# Patient Record
Sex: Male | Born: 1935 | Hispanic: No | Marital: Married | Smoking: Former smoker
Health system: Southern US, Community
[De-identification: ages and names within clinical notes are randomized; demographics above are authoritative.]

## PROBLEM LIST (undated history)

## (undated) DIAGNOSIS — R519 Headache, unspecified: Secondary | ICD-10-CM

## (undated) DIAGNOSIS — J189 Pneumonia, unspecified organism: Secondary | ICD-10-CM

## (undated) DIAGNOSIS — D649 Anemia, unspecified: Secondary | ICD-10-CM

## (undated) DIAGNOSIS — I2510 Atherosclerotic heart disease of native coronary artery without angina pectoris: Secondary | ICD-10-CM

## (undated) DIAGNOSIS — N400 Enlarged prostate without lower urinary tract symptoms: Secondary | ICD-10-CM

## (undated) DIAGNOSIS — M1990 Unspecified osteoarthritis, unspecified site: Secondary | ICD-10-CM

## (undated) DIAGNOSIS — N189 Chronic kidney disease, unspecified: Secondary | ICD-10-CM

## (undated) DIAGNOSIS — C801 Malignant (primary) neoplasm, unspecified: Secondary | ICD-10-CM

## (undated) DIAGNOSIS — I10 Essential (primary) hypertension: Secondary | ICD-10-CM

## (undated) DIAGNOSIS — R51 Headache: Secondary | ICD-10-CM

## (undated) HISTORY — DX: Anemia, unspecified: D64.9

## (undated) HISTORY — DX: Chronic kidney disease, unspecified: N18.9

## (undated) HISTORY — PX: SHOULDER SURGERY: SHX246

## (undated) HISTORY — PX: COLONOSCOPY W/ BIOPSIES AND POLYPECTOMY: SHX1376

## (undated) HISTORY — PX: TONSILLECTOMY: SUR1361

---

## 1999-07-10 ENCOUNTER — Ambulatory Visit (HOSPITAL_COMMUNITY): Admission: RE | Admit: 1999-07-10 | Discharge: 1999-07-10 | Payer: Self-pay | Admitting: Gastroenterology

## 2000-03-17 ENCOUNTER — Inpatient Hospital Stay (HOSPITAL_COMMUNITY): Admission: EM | Admit: 2000-03-17 | Discharge: 2000-03-20 | Payer: Self-pay | Admitting: Emergency Medicine

## 2000-03-17 ENCOUNTER — Encounter: Payer: Self-pay | Admitting: Emergency Medicine

## 2000-03-18 ENCOUNTER — Encounter: Payer: Self-pay | Admitting: Internal Medicine

## 2000-03-19 ENCOUNTER — Encounter: Payer: Self-pay | Admitting: Internal Medicine

## 2005-08-21 ENCOUNTER — Emergency Department (HOSPITAL_COMMUNITY): Admission: EM | Admit: 2005-08-21 | Discharge: 2005-08-21 | Payer: Self-pay | Admitting: Emergency Medicine

## 2005-08-21 ENCOUNTER — Encounter (INDEPENDENT_AMBULATORY_CARE_PROVIDER_SITE_OTHER): Payer: Self-pay | Admitting: Specialist

## 2005-08-21 ENCOUNTER — Ambulatory Visit (HOSPITAL_COMMUNITY): Admission: RE | Admit: 2005-08-21 | Discharge: 2005-08-21 | Payer: Self-pay | Admitting: General Surgery

## 2006-01-05 ENCOUNTER — Ambulatory Visit (HOSPITAL_COMMUNITY): Admission: RE | Admit: 2006-01-05 | Discharge: 2006-01-05 | Payer: Self-pay | Admitting: Gastroenterology

## 2006-01-05 ENCOUNTER — Encounter (INDEPENDENT_AMBULATORY_CARE_PROVIDER_SITE_OTHER): Payer: Self-pay | Admitting: *Deleted

## 2009-07-16 ENCOUNTER — Emergency Department (HOSPITAL_COMMUNITY): Admission: EM | Admit: 2009-07-16 | Discharge: 2009-07-16 | Payer: Self-pay | Admitting: Emergency Medicine

## 2010-10-21 LAB — COMPREHENSIVE METABOLIC PANEL
AST: 23 U/L (ref 0–37)
CO2: 25 mEq/L (ref 19–32)
Calcium: 9 mg/dL (ref 8.4–10.5)
Creatinine, Ser: 1.84 mg/dL — ABNORMAL HIGH (ref 0.4–1.5)
GFR calc Af Amer: 44 mL/min — ABNORMAL LOW (ref >60)
GFR calc non Af Amer: 36 mL/min — ABNORMAL LOW (ref >60)

## 2010-10-21 LAB — DIFFERENTIAL
Eosinophils Absolute: 0.5 10*3/uL (ref 0.0–0.7)
Eosinophils Relative: 3 % (ref 0–5)
Monocytes Absolute: 0.5 10*3/uL (ref 0.1–1.0)
Monocytes Relative: 3 % (ref 3–12)
Neutro Abs: 7.5 10*3/uL (ref 1.7–7.7)
Neutrophils Relative %: 45 % (ref 43–77)
nRBC: 0 /100 WBC

## 2010-10-21 LAB — URINALYSIS, ROUTINE W REFLEX MICROSCOPIC
Bilirubin Urine: NEGATIVE
Hgb urine dipstick: NEGATIVE
Ketones, ur: NEGATIVE mg/dL
Protein, ur: NEGATIVE mg/dL
Specific Gravity, Urine: 1.024 (ref 1.005–1.030)
Urobilinogen, UA: 1 mg/dL (ref 0.0–1.0)

## 2010-10-21 LAB — CBC
MCHC: 34 g/dL (ref 30.0–36.0)
MCV: 92.2 fL (ref 78.0–100.0)
RBC: 4.14 MIL/uL — ABNORMAL LOW (ref 4.22–5.81)
RDW: 13.4 % (ref 11.5–15.5)

## 2010-10-21 LAB — GLUCOSE, CAPILLARY: Glucose-Capillary: 176 mg/dL — ABNORMAL HIGH (ref 70–99)

## 2010-12-06 NOTE — Op Note (Signed)
NAME:  Ray Merritt, RACKHAM                ACCOUNT NO.:  [id number]   MEDICAL RECORD NO.:  [id number]          PATIENT TYPE:  AMB   LOCATION:  ENDO                         FACILITY:  [facility]   PHYSICIAN:  Nelwyn Salisbury, M.D.  DATE OF BIRTH:  May 26, 1936   DATE OF PROCEDURE:  01/05/2006  DATE OF DISCHARGE:                                 OPERATIVE REPORT   PROCEDURE PERFORMED:  Flexible sigmoidoscopy with multiple cold biopsies.   ENDOSCOPIST:  Nelwyn Salisbury, M.D.   INSTRUMENT USED:  Olympus video colonoscope.   INDICATIONS FOR PROCEDURE:  The patient is a 75 year old African-American  male with a history of transanal excision of a rectal mass with high grade  dysplasia undergoing surveillance flexible sigmoidoscopy.  The lesion was  removed in February of 2007.  Rule out colonic polyps, masses, etc.   PREPROCEDURE PREPARATION:  Informed consent was procured from the patient.  The patient was fasted for four hours prior to the procedure and prepped  with MiraLax the night prior to the procedure.  The risks and benefits were  discussed with the patient in great detail.   PREPROCEDURE PHYSICAL:  The patient had stable vital signs.  Neck supple.  Chest clear to auscultation. S1 and S2 regular.  Abdomen soft with normal  bowel sounds.   DESCRIPTION OF PROCEDURE:  The patient was placed in left lateral decubitus  position and sedated with 40 mcg of fentanyl and 4 mg of Versed in slow  incremental doses.  Once the patient was adequately sedated and maintained  on low flow oxygen and continuous cardiac monitoring, the Olympus video  colonoscope was advanced from the rectum to 80 cm without difficulty.  There  was some residual stool in the colon and multiple washes were done.  A well-  healed scar was noted in the rectum without any evidence of recurrence of  the lesion removed in the past.  four small sessile polyps were biopsied  from the rectum and four from the rectosigmoid colon by cold  biopsy.  Internal hemorrhoids were seen on retroflexion.  The rest of the exam was  unremarkable.  The patient tolerated the procedure well without  complication.   IMPRESSION:  1.Small internal hemorrhoids.  2.Four small sessile polyps biopsied from the rectum and four from the  rectosigmoid colon.  3.Otherwise normal exam up to 80 cm.   RECOMMENDATIONS:  1.Await pathology results.  2.Avoid all nonsteroidals including aspirin for the next two weeks.  3.Outpatient followup as need arises in the future.  4.Repeat colonoscopy will be planned depending on the biopsy results.      Nelwyn Salisbury, M.D.  Electronically Signed     JNM/MEDQ  D:  01/05/2006  T:  01/06/2006  Job:  [id number]   cc:   Royetta Crochet. Karlton Lemon, M.D.  Fax: [phone]   Odis Hollingshead, M.D.  [address], [place]  [place] [address]

## 2010-12-06 NOTE — H&P (Signed)
[name]  Patient:    Ray Merritt, Ray Merritt                        MRN: [id number] Adm. Date:  03/17/00 Attending:  Royetta Crochet. Karlton Lemon, M.D.                         History and Physical  CHIEF COMPLAINT: Shortness of breath, fever.  HISTORY OF PRESENT ILLNESS: Mr. Ray Merritt is a 75 year old admitted with a one week history of progressively worsening shortness of breath with a productive cough.  He noted a fever today up to 102 degrees and has also been having nausea and vomiting with diarrhea.  When he presented to the emergency department he was noted to be markedly diaphoretic with a temperature of 101 degrees and also dyspneic, with oxygen saturation in the low 90s.  His pO2, however, is 61, revealing hypoxia.  He also complains of mid sternal chest pressure, but denies palpitations.  He has no other acute constitutional or systemic complaints at this time.  ALLERGIES: No known drug allergies.  MEDICATIONS:  1. K-Dur 20 mEq 1 p.o. q.d.  2. Norvasc 5 mg 1 p.o. q.d.  3. Enalapril 10 mg 1 p.o. q.d.  4. Actos 30 mg 1 p.o. q.d.  5. Lipitor 10 mg 1 p.o. q.d.  6. Glucophage 1 g p.o. q.d.  7. Lasix 20 mg 1 p.o. q.d.  PAST MEDICAL HISTORY:  1. Hypertension.  2. Type 2 diabetes mellitus.  3. Hyperlipidemia.  SOCIAL HISTORY: Mr. Ray Merritt is married and lives in [place], [place]. He denies use of tobacco, alcohol, or drug abuse.  FAMILY HISTORY: Significant for hypertension and diabetes.  REVIEW OF SYSTEMS: Significant for progressively worsening left shoulder pain associated with left hand numbness and tingling.  PHYSICAL EXAMINATION:  GENERAL: Diaphoretic black gentleman, exhibiting difficulty breathing.  VITAL SIGNS: Temperature 101.3 degrees, blood pressure 164/76.  Oxygen saturation 98%.  HEENT: TMs within normal limits bilaterally.  Oropharynx with erythema but no lesions or exudate.  NECK: Supple, no masses.  Carotids 2+.  No  bruits.  LUNGS: Good air movement.  Clear to auscultation bilaterally.  HEART: S1 and S2.  Regular rate and rhythm with no murmurs, rubs, or gallops.  ABDOMEN: Soft, nontender, nondistended.  Positive bowel sounds.  EXTREMITIES: No clubbing, cyanosis, or edema.  Pulses 2+ throughout.  NEUROLOGIC: Alert and oriented x 3.  Cranial nerves intact.  LABORATORY DATA: Sodium 128, potassium 4.4, glucose 256.  BUN 32, creatinine 2.0.  Albumin 3.2.  SGOT 40, SGPT 50.  Troponin I 0.06.  CK/CK-MB 235/0.8. WBC 8.2, hemoglobin 12.0.  Chest x-ray unremarkable.  Urinalysis unremarkable.  ASSESSMENT/PLAN:  1. Shortness of breath with chest pain, hypoxia, and fever.  Mr. Rai     clinical presentation is consistent with pneumonia, although chest x-ray     is unremarkable.  Other concerns include pulmonary embolism considering     his pO2 is markedly decreased.  Since he has renal insufficiency a V/Q     scan will be obtained to rule out the possibility of pulmonary embolus.     At this time, however, he will continue nasal cannula oxygen     supplementation, intravenous antibiotics for pneumonia coverage, and     albuterol nebulizers, which hopefully will help improve his symptoms of     shortness of breath.  2. Mediastinal chest pain.  Mr. Sanangelo has significant cardiac risk  factors     including hyperlipidemia, hypertension, type 2 diabetes mellitus.  Initial     cardiac enzymes are unremarkable; however, the possibility of cardiac     etiology to explain his symptoms cannot be ruled out and therefore serial     troponin enzymes will be obtained.  Electrocardiogram today shows no     acute ischemic changes.  He will be closely monitored on telemetry as     well.  3. Anemia.  Mr. Toomes has no acute blood loss and denies any chronic     blood loss as well.  His stool will be guaiac tested during this     admission and if he needs further gastrointestinal consultation that     will be  pursued if needed. DD:  03/17/00 TD:  03/17/00 Job: [id number]

## 2010-12-06 NOTE — Op Note (Signed)
NAME:  Ray Merritt, Ray Merritt                ACCOUNT NO.:  [id number]   MEDICAL RECORD NO.:  [id number]          PATIENT TYPE:  AMB   LOCATION:  DAY                          [facility]ACILITY:  [facility]   PHYSICIAN:  Odis Hollingshead, M.D.DATE OF BIRTH:  1935-10-25   DATE OF PROCEDURE:  08/21/2005  DATE OF DISCHARGE:                                 OPERATIVE REPORT   PREOPERATIVE DIAGNOSIS:  Highly dysplastic rectal polyps.   POSTOPERATIVE DIAGNOSIS:  Highly dysplastic rectal polyps.   PROCEDURE:  Transanal excision of highly dysplastic rectal polyps (anterior  and posterior polyps).   SURGEON:  Dr. Zella Richer   ANESTHESIA:  General plus 0.5% Marcaine with epinephrine for local effect.   INDICATIONS:  This 75 year old male underwent colonoscopy and was noted has  some rectal polyps and irregularities in the rectal region.  The biopsy of  these showed some hyperplastic polyps but also adenomatous-type polyp with  highly dysplastic changes.  He has a palpable posterior polyp on examination  another polypoid area approximately 5 cm from the anal verge.  He now  presents for excision of those areas.   TECHNIQUE:  He is seen in the holding area then brought to the operating  room, placed on the operating table, and a general anesthetic was  administered.  He is then placed in the lithotomy position, and the perianal  area was sterilely prepped and draped.  Digital rectal exam confirmed the  posterior polyp.  An anoscope was inserted, exposing the posterior anal and  rectal mucosa.  The polyp was grasped, and then local anesthetic was  infiltrated in these tissue around the polyp.  Using electrocautery, the  posterior polyp was excised and sent to pathology.   Anoscopy was then performed of all of the areas, and I saw an anterior  irregularity consistent with a polypoid-type lesion.  I used electrocautery  to excise this mucosal lesion.  In examining the rest of the area by way of  anoscopy, I could  not see anything else that was suspicious.  The specimens  were sent separately to pathology.   Following this, hemostasis was noted be adequate.  Some Gelfoam was placed  into the rectal vault.  A sterile dressing was placed over the anal area.  He subsequently was extubated and taken to the recovery room in satisfactory  condition with no apparent complications.  He will be discharged to home  with discharge instructions and follow-up with me in the office in about 1-2  weeks.      Odis Hollingshead, M.D.  Electronically Signed     TJR/MEDQ  D:  08/21/2005  T:  08/21/2005  Job:  [id number]   cc:   Nelwyn Salisbury, M.D.  Fax: [phone]   [place] Karlton Lemon, M.D.  Fax: [phone]

## 2011-08-05 DIAGNOSIS — H4011X0 Primary open-angle glaucoma, stage unspecified: Secondary | ICD-10-CM | POA: Diagnosis not present

## 2011-08-05 DIAGNOSIS — H401290 Low-tension glaucoma, unspecified eye, stage unspecified: Secondary | ICD-10-CM | POA: Diagnosis not present

## 2011-08-21 DIAGNOSIS — N2581 Secondary hyperparathyroidism of renal origin: Secondary | ICD-10-CM | POA: Diagnosis not present

## 2011-09-08 DIAGNOSIS — E1129 Type 2 diabetes mellitus with other diabetic kidney complication: Secondary | ICD-10-CM | POA: Diagnosis not present

## 2011-09-08 DIAGNOSIS — Z79899 Other long term (current) drug therapy: Secondary | ICD-10-CM | POA: Diagnosis not present

## 2011-09-08 DIAGNOSIS — I10 Essential (primary) hypertension: Secondary | ICD-10-CM | POA: Diagnosis not present

## 2011-09-30 DIAGNOSIS — R310 Gross hematuria: Secondary | ICD-10-CM | POA: Diagnosis not present

## 2011-09-30 DIAGNOSIS — K5730 Diverticulosis of large intestine without perforation or abscess without bleeding: Secondary | ICD-10-CM | POA: Diagnosis not present

## 2011-09-30 DIAGNOSIS — N400 Enlarged prostate without lower urinary tract symptoms: Secondary | ICD-10-CM | POA: Diagnosis not present

## 2011-09-30 DIAGNOSIS — R972 Elevated prostate specific antigen [PSA]: Secondary | ICD-10-CM | POA: Diagnosis not present

## 2011-09-30 DIAGNOSIS — N401 Enlarged prostate with lower urinary tract symptoms: Secondary | ICD-10-CM | POA: Diagnosis not present

## 2011-09-30 DIAGNOSIS — I2510 Atherosclerotic heart disease of native coronary artery without angina pectoris: Secondary | ICD-10-CM | POA: Diagnosis not present

## 2011-10-07 ENCOUNTER — Emergency Department (HOSPITAL_COMMUNITY)
Admission: EM | Admit: 2011-10-07 | Discharge: 2011-10-07 | Disposition: A | Discharge status: Home / Self Care | Payer: Medicare Other | Attending: Emergency Medicine | Admitting: Emergency Medicine

## 2011-10-07 ENCOUNTER — Encounter (HOSPITAL_COMMUNITY): Payer: Self-pay | Admitting: *Deleted

## 2011-10-07 DIAGNOSIS — R339 Retention of urine, unspecified: Secondary | ICD-10-CM | POA: Diagnosis not present

## 2011-10-07 DIAGNOSIS — R3915 Urgency of urination: Secondary | ICD-10-CM | POA: Insufficient documentation

## 2011-10-07 DIAGNOSIS — R109 Unspecified abdominal pain: Secondary | ICD-10-CM | POA: Diagnosis not present

## 2011-10-07 HISTORY — DX: Benign prostatic hyperplasia without lower urinary tract symptoms: N40.0

## 2011-10-07 HISTORY — DX: Essential (primary) hypertension: I10

## 2011-10-07 LAB — URINE MICROSCOPIC-ADD ON

## 2011-10-07 LAB — URINALYSIS, ROUTINE W REFLEX MICROSCOPIC
Bilirubin Urine: NEGATIVE
Specific Gravity, Urine: 1.019 (ref 1.005–1.030)
pH: 5 (ref 5.0–8.0)

## 2011-10-07 MED ORDER — CIPROFLOXACIN HCL 250 MG PO TABS: 250.0000 mg | ORAL_TABLET | Freq: Two times a day (BID) | ORAL | Status: AC

## 2011-10-07 NOTE — ED Notes (Signed)
Pt reports urinary retention x 12 hours. Reports pressure in lower abdomen and pain to back. Denies nausea/vomiting.

## 2011-10-07 NOTE — Discharge Instructions (Signed)
Followup with your urologist tomorrow  Acute Urinary Retention, Male You have been seen by a caregiver today because of your inability to urinate (pass your water). This is a common problem in elderly males. As men age their prostates become larger and block the flow of urine from the bladder. This is usually a problem that has come on gradually. It is often first noticed by having to get up at night to urinate. This is because as the prostate enlarges it is more difficult to empty the bladder completely. Treatment may involve a one time catheterization to empty the bladder. This is putting in a tube to drain your urine. Then you and your personal caregiver can decide at your earliest convenience how to handle this problem in the future. It may also be a problem that may not recur for years. Sometimes this problem can be caused by medications. In this case, all that is often necessary is to discontinue the offending agent. If you are to leave the foley catheter (a long, narrow, hollow tube) in and go home with a drainage system, you will need to discuss the best course of action with your caregiver. While the catheter is in, maintain a good intake of fluids. Keep the drainage bag emptied and lower than your catheter. This is so contaminated (infected) urine will not be flowing back into your bladder. This could lead to a urinary tract infection. Only take over-the-counter or prescription medicines for pain, discomfort, or fever as directed by your caregiver.  SEEK IMMEDIATE MEDICAL CARE IF:  You develop chills, fever, or show signs of generalized illness that occurs prior to seeing your caregiver. Document Released: 10/13/2000 Document Revised: 06/26/2011 Document Reviewed: 06/28/2008 [facility] Patient Information 2012 [place], [place].Foley Catheter Care, Adult A soft, flexible tube (Foley catheter) has been placed in your bladder. This may be done to temporarily help with urine drainage after an  operation or to relieve blockage from an enlarged prostate gland. HOME CARE INSTRUCTIONS  If you are going home with a Foley catheter in place, follow these instructions: Taking Care of the Catheter:  Keep the area where the catheter leaves your body clean.   Attach the catheter to the leg so there is no tension on the catheter.   Keep the drainage bag below the level of the bladder, but keep it OFF the floor.   Do not take long soaking baths. Your caregiver will give instructions about showering.   Wash your hands before touching ANYTHING related to the catheter or bag.   Using mild soap and warm water on a washcloth:   Clean the area closest to the catheter insertion site using a circular motion around the catheter.   Clean the catheter itself by wiping AWAY from the insertion site for several inches down the tube.   NEVER wipe upward as this could sweep bacteria up into the urethra (tube in your body that normally drains the bladder) and cause infection.  Taking Care of the Drainage Bags:  Two drainage bags will be taken home: a large overnight drainage bag, and a smaller leg bag which fits underneath clothing.   It is okay to wear the overnight bag at any time, but NEVER wear the smaller leg bag at night.   Keep the drainage bag well below the level of your bladder. This prevents backflow of urine into the bladder and allows the urine to drain freely.   Anchor the tubing to your leg to prevent pulling or tension on  the catheter. Use tape or a leg strap provided by the hospital.   Empty the drainage bag when it is  to  full. Wash your hands before and after touching the bag.   Periodically check the tubing for kinks to make sure there is no pressure on the tubing which could restrict the flow of urine.  Changing the Drainage Bags:  Cleanse both ends of the clean bag with alcohol before changing.   Pinch off the rubber catheter to avoid urine spillage during the  disconnection.   Disconnect the dirty bag and connect the clean one.   Empty the dirty bag carefully to avoid a urine spill.   Attach the new bag to the leg with tape or a leg strap.  Cleaning the Drainage Bags:  Whenever a drainage bag is disconnected, it must be cleaned quickly so it is ready for the next use.   Wash the bag in warm, soapy water.   Rinse the bag thoroughly with warm water.   Soak the bag for 30 minutes in a solution of white vinegar and water (1 cup vinegar to 1 quart warm water).   Rinse with warm water.  SEEK MEDICAL CARE IF:   Some pain develops in the kidney (lower back) area.   The urine is cloudy or smells bad.   There is some blood in the urine.   The catheter becomes clogged and/or there is no urine drainage.  SEEK IMMEDIATE MEDICAL CARE IF:   You have moderate or severe pain in the kidney region.   You start to throw up (vomit).   Blood fills the tube.   Worsening belly (abdominal) pain develops.   You have a fever.  MAKE SURE YOU:   Understand these instructions.   Will watch your condition.   Will get help right away if you are not doing well or get worse.  Document Released: 07/07/2005 Document Revised: 06/26/2011 Document Reviewed: 01/01/2007 [facility] Patient Information 2012 [place], [place].

## 2011-10-07 NOTE — ED Provider Notes (Signed)
History     CSN: [id number]  Arrival date & time 10/07/11  1138   First MD Initiated Contact with Patient 10/07/11 1141      No chief complaint on file.   (Consider location/radiation/quality/duration/timing/severity/associated sxs/prior treatment) The history is provided by the patient and the spouse.   patient here complaining of urinary retention since 11:00 last night. Patient has had some urinary urgency with small amounts of urine noted. Denies any fever or back pain. No penile drainage or discharge. Does have a history of BPH and has had similar symptoms. No therapy done prior to arrival. Location of pain is suprapubic and described as pressure  No past medical history on file.  No past surgical history on file.  No family history on file.  History  Substance Use Topics  . Smoking status: Not on file  . Smokeless tobacco: Not on file  . Alcohol Use: Not on file      Review of Systems  All other systems reviewed and are negative.    Allergies  Review of patient's allergies indicates not on file.  Home Medications  No current outpatient prescriptions on file.  There were no vitals taken for this visit.  Physical Exam  Nursing note and vitals reviewed. Constitutional: He is oriented to person, place, and time. He appears well-developed and well-nourished.  Non-toxic appearance. No distress.  HENT:  Head: Normocephalic and atraumatic.  Eyes: Conjunctivae, EOM and lids are normal. Pupils are equal, round, and reactive to light.  Neck: Normal range of motion. Neck supple. No tracheal deviation present. No mass present.  Cardiovascular: Normal rate, regular rhythm and normal heart sounds.  Exam reveals no gallop.   No murmur heard. Pulmonary/Chest: Effort normal and breath sounds normal. No stridor. No respiratory distress. He has no decreased breath sounds. He has no wheezes. He has no rhonchi. He has no rales.  Abdominal: Soft. Normal appearance and bowel  sounds are normal. He exhibits no distension. There is tenderness in the suprapubic area. There is no rigidity, no rebound, no guarding and no CVA tenderness.  Musculoskeletal: Normal range of motion. He exhibits no edema and no tenderness.  Neurological: He is alert and oriented to person, place, and time. He has normal strength. No cranial nerve deficit or sensory deficit. GCS eye subscore is 4. GCS verbal subscore is 5. GCS motor subscore is 6.  Skin: Skin is warm and dry. No abrasion and no rash noted.  Psychiatric: He has a normal mood and affect. His speech is normal and behavior is normal.    ED Course  Procedures (including critical care time)   Labs Reviewed  URINALYSIS, ROUTINE W REFLEX MICROSCOPIC  URINE CULTURE   No results found.   No diagnosis found.    MDM  Patient had a Foley catheter placed by nursing and patient drained 800 cc of urine. Patient be treated for urinary tract infection and will see his urologist TOMORROW        Leota Jacobsen, MD 10/07/11 1425

## 2011-10-09 LAB — URINE CULTURE: Culture  Setup Time: 201303191404

## 2011-10-10 NOTE — ED Notes (Signed)
+   Urine -3,0000 Col[place] no need for treatment per protocol MD.

## 2011-10-13 DIAGNOSIS — R310 Gross hematuria: Secondary | ICD-10-CM | POA: Diagnosis not present

## 2011-10-13 DIAGNOSIS — N401 Enlarged prostate with lower urinary tract symptoms: Secondary | ICD-10-CM | POA: Diagnosis not present

## 2011-10-15 DIAGNOSIS — E7800 Pure hypercholesterolemia, unspecified: Secondary | ICD-10-CM | POA: Diagnosis not present

## 2011-10-15 DIAGNOSIS — R0989 Other specified symptoms and signs involving the circulatory and respiratory systems: Secondary | ICD-10-CM | POA: Diagnosis not present

## 2011-10-15 DIAGNOSIS — I2510 Atherosclerotic heart disease of native coronary artery without angina pectoris: Secondary | ICD-10-CM | POA: Diagnosis not present

## 2011-10-15 DIAGNOSIS — E1169 Type 2 diabetes mellitus with other specified complication: Secondary | ICD-10-CM | POA: Diagnosis not present

## 2011-10-20 DIAGNOSIS — E1169 Type 2 diabetes mellitus with other specified complication: Secondary | ICD-10-CM | POA: Diagnosis not present

## 2011-10-20 DIAGNOSIS — I2510 Atherosclerotic heart disease of native coronary artery without angina pectoris: Secondary | ICD-10-CM | POA: Diagnosis not present

## 2011-10-31 DIAGNOSIS — I2510 Atherosclerotic heart disease of native coronary artery without angina pectoris: Secondary | ICD-10-CM | POA: Diagnosis not present

## 2011-10-31 DIAGNOSIS — E1169 Type 2 diabetes mellitus with other specified complication: Secondary | ICD-10-CM | POA: Diagnosis not present

## 2011-11-03 DIAGNOSIS — H4011X0 Primary open-angle glaucoma, stage unspecified: Secondary | ICD-10-CM | POA: Diagnosis not present

## 2011-11-07 DIAGNOSIS — I2510 Atherosclerotic heart disease of native coronary artery without angina pectoris: Secondary | ICD-10-CM | POA: Diagnosis not present

## 2011-11-07 DIAGNOSIS — E7800 Pure hypercholesterolemia, unspecified: Secondary | ICD-10-CM | POA: Diagnosis not present

## 2011-11-07 DIAGNOSIS — E1169 Type 2 diabetes mellitus with other specified complication: Secondary | ICD-10-CM | POA: Diagnosis not present

## 2011-11-07 DIAGNOSIS — R0989 Other specified symptoms and signs involving the circulatory and respiratory systems: Secondary | ICD-10-CM | POA: Diagnosis not present

## 2011-12-09 DIAGNOSIS — I10 Essential (primary) hypertension: Secondary | ICD-10-CM | POA: Diagnosis not present

## 2011-12-09 DIAGNOSIS — E1165 Type 2 diabetes mellitus with hyperglycemia: Secondary | ICD-10-CM | POA: Diagnosis not present

## 2011-12-09 DIAGNOSIS — E785 Hyperlipidemia, unspecified: Secondary | ICD-10-CM | POA: Diagnosis not present

## 2011-12-09 DIAGNOSIS — Z79899 Other long term (current) drug therapy: Secondary | ICD-10-CM | POA: Diagnosis not present

## 2011-12-09 DIAGNOSIS — E1129 Type 2 diabetes mellitus with other diabetic kidney complication: Secondary | ICD-10-CM | POA: Diagnosis not present

## 2012-02-02 DIAGNOSIS — H4011X0 Primary open-angle glaucoma, stage unspecified: Secondary | ICD-10-CM | POA: Diagnosis not present

## 2012-04-15 DIAGNOSIS — R972 Elevated prostate specific antigen [PSA]: Secondary | ICD-10-CM | POA: Diagnosis not present

## 2012-04-15 DIAGNOSIS — N401 Enlarged prostate with lower urinary tract symptoms: Secondary | ICD-10-CM | POA: Diagnosis not present

## 2012-05-06 DIAGNOSIS — H4011X0 Primary open-angle glaucoma, stage unspecified: Secondary | ICD-10-CM | POA: Diagnosis not present

## 2012-05-11 DIAGNOSIS — E1129 Type 2 diabetes mellitus with other diabetic kidney complication: Secondary | ICD-10-CM | POA: Diagnosis not present

## 2012-05-11 DIAGNOSIS — E785 Hyperlipidemia, unspecified: Secondary | ICD-10-CM | POA: Diagnosis not present

## 2012-05-11 DIAGNOSIS — E559 Vitamin D deficiency, unspecified: Secondary | ICD-10-CM | POA: Diagnosis not present

## 2012-05-11 DIAGNOSIS — Z23 Encounter for immunization: Secondary | ICD-10-CM | POA: Diagnosis not present

## 2012-05-11 DIAGNOSIS — I10 Essential (primary) hypertension: Secondary | ICD-10-CM | POA: Diagnosis not present

## 2012-05-11 DIAGNOSIS — Z0000 Encounter for general adult medical examination without abnormal findings: Secondary | ICD-10-CM | POA: Diagnosis not present

## 2012-05-11 DIAGNOSIS — E1165 Type 2 diabetes mellitus with hyperglycemia: Secondary | ICD-10-CM | POA: Diagnosis not present

## 2012-06-01 DIAGNOSIS — E119 Type 2 diabetes mellitus without complications: Secondary | ICD-10-CM | POA: Diagnosis not present

## 2012-06-01 DIAGNOSIS — I10 Essential (primary) hypertension: Secondary | ICD-10-CM | POA: Diagnosis not present

## 2012-06-01 DIAGNOSIS — I2510 Atherosclerotic heart disease of native coronary artery without angina pectoris: Secondary | ICD-10-CM | POA: Diagnosis not present

## 2012-06-01 DIAGNOSIS — R0989 Other specified symptoms and signs involving the circulatory and respiratory systems: Secondary | ICD-10-CM | POA: Diagnosis not present

## 2012-11-02 DIAGNOSIS — H4011X0 Primary open-angle glaucoma, stage unspecified: Secondary | ICD-10-CM | POA: Diagnosis not present

## 2012-11-09 DIAGNOSIS — K625 Hemorrhage of anus and rectum: Secondary | ICD-10-CM | POA: Diagnosis not present

## 2012-11-09 DIAGNOSIS — I10 Essential (primary) hypertension: Secondary | ICD-10-CM | POA: Diagnosis not present

## 2012-11-09 DIAGNOSIS — E1165 Type 2 diabetes mellitus with hyperglycemia: Secondary | ICD-10-CM | POA: Diagnosis not present

## 2012-11-09 DIAGNOSIS — E785 Hyperlipidemia, unspecified: Secondary | ICD-10-CM | POA: Diagnosis not present

## 2012-11-09 DIAGNOSIS — Z79899 Other long term (current) drug therapy: Secondary | ICD-10-CM | POA: Diagnosis not present

## 2013-04-19 DIAGNOSIS — R972 Elevated prostate specific antigen [PSA]: Secondary | ICD-10-CM | POA: Diagnosis not present

## 2013-04-26 DIAGNOSIS — H4011X0 Primary open-angle glaucoma, stage unspecified: Secondary | ICD-10-CM | POA: Diagnosis not present

## 2013-06-07 DIAGNOSIS — Z0000 Encounter for general adult medical examination without abnormal findings: Secondary | ICD-10-CM | POA: Diagnosis not present

## 2013-06-07 DIAGNOSIS — I129 Hypertensive chronic kidney disease with stage 1 through stage 4 chronic kidney disease, or unspecified chronic kidney disease: Secondary | ICD-10-CM | POA: Diagnosis not present

## 2013-06-07 DIAGNOSIS — E669 Obesity, unspecified: Secondary | ICD-10-CM | POA: Diagnosis not present

## 2013-06-07 DIAGNOSIS — E785 Hyperlipidemia, unspecified: Secondary | ICD-10-CM | POA: Diagnosis not present

## 2013-06-07 DIAGNOSIS — N058 Unspecified nephritic syndrome with other morphologic changes: Secondary | ICD-10-CM | POA: Diagnosis not present

## 2013-06-07 DIAGNOSIS — E559 Vitamin D deficiency, unspecified: Secondary | ICD-10-CM | POA: Diagnosis not present

## 2013-06-07 DIAGNOSIS — E1129 Type 2 diabetes mellitus with other diabetic kidney complication: Secondary | ICD-10-CM | POA: Diagnosis not present

## 2013-07-26 DIAGNOSIS — E119 Type 2 diabetes mellitus without complications: Secondary | ICD-10-CM | POA: Diagnosis not present

## 2013-07-26 DIAGNOSIS — H2510 Age-related nuclear cataract, unspecified eye: Secondary | ICD-10-CM | POA: Diagnosis not present

## 2013-07-26 DIAGNOSIS — H4011X0 Primary open-angle glaucoma, stage unspecified: Secondary | ICD-10-CM | POA: Diagnosis not present

## 2013-07-26 DIAGNOSIS — H25019 Cortical age-related cataract, unspecified eye: Secondary | ICD-10-CM | POA: Diagnosis not present

## 2013-11-03 DIAGNOSIS — H40019 Open angle with borderline findings, low risk, unspecified eye: Secondary | ICD-10-CM | POA: Diagnosis not present

## 2013-12-05 DIAGNOSIS — N058 Unspecified nephritic syndrome with other morphologic changes: Secondary | ICD-10-CM | POA: Diagnosis not present

## 2013-12-05 DIAGNOSIS — E785 Hyperlipidemia, unspecified: Secondary | ICD-10-CM | POA: Diagnosis not present

## 2013-12-05 DIAGNOSIS — E1129 Type 2 diabetes mellitus with other diabetic kidney complication: Secondary | ICD-10-CM | POA: Diagnosis not present

## 2013-12-05 DIAGNOSIS — E559 Vitamin D deficiency, unspecified: Secondary | ICD-10-CM | POA: Diagnosis not present

## 2013-12-05 DIAGNOSIS — I129 Hypertensive chronic kidney disease with stage 1 through stage 4 chronic kidney disease, or unspecified chronic kidney disease: Secondary | ICD-10-CM | POA: Diagnosis not present

## 2013-12-05 DIAGNOSIS — N183 Chronic kidney disease, stage 3 unspecified: Secondary | ICD-10-CM | POA: Diagnosis not present

## 2013-12-05 DIAGNOSIS — E1165 Type 2 diabetes mellitus with hyperglycemia: Secondary | ICD-10-CM | POA: Diagnosis not present

## 2014-01-10 DIAGNOSIS — N183 Chronic kidney disease, stage 3 unspecified: Secondary | ICD-10-CM | POA: Diagnosis not present

## 2014-01-10 DIAGNOSIS — I10 Essential (primary) hypertension: Secondary | ICD-10-CM | POA: Diagnosis not present

## 2014-01-10 DIAGNOSIS — D72829 Elevated white blood cell count, unspecified: Secondary | ICD-10-CM | POA: Diagnosis not present

## 2014-01-10 DIAGNOSIS — M7989 Other specified soft tissue disorders: Secondary | ICD-10-CM | POA: Diagnosis not present

## 2014-01-11 ENCOUNTER — Telehealth: Payer: Self-pay | Admitting: Internal Medicine

## 2014-01-11 DIAGNOSIS — M7989 Other specified soft tissue disorders: Secondary | ICD-10-CM | POA: Diagnosis not present

## 2014-01-11 NOTE — Telephone Encounter (Signed)
LEFT MESSAGE FOR PATIENT TO RETURN CALL TO SCHEDULE NP APPT.  °

## 2014-01-12 ENCOUNTER — Telehealth: Payer: Self-pay | Admitting: Oncology

## 2014-01-12 NOTE — Telephone Encounter (Signed)
C/D 01/12/14 for appt. 01/27/14

## 2014-01-27 ENCOUNTER — Encounter: Payer: Self-pay | Admitting: Internal Medicine

## 2014-01-27 ENCOUNTER — Telehealth: Payer: Self-pay | Admitting: Internal Medicine

## 2014-01-27 ENCOUNTER — Other Ambulatory Visit: Payer: Self-pay | Admitting: Internal Medicine

## 2014-01-27 ENCOUNTER — Ambulatory Visit (HOSPITAL_BASED_OUTPATIENT_CLINIC_OR_DEPARTMENT_OTHER): Payer: Medicare Other | Admitting: Internal Medicine

## 2014-01-27 ENCOUNTER — Other Ambulatory Visit (HOSPITAL_COMMUNITY)
Admission: RE | Admit: 2014-01-27 | Discharge: 2014-01-27 | Disposition: A | Discharge status: Home / Self Care | Payer: Medicare Other | Source: Ambulatory Visit | Attending: Internal Medicine | Admitting: Internal Medicine

## 2014-01-27 ENCOUNTER — Ambulatory Visit: Payer: Medicare Other

## 2014-01-27 ENCOUNTER — Other Ambulatory Visit (HOSPITAL_BASED_OUTPATIENT_CLINIC_OR_DEPARTMENT_OTHER): Payer: Medicare Other

## 2014-01-27 VITALS — BP 172/73 | HR 64 | Temp 98.0°F | Resp 18 | Ht 75.0 in | Wt 249.6 lb

## 2014-01-27 DIAGNOSIS — D7289 Other specified disorders of white blood cells: Secondary | ICD-10-CM | POA: Diagnosis not present

## 2014-01-27 DIAGNOSIS — D72829 Elevated white blood cell count, unspecified: Secondary | ICD-10-CM

## 2014-01-27 DIAGNOSIS — D649 Anemia, unspecified: Secondary | ICD-10-CM

## 2014-01-27 DIAGNOSIS — N183 Chronic kidney disease, stage 3 unspecified: Secondary | ICD-10-CM

## 2014-01-27 DIAGNOSIS — D72820 Lymphocytosis (symptomatic): Secondary | ICD-10-CM | POA: Diagnosis not present

## 2014-01-27 DIAGNOSIS — E119 Type 2 diabetes mellitus without complications: Secondary | ICD-10-CM

## 2014-01-27 DIAGNOSIS — I129 Hypertensive chronic kidney disease with stage 1 through stage 4 chronic kidney disease, or unspecified chronic kidney disease: Secondary | ICD-10-CM

## 2014-01-27 LAB — CBC WITH DIFFERENTIAL/PLATELET
BASO%: 0.7 % (ref 0.0–2.0)
BASOS ABS: 0.1 10*3/uL (ref 0.0–0.1)
EOS%: 2.1 % (ref 0.0–7.0)
Eosinophils Absolute: 0.3 10*3/uL (ref 0.0–0.5)
HCT: 35.6 % — ABNORMAL LOW (ref 38.4–49.9)
HEMOGLOBIN: 11.6 g/dL — AB (ref 13.0–17.1)
LYMPH%: 61.7 % — ABNORMAL HIGH (ref 14.0–49.0)
MCH: 29.4 pg (ref 27.2–33.4)
MCHC: 32.6 g/dL (ref 32.0–36.0)
MCV: 90.2 fL (ref 79.3–98.0)
MONO#: 0.9 10*3/uL (ref 0.1–0.9)
MONO%: 5.2 % (ref 0.0–14.0)
NEUT#: 5 10*3/uL (ref 1.5–6.5)
NEUT%: 30.3 % — AB (ref 39.0–75.0)
Platelets: 269 10*3/uL (ref 140–400)
RBC: 3.95 10*6/uL — ABNORMAL LOW (ref 4.20–5.82)
RDW: 13.8 % (ref 11.0–14.6)
WBC: 16.4 10*3/uL — ABNORMAL HIGH (ref 4.0–10.3)
lymph#: 10.2 10*3/uL — ABNORMAL HIGH (ref 0.9–3.3)

## 2014-01-27 LAB — LACTATE DEHYDROGENASE (CC13): LDH: 205 U/L (ref 125–245)

## 2014-01-27 LAB — COMPREHENSIVE METABOLIC PANEL (CC13)
ALK PHOS: 85 U/L (ref 40–150)
ALT: 20 U/L (ref 0–55)
AST: 12 U/L (ref 5–34)
Albumin: 3.7 g/dL (ref 3.5–5.0)
Anion Gap: 8 mEq/L (ref 3–11)
BILIRUBIN TOTAL: 0.27 mg/dL (ref 0.20–1.20)
BUN: 35.2 mg/dL — AB (ref 7.0–26.0)
CO2: 21 mEq/L — ABNORMAL LOW (ref 22–29)
Calcium: 9.6 mg/dL (ref 8.4–10.4)
Chloride: 110 mEq/L — ABNORMAL HIGH (ref 98–109)
Creatinine: 2.2 mg/dL — ABNORMAL HIGH (ref 0.7–1.3)
GLUCOSE: 259 mg/dL — AB (ref 70–140)
Potassium: 5 mEq/L (ref 3.5–5.1)
SODIUM: 139 meq/L (ref 136–145)
Total Protein: 6.8 g/dL (ref 6.4–8.3)

## 2014-01-27 LAB — TECHNOLOGIST REVIEW

## 2014-01-27 LAB — CHCC SMEAR

## 2014-01-27 NOTE — Progress Notes (Signed)
Checked in new pt with no financial concerns. °

## 2014-01-27 NOTE — Progress Notes (Signed)
[facility] Telephone:(336) [phone]   [phone]  NEW PATIENT EVALUATION   Name: Ray Merritt Date: 01/27/2014 MRN: [id number] DOB: 1935-12-26  PCP: Salena Saner., MD   REFERRING PHYSICIAN: Salena Saner., MD  REASON FOR REFERRAL: Leukocytosis Nos   HISTORY OF PRESENT ILLNESS:Ray Merritt is a 78 y.o. male who is being referred for further evaluation of his leukocytosis nos from Dr. Erling Cruz of nephrology.  His last visit with Dr. Florene Glen was on the 23 rd of June.  He has classified stage 3 CKDz, etiology likely secondary to hypertensive nephropathy.  WBC was elevated during that visit to 17,100 with 11% lymphocytes.  He reports mildly elevated wbcs for years. He denies current smoking and quit at age 37 years old.  He smoked about 16 years a pack a day.  He denies requiring steroids.  His last visit with Dr. Karlton Lemon this past spring, he was started on antibiotics for a sinus infection with resolution. He also reports a history of BPH, Diabetes, hypertension and dyslipidemia.  He denies any recent hospitalizations.  He has had bilateral shoulder rotary cuff repair (2000, 2001) but no recent surgeries.   He reports that his last colonoscopy was in 2010 with Dr. Collene Mares.  CBC drawn on 01/10/2014 revealed WBC of 15.6, hemoglobin of 11.1, hematocrit of 32.4 and platelets of 225.  BMP revealed a creatinine of 1.84 with eGFR of 40 mL/min/1.73 and normal calcium of 10.1.  He had some swelling in his right calf during the visit with Dr. Florene Glen and duplex of right lower extremity was ordered and was negative for deep venous thrombosis.  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 He lives with his wife locally in [place], [place].  He reports that his sister died of cancer at age 54 (ovarian cancer).  His oldest brother died of throat cancer (he was a smoker).  He has three children who he reports are all healthy.      PAST MEDICAL HISTORY:  has a past  medical history of Enlarged prostate; Diabetes mellitus; and Hypertension.     PAST SURGICAL HISTORY: Past Surgical History  Procedure Laterality Date  . Shoulder surgery       CURRENT MEDICATIONS: has a current medication list which includes the following prescription(s): albiglutide, amlodipine, aspirin, atorvastatin, brimonidine, candesartan-hydrochlorothiazide, carvedilol, cholecalciferol, insulin nph human, multivitamin with minerals, niacin, travoprost (bak free), and vitamin c.   ALLERGIES: Review of patient's allergies indicates no known allergies.   SOCIAL HISTORY:  reports that he has never smoked. He does not have any smokeless tobacco history on file. He reports that he does not drink alcohol or use illicit drugs.   FAMILY HISTORY: He reports that his sister died of cancer at age 70 (ovarian cancer).  His oldest brother died of throat cancer (he was a smoker).    LABORATORY DATA:  Results for orders placed in visit on 01/27/14 (from the past 48 hour(s))  CBC WITH DIFFERENTIAL     Status: Abnormal   Collection Time    01/27/14  1:42 PM      Result Value Ref Range   WBC 16.4 (*) 4.0 - 10.3 10e3/uL   NEUT# 5.0  1.5 - 6.5 10e3/uL   HGB 11.6 (*) 13.0 - 17.1 g/dL   HCT 35.6 (*) 38.4 - 49.9 %   Platelets 269  140 - 400 10e3/uL   MCV 90.2  79.3 - 98.0 fL   MCH 29.4  27.2 -  33.4 pg   MCHC 32.6  32.0 - 36.0 g/dL   RBC 3.95 (*) 4.20 - 5.82 10e6/uL   RDW 13.8  11.0 - 14.6 %   lymph# 10.2 (*) 0.9 - 3.3 10e3/uL   MONO# 0.9  0.1 - 0.9 10e3/uL   Eosinophils Absolute 0.3  0.0 - 0.5 10e3/uL   Basophils Absolute 0.1  0.0 - 0.1 10e3/uL   NEUT% 30.3 (*) 39.0 - 75.0 %   LYMPH% 61.7 (*) 14.0 - 49.0 %   MONO% 5.2  0.0 - 14.0 %   EOS% 2.1  0.0 - 7.0 %   BASO% 0.7  0.0 - 2.0 %  [facility] SMEAR     Status: None   Collection Time    01/27/14  1:42 PM      Result Value Ref Range   Smear Result Smear Available    COMPREHENSIVE METABOLIC PANEL (PI95)     Status: Abnormal   Collection  Time    01/27/14  1:42 PM      Result Value Ref Range   Sodium 139  136 - 145 mEq/L   Potassium 5.0  3.5 - 5.1 mEq/L   Chloride 110 (*) 98 - 109 mEq/L   CO2 21 (*) 22 - 29 mEq/L   Glucose 259 (*) 70 - 140 mg/dl   BUN 35.2 (*) 7.0 - 26.0 mg/dL   Creatinine 2.2 (*) 0.7 - 1.3 mg/dL   Total Bilirubin 0.27  0.20 - 1.20 mg/dL   Alkaline Phosphatase 85  40 - 150 U/L   AST 12  5 - 34 U/L   ALT 20  0 - 55 U/L   Total Protein 6.8  6.4 - 8.3 g/dL   Albumin 3.7  3.5 - 5.0 g/dL   Calcium 9.6  8.4 - 10.4 mg/dL   Anion Gap 8  3 - 11 mEq/L       RADIOGRAPHY: No results found.     REVIEW OF SYSTEMS:  Constitutional: Denies fevers, chills or abnormal weight loss Eyes: Denies blurriness of vision Ears, nose, mouth, throat, and face: Denies mucositis or sore throat Respiratory: Denies cough, dyspnea or wheezes Cardiovascular: Denies palpitation, chest discomfort or lower extremity swelling Gastrointestinal:  Denies nausea, heartburn or change in bowel habits Skin: Denies abnormal skin rashes Lymphatics: Denies new lymphadenopathy or easy bruising Neurological:Denies numbness, tingling or new weaknesses Behavioral/Psych: Mood is stable, no new changes  All other systems were reviewed with the patient and are negative.  PHYSICAL EXAM:  height is '6\' 3"'  (1.905 m) and weight is 249 lb 9.6 oz (113.218 kg). His oral temperature is 98 F (36.7 C). His blood pressure is 172/73 and his pulse is 64. His respiration is 18 and oxygen saturation is 100%.    GENERAL:alert, no distress and comfortable well nourished and well developed.  SKIN: skin color, texture, turgor are normal, no rashes or significant lesions EYES: normal, Conjunctiva are pink and non-injected, sclera clear OROPHARYNX:no exudate, no erythema and lips, buccal mucosa, and tongue normal  NECK: supple, thyroid normal size, non-tender, without nodularity LYMPH:  no palpable lymphadenopathy in the cervical, axillary or inguinal LUNGS:  clear to auscultation and percussion with normal breathing effort HEART: regular rate & rhythm and no murmurs and no lower extremity edema ABDOMEN:abdomen soft, non-tender and normal bowel sounds Musculoskeletal:no cyanosis of digits and no clubbing  NEURO: alert & oriented x 3 with fluent speech, no focal motor/sensory deficits   IMPRESSION: Ray Merritt is a 78 y.o. male with  a history of    PLAN:  1.  Lymphocytosis concerning for CLL.  --We reviewed his labs drawn by his nephrologist and labs above with leukocytosis with a predominant of lympocytes. Smear reviewed with smudge cells as noted above.  We explained that we are concerned for CLL and will send for flow cytometry.  We provided him written information concerning CLL.  He denies early satiety, night sweats or lymphadenopathy.  He does also have a mild anemia.   2. Anemia, normocytic.  --His hemoglobin is 11.6.  He reports being due to repeat colonoscopy this year. His anemia is likely secondary to chronic kidney disease.  3. Chronic Kidney disease. --His creatinine clearance is 38.8 mL/min.  He is being followed by nephrology.  Likely due to #5.   4. DM2. --He is on albiglutide 50 mg weekly and humulin 30 units bid.   5. Hypertension.   --He is on norvasc 10 mg daily; Atacand HCT daily; Coreg 6.25 mg daily.   All questions were answered. The patient knows to call the clinic with any problems, questions or concerns. We can certainly see the patient much sooner if necessary.  I spent 25 minutes counseling the patient face to face. The total time spent in the appointment was 45 minutes.    Cathie Bonnell, MD 01/27/2014 2:38 PM

## 2014-01-27 NOTE — Telephone Encounter (Signed)
gv adn printed appt sched anda vs for pt for Aug °

## 2014-01-27 NOTE — Patient Instructions (Addendum)
Chronic Lymphocytic Leukemia Chronic lymphocytic leukemia (CLL) is a type of cancer of the bone marrow and blood cells. Bone marrow is the soft, spongy tissue inside your bone. In CLL, the bone marrow makes too many white blood cells that usually fight infection in the body (lymphocytes). CLL usually gets worse slowly and is the most common type of adult leukemia.  RISK FACTORS No one knows the exact cause of CLL. There is a higher risk of CLL in people who:   Are older than 50 years.  Are white.  Are male.  Have a family history of CLL or other cancers of the lymph system.  Are of Russian Jewish or Eastern European Jewish descent.  Have been exposed to certain chemicals, such as Agent Orange (used in the Vietnam War) or other herbicides or insecticides. SYMPTOMS  At first, there may be no symptoms of chronic lymphocytic leukemia. After a while, some symptoms may occur, such as:   Feeling more tired than usual, even after rest.  Unplanned weight loss.  Heavy sweating at night.  Fevers.  Shortness of breath.  Decreased energy.  Paleness.  Painless, swollen lymph nodes.  A feeling of fullness in the upper left part of the abdomen.  Easy bruising or bleeding.  More frequent infections. DIAGNOSIS  Your health care provider may perform the following exams and tests to diagnose CLL:  Physical exam to check for an enlarged spleen, liver, or lymph nodes.  Blood and bone marrow tests to identify the presence of cancer cells. These may include tests such as complete blood count, flow cytometry, immunophenotyping, and fluorescence in situ hybridization (FISH).  CT scan to look for swelling or abnormalities in your spleen, liver, and lymph nodes. TREATMENT  Treatment options for CLL depend on the stage and the presence of symptoms. There are a number of types of treatment used for this condition, including:  Observation.  Targeted drugs. These are drugs that interfere with  chemicals that leukemia cells need in order to grow and multiply. They identify and attack specific cancer cells without harming normal cells.  Chemotherapy drugs. These medicines kill cells that are multiplying quickly, such as leukemia cells.  Radiation.  Surgery to remove the spleen.  Biological therapy. This treatment boosts the ability of your own immune system to fight the leukemia cells.  Bone marrow or peripheral blood stem cell transplant. This treatment allows the patient to receive very high doses of chemotherapy and/or radiation. These high doses kill the cancer cells but also destroy the bone marrow. After treatment is complete, you are given donor bone marrow or stem cells, which will replace the bone marrow. HOME CARE INSTRUCTIONS   Because you have an increased risk of infection, practice good hand washing and avoid being around people who are ill or being in crowded places.  Because you have an increased risk of bleeding and bruising, avoid contact sports or other rough activities.  Only take over-the-counter or prescription medicines for pain, discomfort, or fever as directed by your health care provider.  Although some of your treatments might affect your appetite, try to eat regular, healthy meals.  If you develop any side effects, such as nausea, diarrhea, rash, white patches in your mouth, a sore throat, difficulty swallowing, or severe fatigue, tell your health care provider. He or she may have recommendations of things you can do to improve symptoms.  Consider learning some ways to cope with the stress of having a chronic illness, such as yoga, meditation,   or participating in a support group. SEEK MEDICAL CARE IF:  You develop chest pains.  You notice pain, swelling or redness anywhere in your legs.  You have pain in your belly (abdomen).  You develop new bruises that are getting bigger.  You have painful or more swollen lymph nodes.  You develop bleeding  from your gums, nose, or in your urine or stools.  You are unable to stop throwing up (vomiting).  You cannot keep liquids down.  You feel lightheaded.  You have a fever or persistent symptoms for more than 2-3 days.  You develop a severe stiff neck or headache. SEEK IMMEDIATE MEDICAL CARE IF:  You have trouble breathing or feel short of breath.  You faint. Document Released: 11/23/2008 Document Revised: 03/09/2013 Document Reviewed: 12/30/2012 [facility] Patient Information 2015 [place], [place]. This information is not intended to replace advice given to you by your health care provider. Make sure you discuss any questions you have with your health care provider.  Leukocytosis Leukocytosis means you have more white blood cells than normal. White blood cells are made in your bone marrow. The main job of white blood cells is to fight infection. Having too many white blood cells is a common condition. It can develop as a result of many types of medical problems. CAUSES  In some cases, your bone marrow may be normal, but it is still making too many white blood cells. This could be the result of:  Infection.  Injury.  Physical stress.  Emotional stress.  Surgery.  Allergic reactions.  Tumors that do not start in the blood or bone marrow.  An inherited disease.  Certain medicines.  Pregnancy and labor. In other cases, you may have a bone marrow disorder that is causing your body to make too many white blood cells. Bone marrow disorders include:  Leukemia. This is a type of blood cancer.  Myeloproliferative disorders. These disorders cause blood cells to grow abnormally. SYMPTOMS  Some people have no symptoms. Others have symptoms due to the medical problem that is causing their leukocytosis. These symptoms may include:  Bleeding.  Bruising.  Fever.  Night sweats.  Repeated infections.  Weakness.  Weight loss. DIAGNOSIS  Leukocytosis is often found during  blood tests that are done as part of a normal physical exam. Your caregiver will probably order other tests to help determine why you have too many white blood cells. These tests may include:  A complete blood count (CBC). This test measures all the types of blood cells in your body.  Chest X-rays, urine tests (urinalysis), or other tests to look for signs of infection.  Bone marrow aspiration. For this test, a needle is put into your bone. Cells from the bone marrow are removed through the needle. The cells are then examined under a microscope. TREATMENT  Treatment is usually not needed for leukocytosis. However, if a disorder is causing your leukocytosis, it will need to be treated. Treatment may include:  Antibiotic medicines if you have a bacterial infection.  Bone marrow transplant. Your diseased bone marrow is replaced with healthy cells that will grow new bone marrow.  Chemotherapy. This is the use of drugs to kill cancer cells. HOME CARE INSTRUCTIONS  Only take over-the-counter or prescription medicines as directed by your caregiver.  Maintain a healthy weight. Ask your caregiver what weight is best for you.  Eat foods that are low in saturated fats and high in fiber. Eat plenty of fruits and vegetables.  Drink enough  fluids to keep your urine clear or pale yellow.  Get 30 minutes of exercise at least 5 times a week. Check with your caregiver before starting a new exercise routine.  Limit caffeine and alcohol.  Do not smoke.  Keep all follow-up appointments as directed by your caregiver. SEEK MEDICAL CARE IF:  You feel weak or more tired than usual.  You develop chills, a cough, or nasal congestion.  You lose weight without trying.  You have night sweats.  You bruise easily. SEEK IMMEDIATE MEDICAL CARE IF:  You bleed more than normal.  You have chest pain.  You have trouble breathing.  You have a fever.  You have uncontrolled nausea or vomiting.  You  feel dizzy or lightheaded. MAKE SURE YOU:  Understand these instructions.  Will watch your condition.  Will get help right away if you are not doing well or get worse. Document Released: 06/26/2011 Document Revised: 09/29/2011 Document Reviewed: 06/26/2011 [facility] Patient Information 2015 [place], [place]. This information is not intended to replace advice given to you by your health care provider. Make sure you discuss any questions you have with your health care provider.

## 2014-02-01 LAB — FLOW CYTOMETRY

## 2014-02-02 DIAGNOSIS — H4011X0 Primary open-angle glaucoma, stage unspecified: Secondary | ICD-10-CM | POA: Diagnosis not present

## 2014-02-06 ENCOUNTER — Telehealth: Payer: Self-pay | Admitting: Internal Medicine

## 2014-02-06 NOTE — Telephone Encounter (Signed)
Wanted to inform him of CLL results.  He has follow up in August.

## 2014-02-17 ENCOUNTER — Telehealth: Payer: Self-pay | Admitting: Internal Medicine

## 2014-02-17 NOTE — Telephone Encounter (Signed)
, °

## 2014-02-24 ENCOUNTER — Ambulatory Visit: Payer: Medicare Other

## 2014-02-24 ENCOUNTER — Other Ambulatory Visit: Payer: Medicare Other

## 2014-03-02 ENCOUNTER — Other Ambulatory Visit (HOSPITAL_BASED_OUTPATIENT_CLINIC_OR_DEPARTMENT_OTHER): Payer: Medicare Other

## 2014-03-02 ENCOUNTER — Telehealth: Payer: Self-pay | Admitting: Internal Medicine

## 2014-03-02 ENCOUNTER — Ambulatory Visit (HOSPITAL_BASED_OUTPATIENT_CLINIC_OR_DEPARTMENT_OTHER): Payer: Medicare Other | Admitting: Internal Medicine

## 2014-03-02 VITALS — BP 154/59 | HR 62 | Temp 98.3°F | Resp 18 | Ht 75.0 in | Wt 246.1 lb

## 2014-03-02 DIAGNOSIS — I10 Essential (primary) hypertension: Secondary | ICD-10-CM | POA: Diagnosis not present

## 2014-03-02 DIAGNOSIS — N189 Chronic kidney disease, unspecified: Secondary | ICD-10-CM

## 2014-03-02 DIAGNOSIS — D72829 Elevated white blood cell count, unspecified: Secondary | ICD-10-CM

## 2014-03-02 DIAGNOSIS — D649 Anemia, unspecified: Secondary | ICD-10-CM | POA: Diagnosis not present

## 2014-03-02 DIAGNOSIS — E119 Type 2 diabetes mellitus without complications: Secondary | ICD-10-CM

## 2014-03-02 DIAGNOSIS — C9110 Chronic lymphocytic leukemia of B-cell type not having achieved remission: Secondary | ICD-10-CM | POA: Diagnosis not present

## 2014-03-02 LAB — CBC WITH DIFFERENTIAL/PLATELET
BASO%: 0.5 % (ref 0.0–2.0)
Basophils Absolute: 0.1 10*3/uL (ref 0.0–0.1)
EOS ABS: 0.4 10*3/uL (ref 0.0–0.5)
EOS%: 2.6 % (ref 0.0–7.0)
HCT: 34.9 % — ABNORMAL LOW (ref 38.4–49.9)
HGB: 11.2 g/dL — ABNORMAL LOW (ref 13.0–17.1)
LYMPH%: 59.1 % — ABNORMAL HIGH (ref 14.0–49.0)
MCH: 28.9 pg (ref 27.2–33.4)
MCHC: 32.1 g/dL (ref 32.0–36.0)
MCV: 90.1 fL (ref 79.3–98.0)
MONO#: 0.9 10*3/uL (ref 0.1–0.9)
MONO%: 5.9 % (ref 0.0–14.0)
NEUT%: 31.9 % — ABNORMAL LOW (ref 39.0–75.0)
NEUTROS ABS: 4.7 10*3/uL (ref 1.5–6.5)
PLATELETS: 259 10*3/uL (ref 140–400)
RBC: 3.87 10*6/uL — AB (ref 4.20–5.82)
RDW: 13.6 % (ref 11.0–14.6)
WBC: 14.7 10*3/uL — ABNORMAL HIGH (ref 4.0–10.3)
lymph#: 8.7 10*3/uL — ABNORMAL HIGH (ref 0.9–3.3)

## 2014-03-02 LAB — TECHNOLOGIST REVIEW

## 2014-03-02 NOTE — Progress Notes (Signed)
[facility] OFFICE PROGRESS NOTE  Salena Saner., MD [address] [place] [address]  DIAGNOSIS: CLL (chronic lymphocytic leukemia) - Plan: CBC with Differential, Comprehensive metabolic panel (Cmet) - [facility], Lactate dehydrogenase (LDH) - [facility]  Chief Complaint  Patient presents with  . Follow-up    CURRENT TREATMENT: Observation.   INTERVAL HISTORY: Ray Merritt 78 y.o. male with a history of newly diagnosed CLL is here for follow up.  He was initially seen by me on 01/27/2014.  Today, he is accompanied by his wife Ray Merritt.   His last visit with Dr. Florene Glen was on the 23 rd of June. He has classified stage 3 CKDz, etiology likely secondary to hypertensive nephropathy. WBC was elevated during that visit to 17,100 with 11% lymphocytes. He reports mildly elevated wbcs for years. He denies current smoking and quit at age 69 years old. He smoked about 16 years a pack a day. He denies requiring steroids. His last visit with Dr. Karlton Lemon this past spring, he was started on antibiotics for a sinus infection with resolution. He also reports a history of BPH, Diabetes, hypertension and dyslipidemia.  He denies any recent hospitalizations. He has had bilateral shoulder rotary cuff repair (2000, 2001) but no recent surgeries. He reports that his last colonoscopy was in 2010 with Dr. Collene Mares. CBC drawn on 01/10/2014 revealed WBC of 15.6, hemoglobin of 11.1, hematocrit of 32.4 and platelets of 225. BMP revealed a creatinine of 1.84 with eGFR of 40 mL/min/1.73 and normal calcium of 10.1. He had some swelling in his right calf during the visit with Dr. Florene Glen and duplex of right lower extremity was ordered and was negative for deep venous thrombosis. 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 He reports that his sister died of cancer at age 84 (ovarian cancer). His oldest brother died of throat cancer (he was a smoker). He  has three children who he reports are all healthy.   MEDICAL HISTORY: Past Medical History  Diagnosis Date  . Enlarged prostate   . Diabetes mellitus   . Hypertension     INTERIM HISTORY: has Leukocytosis, unspecified and CLL (chronic lymphocytic leukemia) on his problem list.    ALLERGIES:  has No Known Allergies.  MEDICATIONS: has a current medication list which includes the following prescription(s): albiglutide, amlodipine, aspirin, atorvastatin, brimonidine, candesartan-hydrochlorothiazide, carvedilol, cholecalciferol, insulin nph human, multivitamin with minerals, niacin, travoprost (bak free), and vitamin c.  SURGICAL HISTORY:  Past Surgical History  Procedure Laterality Date  . Shoulder surgery      REVIEW OF SYSTEMS:   Constitutional: Denies fevers, chills or abnormal weight loss Eyes: Denies blurriness of vision Ears, nose, mouth, throat, and face: Denies mucositis or sore throat Respiratory: Denies cough, dyspnea or wheezes Cardiovascular: Denies palpitation, chest discomfort or lower extremity swelling Gastrointestinal:  Denies nausea, heartburn or change in bowel habits Skin: Denies abnormal skin rashes Lymphatics: Denies new lymphadenopathy or easy bruising Neurological:Denies numbness, tingling or new weaknesses Behavioral/Psych: Mood is stable, no new changes  All other systems were reviewed with the patient and are negative.  PHYSICAL EXAMINATION: ECOG PERFORMANCE STATUS: 0 - Asymptomatic  Blood pressure 154/59, pulse 62, temperature 98.3 F (36.8 C), temperature source Oral, resp. rate 18, height _0  (1.905 m), weight 246 lb 1.6 oz (111.63 kg).  GENERAL:alert, no distress and comfortable; well developed and well nourished.  SKIN: skin color, texture, turgor are normal, no rashes or significant  lesions EYES: normal, Conjunctiva are pink and non-injected, sclera clear OROPHARYNX:no exudate, no erythema and lips, buccal mucosa, and tongue normal  NECK:  supple, thyroid normal size, non-tender, without nodularity LYMPH:  no palpable lymphadenopathy in the cervical, axillary or supraclavicular LUNGS: clear to auscultation with normal breathing effort, no wheezes or rhonchi HEART: regular rate & rhythm and no murmurs and no lower extremity edema ABDOMEN:abdomen soft, non-tender and normal bowel sounds Musculoskeletal:no cyanosis of digits and no clubbing  NEURO: alert & oriented x 3 with fluent speech, no focal motor/sensory deficits  Labs:  Lab Results  Component Value Date   WBC 14.7* 03/02/2014   HGB 11.2* 03/02/2014   HCT 34.9* 03/02/2014   MCV 90.1 03/02/2014   PLT 259 03/02/2014   NEUTROABS 4.7 03/02/2014      Chemistry      Component Value Date/Time   NA 139 01/27/2014 1342   NA 136 07/16/2009 1108   K 5.0 01/27/2014 1342   K 4.6 07/16/2009 1108   CL 102 07/16/2009 1108   CO2 21* 01/27/2014 1342   CO2 25 07/16/2009 1108   BUN 35.2* 01/27/2014 1342   BUN 34* 07/16/2009 1108   CREATININE 2.2* 01/27/2014 1342   CREATININE 1.84* 07/16/2009 1108      Component Value Date/Time   CALCIUM 9.6 01/27/2014 1342   CALCIUM 9.0 07/16/2009 1108   ALKPHOS 85 01/27/2014 1342   ALKPHOS 64 07/16/2009 1108   AST 12 01/27/2014 1342   AST 23 07/16/2009 1108   ALT 20 01/27/2014 1342   ALT 29 07/16/2009 1108   BILITOT 0.27 01/27/2014 1342   BILITOT 0.6 07/16/2009 1108       Basic Metabolic Panel: No results found for this basename: NA, K, CL, CO2, GLUCOSE, BUN, CREATININE, CALCIUM, MG, PHOS,  in the last 168 hours GFR Estimated Creatinine Clearance: 38.5 ml/min (by C-G formula based on Cr of 2.2). Liver Function Tests: No results found for this basename: AST, ALT, ALKPHOS, BILITOT, PROT, ALBUMIN,  in the last 168 hours No results found for this basename: LIPASE, AMYLASE,  in the last 168 hours No results found for this basename: AMMONIA,  in the last 168 hours Coagulation profile No results found for this basename: INR, PROTIME,  in the last  168 hours  CBC:  Recent Labs Lab 03/02/14 1415  WBC 14.7*  NEUTROABS 4.7  HGB 11.2*  HCT 34.9*  MCV 90.1  PLT 259    Anemia work up No results found for this basename: VITAMINB12, FOLATE, FERRITIN, TIBC, IRON, RETICCTPCT,  in the last 72 hours  Studies:  No results found.  Pathology:  Interpretation Peripheral Blood Flow Cytometry - FINDINGS CONSISTENT WITH CHRONIC LYMPHOCYTIC LEUKEMIA. Susanne Greenhouse MD Pathologist, Electronic Signature (Case signed 01/30/2014)  RADIOGRAPHIC STUDIES: No results found.  ASSESSMENT: Ray Merritt 78 y.o. male with a history of CLL (chronic lymphocytic leukemia) - Plan: CBC with Differential, Comprehensive metabolic panel (Cmet) - [facility], Lactate dehydrogenase (LDH) - [facility]   PLAN:  1. CLL, stage 0.  --We reviewed his labs drawn by his nephrologist and labs above with leukocytosis with a predominant of lympocytes. Smear reviewed with smudge cells. We sent his peripheral blood for flow cytometry consistent with CLL.  We provided a detail handout on CLL staging and diagnosis.   He denies early satiety, night sweats or lymphadenopathy. He does also have a mild anemia. His WBC today is 14.7 down from 16.4 on 01/27/2014.     2. Anemia, normocytic.  --His  hemoglobin is 11.2. He reports being due to repeat colonoscopy this year. His anemia is likely secondary to chronic kidney disease.   3. Chronic Kidney disease.  --His creatinine clearance is 38.8 mL/min. He is being followed by nephrology. Likely due to #5.   4. DM2.  --He is on albiglutide 50 mg weekly and humulin 30 units bid.   5. Hypertension.  --He is on norvasc 10 mg daily; Atacand HCT daily; Coreg 6.25 mg daily.   6. Follow up.  --Patient will follow up in 3 months for repeat CBC, Chemistries and LDH.   All questions were answered. The patient knows to call the clinic with any problems, questions or concerns. We can certainly see the patient much sooner if necessary.  I spent 15  minutes counseling the patient face to face. The total time spent in the appointment was 25 minutes.  Marjan Rosman, MD 03/02/2014 3:13 PM

## 2014-03-02 NOTE — Telephone Encounter (Signed)
gv pt appt schedule for november.

## 2014-03-02 NOTE — Patient Instructions (Signed)
Chronic Lymphocytic Leukemia  Chronic lymphocytic leukemia (CLL) is a type of cancer of the bone marrow and blood cells. Bone marrow is the soft, spongy tissue inside your bone. In CLL, the bone marrow makes too many white blood cells that usually fight infection in the body (lymphocytes). CLL usually gets worse slowly and is the most common type of adult leukemia.   RISK FACTORS  No one knows the exact cause of CLL. There is a higher risk of CLL in people who:   · Are older than 50 years.  · Are white.  · Are male.  · Have a family history of CLL or other cancers of the lymph system.  · Are of Russian Jewish or Eastern European Jewish descent.  · Have been exposed to certain chemicals, such as Agent Orange (used in the Vietnam War) or other herbicides or insecticides.  SYMPTOMS   At first, there may be no symptoms of chronic lymphocytic leukemia. After a while, some symptoms may occur, such as:   · Feeling more tired than usual, even after rest.  · Unplanned weight loss.  · Heavy sweating at night.  · Fevers.  · Shortness of breath.  · Decreased energy.  · Paleness.  · Painless, swollen lymph nodes.  · A feeling of fullness in the upper left part of the abdomen.  · Easy bruising or bleeding.  · More frequent infections.  DIAGNOSIS   Your health care provider may perform the following exams and tests to diagnose CLL:  · Physical exam to check for an enlarged spleen, liver, or lymph nodes.  · Blood and bone marrow tests to identify the presence of cancer cells. These may include tests such as complete blood count, flow cytometry, immunophenotyping, and fluorescence in situ hybridization (FISH).  · CT scan to look for swelling or abnormalities in your spleen, liver, and lymph nodes.  TREATMENT   Treatment options for CLL depend on the stage and the presence of symptoms. There are a number of types of treatment used for this condition, including:  · Observation.  · Targeted drugs. These are drugs that interfere with  chemicals that leukemia cells need in order to grow and multiply. They identify and attack specific cancer cells without harming normal cells.  · Chemotherapy drugs. These medicines kill cells that are multiplying quickly, such as leukemia cells.  · Radiation.  · Surgery to remove the spleen.  · Biological therapy. This treatment boosts the ability of your own immune system to fight the leukemia cells.  · Bone marrow or peripheral blood stem cell transplant. This treatment allows the patient to receive very high doses of chemotherapy or radiation or both. These high doses kill the cancer cells but also destroy the bone marrow. After treatment is complete, you are given donor bone marrow or stem cells, which will replace the bone marrow.  HOME CARE INSTRUCTIONS   · Because you have an increased risk of infection, practice good hand washing and avoid being around people who are ill or being in crowded places.  · Because you have an increased risk of bleeding and bruising, avoid contact sports or other rough activities.  · Take medicines only directed by your health care provider.  · Although some of your treatments might affect your appetite, try to eat regular, healthy meals.  · If you develop any side effects, such as nausea, diarrhea, rash, white patches in your mouth, a sore throat, difficulty swallowing, or severe fatigue, tell your health   care provider. He or she may have recommendations of things you can do to improve symptoms.  · Consider learning some ways to cope with the stress of having a chronic illness, such as yoga, meditation, or participating in a support group.  SEEK MEDICAL CARE IF:  · You develop chest pains.  · You notice pain, swelling or redness anywhere in your legs.  · You have pain in your belly (abdomen).  · You develop new bruises that are getting bigger.  · You have painful or more swollen lymph nodes.  · You develop bleeding from your gums, nose, or in your urine or stools.  · You are  unable to stop throwing up (vomiting).  · You cannot keep liquids down.  · You feel light-headed.  · You have a fever.  · You develop a severe stiff neck or headache.  SEEK IMMEDIATE MEDICAL CARE IF:  · You have trouble breathing or feel short of breath.  · You faint.  Document Released: 11/23/2008 Document Revised: 11/21/2013 Document Reviewed: 12/30/2012  ExitCare® Patient Information ©2015 [employer]. This information is not intended to replace advice given to you by your health care provider. Make sure you discuss any questions you have with your health care provider.

## 2014-03-14 ENCOUNTER — Other Ambulatory Visit (HOSPITAL_COMMUNITY): Payer: Self-pay | Admitting: Sports Medicine

## 2014-03-14 ENCOUNTER — Ambulatory Visit (HOSPITAL_COMMUNITY)
Admission: RE | Admit: 2014-03-14 | Discharge: 2014-03-14 | Disposition: A | Discharge status: Home / Self Care | Payer: Medicare Other | Source: Ambulatory Visit | Attending: Cardiology | Admitting: Cardiology

## 2014-03-14 DIAGNOSIS — M7989 Other specified soft tissue disorders: Secondary | ICD-10-CM

## 2014-03-14 DIAGNOSIS — M79661 Pain in right lower leg: Secondary | ICD-10-CM

## 2014-03-14 DIAGNOSIS — M79662 Pain in left lower leg: Secondary | ICD-10-CM

## 2014-03-14 DIAGNOSIS — R609 Edema, unspecified: Secondary | ICD-10-CM | POA: Diagnosis not present

## 2014-03-14 DIAGNOSIS — M79609 Pain in unspecified limb: Secondary | ICD-10-CM | POA: Insufficient documentation

## 2014-03-14 NOTE — Progress Notes (Signed)
Right Lower Ext. Venous Duplex Completed. Negative for DVT and SVT. Dalayla Aldredge, BS, RDMS, RVT  

## 2014-03-16 ENCOUNTER — Telehealth (HOSPITAL_COMMUNITY): Payer: Self-pay | Admitting: *Deleted

## 2014-03-24 ENCOUNTER — Telehealth (HOSPITAL_COMMUNITY): Payer: Self-pay | Admitting: *Deleted

## 2014-04-06 DIAGNOSIS — M79609 Pain in unspecified limb: Secondary | ICD-10-CM | POA: Diagnosis not present

## 2014-04-06 DIAGNOSIS — R609 Edema, unspecified: Secondary | ICD-10-CM | POA: Diagnosis not present

## 2014-04-11 DIAGNOSIS — N183 Chronic kidney disease, stage 3 unspecified: Secondary | ICD-10-CM | POA: Diagnosis not present

## 2014-04-11 DIAGNOSIS — C9110 Chronic lymphocytic leukemia of B-cell type not having achieved remission: Secondary | ICD-10-CM | POA: Diagnosis not present

## 2014-04-11 DIAGNOSIS — E1142 Type 2 diabetes mellitus with diabetic polyneuropathy: Secondary | ICD-10-CM | POA: Diagnosis not present

## 2014-04-11 DIAGNOSIS — E1149 Type 2 diabetes mellitus with other diabetic neurological complication: Secondary | ICD-10-CM | POA: Diagnosis not present

## 2014-04-11 DIAGNOSIS — I10 Essential (primary) hypertension: Secondary | ICD-10-CM | POA: Diagnosis not present

## 2014-04-11 DIAGNOSIS — Z23 Encounter for immunization: Secondary | ICD-10-CM | POA: Diagnosis not present

## 2014-04-13 DIAGNOSIS — Z1211 Encounter for screening for malignant neoplasm of colon: Secondary | ICD-10-CM | POA: Diagnosis not present

## 2014-04-13 DIAGNOSIS — Z86010 Personal history of colonic polyps: Secondary | ICD-10-CM | POA: Diagnosis not present

## 2014-04-13 DIAGNOSIS — K648 Other hemorrhoids: Secondary | ICD-10-CM | POA: Diagnosis not present

## 2014-04-18 LAB — PSA: PSA: 9.02

## 2014-05-04 DIAGNOSIS — N401 Enlarged prostate with lower urinary tract symptoms: Secondary | ICD-10-CM | POA: Diagnosis not present

## 2014-05-04 DIAGNOSIS — R972 Elevated prostate specific antigen [PSA]: Secondary | ICD-10-CM | POA: Diagnosis not present

## 2014-05-04 DIAGNOSIS — R351 Nocturia: Secondary | ICD-10-CM | POA: Diagnosis not present

## 2014-05-04 DIAGNOSIS — N390 Urinary tract infection, site not specified: Secondary | ICD-10-CM | POA: Diagnosis not present

## 2014-05-18 DIAGNOSIS — R972 Elevated prostate specific antigen [PSA]: Secondary | ICD-10-CM | POA: Diagnosis not present

## 2014-05-22 DIAGNOSIS — K5730 Diverticulosis of large intestine without perforation or abscess without bleeding: Secondary | ICD-10-CM | POA: Diagnosis not present

## 2014-05-22 DIAGNOSIS — D122 Benign neoplasm of ascending colon: Secondary | ICD-10-CM | POA: Diagnosis not present

## 2014-05-22 DIAGNOSIS — Z1211 Encounter for screening for malignant neoplasm of colon: Secondary | ICD-10-CM | POA: Diagnosis not present

## 2014-05-22 DIAGNOSIS — K635 Polyp of colon: Secondary | ICD-10-CM | POA: Diagnosis not present

## 2014-05-22 DIAGNOSIS — K621 Rectal polyp: Secondary | ICD-10-CM | POA: Diagnosis not present

## 2014-05-22 DIAGNOSIS — Z86010 Personal history of colonic polyps: Secondary | ICD-10-CM | POA: Diagnosis not present

## 2014-05-22 DIAGNOSIS — Z1212 Encounter for screening for malignant neoplasm of rectum: Secondary | ICD-10-CM | POA: Diagnosis not present

## 2014-05-22 DIAGNOSIS — K625 Hemorrhage of anus and rectum: Secondary | ICD-10-CM | POA: Diagnosis not present

## 2014-05-25 DIAGNOSIS — R972 Elevated prostate specific antigen [PSA]: Secondary | ICD-10-CM | POA: Diagnosis not present

## 2014-05-25 DIAGNOSIS — N401 Enlarged prostate with lower urinary tract symptoms: Secondary | ICD-10-CM | POA: Diagnosis not present

## 2014-05-25 DIAGNOSIS — R351 Nocturia: Secondary | ICD-10-CM | POA: Diagnosis not present

## 2014-06-01 ENCOUNTER — Other Ambulatory Visit: Payer: Medicare Other

## 2014-06-01 ENCOUNTER — Ambulatory Visit (HOSPITAL_BASED_OUTPATIENT_CLINIC_OR_DEPARTMENT_OTHER): Payer: Medicare Other

## 2014-06-01 ENCOUNTER — Telehealth: Payer: Self-pay | Admitting: Hematology

## 2014-06-01 ENCOUNTER — Ambulatory Visit (HOSPITAL_BASED_OUTPATIENT_CLINIC_OR_DEPARTMENT_OTHER): Payer: Medicare Other | Admitting: Hematology

## 2014-06-01 VITALS — BP 175/64 | HR 66 | Temp 98.5°F | Resp 19 | Ht 75.0 in | Wt 244.4 lb

## 2014-06-01 DIAGNOSIS — C9110 Chronic lymphocytic leukemia of B-cell type not having achieved remission: Secondary | ICD-10-CM

## 2014-06-01 LAB — CBC WITH DIFFERENTIAL/PLATELET
BASO%: 0.5 % (ref 0.0–2.0)
BASOS ABS: 0.1 10*3/uL (ref 0.0–0.1)
EOS%: 1.8 % (ref 0.0–7.0)
Eosinophils Absolute: 0.3 10*3/uL (ref 0.0–0.5)
HEMATOCRIT: 35.4 % — AB (ref 38.4–49.9)
HEMOGLOBIN: 11.4 g/dL — AB (ref 13.0–17.1)
LYMPH%: 61.7 % — ABNORMAL HIGH (ref 14.0–49.0)
MCH: 29.2 pg (ref 27.2–33.4)
MCHC: 32.3 g/dL (ref 32.0–36.0)
MCV: 90.2 fL (ref 79.3–98.0)
MONO#: 0.7 10*3/uL (ref 0.1–0.9)
MONO%: 4.8 % (ref 0.0–14.0)
NEUT%: 31.2 % — AB (ref 39.0–75.0)
NEUTROS ABS: 4.8 10*3/uL (ref 1.5–6.5)
Platelets: 263 10*3/uL (ref 140–400)
RBC: 3.92 10*6/uL — ABNORMAL LOW (ref 4.20–5.82)
RDW: 13.5 % (ref 11.0–14.6)
WBC: 15.3 10*3/uL — ABNORMAL HIGH (ref 4.0–10.3)
lymph#: 9.4 10*3/uL — ABNORMAL HIGH (ref 0.9–3.3)

## 2014-06-01 LAB — LACTATE DEHYDROGENASE (CC13): LDH: 213 U/L (ref 125–245)

## 2014-06-01 NOTE — Progress Notes (Signed)
[facility] OFF[facility] PROGRESS NOTE DATE OF VISIT: 06/01/2014  Antony Blackbird, MD [address] [place] [address]  DIAGNOSIS: CLL (chronic lymphocytic leukemia) - Plan: CBC with Differential, Lactate dehydrogenase (LDH) - [facility], CBC with Differential, Comprehensive metabolic panel (Cmet) - [facility], Lactate dehydrogenase (LDH) - [facility]  Chief Complaint  Patient presents with  . Follow-up    CURRENT TREATMENT: Observation.   INTERVAL HISTORY:  Ray Merritt 78 y.o. male with a history of newly diagnosed CLL is here for follow up.  He was initially seen by Dr Juliann Mule on 01/27/2014 and last visit with him was 03/02/2014.   His last visit with Dr. Florene Glen was on the 23 rd of June. He has classified stage 3 CKDz, etiology likely secondary to hypertensive nephropathy. He reports mildly elevated wbcs for years. He denies current smoking and quit at age 17 years old. He smoked about 16 years a pack a day. He denies requiring steroids. His last visit with Dr. Karlton Lemon this past spring, he was started on antibiotics for a sinus infection with resolution. He also reports a history of BPH, Brittle Diabetes, hypertension and dyslipidemia.  He denies any recent hospitalizations. He has had bilateral shoulder rotary cuff repair (2000, 2001) but no recent surgeries. He reports that his last colonoscopy was in 2010 with Dr. Collene Mares. CBC drawn on 01/10/2014 revealed WBC of 15.6, hemoglobin of 11.1, hematocrit of 32.4 and platelets of 225. BMP revealed a creatinine of 1.84 with eGFR of 40 mL/min/1.73 and normal calcium of 10.1. He had some swelling in his right calf during the visit with Dr. Florene Glen and duplex of right lower extremity was ordered and was negative for deep venous thrombosis. 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 He lives with his wife locally in [place], [place]. He reports that his sister died of cancer at age 64 (ovarian cancer). His oldest brother died of  throat cancer (he was a smoker). He has three children who he reports are all healthy.   Patient's the left some swelling in the leg and the family doctor is going to change some of his medications to address that. He denies any constitutional or B symptoms. He did get a flu shot this year and the pneumonia vaccine was about 3 years ago.  MEDICAL HISTORY: Past Medical History  Diagnosis Date  . Enlarged prostate   . Diabetes mellitus   . Hypertension     INTERIM HISTORY: has Leukocytosis, unspecified and CLL (chronic lymphocytic leukemia) on his problem list.    ALLERGIES:  has No Known Allergies.  MEDICATIONS: has a current medication list which includes the following prescription(s): albiglutide, amlodipine, aspirin, atorvastatin, brimonidine, candesartan-hydrochlorothiazide, carvedilol, cholecalciferol, insulin nph human, multivitamin with minerals, niacin, travoprost (bak free), and vitamin c.  SURGICAL HISTORY:  Past Surgical History  Procedure Laterality Date  . Shoulder surgery      REVIEW OF SYSTEMS:   Constitutional: Denies fevers, chills or abnormal weight loss Eyes: Denies blurriness of vision Ears, nose, mouth, throat, and face: Denies mucositis or sore throat Respiratory: Denies cough, dyspnea or wheezes Cardiovascular: Denies palpitation, chest discomfort or lower extremity swelling Gastrointestinal:  Denies nausea, heartburn or change in bowel habits Skin: Denies abnormal skin rashes Lymphatics: Denies new lymphadenopathy or easy bruising Neurological:Denies numbness, tingling or new weaknesses Behavioral/Psych: Mood is stable, no new changes  All other systems were reviewed with the patient and are negative.  PHYSICAL EXAMINATION: ECOG PERFORMANCE STATUS: 0  Blood  pressure 175/64, pulse 66, temperature 98.5 F (36.9 C), temperature source Oral, resp. rate 19, height '6\' 3"'  (1.905 m), weight 244 lb 6.4 oz (110.859 kg), SpO2 100 %.  GENERAL:alert, no distress  and comfortable; well developed and well nourished.  SKIN: skin color, texture, turgor are normal, no rashes or significant lesions EYES: normal, Conjunctiva are pink and non-injected, sclera clear OROPHARYNX:no exudate, no erythema and lips, buccal mucosa, and tongue normal  NECK: supple, thyroid normal size, non-tender, without nodularity LYMPH:  no palpable lymphadenopathy in the cervical, axillary or supraclavicular LUNGS: clear to auscultation with normal breathing effort, no wheezes or rhonchi HEART: regular rate & rhythm and no murmurs and no lower extremity edema ABDOMEN:abdomen soft, non-tender and normal bowel sounds Musculoskeletal:no cyanosis of digits and no clubbing  NEURO: alert & oriented x 3 with fluent speech, no focal motor/sensory deficits  Labs:   LABS FROM TODAY VISIT ARE PENDING AT THIS TIME AND I WILL NOTIFY PATIENT VIA PHONE CALL.  Lab Results  Component Value Date   WBC 14.7* 03/02/2014   HGB 11.2* 03/02/2014   HCT 34.9* 03/02/2014   MCV 90.1 03/02/2014   PLT 259 03/02/2014   NEUTROABS 4.7 03/02/2014      Chemistry      Component Value Date/Time   NA 139 01/27/2014 1342   NA 136 07/16/2009 1108   K 5.0 01/27/2014 1342   K 4.6 07/16/2009 1108   CL 102 07/16/2009 1108   CO2 21* 01/27/2014 1342   CO2 25 07/16/2009 1108   BUN 35.2* 01/27/2014 1342   BUN 34* 07/16/2009 1108   CREATININE 2.2* 01/27/2014 1342   CREATININE 1.84* 07/16/2009 1108      Component Value Date/Time   CALCIUM 9.6 01/27/2014 1342   CALCIUM 9.0 07/16/2009 1108   ALKPHOS 85 01/27/2014 1342   ALKPHOS 64 07/16/2009 1108   AST 12 01/27/2014 1342   AST 23 07/16/2009 1108   ALT 20 01/27/2014 1342   ALT 29 07/16/2009 1108   BILITOT 0.27 01/27/2014 1342   BILITOT 0.6 07/16/2009 1108       Pathology:  Interpretation Peripheral Blood Flow Cytometry - FINDINGS CONSISTENT WITH CHRONIC LYMPHOCYTIC LEUKEMIA. Susanne Greenhouse MD Pathologist, Electronic Signature (Case signed  01/30/2014)  RADIOGRAPHIC STUDIES: No results found.  ASSESSMENT: Ray Merritt 78 y.o. male with a history of CLL (chronic lymphocytic leukemia) - Plan: CBC with Differential, Lactate dehydrogenase (LDH) - [facility], CBC with Differential, Comprehensive metabolic panel (Cmet) - [facility], Lactate dehydrogenase (LDH) - [facility]   PLAN:   1. CLL, stage 0.  --We reviewed his labs drawn by his nephrologist and labs above with leukocytosis with a predominant of lympocytes. Smear reviewed with smudge cells. We sent his peripheral blood for flow cytometry consistent with CLL.  He denies early satiety, night sweats or lymphadenopathy. He does also have a mild anemia. His blood counts today are pending but he does not appear to have any constitutional symptoms. We will notify him of his labs.     2. Chronic Kidney disease.  --His creatinine clearance was 38.8 mL/min. He is being followed by nephrology. Likely due to #3.   3. DM2.  --He is on albiglutide 50 mg weekly and humulin 30 units bid. He has brittle diabetes.  4. Hypertension.  --He is on norvasc 10 mg daily; Atacand HCT daily; Coreg 6.25 mg daily.   5. Follow up.  --Patient will follow up in 6 months for repeat CBC, Chemistries and LDH.   All questions  were answered. The patient knows to call the clinic with any problems, questions or concerns. We can certainly see the patient much sooner if necessary.  I spent 15 minutes counseling the patient face to face. The total time spent in the appointment was 25 minutes.  Bernadene Bell, MD Medical Hematologist/Oncologist [address] Pager: [phone] Office No: [phone]

## 2014-06-01 NOTE — Telephone Encounter (Signed)
Gave avs & cal for May 2016. °

## 2014-06-05 DIAGNOSIS — H4011X1 Primary open-angle glaucoma, mild stage: Secondary | ICD-10-CM | POA: Diagnosis not present

## 2014-08-16 DIAGNOSIS — N183 Chronic kidney disease, stage 3 (moderate): Secondary | ICD-10-CM | POA: Diagnosis not present

## 2014-08-16 DIAGNOSIS — C9110 Chronic lymphocytic leukemia of B-cell type not having achieved remission: Secondary | ICD-10-CM | POA: Diagnosis not present

## 2014-08-16 DIAGNOSIS — I10 Essential (primary) hypertension: Secondary | ICD-10-CM | POA: Diagnosis not present

## 2014-08-21 DIAGNOSIS — Z794 Long term (current) use of insulin: Secondary | ICD-10-CM | POA: Diagnosis not present

## 2014-08-21 DIAGNOSIS — I10 Essential (primary) hypertension: Secondary | ICD-10-CM | POA: Diagnosis not present

## 2014-08-21 DIAGNOSIS — N183 Chronic kidney disease, stage 3 (moderate): Secondary | ICD-10-CM | POA: Diagnosis not present

## 2014-08-21 DIAGNOSIS — R809 Proteinuria, unspecified: Secondary | ICD-10-CM | POA: Diagnosis not present

## 2014-08-21 DIAGNOSIS — E785 Hyperlipidemia, unspecified: Secondary | ICD-10-CM | POA: Diagnosis not present

## 2014-08-21 DIAGNOSIS — N400 Enlarged prostate without lower urinary tract symptoms: Secondary | ICD-10-CM | POA: Diagnosis not present

## 2014-08-21 DIAGNOSIS — E1122 Type 2 diabetes mellitus with diabetic chronic kidney disease: Secondary | ICD-10-CM | POA: Diagnosis not present

## 2014-08-21 DIAGNOSIS — E1165 Type 2 diabetes mellitus with hyperglycemia: Secondary | ICD-10-CM | POA: Diagnosis not present

## 2014-08-21 DIAGNOSIS — Z79899 Other long term (current) drug therapy: Secondary | ICD-10-CM | POA: Diagnosis not present

## 2014-08-21 LAB — LIPID PANEL
HDL: 37 mg/dL (ref 35–70)
LDL CALC: 87 mg/dL
Triglycerides: 217 mg/dL — AB (ref 40–160)

## 2014-09-18 DIAGNOSIS — E119 Type 2 diabetes mellitus without complications: Secondary | ICD-10-CM | POA: Diagnosis not present

## 2014-09-18 DIAGNOSIS — H2513 Age-related nuclear cataract, bilateral: Secondary | ICD-10-CM | POA: Diagnosis not present

## 2014-09-18 DIAGNOSIS — H4011X1 Primary open-angle glaucoma, mild stage: Secondary | ICD-10-CM | POA: Diagnosis not present

## 2014-11-01 ENCOUNTER — Telehealth: Payer: Self-pay | Admitting: Internal Medicine

## 2014-11-01 NOTE — Telephone Encounter (Signed)
Called and left a message with a new appointment with dr Julien Nordmann

## 2014-11-30 ENCOUNTER — Ambulatory Visit: Payer: BLUE CROSS/BLUE SHIELD

## 2014-11-30 ENCOUNTER — Other Ambulatory Visit: Payer: BLUE CROSS/BLUE SHIELD

## 2014-12-13 ENCOUNTER — Other Ambulatory Visit (HOSPITAL_BASED_OUTPATIENT_CLINIC_OR_DEPARTMENT_OTHER): Payer: Medicare Other

## 2014-12-13 ENCOUNTER — Encounter: Payer: Self-pay | Admitting: Internal Medicine

## 2014-12-13 ENCOUNTER — Telehealth: Payer: Self-pay | Admitting: Internal Medicine

## 2014-12-13 ENCOUNTER — Other Ambulatory Visit: Payer: Self-pay | Admitting: *Deleted

## 2014-12-13 ENCOUNTER — Ambulatory Visit (HOSPITAL_BASED_OUTPATIENT_CLINIC_OR_DEPARTMENT_OTHER): Payer: Medicare Other | Admitting: Internal Medicine

## 2014-12-13 VITALS — BP 174/66 | HR 63 | Temp 97.8°F | Resp 18 | Ht 75.0 in | Wt 245.3 lb

## 2014-12-13 DIAGNOSIS — C9110 Chronic lymphocytic leukemia of B-cell type not having achieved remission: Secondary | ICD-10-CM

## 2014-12-13 LAB — COMPREHENSIVE METABOLIC PANEL (CC13)
ALK PHOS: 87 U/L (ref 40–150)
ALT: 18 U/L (ref 0–55)
ANION GAP: 10 meq/L (ref 3–11)
AST: 16 U/L (ref 5–34)
Albumin: 3.7 g/dL (ref 3.5–5.0)
BUN: 40.4 mg/dL — ABNORMAL HIGH (ref 7.0–26.0)
CO2: 18 mEq/L — ABNORMAL LOW (ref 22–29)
Calcium: 9 mg/dL (ref 8.4–10.4)
Chloride: 109 mEq/L (ref 98–109)
Creatinine: 2.4 mg/dL — ABNORMAL HIGH (ref 0.7–1.3)
EGFR: 25 mL/min/{1.73_m2} — AB (ref >90)
Glucose: 300 mg/dl — ABNORMAL HIGH (ref 70–140)
Potassium: 5.3 mEq/L — ABNORMAL HIGH (ref 3.5–5.1)
Sodium: 138 mEq/L (ref 136–145)
TOTAL PROTEIN: 6.8 g/dL (ref 6.4–8.3)
Total Bilirubin: 0.28 mg/dL (ref 0.20–1.20)

## 2014-12-13 LAB — CBC WITH DIFFERENTIAL/PLATELET
BASO%: 0.8 % (ref 0.0–2.0)
Basophils Absolute: 0.1 10*3/uL (ref 0.0–0.1)
EOS%: 2.5 % (ref 0.0–7.0)
Eosinophils Absolute: 0.4 10*3/uL (ref 0.0–0.5)
HCT: 33 % — ABNORMAL LOW (ref 38.4–49.9)
HEMOGLOBIN: 11.3 g/dL — AB (ref 13.0–17.1)
LYMPH#: 9.2 10*3/uL — AB (ref 0.9–3.3)
LYMPH%: 61.1 % — ABNORMAL HIGH (ref 14.0–49.0)
MCH: 31 pg (ref 27.2–33.4)
MCHC: 34.2 g/dL (ref 32.0–36.0)
MCV: 90.7 fL (ref 79.3–98.0)
MONO#: 0.8 10*3/uL (ref 0.1–0.9)
MONO%: 5.4 % (ref 0.0–14.0)
NEUT#: 4.5 10*3/uL (ref 1.5–6.5)
NEUT%: 30.2 % — AB (ref 39.0–75.0)
PLATELETS: 267 10*3/uL (ref 140–400)
RBC: 3.63 10*6/uL — ABNORMAL LOW (ref 4.20–5.82)
RDW: 13.3 % (ref 11.0–14.6)
WBC: 15.1 10*3/uL — AB (ref 4.0–10.3)

## 2014-12-13 LAB — LACTATE DEHYDROGENASE (CC13): LDH: 199 U/L (ref 125–245)

## 2014-12-13 LAB — TECHNOLOGIST REVIEW

## 2014-12-13 NOTE — Progress Notes (Signed)
[facility] [phone]   [phone]  OFFICE PROGRESS NOTE  FULP, CAMMIE, MD [address] [address]  DIAGNOSIS: Chronic lymphocytic leukemia diagnosed in July 2015  PRIOR THERAPY: None  CURRENT THERAPY: Observation.  INTERVAL HISTORY: Ray Merritt 79 y.o. male returns to the clinic today for follow-up visit and to establish care with me after his former oncologist has left the practice. The patient was diagnosed with chronic lymphocytic leukemia in July 2015. He has been observation for the last 9 months with no significant complaints. He is feeling fine today and denied having any significant fatigue or weakness. He denied having any weight loss or night sweats. The patient denied having any chest pain, shortness of breath, cough or hemoptysis. He has no palpable lymphadenopathy. He has repeat CBC performed earlier today and he is here for evaluation and discussion of his lab results.  MEDICAL HISTORY: Past Medical History  Diagnosis Date  . Enlarged prostate   . Diabetes mellitus   . Hypertension     ALLERGIES:  has No Known Allergies.  MEDICATIONS:  Current Outpatient Prescriptions  Medication Sig Dispense Refill  . Albiglutide (TANZEUM) 50 MG PEN Inject into the skin once a week.    Marland Kitchen amLODipine (NORVASC) 10 MG tablet Take 10 mg by mouth daily.    Marland Kitchen aspirin 81 MG tablet Take 81 mg by mouth daily.    Marland Kitchen atorvastatin (LIPITOR) 10 MG tablet Take 10 mg by mouth daily.    . brimonidine (ALPHAGAN) 0.15 % ophthalmic solution Place 1 drop into both eyes 2 (two) times daily.    . candesartan-hydrochlorothiazide (ATACAND HCT) 32-12.5 MG per tablet Take 1 tablet by mouth daily.    . carvedilol (COREG) 6.25 MG tablet Take 6.25 mg by mouth 2 (two) times daily with a meal.    . cholecalciferol (VITAMIN D) 1000 UNITS tablet Take 2,000 Units by mouth daily.     . insulin NPH (HUMULIN N,NOVOLIN N) 100 UNIT/ML injection Inject 30  Units into the skin 2 (two) times daily.    . Multiple Vitamins-Minerals (MULTIVITAMIN WITH MINERALS) tablet Take 1 tablet by mouth daily.    . niacin 500 MG tablet Take 500 mg by mouth daily with breakfast.    . Travoprost, BAK Free, (TRAVATAN) 0.004 % SOLN ophthalmic solution Place 1 drop into both eyes at bedtime.    . triamcinolone cream (KENALOG) 0.1 %   0  . vitamin C (ASCORBIC ACID) 500 MG tablet Take 1,000 mg by mouth daily.     No current facility-administered medications for this visit.    SURGICAL HISTORY:  Past Surgical History  Procedure Laterality Date  . Shoulder surgery      REVIEW OF SYSTEMS:  A comprehensive review of systems was negative.   PHYSICAL EXAMINATION: General appearance: alert, cooperative and no distress Head: Normocephalic, without obvious abnormality, atraumatic Neck: no adenopathy, no JVD, supple, symmetrical, trachea midline and thyroid not enlarged, symmetric, no tenderness/mass/nodules Lymph nodes: Cervical, supraclavicular, and axillary nodes normal. Resp: clear to auscultation bilaterally Back: symmetric, no curvature. ROM normal. No CVA tenderness. Cardio: regular rate and rhythm, S1, S2 normal, no murmur, click, rub or gallop GI: soft, non-tender; bowel sounds normal; no masses,  no organomegaly Extremities: extremities normal, atraumatic, no cyanosis or edema Neurologic: Alert and oriented X 3, normal strength and tone. Normal symmetric reflexes. Normal coordination and gait  ECOG PERFORMANCE STATUS: 1 - Symptomatic but completely ambulatory  Blood  pressure 174/66, pulse 63, temperature 97.8 F (36.6 C), temperature source Oral, resp. rate 18, height 6\' 3"  (1.905 m), weight 245 lb 4.8 oz (111.267 kg), SpO2 99 %.  LABORATORY DATA: Lab Results  Component Value Date   WBC 15.1* 12/13/2014   HGB 11.3* 12/13/2014   HCT 33.0* 12/13/2014   MCV 90.7 12/13/2014   PLT 267 12/13/2014      Chemistry      Component Value Date/Time   NA 139  01/27/2014 1342   NA 136 07/16/2009 1108   K 5.0 01/27/2014 1342   K 4.6 07/16/2009 1108   CL 102 07/16/2009 1108   CO2 21* 01/27/2014 1342   CO2 25 07/16/2009 1108   BUN 35.2* 01/27/2014 1342   BUN 34* 07/16/2009 1108   CREATININE 2.2* 01/27/2014 1342   CREATININE 1.84* 07/16/2009 1108      Component Value Date/Time   CALCIUM 9.6 01/27/2014 1342   CALCIUM 9.0 07/16/2009 1108   ALKPHOS 85 01/27/2014 1342   ALKPHOS 64 07/16/2009 1108   AST 12 01/27/2014 1342   AST 23 07/16/2009 1108   ALT 20 01/27/2014 1342   ALT 29 07/16/2009 1108   BILITOT 0.27 01/27/2014 1342   BILITOT 0.6 07/16/2009 1108       RADIOGRAPHIC STUDIES: No results found.  ASSESSMENT AND PLAN: This is a very pleasant 79 years old African-American male diagnosed with chronic lymphocytic leukemia and currently on observation. The patient is feeling fine today with no specific complaints. His CBC today showed no significant evidence for disease progression in his total white blood count is almost similar to 6 months ago. I discussed the lab result with the patient today. I recommended for him to continue on observation with repeat CBC, comprehensive metabolic panel and LDH in 6 months. He was advised to call immediately if he has any concerning symptoms in the interval. The patient voices understanding of current disease status and treatment options and is in agreement with the current care plan.  All questions were answered. The patient knows to call the clinic with any problems, questions or concerns. We can certainly see the patient much sooner if necessary.  Disclaimer: This note was dictated with voice recognition software. Similar sounding words can inadvertently be transcribed and may not be corrected upon review.

## 2014-12-13 NOTE — Telephone Encounter (Signed)
per pof to sch pt appt-gave pt copy of sch °

## 2014-12-20 DIAGNOSIS — D638 Anemia in other chronic diseases classified elsewhere: Secondary | ICD-10-CM | POA: Diagnosis not present

## 2014-12-20 DIAGNOSIS — E1122 Type 2 diabetes mellitus with diabetic chronic kidney disease: Secondary | ICD-10-CM | POA: Diagnosis not present

## 2014-12-20 DIAGNOSIS — Z794 Long term (current) use of insulin: Secondary | ICD-10-CM | POA: Diagnosis not present

## 2014-12-20 DIAGNOSIS — N183 Chronic kidney disease, stage 3 (moderate): Secondary | ICD-10-CM | POA: Diagnosis not present

## 2014-12-20 DIAGNOSIS — I10 Essential (primary) hypertension: Secondary | ICD-10-CM | POA: Diagnosis not present

## 2014-12-20 DIAGNOSIS — E785 Hyperlipidemia, unspecified: Secondary | ICD-10-CM | POA: Diagnosis not present

## 2014-12-20 DIAGNOSIS — R809 Proteinuria, unspecified: Secondary | ICD-10-CM | POA: Diagnosis not present

## 2014-12-20 LAB — HEMOGLOBIN A1C: Hgb A1c MFr Bld: 9.4 % — AB (ref 4.0–6.0)

## 2015-01-15 DIAGNOSIS — H4011X1 Primary open-angle glaucoma, mild stage: Secondary | ICD-10-CM | POA: Diagnosis not present

## 2015-01-25 ENCOUNTER — Ambulatory Visit (INDEPENDENT_AMBULATORY_CARE_PROVIDER_SITE_OTHER): Payer: Medicare Other | Admitting: Endocrinology

## 2015-01-25 ENCOUNTER — Encounter: Payer: Self-pay | Admitting: Endocrinology

## 2015-01-25 ENCOUNTER — Other Ambulatory Visit: Payer: Self-pay | Admitting: *Deleted

## 2015-01-25 ENCOUNTER — Telehealth: Payer: Self-pay | Admitting: Internal Medicine

## 2015-01-25 VITALS — BP 160/62 | HR 68 | Temp 98.2°F | Resp 16 | Ht 75.0 in | Wt 244.8 lb

## 2015-01-25 DIAGNOSIS — N184 Chronic kidney disease, stage 4 (severe): Secondary | ICD-10-CM

## 2015-01-25 DIAGNOSIS — IMO0002 Reserved for concepts with insufficient information to code with codable children: Secondary | ICD-10-CM

## 2015-01-25 DIAGNOSIS — E1165 Type 2 diabetes mellitus with hyperglycemia: Secondary | ICD-10-CM

## 2015-01-25 DIAGNOSIS — I10 Essential (primary) hypertension: Secondary | ICD-10-CM | POA: Diagnosis not present

## 2015-01-25 DIAGNOSIS — E785 Hyperlipidemia, unspecified: Secondary | ICD-10-CM

## 2015-01-25 DIAGNOSIS — N189 Chronic kidney disease, unspecified: Secondary | ICD-10-CM | POA: Insufficient documentation

## 2015-01-25 LAB — POCT GLUCOSE (DEVICE FOR HOME USE): GLUCOSE FASTING, POC: 437 mg/dL — AB (ref 70–99)

## 2015-01-25 NOTE — Progress Notes (Signed)
Patient ID: Ray Merritt, male   DOB: 1935/11/02, 79 y.o.   MRN: [id number]           Reason for Appointment: Consultation for Type 2 Diabetes  Referring physician: Fulp  History of Present Illness:          Date of diagnosis of type 2 diabetes mellitus : 1982        Background history:   He has been on insulin since the time of diagnosis, initially his blood sugars were significantly high and he was symptomatic with dry mouth and blurred vision. He thinks he was probably taking NPH and Regular Insulin for quite some time  Subsequently he was taking NPH insulin only from my primary care physician 3 years ago he was put on Bydureon weekly in addition to NPH.  He thinks that it initially curb his appetite but he does not think he lost any significant amount of weight.  Also he does not think his blood sugar control was much better For about a year he has been switched from Bydureon to Tanzeum weekly  Recent history:   INSULIN regimen is described as: Novolin 30 units acb, acs      He thinks his blood sugars have been generally consistently poorly controlled A1c has been as high as 11.8 in 2/16 His insulin dose has not been changed in quite some time  Current blood sugar patterns and problems identified:  He is checking his blood sugars only in the morning usually and these are fairly consistent  He continues to use a generic monitor  His blood sugars in the labs this year have been consistently high and have been mostly over 300  He thinks that if he is very active during the day he may feel his blood sugar drop especially if he is not eating on time     Other hypoglycemic drugs the patient is taking are:   Tanzeum 50 mg weekly     Side effects from medications have been: None  Compliance with the medical regimen: Good Hypoglycemia: with activity    Glucose monitoring:  done 1 times a day         Glucometer:  [employer] brand     Blood Glucose readings by recall   PREMEAL  Breakfast Lunch Dinner Bedtime  Overall   Glucose range: 140-150      Median:        Self-care: The diet that the patient has been following is: tries to limit portions .     Typical meal intake: Breakfast is old-fashioned oatmeal, lunch is usually a sandwich.  He has mostly popcorn for snacks               Dietician visit, most recent: none                Exercise:  some walking, mostly gardening activities  Weight history: 232  Wt Readings from Last 3 Encounters:  01/25/15 244 lb 12.8 oz (111.041 kg)  12/13/14 245 lb 4.8 oz (111.267 kg)  06/01/14 244 lb 6.4 oz (110.859 kg)    Glycemic control:   Lab Results  Component Value Date   HGBA1C 9.4* 12/20/2014   Lab Results  Component Value Date   LDLCALC 87 08/21/2014   CREATININE 2.4* 12/13/2014   Microalbumin/creatinine ratio was 378 done on 12/20/14      Medication List       This list is accurate as of: 01/25/15 12:34 PM.  Always use  your most recent med list.               amLODipine 10 MG tablet  Commonly known as:  NORVASC  Take 10 mg by mouth daily.     Amlodipine-Valsartan-HCTZ 5-160-12.5 MG Tabs  Take by mouth.     aspirin 81 MG tablet  Take 81 mg by mouth daily.     atorvastatin 10 MG tablet  Commonly known as:  LIPITOR  Take 10 mg by mouth daily.     brimonidine 0.15 % ophthalmic solution  Commonly known as:  ALPHAGAN  Place 1 drop into both eyes 2 (two) times daily.     candesartan-hydrochlorothiazide 32-12.5 MG per tablet  Commonly known as:  ATACAND HCT  Take 1 tablet by mouth daily.     carvedilol 6.25 MG tablet  Commonly known as:  COREG  Take 6.25 mg by mouth 2 (two) times daily with a meal.     cholecalciferol 1000 UNITS tablet  Commonly known as:  VITAMIN D  Take 2,000 Units by mouth daily.     insulin NPH Human 100 UNIT/ML injection  Commonly known as:  HUMULIN N,NOVOLIN N  Inject 30 Units into the skin 2 (two) times daily.     multivitamin with minerals tablet  Take 1 tablet  by mouth daily.     niacin 500 MG tablet  Take 500 mg by mouth daily with breakfast.     TANZEUM 50 MG Pen  Generic drug:  Albiglutide  Inject into the skin once a week.     Travoprost (BAK Free) 0.004 % Soln ophthalmic solution  Commonly known as:  TRAVATAN  Place 1 drop into both eyes at bedtime.     triamcinolone cream 0.1 %  Commonly known as:  KENALOG     vitamin C 500 MG tablet  Commonly known as:  ASCORBIC ACID  Take 1,000 mg by mouth daily.        Allergies: No Known Allergies  Past Medical History  Diagnosis Date  . Enlarged prostate   . Diabetes mellitus   . Hypertension   . Chronic kidney disease   . Anemia     Past Surgical History  Procedure Laterality Date  . Shoulder surgery      Family History  Problem Relation Age of Onset  . Hypertension Mother   . Heart disease Sister   . Stroke Sister   . Diabetes Brother     Social History:  reports that he has never smoked. He does not have any smokeless tobacco history on file. He reports that he does not drink alcohol or use illicit drugs.    Review of Systems    Lipid history: Has been on medication for a few years with Lipitor and OTC niacin, using nature made brand    Lab Results  Component Value Date   HDL 37 08/21/2014   LDLCALC 87 08/21/2014   TRIG 217* 08/21/2014           Constitutional: no recent weight gain/loss, no complaints of unusual fatigue   Eyes: no history of blurred vision.  Most recent eye exam was in 08/2014  ENT: no difficulty swallowing, no hoarseness   Cardiovascular: no chest pain or tightness on exertion.  No leg swelling.  Hypertension:  Respiratory: no cough/shortness of breath  Gastrointestinal: no constipation, diarrhea, nausea or abdominal pain  Musculoskeletal: no muscle/joint aches   Urological:   No frequency of urination or  Nocturia Has history of high  PSA  Skin: no rash or infections  Neurological: no headaches.   Has no numbness, burning,  pains or tingling in feet   Diabetic foot exam done in 7/16  Psychiatric: no symptoms of depression  Endocrine: No unusual fatigue, cold intolerance or history of thyroid disease     Physical Examination:  BP 160/62 mmHg  Pulse 68  Temp(Src) 98.2 F (36.8 C)  Resp 16  Ht 6\' 3"  (1.905 m)  Wt 244 lb 12.8 oz (111.041 kg)  BMI 30.60 kg/m2  SpO2 97%  GENERAL:         Patient has mostly abdominal obesity.   HEENT:         Eye exam shows normal external appearance. Fundus exam shows no retinopathy.  Oral exam shows normal mucosa .  NECK:   There is no lymphadenopathy Thyroid is enlarged on the left side, slightly firm and smooth.  No specific nodule felt,  the isthmus and medial lobe just palpable Carotids are normal to palpation and no bruit heard LUNGS:         Chest is symmetrical. Lungs are clear to auscultation.Marland Kitchen   HEART:         Heart sounds:  S1 and S2 are normal. No murmurs or clicks heard., no S3 or S4.   ABDOMEN:   There is no distention present. Liver and spleen are not palpable. No other mass or tenderness present.    NEUROLOGICAL:   Vibration sense is markedly reduced in distal right first toes and absent on the left. Ankle jerks are absent bilaterally.          2+ right ankle and 1+ left ankle edema present Diabetic foot exam shows: Decreased monofilament sensation on the toes and minimal on the right plantar surface No callus formation seen Dorsalis pedis pulse is not palpable  MUSCULOSKELETAL:  There is no swelling or deformity of the peripheral joints. Spine is normal to inspection.   EXTREMITIES:     There is no edema. No skin lesions present.Marland Kitchen SKIN:       No rash or lesions of concern.        ASSESSMENT:  Diabetes type 2, uncontrolled    He has had long-standing diabetes and appears to be insulin deficient Currently on NPH and Tanzeum and appears to have markedly increased postprandial readings including reading over 400 today after breakfast Not clear if he  has an accurate glucose monitor at home, he thinks his blood sugars are below 150 in the morning Discussed with the patient that he needs to have rapid acting mealtime insulin with each meal especially at breakfast and supper Also discussed blood sugar targets and adjustment of both basal and mealtime insulin based on blood sugar patterns He has not had any significant diabetes education Currently eating relatively unbalanced high carbohydrate breakfast  Complications:?  Nephropathy.  Has mild asymptomatic neuropathy also.  Unknown findings from his last retinal exam  Chronic kidney disease with creatinine 2.2 followed by nephrologist  GOITER: He appears to have at least a left lobe enlargement and this should be evaluated further with TSH and thyroid ultrasound  PLAN:   Start mealtime insulin with regular insulin with each meal as instructed in patient instructions section.  He can continue to use syringes for his insulin as he has been used to doing this and may use Novolin R which will be less expensive than Novolog  Reminded him to take this 30 minutes before eating; also may mix this with  NPH insulin in the morning  Discussed when to check his blood sugar about 2 hours after meals at least once a day in addition to periodic fasting readings  Discussed blood sugar targets at various times  He was shown how to use a contour next meter today and he will start using this with the strips from the pharmacy  He will adjust the regular insulin if blood sugars are consistently over 200 postprandially  To schedule him with the nurse educator for follow-up and general education in about a week  Since he is going to take regular insulin at suppertime he can reduce his evening NPH by at least 2 units and move this to bedtime  Consistent balanced meals and have some protein at breakfast especially  Stop Tanzeum as it is unlikely to be helping  Also stop niacin as he is probably getting an  ineffective OTC preparation and it is not FDA indicated for hyperlipidemia in combination with statins  Patient Instructions  Check blood sugars on waking up .Marland Kitchen  3-4 .Marland Kitchen times a week Also check blood sugars about 2 hours after a meal and do this after different meals by rotation  Recommended blood sugar levels on waking up is 90-130 and about 2 hours after meal is 140-180 Please bring blood sugar monitor to each visit.  REGULAR INSULIN: Preferably take this 15-30 minutes before your planned meal. Take 10 units before breakfast and supper and 5 units before lunch May skip the lunch dose if planning to be active in the afternoon  Have protein with breakfast consistently as discussed  NOVOLIN N: Reduce the bedtime dose to 28 units for now  May stop Tanzeum and niacin   Counseling time on subjects discussed above is over 50% of today's 60 minute visit  Donita Newland 01/25/2015, 12:34 PM   Note: This office note was prepared with Estate agent. Any transcriptional errors that result from this process are unintentional.

## 2015-01-25 NOTE — Patient Instructions (Addendum)
Check blood sugars on waking up .Marland Kitchen  3-4 .Marland Kitchen times a week Also check blood sugars about 2 hours after a meal and do this after different meals by rotation  Recommended blood sugar levels on waking up is 90-130 and about 2 hours after meal is 140-180 Please bring blood sugar monitor to each visit.  REGULAR INSULIN: Preferably take this 15-30 minutes before your planned meal. Take 10 units before breakfast and supper and 5 units before lunch May skip the lunch dose if planning to be active in the afternoon  Have protein with breakfast consistently as discussed  NOVOLIN N: Reduce the bedtime dose to 28 units for now  May stop Tanzeum and niacin

## 2015-01-25 NOTE — Telephone Encounter (Signed)
Rhonda with [facility] called and reported pt needed insulin called in.  Was seen today and medications were changed.  No insulin called in.  States needs regular insulin.  Reviewed notes and spoke to Dr Cruzita Lederer.  Called in regular insulin (to [facility]) with instructions to take 10 units before breakfast, 5 units before lunch and 10 units before supper (as instructed).  Called in one vial.

## 2015-01-25 NOTE — Telephone Encounter (Signed)
Thanks

## 2015-01-26 ENCOUNTER — Other Ambulatory Visit: Payer: Self-pay | Admitting: *Deleted

## 2015-01-26 ENCOUNTER — Telehealth: Payer: Self-pay | Admitting: Endocrinology

## 2015-01-26 MED ORDER — GLUCOSE BLOOD VI STRP: ORAL_STRIP | Status: DC

## 2015-01-26 NOTE — Telephone Encounter (Signed)
[facility] note dated 01/25/15 at 5:41 PM: Initial Comment Caller saw his physician this morning and the physician was going to call in some insulin that the caller needs to take before meals. The insulin not filled at [facility] [phone] Additional Documentation ---Reports that MD took him off of his [place]am[place] and was to place on 10U Reg insulin before breakfast, 5U at lunch and 10U at dinner. Was told to begin this insulin this evening. Patient also take NPH insulin and the dosage was lowered on this med today. Document the name of the medication. ---10U Reg insulin before breakfast, 5U at lunch and 10U at [employer] MD to call back with further instructions.

## 2015-01-26 NOTE — Telephone Encounter (Signed)
rx sent

## 2015-01-26 NOTE — Telephone Encounter (Signed)
Pt needs rx for test strips for monitor contour next ez called into [facility] on [address]

## 2015-02-12 ENCOUNTER — Encounter: Payer: Medicare Other | Attending: Endocrinology | Admitting: Nutrition

## 2015-02-12 DIAGNOSIS — E1165 Type 2 diabetes mellitus with hyperglycemia: Secondary | ICD-10-CM

## 2015-02-12 DIAGNOSIS — IMO0002 Reserved for concepts with insufficient information to code with codable children: Secondary | ICD-10-CM

## 2015-02-12 NOTE — Progress Notes (Signed)
Ray Merritt is here to review blood sugar readings.  He reports that his wife went into the hospital last week with 2 bleeding ulcers, and that the stress of staying with her and eating at the cafeteria have caused his blood sugars to go into the 200s. She came home yesteday after having 2 units of blood.     He says that the week before his wife went to the hospital all of his readings were below 140.  Meter download shows this to be the case.On the 19th, FBS was 140 and acS was 128,  The 18th, FBS was 152, and acS: 75.     No low blood sugars  Insulin dose:  R: 10acB, 5acL, 10acS  He is waiting 30 min. After injecting before eating each meal.                          NPH: 30 AM, and 26 evening.    Meals are balanced and consistent in the amount of carbs at each meal respectively.    Plan:  Wait 3-4 more days. If Fasting blood sugars are still  Over 140, increase PM dose of NPH by 2 units from 26  To 28.  Call in 2 weeks with blood sugar readings.

## 2015-02-13 NOTE — Patient Instructions (Signed)
Wait 3-4 days.  If Fasting blood sugars are still over 140, increase the PM dose by 2 units.   Test blood sugars occasionally at bedtime--3X/wk. Call readings in 2 weeks:  [phone] [id number]

## 2015-02-22 ENCOUNTER — Ambulatory Visit: Payer: Medicare Other | Admitting: Endocrinology

## 2015-02-27 ENCOUNTER — Encounter: Payer: Self-pay | Admitting: Endocrinology

## 2015-02-27 ENCOUNTER — Ambulatory Visit (INDEPENDENT_AMBULATORY_CARE_PROVIDER_SITE_OTHER): Payer: Medicare Other | Admitting: Endocrinology

## 2015-02-27 VITALS — BP 170/62 | HR 63 | Temp 97.9°F | Resp 16 | Ht 75.0 in | Wt 255.2 lb

## 2015-02-27 DIAGNOSIS — E1165 Type 2 diabetes mellitus with hyperglycemia: Secondary | ICD-10-CM

## 2015-02-27 DIAGNOSIS — N184 Chronic kidney disease, stage 4 (severe): Secondary | ICD-10-CM

## 2015-02-27 DIAGNOSIS — E041 Nontoxic single thyroid nodule: Secondary | ICD-10-CM

## 2015-02-27 DIAGNOSIS — IMO0002 Reserved for concepts with insufficient information to code with codable children: Secondary | ICD-10-CM

## 2015-02-27 LAB — T4, FREE: Free T4: 0.9 ng/dL (ref 0.60–1.60)

## 2015-02-27 LAB — TSH: TSH: 2.06 u[IU]/mL (ref 0.35–4.50)

## 2015-02-27 NOTE — Patient Instructions (Signed)
Stop Regular insulin at lunch  N insulin 30 at bedtime and 28 in am  If active in am reduce R insulin to 6-7 units  If sugar at 8pm is mostly > 180 then use 7-8 units of R insulin at supper  Check blood sugars on waking up .Marland Kitchen4-5  .Marland Kitchen times a week Also check blood sugars about 2 hours after a meal and do this after different meals by rotation  Recommended blood sugar levels on waking up is 90-130 and about 2 hours after meal is 140-180 Please bring blood sugar monitor to each visit.

## 2015-02-27 NOTE — Progress Notes (Signed)
Quick Note:  Please let patient know that the thyroid blood test is normal, will need ultrasound to look at his thyroid and this has been ordered  ______

## 2015-02-27 NOTE — Progress Notes (Signed)
Patient ID: Ray Merritt, male   DOB: [birth year], 79 y.o.   MRN: [id number]           Reason for Appointment: Consultation for Type 2 Diabetes  Referring physician: Fulp  History of Present Illness:          Date of diagnosis of type 2 diabetes mellitus : 1982        Background history:   He has been on insulin since the time of diagnosis, initially his blood sugars were significantly high ; he was symptomatic with dry mouth and blurred vision. He thinks he was probably taking NPH and Regular Insulin for quite some time and before his consultation was only on NPH 3 years ago he was put on Bydureon weekly in addition to NPH.  He thinks that it initially curb his appetite but he does not think he lost any significant amount of weight.  Also he does not think his blood sugar control was much better For about a year he has been switched from Bydureon to Tanzeum weekly A1c has been as high as 11.8 in 2/16  Recent history:    INSULIN regimen is described as: Novolin 30 units acb and 28 units bedtime , Regular Insulin 10 units before breakfast and supper and 5 units before lunc       On his initial consultation because of high postprandial readings he was started on Regular Insulin before each meal  His baseline A1c in 6/16 was 9.4.   He was also seen by nurse educator for basic diabetes education   He has now started using the Contour meter which was downloaded for review  Current blood sugar patterns and problems identified:  He is checking his blood sugars as directed around lunch and suppertime but not after supper as instructed.   His highest readings appear to be fasting although they are gradually improving.  Although he has had readings well over  200 initially they are all improving   blood sugars are relatively better around lunchtime add more recently having lower including one episode of hypoglycemia.   blood sugars at suppertime  Has improved significantly and have been  in the hypoglycemic range almost daily in the last 10 days    His  Tanzeum was stopped because of lack of benefit   Although he has gained weight since his last visit his does not think his appetite is increased with stopping Tanzeum  He thinks that if he is very active during the day he may feel his blood sugar drop but has been trying to reduce his insulin or not take it before going out for activity; usually more active after breakfast    Other hypoglycemic drugs the patient is taking are:    none Side effects from medications have been: None  Compliance with the medical regimen: Good Hypoglycemia: with activity symptoms are weakness and feeling sweaty, treated with juice or other sweet drinks   Glucose monitoring:  done 1 times a day         Glucometer:  Contour      Blood Glucose readings by recall   PREMEAL Breakfast Lunch Dinner Bedtime  Overall   Glucose range:   101-295  56-301  59-229    Median:  188  186  102   155   Self-care: The diet that the patient has been following is: tries to limit portions .     Typical meal intake: Breakfast is old-fashioned oatmeal, lunch is usually  a sandwich.  He has mostly popcorn for snacks               Dietician visit, most recent: none  CDE: 7/16               Exercise:  some walking, mostly gardening activities  Weight history: 232  Wt Readings from Last 3 Encounters:  02/27/15 255 lb 3.2 oz (115.758 kg)  01/25/15 244 lb 12.8 oz (111.041 kg)  12/13/14 245 lb 4.8 oz (111.267 kg)    Glycemic control:   Lab Results  Component Value Date   HGBA1C 9.4* 12/20/2014   Lab Results  Component Value Date   LDLCALC 87 08/21/2014   CREATININE 2.4* 12/13/2014   Microalbumin/creatinine ratio was 378 done on 12/20/14      Medication List       This list is accurate as of: 02/27/15  9:22 PM.  Always use your most recent med list.               amLODipine 10 MG tablet  Commonly known as:  NORVASC  Take 10 mg by mouth daily.       Amlodipine-Valsartan-HCTZ 5-160-12.5 MG Tabs  Take by mouth.     aspirin 81 MG tablet  Take 81 mg by mouth daily.     atorvastatin 10 MG tablet  Commonly known as:  LIPITOR  Take 10 mg by mouth daily.     brimonidine 0.15 % ophthalmic solution  Commonly known as:  ALPHAGAN  Place 1 drop into both eyes 2 (two) times daily.     candesartan-hydrochlorothiazide 32-12.5 MG per tablet  Commonly known as:  ATACAND HCT  Take 1 tablet by mouth daily.     carvedilol 6.25 MG tablet  Commonly known as:  COREG  Take 6.25 mg by mouth 2 (two) times daily with a meal.     cholecalciferol 1000 UNITS tablet  Commonly known as:  VITAMIN D  Take 2,000 Units by mouth daily.     glucose blood test strip  Commonly known as:  BAYER CONTOUR NEXT TEST  Use as instructed to check blood sugar 2 times per day dx code E11.65     insulin NPH Human 100 UNIT/ML injection  Commonly known as:  HUMULIN N,NOVOLIN N  Inject into the skin 2 (two) times daily. 30 units in am and 26 units in pm     multivitamin with minerals tablet  Take 1 tablet by mouth daily.     NOVOLIN R 100 units/mL injection  Generic drug:  insulin regular  INJECT 10 UNITS EVERY MORNING BEFORE BREAKFAST, 5 UNITS BEFORE LUNCH, AND 10 UNITS BEFORE SUPPER     Travoprost (BAK Free) 0.004 % Soln ophthalmic solution  Commonly known as:  TRAVATAN  Place 1 drop into both eyes at bedtime.     triamcinolone cream 0.1 %  Commonly known as:  KENALOG     valsartan-hydrochlorothiazide 160-12.5 MG per tablet  Commonly known as:  DIOVAN-HCT     vitamin C 500 MG tablet  Commonly known as:  ASCORBIC ACID  Take 1,000 mg by mouth daily.        Allergies: No Known Allergies  Past Medical History  Diagnosis Date  . Enlarged prostate   . Diabetes mellitus   . Hypertension   . Chronic kidney disease   . Anemia     Past Surgical History  Procedure Laterality Date  . Shoulder surgery      Family History  Problem Relation Age of  Onset  . Hypertension Mother   . Heart disease Sister   . Stroke Sister   . Diabetes Brother     Social History:  reports that he has never smoked. He does not have any smokeless tobacco history on file. He reports that he does not drink alcohol or use illicit drugs.    Review of Systems    Lipid history: Has been on medication for a few years with Lipitor and was told to stop his OTC niacin   Lab Results  Component Value Date   HDL 37 08/21/2014   LDLCALC 87 08/21/2014   TRIG 217* 08/21/2014          Eyes: no history of blurred vision.  Most recent eye exam was in 08/2014   Has no numbness, burning, pains or tingling in feet   Diabetic foot exam done in 7/16      Physical Examination:  BP 170/62 mmHg  Pulse 63  Temp(Src) 97.9 F (36.6 C)  Resp 16  Ht 6\' 3"  (1.905 m)  Wt 255 lb 3.2 oz (115.758 kg)  BMI 31.90 kg/m2  SpO2 96%   Thyroid is enlarged about 2 times normal on the left side, slightly firm and smooth.   right medial lobe just palpable      ASSESSMENT:  Diabetes type 2, uncontrolled    He has had long-standing diabetes and is insulin deficient See history of present illness for detailed discussion of his current management, blood sugar patterns and problems identified  Currently on NPH and with adding  Regular Insulin small doses his blood sugars have been progressively improving especially at lunch and suppertime He is now starting get hypoglycemic before supper time recently Most likely is responding better with treating his glucose toxicity. He is generally active also especially in the mornings which may be helping He has made some changes in his diet with discussion with nurse educator but surprisingly has gained a significant amount of weight  GOITER: He needs to have a TSH done to evaluate his goiter which is mostly left-sided Discussed that he may need ultrasound also  PLAN:   Stop lunchtime insulin  Increase bedtime dose by 2 units to  improve fasting readings further  Reduce morning NPH by 2 units because of his tendency to lower readings in the daytime  Discussed reducing regular insulin in the morning when leading to be active  He does need to start checking blood sugars after supper because he has not done any and not clear if he is getting adequate insulin to cover his suppertime dose.  Discussed principles of adjusting suppertime regular based on meal size and postprandial target of at least under 180  May reduce frequency of monitoring during the day  Patient Instructions  Stop Regular insulin at lunch  N insulin 30 at bedtime and 28 in am  If active in am reduce R insulin to 6-7 units  If sugar at 8pm is mostly > 180 then use 7-8 units of R insulin at supper  Check blood sugars on waking up .Marland Kitchen4-5  .Marland Kitchen times a week Also check blood sugars about 2 hours after a meal and do this after different meals by rotation  Recommended blood sugar levels on waking up is 90-130 and about 2 hours after meal is 140-180 Please bring blood sugar monitor to each visit.    Counseling time on subjects discussed above is over 50% of today's 25 minute visit  Meghana Tullo  02/27/2015, 9:22 PM   Note: This office note was prepared with Dragon voice recognition system technology. Any transcriptional errors that result from this process are unintentional.

## 2015-03-02 ENCOUNTER — Encounter: Payer: Self-pay | Admitting: *Deleted

## 2015-03-05 ENCOUNTER — Telehealth: Payer: Self-pay | Admitting: Endocrinology

## 2015-03-05 ENCOUNTER — Other Ambulatory Visit: Payer: Self-pay | Admitting: *Deleted

## 2015-03-05 DIAGNOSIS — I10 Essential (primary) hypertension: Secondary | ICD-10-CM | POA: Diagnosis not present

## 2015-03-05 DIAGNOSIS — R600 Localized edema: Secondary | ICD-10-CM | POA: Diagnosis not present

## 2015-03-05 DIAGNOSIS — I739 Peripheral vascular disease, unspecified: Secondary | ICD-10-CM | POA: Diagnosis not present

## 2015-03-05 DIAGNOSIS — I2510 Atherosclerotic heart disease of native coronary artery without angina pectoris: Secondary | ICD-10-CM | POA: Diagnosis not present

## 2015-03-05 MED ORDER — NOVOLIN R 100 UNIT/ML IJ SOLN: INTRAMUSCULAR | Status: DC

## 2015-03-05 NOTE — Telephone Encounter (Signed)
rx sent

## 2015-03-05 NOTE — Telephone Encounter (Signed)
Patient called and would like a refill on his Rx  Rx: Novolin Insulin   Pharmacy: [facility]   Thank you

## 2015-03-20 DIAGNOSIS — R600 Localized edema: Secondary | ICD-10-CM | POA: Diagnosis not present

## 2015-03-20 DIAGNOSIS — I10 Essential (primary) hypertension: Secondary | ICD-10-CM | POA: Diagnosis not present

## 2015-03-22 DIAGNOSIS — R600 Localized edema: Secondary | ICD-10-CM | POA: Diagnosis not present

## 2015-03-22 DIAGNOSIS — I739 Peripheral vascular disease, unspecified: Secondary | ICD-10-CM | POA: Diagnosis not present

## 2015-03-23 DIAGNOSIS — E042 Nontoxic multinodular goiter: Secondary | ICD-10-CM | POA: Diagnosis not present

## 2015-03-30 DIAGNOSIS — I10 Essential (primary) hypertension: Secondary | ICD-10-CM | POA: Diagnosis not present

## 2015-03-30 DIAGNOSIS — I5032 Chronic diastolic (congestive) heart failure: Secondary | ICD-10-CM | POA: Diagnosis not present

## 2015-03-30 DIAGNOSIS — R079 Chest pain, unspecified: Secondary | ICD-10-CM | POA: Diagnosis not present

## 2015-03-30 DIAGNOSIS — R600 Localized edema: Secondary | ICD-10-CM | POA: Diagnosis not present

## 2015-04-02 ENCOUNTER — Telehealth: Payer: Self-pay | Admitting: Endocrinology

## 2015-04-02 NOTE — Telephone Encounter (Signed)
Patient is agreeable, he does have an appt with you on the 15th.

## 2015-04-02 NOTE — Telephone Encounter (Signed)
Thyroid ultrasound shows a relatively small nodule on the right but nodule in the left is over 1 inch in size , recommend needle aspiration under local anesthesia to rule out  The small chance of malignancy. If he agrees will need to do it  at [place].

## 2015-04-03 ENCOUNTER — Other Ambulatory Visit: Payer: Self-pay | Admitting: Endocrinology

## 2015-04-03 DIAGNOSIS — E041 Nontoxic single thyroid nodule: Secondary | ICD-10-CM

## 2015-04-03 NOTE — Telephone Encounter (Signed)
Ultrasound biopsy ordered

## 2015-04-05 ENCOUNTER — Ambulatory Visit
Admission: RE | Admit: 2015-04-05 | Discharge: 2015-04-05 | Disposition: A | Discharge status: Home / Self Care | Payer: Self-pay | Source: Ambulatory Visit | Attending: Endocrinology | Admitting: Endocrinology

## 2015-04-05 ENCOUNTER — Other Ambulatory Visit (INDEPENDENT_AMBULATORY_CARE_PROVIDER_SITE_OTHER): Payer: Medicare Other

## 2015-04-05 ENCOUNTER — Other Ambulatory Visit: Payer: Self-pay | Admitting: Endocrinology

## 2015-04-05 DIAGNOSIS — E041 Nontoxic single thyroid nodule: Secondary | ICD-10-CM

## 2015-04-05 DIAGNOSIS — IMO0002 Reserved for concepts with insufficient information to code with codable children: Secondary | ICD-10-CM

## 2015-04-05 DIAGNOSIS — E1165 Type 2 diabetes mellitus with hyperglycemia: Secondary | ICD-10-CM | POA: Diagnosis not present

## 2015-04-05 LAB — BASIC METABOLIC PANEL
BUN: 61 mg/dL — ABNORMAL HIGH (ref 6–23)
CALCIUM: 9 mg/dL (ref 8.4–10.5)
CO2: 19 mEq/L (ref 19–32)
Chloride: 112 mEq/L (ref 96–112)
Creatinine, Ser: 2.72 mg/dL — ABNORMAL HIGH (ref 0.40–1.50)
GFR: 24.25 mL/min — AB (ref >60.00)
GLUCOSE: 104 mg/dL — AB (ref 70–99)
Potassium: 5.1 mEq/L (ref 3.5–5.1)
SODIUM: 139 meq/L (ref 135–145)

## 2015-04-05 LAB — HEMOGLOBIN A1C: Hgb A1c MFr Bld: 8 % — ABNORMAL HIGH (ref 4.6–6.5)

## 2015-04-06 ENCOUNTER — Other Ambulatory Visit: Payer: Medicare Other

## 2015-04-06 DIAGNOSIS — I2510 Atherosclerotic heart disease of native coronary artery without angina pectoris: Secondary | ICD-10-CM | POA: Diagnosis not present

## 2015-04-06 DIAGNOSIS — R0789 Other chest pain: Secondary | ICD-10-CM | POA: Diagnosis not present

## 2015-04-06 DIAGNOSIS — R0602 Shortness of breath: Secondary | ICD-10-CM | POA: Diagnosis not present

## 2015-04-11 ENCOUNTER — Encounter: Payer: Self-pay | Admitting: Endocrinology

## 2015-04-11 ENCOUNTER — Ambulatory Visit (INDEPENDENT_AMBULATORY_CARE_PROVIDER_SITE_OTHER): Payer: Medicare Other | Admitting: Endocrinology

## 2015-04-11 VITALS — BP 134/60 | HR 61 | Temp 98.3°F | Resp 16 | Ht 75.0 in | Wt 251.6 lb

## 2015-04-11 DIAGNOSIS — Z23 Encounter for immunization: Secondary | ICD-10-CM | POA: Diagnosis not present

## 2015-04-11 DIAGNOSIS — N184 Chronic kidney disease, stage 4 (severe): Secondary | ICD-10-CM

## 2015-04-11 DIAGNOSIS — E1165 Type 2 diabetes mellitus with hyperglycemia: Secondary | ICD-10-CM | POA: Diagnosis not present

## 2015-04-11 DIAGNOSIS — IMO0002 Reserved for concepts with insufficient information to code with codable children: Secondary | ICD-10-CM

## 2015-04-11 DIAGNOSIS — E041 Nontoxic single thyroid nodule: Secondary | ICD-10-CM | POA: Diagnosis not present

## 2015-04-11 NOTE — Progress Notes (Signed)
Patient ID: Ray Merritt, male   DOB: 04/01/1936, 79 y.o.   MRN: [id number]           Reason for Appointment: Follow-up for Type 2 Diabetes  Referring physician: Fulp  History of Present Illness:          Date of diagnosis of type 2 diabetes mellitus : 1982        Background history:   He has been on insulin since the time of diagnosis, initially his blood sugars were significantly high ; he was symptomatic with dry mouth and blurred vision. He thinks he was probably taking NPH and Regular Insulin for quite some time and before his consultation was only on NPH 3 years ago he was put on Bydureon weekly in addition to NPH.  He thinks that it initially curb his appetite but he does not think he lost any significant amount of weight.  Also he does not think his blood sugar control was much better For about a year he has been switched from Bydureon to Tanzeum weekly A1c has been as high as 11.8 in 2/16  Recent history:   INSULIN regimen is described as: Novolin 27 units acb and 30 units bedtime , Regular Insulin 10 units before breakfast and 7 supper     On his initial consultation because of high postprandial readings he was started on Regular Insulin before each meal  His baseline A1c in 6/16 was 9.4.    His  Tanzeum was stopped because of lack of benefit He was also seen by nurse educator for basic diabetes education His lunchtime insulin was stopped on the last visit and morning NPH reduced slightly also because of low sugars  Current blood sugar patterns and problems identified:  He is checking his blood sugars as directed up to 4 times a day  His blood sugars have fluctuated significantly over the last month but have been somewhat better overall in the last 2 weeks  He is again starting to get low blood sugars in the afternoons this week and not clear why  He is also having low normal readings at all different times and lowest glucose was 67 this morning at breakfast  He  does not think he has changed his diet significantly although his weight is slightly better  Does not have any formal exercise but is relatively active in the afternoons  Other hypoglycemic drugs the patient is taking are:    none Side effects from medications have been: None  Compliance with the medical regimen: Good Hypoglycemia: with activity symptoms are weakness and feeling sweaty, treated with juice or other sweet drinks   Glucose monitoring:  done 3 times a day         Glucometer:  Contour      Blood Glucose readings by   Mean values apply above for all meters except median for One Touch  PRE-MEAL Fasting Lunch Dinner Bedtime Overall  Glucose range:  67-309   56-317   49-206   60-241    Mean/median:  151   174   86   119   131+/-67    POST-MEAL PC Breakfast PC Lunch PC Dinner  Glucose range:   69-155  Mean/median:       Self-care: The diet that the patient has been following is: tries to limit portions .     Typical meal intake: Breakfast is old-fashioned oatmeal, lunch is usually a sandwich.  He has mostly popcorn for snacks   6-7  Dietician visit, most recent: none  CDE: 7/16               Exercise:  some walking, mostly gardening activities  Weight history: 232  Wt Readings from Last 3 Encounters:  04/11/15 251 lb 9.6 oz (114.125 kg)  02/27/15 255 lb 3.2 oz (115.758 kg)  01/25/15 244 lb 12.8 oz (111.041 kg)    Glycemic control:   Lab Results  Component Value Date   HGBA1C 8.0* 04/05/2015   HGBA1C 9.4* 12/20/2014   Lab Results  Component Value Date   LDLCALC 87 08/21/2014   CREATININE 2.72* 04/05/2015   Microalbumin/creatinine ratio was 378 done on 12/20/14      Medication List       This list is accurate as of: 04/11/15 12:47 PM.  Always use your most recent med list.               aspirin 81 MG tablet  Take 81 mg by mouth daily.     atorvastatin 10 MG tablet  Commonly known as:  LIPITOR  Take 10 mg by mouth daily.      brimonidine 0.15 % ophthalmic solution  Commonly known as:  ALPHAGAN  Place 1 drop into both eyes 2 (two) times daily.     carvedilol 6.25 MG tablet  Commonly known as:  COREG  Take 6.25 mg by mouth 2 (two) times daily with a meal.     cholecalciferol 1000 UNITS tablet  Commonly known as:  VITAMIN D  Take 2,000 Units by mouth daily.     glucose blood test strip  Commonly known as:  BAYER CONTOUR NEXT TEST  Use as instructed to check blood sugar 2 times per day dx code E11.65     insulin NPH Human 100 UNIT/ML injection  Commonly known as:  HUMULIN N,NOVOLIN N  Inject into the skin 2 (two) times daily. 30 units in am and 26 units in pm     multivitamin with minerals tablet  Take 1 tablet by mouth daily.     multivitamin with minerals Tabs tablet  Take 1 tablet by mouth daily.     NOVOLIN R 100 units/mL injection  Generic drug:  insulin regular  INJECT 10 UNITS EVERY MORNING BEFORE BREAKFAST, 5 UNITS BEFORE LUNCH, AND 10 UNITS BEFORE SUPPER     Travoprost (BAK Free) 0.004 % Soln ophthalmic solution  Commonly known as:  TRAVATAN  Place 1 drop into both eyes at bedtime.     triamcinolone cream 0.1 %  Commonly known as:  KENALOG     valsartan-hydrochlorothiazide 160-12.5 MG per tablet  Commonly known as:  DIOVAN-HCT     vitamin C 500 MG tablet  Commonly known as:  ASCORBIC ACID  Take 1,000 mg by mouth daily.        Allergies: No Known Allergies  Past Medical History  Diagnosis Date  . Enlarged prostate   . Diabetes mellitus   . Hypertension   . Chronic kidney disease   . Anemia     Past Surgical History  Procedure Laterality Date  . Shoulder surgery      Family History  Problem Relation Age of Onset  . Hypertension Mother   . Heart disease Sister   . Stroke Sister   . Diabetes Brother     Social History:  reports that he has never smoked. He does not have any smokeless tobacco history on file. He reports that he does not drink alcohol or use illicit  drugs.    Review of Systems   THYROID nodule: He has a dominant left-sided thyroid nodule and is being referred for biopsy.   Lipid history: Has been on medication for a few years with Lipitor   Lab Results  Component Value Date   HDL 37 08/21/2014   LDLCALC 87 08/21/2014   TRIG 217* 08/21/2014          Eyes: no history of blurred vision.  Most recent eye exam was in 08/2014   Has no numbness, burning, pains or tingling in feet   Diabetic foot exam done in 7/16  RENAL dysfunction: He is followed by nephrologist but appears to have relatively lower creatinine clearance now.  He thinks his blood pressure medications have been adjusted by cardiologist  Lab Results  Component Value Date   CREATININE 2.72* 04/05/2015   CREATININE 2.4* 12/13/2014   CREATININE 2.2* 01/27/2014     Physical Examination:  BP 134/60 mmHg  Pulse 61  Temp(Src) 98.3 F (36.8 C)  Resp 16  Ht 6\' 3"  (1.905 m)  Wt 251 lb 9.6 oz (114.125 kg)  BMI 31.45 kg/m2  SpO2 98%  Standing blood pressure 140/64 No edema present    ASSESSMENT:  Diabetes type 2, uncontrolled    He has had long-standing diabetes and is insulin deficient See history of present illness for detailed discussion of his current management, blood sugar patterns and problems identified  Currently on NPH and Regular Insulin at breakfast and supper His blood sugars as discussed above have been fluctuating significantly and not clear why His weight is slightly better and he may be overall doing better on his diet also He is not exercising enough to cause low blood sugars but his blood sugars are recently dropping again in the afternoon without taking any lunchtime coverage when he is eating a sandwich His blood sugar fluctuation may be partly related to taking NPH and Generic regular insulin  THYROID nodule: Discussed indications of solitary nodules and need for evaluation with needle aspiration.  Discussed the procedure and expected  outcome.  Order placed for thyroid needle aspiration to be done  PLAN:   Reduce insulin doses as below  To try and have an afternoon snack if very active  Balanced meals with protein  Call if blood sugars are consistently high or low  Follow-up with Dr. Florene Glen regarding worsening renal function  Patient Instructions  Novolin 20 units acb and 26 units bedtime , Regular Insulin 6 units before breakfast and 5 at supper    Influenza vaccine given  Counseling time on subjects discussed above is over 50% of today's 25 minute visi  Hend Mccarrell 04/11/2015, 12:47 PM   Note: This office note was prepared with Dragon voice recognition system technology. Any transcriptional errors that result from this process are unintentional.

## 2015-04-11 NOTE — Patient Instructions (Signed)
Novolin 20 units acb and 26 units bedtime , Regular Insulin 6 units before breakfast and 5 at supper

## 2015-04-16 ENCOUNTER — Telehealth: Payer: Self-pay

## 2015-04-16 ENCOUNTER — Telehealth: Payer: Self-pay | Admitting: Endocrinology

## 2015-04-16 NOTE — Telephone Encounter (Signed)
Pt has been contacted and asked to call insurance company to find out who is in network to do his biopsy

## 2015-04-16 NOTE — Telephone Encounter (Signed)
Please see below.

## 2015-04-16 NOTE — Telephone Encounter (Signed)
April from [facility] calling to let Elnoria Howard know that thyroid biopsies are done at [facility]

## 2015-04-17 NOTE — Telephone Encounter (Signed)
Please forward this to the referral department at [facility] to sort out

## 2015-04-17 NOTE — Telephone Encounter (Signed)
This pt insurance company stated that Ray Merritt could do his thyroid biopsy but April at [facility] stated that [facility] could do it and they can't due to that not being covered by the [employer]

## 2015-04-19 ENCOUNTER — Telehealth: Payer: Self-pay | Admitting: Endocrinology

## 2015-04-19 NOTE — Telephone Encounter (Signed)
Waiting on Dr.Kumar recommendation due to radiology department not being able to do  the biopsy

## 2015-04-19 NOTE — Telephone Encounter (Signed)
Pt calling to schedule the biopsy-let him know the referral has been sent to radiology dept but would inquire with Annasha to see if we can get this scheduled

## 2015-04-19 NOTE — Telephone Encounter (Signed)
It is up to the patient and his insurance company to figure out where he can get it done.  You can call the [facility] radiology office to see where they get their biopsies

## 2015-04-20 ENCOUNTER — Inpatient Hospital Stay: Admit: 2015-04-20 | Payer: Self-pay | Admitting: Cardiology

## 2015-04-20 DIAGNOSIS — R079 Chest pain, unspecified: Secondary | ICD-10-CM | POA: Diagnosis not present

## 2015-04-20 DIAGNOSIS — I5032 Chronic diastolic (congestive) heart failure: Secondary | ICD-10-CM | POA: Diagnosis not present

## 2015-04-20 DIAGNOSIS — I2510 Atherosclerotic heart disease of native coronary artery without angina pectoris: Secondary | ICD-10-CM | POA: Diagnosis not present

## 2015-04-20 DIAGNOSIS — R600 Localized edema: Secondary | ICD-10-CM | POA: Diagnosis not present

## 2015-04-23 DIAGNOSIS — N183 Chronic kidney disease, stage 3 (moderate): Secondary | ICD-10-CM | POA: Diagnosis not present

## 2015-04-23 DIAGNOSIS — E119 Type 2 diabetes mellitus without complications: Secondary | ICD-10-CM | POA: Diagnosis not present

## 2015-04-23 DIAGNOSIS — Z794 Long term (current) use of insulin: Secondary | ICD-10-CM | POA: Diagnosis not present

## 2015-04-23 DIAGNOSIS — D638 Anemia in other chronic diseases classified elsewhere: Secondary | ICD-10-CM | POA: Diagnosis not present

## 2015-04-23 DIAGNOSIS — J069 Acute upper respiratory infection, unspecified: Secondary | ICD-10-CM | POA: Diagnosis not present

## 2015-04-23 DIAGNOSIS — E785 Hyperlipidemia, unspecified: Secondary | ICD-10-CM | POA: Diagnosis not present

## 2015-04-23 DIAGNOSIS — I10 Essential (primary) hypertension: Secondary | ICD-10-CM | POA: Diagnosis not present

## 2015-04-24 DIAGNOSIS — N183 Chronic kidney disease, stage 3 (moderate): Secondary | ICD-10-CM | POA: Diagnosis not present

## 2015-04-24 DIAGNOSIS — R079 Chest pain, unspecified: Secondary | ICD-10-CM | POA: Diagnosis not present

## 2015-04-25 DIAGNOSIS — E875 Hyperkalemia: Secondary | ICD-10-CM | POA: Diagnosis not present

## 2015-04-29 DIAGNOSIS — E1159 Type 2 diabetes mellitus with other circulatory complications: Secondary | ICD-10-CM

## 2015-04-29 DIAGNOSIS — I209 Angina pectoris, unspecified: Secondary | ICD-10-CM

## 2015-04-29 NOTE — H&P (Signed)
OFFICE VISIT NOTES COPIED TO EPIC FOR DOCUMENTATION   Ray Merritt. [facility] 04/20/2015 8:12 AM Location: [facility] Patient #: [id number] DOB: [birth year] Married / Language: English / Race: Black or African American Male   History of Present Illness Ray Page MD; 04/21/2015 8:51 AM) Patient words: f/u for test results; Pt denies any new symptoms or concerns since his last o/v.  The patient is a 79 year old male who presents for a follow-up for CAD. He has h/o coronary artery disease, CT scan of the abdomen detected incidental severe left main and LAD and circumflex coronary calcification in March 2013. He has history of hypertension, hyperlipidemia, uncontrolled diabetes mellitus and peripheral arterial disease in the form of small vessel disease below the knee. He also has chronic renal insufficiency stage 3 to 4. He had undergone stress testing in April 2013 which had revealed normal LVEF, no evidence of ischemia. Echocardiogram revealed LVEF 50%, cannot exclude mid inferior and inferoseptal hypokinesis.  He is now here in the office for follow-up of chest pain suggestive of angina pectoris, hypertension control.  Recently over the past one to 2 months he had noticed exertional chest tightness associated with worsening dyspnea. In spite of can medical therapy, his symptoms have not resolved.  He also symptoms of claudication involving bilateral calves right leg worse than the left. No change since last office visit a month ago.  Since discontinuation of amlodipine, I had treated him with furosemide for one week and his leg edema has completely resolved. This is remained stable over the past 6 weeks. He is also tolerating Coreg and also Imdur without any side effects.   Problem List/Past Medical Ray Merritt; 04/20/2015 9:13 AM) Coronary artery calcification seen on CAT scan (I25.10) H/O CT angio abdomen, incidentally revealing severe LM, LAD and Circumflex coronary  calcificaiton: Doing well and on appropriate medical therapy. No angina, dyspnea or CHF on exam. Continue present medical therpy. Lexiscan stress 10/31/11: Diaphragmatic and gut uptake artifact. Apical thinning. Normal LVEF. Low risk. Benign essential HTN (I10) Uncontrolled type 2 diabetes mellitus with chronic kidney disease, unspecified CKD stage, unspecified long term insulin use status (E11.22, E11.65) Labs 12/20/2014: HbA1c 9.4%, serum glucose 317, BUN 41, creatinine 2.20, eGFR 35, potassium 4.7, positive microalbuminuria Peripheral artery disease (I73.9) Lower extremity arterial duplex 03/22/2015: No hemodynamically significant stenoses are identified in the right lower extremity arterial system. Bilateral deep formal arteries show biphasic waveforms with < 50% stenosis. Moderate velocity increase at the left posterior tibial artery suggestive of >50% stenosis with diffuse disease in the AT. This exam reveals normal perfusion of both the lower extremities with ABI of 1.01. No significant change from 04/22/07. Carotid duplex (different dates): 02/17/06 and 02/17/05: Tortuous Right ICA. No stenosis. Does not appear to have right subclavian stenosis. Normal vertebral flow pattern. Bilateral edema of lower extremity (R60.0) Echo- 03/20/2015 1. Left ventricle cavity is normal in size. Mild concentric hypertrophy of the left ventricle. Low normal decrease in global wall motion. Doppler evidence of grade I (impaired) diastolic dysfunction. Left ventricle regional wall motion findings: No wall motion abnormalities. Calculated EF 51%. 2. Left atrial cavity is mildly dilated. 3. Mild aortic valve leaflet calcification. 4. Mild to moderate mitral regurgitation. 5. Mild tricuspid regurgitation. No evidence of pulmonary hypertension. Diabetes mellitus with stage 4 chronic kidney disease (E11.22) CMP 12/13/2014: BUN 40, serum creatinine 2.4, eGFR 25 mL. CBC revealed a hemoglobin of 11.3/hematocrit 33.0, normal platelet  count. Chronic diastolic heart failure (P91.50) Chest pain, exertional (  R07.9) Hypertension Type II Diabetes Mellitus (controlled) Enlarged prostate Hyperlipidemia Glaucoma Pure hypercholesterolemia (272.0)  Allergies Ray Merritt; May 08, 2015 9:13 AM) No Known Drug Allergies10/17/2013  Family History Ray Merritt; May 08, 2015 9:13 AM) Mother Deceased. at age 29 from natural causes; no heart attacks or strokes, no cardiovascular conditions Father Deceased. at age 61 from "black lung"; no heart attacks or strokes or any other cardiovascular conditions Siblings In stable health. 14 siblings total- one sister with CABG at age 40.  Social History Ray Merritt; 05-08-2015 9:13 AM) Current tobacco use Former smoker. quit in 1973 Non Drinker/No Alcohol Use Marital status Married. Living Situation Lives with spouse. Number of Children 3.  Past Surgical History Ray Merritt; 05-08-2015 9:13 AM) Rotator Cuff Surgery 2002 Bilateral. previous surgery in 2001  Medication History Ray Merritt; 05-08-15 9:21 AM) HydrALAZINE HCl (50MG Tablet, 1 (one) Tablet Oral three times daily, Taken starting 03/30/2015) Active. Isosorbide Mononitrate ER (60MG Tablet ER 24HR, 1 (one) Tablet ER 24HR Oral daily, Taken starting 03/30/2015) Active. Carvedilol (12.5MG Tablet, 1 (one) Tablet Tablet Oral two times daily, Taken starting 03/05/2015) Active. Furosemide (40MG Tablet, 1 (one) Tablet Tablet Oral every morning for 1 week then as needed only, Taken starting 03/05/2015) Discontinued: Completed Course. (Discontinue Corge 6.80m, increase to 12.5 mg BID, D/C amlodipine-edema) Lipitor (10MG Tablet, 1 Oral daily) Active. Vitamin C (1000MG Tablet, 1 Oral daily) Active. Acetaminophen PM (500-25MG Tablet, 1 Oral every night) Active. Centrum Silver (1 Oral daily) Active. Fish Oil (1000MG Capsule, 1 Oral daily) Active. Vitamin D3 (2000UNIT Tablet, 1 Oral daily) Active. Aspirin  (81MG Tablet, 1 Oral daily) Active. Travatan Z (0.004% Solution, 1drop in each eye Ophthalmic daily) Active. Brimonidine Tartrate (0.15% Solution, 1 drop in each eye Ophthalmic two times daily) Active. Valsartan-Hydrochlorothiazide (160-12.5MG Tablet, 1 Oral daily) Active. NovoLIN R (100UNIT/ML Solution, 6 units before breakfast Injection 5 units before supper) Active. NovoLIN N (100UNIT/ML Suspension, 20 units in the am Subcutaneous 26 units in the pm) Active. Triamcinolone Acetonide (0.1% Cream, apply External daily) Active. Medications Reconciled  Diagnostic Studies History ([place]; 918-Oct-20169:21 AM) Nuclear stress test09/16/2016 1. The resting electrocardiogram demonstrated normal sinus rhythm, normal resting conduction, no resting arrhythmias and normal rest repolarization. Stress EKG is non-diagnostic for ischemia as it a pharmacologic stress using Lexiscan. Stress symptoms included dyspnea and chest heaviness. 2. The LV is dilated in stress images with LV end diastolic volume of 2562mL. SPECT images demonstrate Medium perfusion abnormality of mild intensity in the basal inferior, mid inferior and apical inferior myocardial wall(s) on the stress images. The defect is present on the resting images consistent with prior myocardial infarction. The left ventricular ejection fraction was calculated or visually estimated to be 47%. This is high risk study, clinical correlation recommended. Balanced ischemia and multivessel CAD cannot be excluded. Lexiscan stress 10/31/11: Diaphragmatic and gut uptake artifact. Apical thinning. Normal LVEF. Low risk. ECG 10/15/11: NSR @ 71/min. Normal intervals. No ischemia. normal EKG. Colonoscopy 2009 (normal) Echo 10/20/11: Low normal LVEF 50%. CRO basal to mid inferior, inferoseptal hypokinesis. Trace MR. Mild left atrial enlargement. Nuclear stress test 8/07: No ischemia. EF 48%. Low risk study LE Arterial Dopplers (different dates): Bilateral  normal ABI. Occluded anterior tibials bilat. Carotid duplex (different dates): 02/17/06 and 02/17/05: Tortuous Right ICA. No stenosis. Does not appear to have right subclavian stenosis. Normal vertebral flow pattern. Stress cardiolite 2006. Echo 2006. Carotid duplex 2006. Colonoscopy2015 Echocardiogram08/30/2016 1. Left ventricle cavity is normal in size. Mild concentric hypertrophy of the left ventricle. Low normal  decrease in global wall motion. Doppler evidence of grade I (impaired) diastolic dysfunction. Left ventricle regional wall motion findings: No wall motion abnormalities. Calculated EF 51%. 2. Left atrial cavity is mildly dilated. 3. Mild aortic valve leaflet calcification. 4. Mild to moderate mitral regurgitation. 5. Mild tricuspid regurgitation. No evidence of pulmonary hypertension. Lower Extremity Dopplers09/07/2014 No hemodynamically significant stenoses are identified in the right lower extremity arterial system. Bilateral deep formal arteries show biphasic waveforms with < 50% stenosis. Moderate velocity increase at the left posterior tibial artery suggestive of >50% stenosis with diffuse disease in the AT. This exam reveals normal perfusion of both the lower extremities with ABI of 1.01.  Other Problems Ray Merritt; 04/20/2015 9:13 AM) CT Abdomen/pelvis 09/30/11    Review of Systems Ray Page MD; 04/20/2015 10:17 AM) General Present- Weight Loss > 10lbs.. Not Present- Anorexia, Fatigue and Fever. Respiratory Present- Decreased Exercise Tolerance and Difficulty Breathing on Exertion. Not Present- Cough, Dyspnea and Wheezing. Cardiovascular Present- Chest Pain, Claudications, Difficulty Breathing On Exertion and Edema (Improved since last OV.). Not Present- Orthopnea, Paroxysmal Nocturnal Dyspnea and Shortness of Breath. Gastrointestinal Not Present- Change in Bowel Habits, Constipation and Nausea. Neurological Not Present- Focal Neurological Symptoms. Endocrine  Not Present- Appetite Changes, Cold Intolerance and Heat Intolerance. Hematology Not Present- Anemia, Petechiae and Prolonged Bleeding.  Vitals Ray Merritt; 04/20/2015 9:22 AM) 04/20/2015 9:15 AM Weight: 247.38 lb Height: 75in Body Surface Area: 2.4 m Body Mass Index: 30.92 kg/m  Pulse: 66 (Regular)  P.OX: 94% (Room air) BP: 148/58 (Sitting, Left Arm, Standard)       Physical Exam Ray Page, MD; 04/21/2015 8:53 AM) General Mental Status-Alert. General Appearance-Cooperative, Appears stated age, Not in acute distress. Orientation-Oriented X3. Build & Nutrition-Well built and Mildly obese.  Head and Neck Thyroid Gland Characteristics - no palpable nodules, no palpable enlargement.  Chest and Lung Exam Palpation Tender - No chest wall tenderness. Auscultation Breath sounds - Clear.  Cardiovascular Cardiovascular examination reveals -normal heart sounds, regular rate and rhythm with no murmurs and carotid auscultation reveals no bruits. Inspection Jugular vein - Right - No Distention.  Abdomen Inspection Contour - Obese and Pannus present. Palpation/Percussion Normal exam - Non Tender and No hepatosplenomegaly. Auscultation Normal exam - Bowel sounds normal.  Peripheral Vascular Lower Extremity Inspection - Left - No Pigmentation, No Varicose veins. Right - No Pigmentation, No Varicose veins. Palpation - Edema - Bilateral - 1+ Pitting edema(Improved since last OV). Femoral pulse - Bilateral - Normal(soft right bruit). Popliteal pulse - Left - 2+. Right - Feeble. Dorsalis pedis pulse - Bilateral - Absent. Posterior tibial pulse - Bilateral - Normal. Abdomen-No prominent abdominal aortic pulsation, No epigastric bruit.  Neurologic Motor-Grossly intact without any focal deficits.  Musculoskeletal - Did not examine.    Assessment & Plan Ray Page MD; 04/21/2015 8:53 AM) Chest pain, exertional (R07.9) Story: Lexiscan  myoview stress test 04/06/2015: 1. The resting electrocardiogram demonstrated normal sinus rhythm, normal resting conduction, no resting arrhythmias and normal rest repolarization. Stress EKG is non-diagnostic for ischemia as it a pharmacologic stress using Lexiscan. Stress symptoms included dyspnea and chest heaviness. 2. The LV is dilated in stress images with LV end diastolic volume of 237 mL. SPECT images demonstrate Medium perfusion abnormality of mild intensity in the basal inferior, mid inferior and apical inferior myocardial wall(s) on the stress images. The defect is present on the resting images consistent with prior myocardial infarction. The left ventricular ejection fraction was calculated or visually estimated to be  47%. This is high risk study, clinical correlation recommended. Balanced ischemia and multivessel CAD cannot be excluded.  Lexiscan stress 10/31/11: Diaphragmatic and gut uptake artifact. Apical thinning. Normal LVEF. Low risk. Current Plans METABOLIC PANEL, BASIC (83382) CBC & PLATELETS (AUTO) (85027) PT (PROTHROMBIN TIME) (50539) Chronic diastolic heart failure (J67.34) Story: Echo- 03/20/2015 1. Left ventricle cavity is normal in size. Mild concentric hypertrophy of the left ventricle. Low normal decrease in global wall motion. Doppler evidence of grade I (impaired) diastolic dysfunction. No wall motion abnormalities. Calculated EF 51%. 2. Left atrial cavity is mildly dilated. 3. Mild aortic valve leaflet calcification. 4. Mild to moderate mitral regurgitation. 5. Mild tricuspid regurgitation. No evidence of pulmonary hypertension. Bilateral edema of lower extremity - Status is Resolved (R60.0) Coronary artery calcification seen on CAT scan (I25.10) Story: H/O CT angio abdomen, incidentally revealing severe LM, LAD and Circumflex coronary calcificaiton: Doing well and on appropriate medical therapy. No angina, dyspnea or CHF on exam. Continue present medical  therpy. Impression: EKG 03/05/2015: Normal sinus rhythm at the rate of 60 bpm, normal axis, nonspecific T-wave flattening, PVC. No evidence of ischemia. No significant change from EKG 06/01/2012, non specific T flattening new. Uncontrolled type 2 diabetes mellitus with chronic kidney disease, unspecified CKD stage, unspecified long term insulin use status (E11.22) Story: Labs 12/20/2014: HbA1c 9.4%, serum glucose 317, BUN 41, creatinine 2.20, eGFR 35, potassium 4.7, positive microalbuminuria Hypertension Current Plans Discontinued Valsartan-Hydrochlorothiazide 160-12.5MG (change to Val HCT 160/25). Started Valsartan-Hydrochlorothiazide 160-25MG, 1 (one) Tablet every morning, #1, 90 days starting 04/20/2015, No Refill. Local Order: Discontinue Valsartan HCT 160/12.75m Started HydroCHLOROthiazide 12.5MG, 1 (one) Tablet every morning, #30, 04/20/2015, No Refill. Local Order: No ferill. Will use with Valsartan HCT 160/12.539mto makde Valsartan HCT 160/25 Diabetes mellitus with stage 4 chronic kidney disease (E11.22) Story: CMP 12/13/2014: BUN 40, serum creatinine 2.4, eGFR 25 mL. CBC revealed a hemoglobin of 11.3/hematocrit 33.0, normal platelet count. Current Plans Mechanism of underlying disease process and action of medications discussed with the patient. I discussed primary/secondary prevention and also dietary counceling was done. Patient here for follow-up and office visit for after recent stress testing 3 weeks ago I'd seen in the office, symptoms were atypical for angina pectoris and worsening dyspnea on exertion. Patient has high risk stress test, cannot exclude balanced ischemia, in spite of aggressive medical therapy continues to have exertional chest pain and worsening dyspnea.  We will set him up for coronary angiography. I'll admit the patient one day prior to the hospital for hydration and he is also advised to hold off on valsartan HCT.  Due to stage 3-4 chronic kidney disease, uncontrolled  hypertension, I increased HCT component of the valsartan HCT from 160/12.5 mg to 160/25 mg by mouth every morning. He will hold this one day prior to admission and 2 days prior to coronary angiogram. He sees Dr. AlErling CruzI'll keep him in the loop. Patient is aware of the risk of 3-5% acute on chronic kidney failure and probable need for dialysis.  Schedule for cardiac catheterization, and possible angioplasty. We discussed regarding risks, benefits, alternatives to this including stress testing, CTA and continued medical therapy. Patient wants to proceed. Understands <1-2% risk of death, stroke, MI, urgent CABG, bleeding, infection, renal failure but not limited to these. Video recording of the procedure shown to the patient.  CC: Dr. AlErling CruzCC: Dr. CaAntony Blackbird   Signed by JaLaverda PageMD (04/21/2015 8:54 AM)

## 2015-04-30 ENCOUNTER — Observation Stay (HOSPITAL_COMMUNITY)
Admission: RE | Admit: 2015-04-30 | Discharge: 2015-05-02 | Disposition: A | Discharge status: Home / Self Care | Payer: Medicare Other | Source: Ambulatory Visit | Attending: Cardiology | Admitting: Cardiology

## 2015-04-30 ENCOUNTER — Encounter (HOSPITAL_COMMUNITY): Payer: Self-pay | Admitting: *Deleted

## 2015-04-30 DIAGNOSIS — I25118 Atherosclerotic heart disease of native coronary artery with other forms of angina pectoris: Secondary | ICD-10-CM | POA: Diagnosis not present

## 2015-04-30 DIAGNOSIS — E1122 Type 2 diabetes mellitus with diabetic chronic kidney disease: Secondary | ICD-10-CM | POA: Diagnosis not present

## 2015-04-30 DIAGNOSIS — E1165 Type 2 diabetes mellitus with hyperglycemia: Secondary | ICD-10-CM | POA: Diagnosis not present

## 2015-04-30 DIAGNOSIS — E1151 Type 2 diabetes mellitus with diabetic peripheral angiopathy without gangrene: Secondary | ICD-10-CM | POA: Insufficient documentation

## 2015-04-30 DIAGNOSIS — Z7982 Long term (current) use of aspirin: Secondary | ICD-10-CM | POA: Insufficient documentation

## 2015-04-30 DIAGNOSIS — I5032 Chronic diastolic (congestive) heart failure: Secondary | ICD-10-CM | POA: Diagnosis not present

## 2015-04-30 DIAGNOSIS — Z87891 Personal history of nicotine dependence: Secondary | ICD-10-CM | POA: Diagnosis not present

## 2015-04-30 DIAGNOSIS — I25119 Atherosclerotic heart disease of native coronary artery with unspecified angina pectoris: Secondary | ICD-10-CM | POA: Diagnosis not present

## 2015-04-30 DIAGNOSIS — N184 Chronic kidney disease, stage 4 (severe): Secondary | ICD-10-CM | POA: Insufficient documentation

## 2015-04-30 DIAGNOSIS — H409 Unspecified glaucoma: Secondary | ICD-10-CM | POA: Insufficient documentation

## 2015-04-30 DIAGNOSIS — I10 Essential (primary) hypertension: Secondary | ICD-10-CM | POA: Diagnosis not present

## 2015-04-30 DIAGNOSIS — Z6830 Body mass index (BMI) 30.0-30.9, adult: Secondary | ICD-10-CM | POA: Diagnosis not present

## 2015-04-30 DIAGNOSIS — E7800 Pure hypercholesterolemia, unspecified: Secondary | ICD-10-CM | POA: Diagnosis not present

## 2015-04-30 DIAGNOSIS — I209 Angina pectoris, unspecified: Secondary | ICD-10-CM

## 2015-04-30 DIAGNOSIS — I2584 Coronary atherosclerosis due to calcified coronary lesion: Secondary | ICD-10-CM | POA: Insufficient documentation

## 2015-04-30 DIAGNOSIS — I130 Hypertensive heart and chronic kidney disease with heart failure and stage 1 through stage 4 chronic kidney disease, or unspecified chronic kidney disease: Secondary | ICD-10-CM | POA: Diagnosis not present

## 2015-04-30 DIAGNOSIS — E1159 Type 2 diabetes mellitus with other circulatory complications: Secondary | ICD-10-CM

## 2015-04-30 DIAGNOSIS — Z794 Long term (current) use of insulin: Secondary | ICD-10-CM | POA: Diagnosis not present

## 2015-04-30 DIAGNOSIS — I25810 Atherosclerosis of coronary artery bypass graft(s) without angina pectoris: Secondary | ICD-10-CM | POA: Diagnosis present

## 2015-04-30 DIAGNOSIS — Z79899 Other long term (current) drug therapy: Secondary | ICD-10-CM | POA: Diagnosis not present

## 2015-04-30 DIAGNOSIS — E669 Obesity, unspecified: Secondary | ICD-10-CM | POA: Insufficient documentation

## 2015-04-30 DIAGNOSIS — R0789 Other chest pain: Secondary | ICD-10-CM | POA: Diagnosis present

## 2015-04-30 LAB — GLUCOSE, CAPILLARY
Glucose-Capillary: 172 mg/dL — ABNORMAL HIGH (ref 65–99)
Glucose-Capillary: 166 mg/dL — ABNORMAL HIGH (ref 65–99)
Glucose-Capillary: 138 mg/dL — ABNORMAL HIGH (ref 65–99)

## 2015-04-30 LAB — BASIC METABOLIC PANEL
ANION GAP: 7 (ref 5–15)
BUN: 41 mg/dL — ABNORMAL HIGH (ref 6–20)
CALCIUM: 9.1 mg/dL (ref 8.9–10.3)
CHLORIDE: 105 mmol/L (ref 101–111)
CO2: 23 mmol/L (ref 22–32)
Creatinine, Ser: 2.6 mg/dL — ABNORMAL HIGH (ref 0.61–1.24)
GFR calc non Af Amer: 22 mL/min — ABNORMAL LOW (ref >60)
GFR, EST AFRICAN AMERICAN: 26 mL/min — AB (ref >60)
Glucose, Bld: 186 mg/dL — ABNORMAL HIGH (ref 65–99)
Potassium: 5.1 mmol/L (ref 3.5–5.1)
SODIUM: 135 mmol/L (ref 135–145)

## 2015-04-30 MED ORDER — SODIUM BICARBONATE 8.4 % IV SOLN
INTRAVENOUS | Status: DC
  Administered 2015-05-01: 11:00:00 via INTRAVENOUS
  Filled 2015-04-30: qty 1000

## 2015-04-30 MED ORDER — SODIUM CHLORIDE 0.9 % IV SOLN: 250.0000 mL | INTRAVENOUS | Status: DC | PRN

## 2015-04-30 MED ORDER — SODIUM BICARBONATE BOLUS VIA INFUSION: INTRAVENOUS | Status: DC

## 2015-04-30 MED ORDER — LATANOPROST 0.005 % OP SOLN
1.0000 [drp] | Freq: Every day | OPHTHALMIC | Status: DC
  Administered 2015-04-30 – 2015-05-01 (×2): 1 [drp] via OPHTHALMIC
  Filled 2015-04-30: qty 2.5

## 2015-04-30 MED ORDER — ALPRAZOLAM 0.25 MG PO TABS: 0.2500 mg | ORAL_TABLET | Freq: Two times a day (BID) | ORAL | Status: DC | PRN

## 2015-04-30 MED ORDER — ATORVASTATIN CALCIUM 10 MG PO TABS
10.0000 mg | ORAL_TABLET | Freq: Every day | ORAL | Status: DC
Start: 2015-04-30 — End: 2015-05-02
  Administered 2015-05-01: 10 mg via ORAL
  Filled 2015-04-30: qty 1

## 2015-04-30 MED ORDER — ASPIRIN 81 MG PO CHEW
81.0000 mg | CHEWABLE_TABLET | ORAL | Status: AC
  Administered 2015-05-01: 81 mg via ORAL
  Filled 2015-04-30: qty 1

## 2015-04-30 MED ORDER — SODIUM CHLORIDE 0.9 % IV SOLN
INTRAVENOUS | Status: DC
  Administered 2015-04-30: 18:00:00 via INTRAVENOUS
  Administered 2015-05-01: 1000 mL via INTRAVENOUS

## 2015-04-30 MED ORDER — ASPIRIN EC 81 MG PO TBEC
81.0000 mg | DELAYED_RELEASE_TABLET | Freq: Every day | ORAL | Status: DC
  Administered 2015-05-01 – 2015-05-02 (×2): 81 mg via ORAL
  Filled 2015-04-30 (×3): qty 1

## 2015-04-30 MED ORDER — INSULIN ASPART 100 UNIT/ML ~~LOC~~ SOLN
5.0000 [IU] | Freq: Two times a day (BID) | SUBCUTANEOUS | Status: DC
  Administered 2015-05-02: 5 [IU] via SUBCUTANEOUS

## 2015-04-30 MED ORDER — SODIUM BICARBONATE 8.4 % IV SOLN: INTRAVENOUS | Status: DC

## 2015-04-30 MED ORDER — SODIUM BICARBONATE BOLUS VIA INFUSION
INTRAVENOUS | Status: AC
  Administered 2015-05-01: 150 meq via INTRAVENOUS
  Filled 2015-04-30: qty 1

## 2015-04-30 MED ORDER — CARVEDILOL 6.25 MG PO TABS
6.2500 mg | ORAL_TABLET | Freq: Two times a day (BID) | ORAL | Status: DC
  Administered 2015-05-01 – 2015-05-02 (×3): 6.25 mg via ORAL
  Filled 2015-04-30 (×3): qty 1

## 2015-04-30 MED ORDER — SODIUM CHLORIDE 0.9 % WEIGHT BASED INFUSION: 3.0000 mL/kg/h | INTRAVENOUS | Status: DC

## 2015-04-30 MED ORDER — ADULT MULTIVITAMIN W/MINERALS CH
1.0000 | ORAL_TABLET | Freq: Every day | ORAL | Status: DC
  Administered 2015-05-01 – 2015-05-02 (×2): 1 via ORAL
  Filled 2015-04-30 (×2): qty 1

## 2015-04-30 MED ORDER — INSULIN NPH (HUMAN) (ISOPHANE) 100 UNIT/ML ~~LOC~~ SUSP
26.0000 [IU] | Freq: Every day | SUBCUTANEOUS | Status: DC
  Filled 2015-04-30: qty 10

## 2015-04-30 MED ORDER — DEXTROSE 5 % IV SOLN: INTRAVENOUS | Status: DC

## 2015-04-30 MED ORDER — SODIUM BICARBONATE 8.4 % IV SOLN
INTRAVENOUS | Status: DC
  Administered 2015-05-01: 12:00:00 via INTRAVENOUS
  Filled 2015-04-30 (×2): qty 500

## 2015-04-30 MED ORDER — IRBESARTAN 150 MG PO TABS: 300.0000 mg | ORAL_TABLET | Freq: Once | ORAL | Status: DC

## 2015-04-30 MED ORDER — VITAMIN D 1000 UNITS PO TABS
2000.0000 [IU] | ORAL_TABLET | Freq: Every day | ORAL | Status: DC
  Administered 2015-05-01 – 2015-05-02 (×2): 2000 [IU] via ORAL
  Filled 2015-04-30 (×2): qty 2

## 2015-04-30 MED ORDER — BRIMONIDINE TARTRATE 0.2 % OP SOLN
1.0000 [drp] | Freq: Two times a day (BID) | OPHTHALMIC | Status: DC
  Administered 2015-04-30 – 2015-05-02 (×4): 1 [drp] via OPHTHALMIC
  Filled 2015-04-30: qty 5

## 2015-04-30 MED ORDER — ONDANSETRON HCL 4 MG/2ML IJ SOLN: 4.0000 mg | Freq: Four times a day (QID) | INTRAMUSCULAR | Status: DC | PRN

## 2015-04-30 MED ORDER — SODIUM CHLORIDE 0.9 % IJ SOLN: 3.0000 mL | Freq: Two times a day (BID) | INTRAMUSCULAR | Status: DC

## 2015-04-30 MED ORDER — INSULIN NPH (HUMAN) (ISOPHANE) 100 UNIT/ML ~~LOC~~ SUSP
20.0000 [IU] | Freq: Every day | SUBCUTANEOUS | Status: DC
  Administered 2015-05-02: 20 [IU] via SUBCUTANEOUS

## 2015-04-30 MED ORDER — HYDROCHLOROTHIAZIDE 12.5 MG PO CAPS: 12.5000 mg | ORAL_CAPSULE | Freq: Once | ORAL | Status: DC

## 2015-04-30 MED ORDER — SODIUM CHLORIDE 0.9 % WEIGHT BASED INFUSION
1.0000 mL/kg/h | INTRAVENOUS | Status: DC
Start: 2015-05-01 — End: 2015-04-30

## 2015-04-30 MED ORDER — DIPHENHYDRAMINE-APAP (SLEEP) 25-500 MG PO TABS: 2.0000 | ORAL_TABLET | Freq: Every day | ORAL | Status: DC

## 2015-04-30 MED ORDER — SODIUM CHLORIDE 0.9 % IJ SOLN: 3.0000 mL | INTRAMUSCULAR | Status: DC | PRN

## 2015-04-30 NOTE — Progress Notes (Signed)
Pharmacy note: Sodium bicarb for CIN prevention  79 yo male with hx CAD noted with exertional chest tightness associated with worsening dyspnea and plans for cath on 06/01/15 at about 11:30am. Pharmacy has been consulted to dose sodium bicarb for prevention of contrast induced nephropathy  -Wt=114kg -SCr= 2.72 on 04/05/15  Plans -Begin sodium bicarb 1 hr prior to cath at 341ml./hr for 1 hrs decrease to 188ml/hr until 6 hours post procedure -BMET today and daily x2 days   Hildred Laser, Pharm D 04/30/2015 12:56 PM

## 2015-05-01 ENCOUNTER — Encounter (HOSPITAL_COMMUNITY)
Admission: RE | Disposition: A | Discharge status: Home / Self Care | Payer: Self-pay | Source: Ambulatory Visit | Attending: Cardiology

## 2015-05-01 ENCOUNTER — Other Ambulatory Visit: Payer: Self-pay

## 2015-05-01 DIAGNOSIS — E1159 Type 2 diabetes mellitus with other circulatory complications: Secondary | ICD-10-CM | POA: Diagnosis not present

## 2015-05-01 DIAGNOSIS — I5032 Chronic diastolic (congestive) heart failure: Secondary | ICD-10-CM | POA: Diagnosis not present

## 2015-05-01 DIAGNOSIS — E1151 Type 2 diabetes mellitus with diabetic peripheral angiopathy without gangrene: Secondary | ICD-10-CM | POA: Diagnosis not present

## 2015-05-01 DIAGNOSIS — I10 Essential (primary) hypertension: Secondary | ICD-10-CM | POA: Diagnosis not present

## 2015-05-01 DIAGNOSIS — E1122 Type 2 diabetes mellitus with diabetic chronic kidney disease: Secondary | ICD-10-CM | POA: Diagnosis not present

## 2015-05-01 DIAGNOSIS — I25119 Atherosclerotic heart disease of native coronary artery with unspecified angina pectoris: Secondary | ICD-10-CM | POA: Diagnosis not present

## 2015-05-01 DIAGNOSIS — I25118 Atherosclerotic heart disease of native coronary artery with other forms of angina pectoris: Secondary | ICD-10-CM | POA: Diagnosis not present

## 2015-05-01 DIAGNOSIS — I130 Hypertensive heart and chronic kidney disease with heart failure and stage 1 through stage 4 chronic kidney disease, or unspecified chronic kidney disease: Secondary | ICD-10-CM | POA: Diagnosis not present

## 2015-05-01 HISTORY — PX: CARDIAC CATHETERIZATION: SHX172

## 2015-05-01 LAB — BASIC METABOLIC PANEL
ANION GAP: 5 (ref 5–15)
BUN: 40 mg/dL — AB (ref 6–20)
CALCIUM: 8.7 mg/dL — AB (ref 8.9–10.3)
CO2: 22 mmol/L (ref 22–32)
Chloride: 111 mmol/L (ref 101–111)
Creatinine, Ser: 2.2 mg/dL — ABNORMAL HIGH (ref 0.61–1.24)
GFR calc Af Amer: 31 mL/min — ABNORMAL LOW (ref >60)
GFR, EST NON AFRICAN AMERICAN: 27 mL/min — AB (ref >60)
Glucose, Bld: 108 mg/dL — ABNORMAL HIGH (ref 65–99)
Potassium: 4.8 mmol/L (ref 3.5–5.1)
Sodium: 138 mmol/L (ref 135–145)

## 2015-05-01 LAB — CBC
HEMATOCRIT: 28.3 % — AB (ref 39.0–52.0)
Hemoglobin: 9.1 g/dL — ABNORMAL LOW (ref 13.0–17.0)
MCH: 28.9 pg (ref 26.0–34.0)
MCHC: 32.2 g/dL (ref 30.0–36.0)
MCV: 89.8 fL (ref 78.0–100.0)
Platelets: 204 10*3/uL (ref 150–400)
RBC: 3.15 MIL/uL — AB (ref 4.22–5.81)
RDW: 13.7 % (ref 11.5–15.5)
WBC: 12.3 10*3/uL — AB (ref 4.0–10.5)

## 2015-05-01 LAB — GLUCOSE, CAPILLARY
Glucose-Capillary: 119 mg/dL — ABNORMAL HIGH (ref 65–99)
Glucose-Capillary: 168 mg/dL — ABNORMAL HIGH (ref 65–99)
Glucose-Capillary: 215 mg/dL — ABNORMAL HIGH (ref 65–99)
Glucose-Capillary: 189 mg/dL — ABNORMAL HIGH (ref 65–99)

## 2015-05-01 LAB — POCT ACTIVATED CLOTTING TIME
ACTIVATED CLOTTING TIME: 221 s
Activated Clotting Time: 196 s
Activated Clotting Time: 202 s

## 2015-05-01 LAB — PROTIME-INR
INR: 1.05 (ref 0.00–1.49)
Prothrombin Time: 13.9 seconds (ref 11.6–15.2)

## 2015-05-01 SURGERY — LEFT HEART CATH AND CORONARY ANGIOGRAPHY
Anesthesia: LOCAL

## 2015-05-01 MED ORDER — LABETALOL HCL 5 MG/ML IV SOLN
15.0000 mg | INTRAVENOUS | Status: DC | PRN
Start: 2015-05-01 — End: 2015-05-02

## 2015-05-01 MED ORDER — HEPARIN SODIUM (PORCINE) 1000 UNIT/ML IJ SOLN
INTRAMUSCULAR | Status: AC
  Filled 2015-05-01: qty 1

## 2015-05-01 MED ORDER — SODIUM CHLORIDE 0.9 % IV SOLN: 250.0000 mL | INTRAVENOUS | Status: DC | PRN

## 2015-05-01 MED ORDER — HYDRALAZINE HCL 20 MG/ML IJ SOLN
10.0000 mg | INTRAMUSCULAR | Status: DC | PRN
  Administered 2015-05-01 (×2): 10 mg via INTRAVENOUS
  Filled 2015-05-01 (×2): qty 1

## 2015-05-01 MED ORDER — ACETAMINOPHEN 325 MG PO TABS: 650.0000 mg | ORAL_TABLET | ORAL | Status: DC | PRN

## 2015-05-01 MED ORDER — SODIUM CHLORIDE 0.9 % IV SOLN
INTRAVENOUS | Status: AC
  Administered 2015-05-01: 1000 mL via INTRAVENOUS

## 2015-05-01 MED ORDER — VERAPAMIL HCL 2.5 MG/ML IV SOLN
INTRAVENOUS | Status: AC
  Filled 2015-05-01: qty 2

## 2015-05-01 MED ORDER — IOHEXOL 350 MG/ML SOLN
INTRAVENOUS | Status: DC | PRN
  Administered 2015-05-01: 70 mL via INTRAVENOUS

## 2015-05-01 MED ORDER — VERAPAMIL HCL 2.5 MG/ML IV SOLN
INTRA_ARTERIAL | Status: DC | PRN
  Administered 2015-05-01: 15 mL via INTRA_ARTERIAL

## 2015-05-01 MED ORDER — FUROSEMIDE 10 MG/ML IJ SOLN
INTRAMUSCULAR | Status: AC
  Administered 2015-05-01: 21:00:00
  Filled 2015-05-01: qty 4

## 2015-05-01 MED ORDER — HEPARIN SODIUM (PORCINE) 1000 UNIT/ML IJ SOLN
INTRAMUSCULAR | Status: DC | PRN
  Administered 2015-05-01: 6000 [IU] via INTRAVENOUS
  Administered 2015-05-01: 3000 [IU] via INTRAVENOUS
  Administered 2015-05-01: 2000 [IU] via INTRAVENOUS

## 2015-05-01 MED ORDER — MIDAZOLAM HCL 2 MG/2ML IJ SOLN
INTRAMUSCULAR | Status: AC
  Filled 2015-05-01: qty 4

## 2015-05-01 MED ORDER — SODIUM CHLORIDE 0.9 % IJ SOLN: 3.0000 mL | INTRAMUSCULAR | Status: DC | PRN

## 2015-05-01 MED ORDER — HEPARIN (PORCINE) IN NACL 2-0.9 UNIT/ML-% IJ SOLN
INTRAMUSCULAR | Status: AC
  Filled 2015-05-01: qty 1500

## 2015-05-01 MED ORDER — NITROGLYCERIN 1 MG/10 ML FOR IR/CATH LAB
INTRA_ARTERIAL | Status: AC
Start: 2015-05-01 — End: 2015-05-01
  Filled 2015-05-01: qty 10

## 2015-05-01 MED ORDER — SODIUM CHLORIDE 0.9 % IJ SOLN: 3.0000 mL | Freq: Two times a day (BID) | INTRAMUSCULAR | Status: DC

## 2015-05-01 MED ORDER — MIDAZOLAM HCL 2 MG/2ML IJ SOLN
INTRAMUSCULAR | Status: DC | PRN
  Administered 2015-05-01: 2 mg via INTRAVENOUS

## 2015-05-01 MED ORDER — FENTANYL CITRATE (PF) 100 MCG/2ML IJ SOLN
INTRAMUSCULAR | Status: DC | PRN
  Administered 2015-05-01: 25 ug via INTRAVENOUS

## 2015-05-01 MED ORDER — FUROSEMIDE 10 MG/ML IJ SOLN: 40.0000 mg | Freq: Once | INTRAMUSCULAR | Status: AC

## 2015-05-01 MED ORDER — LIDOCAINE HCL (PF) 1 % IJ SOLN
INTRAMUSCULAR | Status: DC | PRN
  Administered 2015-05-01: 15:00:00

## 2015-05-01 MED ORDER — FENTANYL CITRATE (PF) 100 MCG/2ML IJ SOLN
INTRAMUSCULAR | Status: AC
  Filled 2015-05-01: qty 4

## 2015-05-01 MED ORDER — LIDOCAINE HCL (PF) 1 % IJ SOLN
INTRAMUSCULAR | Status: AC
  Filled 2015-05-01: qty 30

## 2015-05-01 MED ORDER — ADENOSINE 12 MG/4ML IV SOLN
16.0000 mL | Freq: Once | INTRAVENOUS | Status: DC
  Filled 2015-05-01: qty 16

## 2015-05-01 MED ORDER — CLOPIDOGREL BISULFATE 75 MG PO TABS
75.0000 mg | ORAL_TABLET | Freq: Every day | ORAL | Status: DC
  Administered 2015-05-01 – 2015-05-02 (×2): 75 mg via ORAL
  Filled 2015-05-01 (×2): qty 1

## 2015-05-01 MED ORDER — SODIUM CHLORIDE 0.9 % IV SOLN
INTRAVENOUS | Status: DC | PRN
  Administered 2015-05-01: 10 mL/h via INTRAVENOUS

## 2015-05-01 SURGICAL SUPPLY — 13 items
CATH MICROCATH NAVVUS (MICROCATHETER) IMPLANT
CATH OPTITORQUE TIG 4.0 5F (CATHETERS) ×3 IMPLANT
CATH VISTA GUIDE 6FR XB3.5 (CATHETERS) ×1 IMPLANT
DEVICE RAD COMP TR BAND LRG (VASCULAR PRODUCTS) ×3 IMPLANT
GLIDESHEATH SLEND A-KIT 6F 20G (SHEATH) ×3 IMPLANT
KIT ESSENTIALS PG (KITS) ×1 IMPLANT
KIT HEART LEFT (KITS) ×3 IMPLANT
MICROCATHETER NAVVUS (MICROCATHETER) ×3
PACK CARDIAC CATHETERIZATION (CUSTOM PROCEDURE TRAY) ×3 IMPLANT
TRANSDUCER W/STOPCOCK (MISCELLANEOUS) ×3 IMPLANT
TUBING CIL FLEX 10 FLL-RA (TUBING) ×3 IMPLANT
WIRE COUGAR XT STRL 190CM (WIRE) ×1 IMPLANT
WIRE SAFE-T 1.5MM-J .035X260CM (WIRE) ×3 IMPLANT

## 2015-05-01 NOTE — Progress Notes (Signed)
Pt called nurse and stated he was bleeding from TR band site.  Upon assessment, pt was bleeding.  3 cc air injected back into TR band and bleeding stopped.  Will continue to closely monitor.

## 2015-05-01 NOTE — Progress Notes (Signed)
Per report pt has been SOB since returning from Cath Lab.  Pt has become progressively SOB/unable to tolerate lying in bed/some relief sitting on side of bed, however his work of breathing is still increased and labored respirations.  Lungs are decreased throughout but more so in the bases, mild distention of neck veins as well. Pt states he feels full in his abdomen.  BP also elevated.  O2 sats 97 on RA but previously applied 02 at 2L/[place] for comfort.  Dr Einar Gip paged and informed and orders received for IV Lasix 40 mg X1 dose.  Which has been given by the time of this note. Will continue to monitor. Jessie Foot, RN

## 2015-05-01 NOTE — Interval H&P Note (Signed)
History and Physical Interval Note:  05/01/2015 1:12 PM  Ray Merritt  has presented today for surgery, with the diagnosis of cp, cad, heart failure  The various methods of treatment have been discussed with the patient and family. After consideration of risks, benefits and other options for treatment, the patient has consented to  Procedure(s): Left Heart Cath and Coronary Angiography (N/A) and possible PCI  as a surgical intervention .  The patient's history has been reviewed, patient examined, no change in status, stable for surgery.  I have reviewed the patient's chart and labs.  Questions were answered to the patient's satisfaction.   Ischemic Symptoms? CCS III (Marked limitation of ordinary activity) Anti-ischemic Medical Therapy? Minimal Therapy (1 class of medications) Non-invasive Test Results? High-risk stress test findings: cardiac mortality >3%/yr Prior CABG? No Previous CABG   Patient Information:   1-2V CAD, no prox LAD  A (8)  Indication: 18; Score: 8   Patient Information:   CTO of 1 vessel, no other CAD  A (7)  Indication: 28; Score: 7   Patient Information:   1V CAD with prox LAD  A (9)  Indication: 34; Score: 9   Patient Information:   2V-CAD with prox LAD  A (9)  Indication: 40; Score: 9   Patient Information:   3V-CAD without LMCA  A (9)  Indication: 46; Score: 9   Patient Information:   3V-CAD without LMCA With Abnormal LV systolic function  A (9)  Indication: 48; Score: 9   Patient Information:   LMCA-CAD  A (9)  Indication: 49; Score: 9   Patient Information:   2V-CAD with prox LAD PCI  A (7)  Indication: 62; Score: 7   Patient Information:   2V-CAD with prox LAD CABG  A (8)  Indication: 62; Score: 8   Patient Information:   3V-CAD without LMCA With Low CAD burden(i.e., 3 focal stenoses, low SYNTAX score) PCI  A (7)  Indication: 63; Score: 7   Patient Information:   3V-CAD without LMCA With Low CAD  burden(i.e., 3 focal stenoses, low SYNTAX score) CABG  A (9)  Indication: 63; Score: 9   Patient Information:   3V-CAD without LMCA E06c - Intermediate-high CAD burden (i.e., multiple diffuse lesions, presence of CTO, or high SYNTAX score) PCI  U (4)  Indication: 64; Score: 4   Patient Information:   3V-CAD without LMCA E06c - Intermediate-high CAD burden (i.e., multiple diffuse lesions, presence of CTO, or high SYNTAX score) CABG  A (9)  Indication: 64; Score: 9   Patient Information:   LMCA-CAD With Isolated LMCA stenosis  PCI  U (6)  Indication: 65; Score: 6   Patient Information:   LMCA-CAD With Isolated LMCA stenosis  CABG  A (9)  Indication: 65; Score: 9   Patient Information:   LMCA-CAD Additional CAD, low CAD burden (i.e., 1- to 2-vessel additional involvement, low SYNTAX score) PCI  U (5)  Indication: 66; Score: 5   Patient Information:   LMCA-CAD Additional CAD, low CAD burden (i.e., 1- to 2-vessel additional involvement, low SYNTAX score) CABG  A (9)  Indication: 66; Score: 9   Patient Information:   LMCA-CAD Additional CAD, intermediate-high CAD burden (i.e., 3-vessel involvement, presence of CTO, or high SYNTAX score) PCI  I (3)  Indication: 67; Score: 3   Patient Information:   LMCA-CAD Additional CAD, intermediate-high CAD burden (i.e., 3-vessel involvement, presence of CTO, or high SYNTAX score) CABG  A (9)  Indication: 67; Score: 9  Adrian Prows

## 2015-05-01 NOTE — Progress Notes (Signed)
Pt returned to floor from cath lab.  Upon assessment pt BP 196/72 manual, HR 85, and pt having expiratory wheezing.  Dr. Einar Gip notified and orders received to discontinue sodium bicarb infusion and to start post cath NS infusion.  Also, orders received to order PRN hydralazine and labetolol.  IV hydralazine given, will continue to closely monitor.

## 2015-05-02 ENCOUNTER — Encounter (HOSPITAL_COMMUNITY): Payer: Self-pay | Admitting: Cardiology

## 2015-05-02 DIAGNOSIS — I5032 Chronic diastolic (congestive) heart failure: Secondary | ICD-10-CM | POA: Diagnosis not present

## 2015-05-02 DIAGNOSIS — I25118 Atherosclerotic heart disease of native coronary artery with other forms of angina pectoris: Secondary | ICD-10-CM | POA: Diagnosis not present

## 2015-05-02 DIAGNOSIS — E1151 Type 2 diabetes mellitus with diabetic peripheral angiopathy without gangrene: Secondary | ICD-10-CM | POA: Diagnosis not present

## 2015-05-02 DIAGNOSIS — E1159 Type 2 diabetes mellitus with other circulatory complications: Secondary | ICD-10-CM | POA: Diagnosis not present

## 2015-05-02 DIAGNOSIS — I130 Hypertensive heart and chronic kidney disease with heart failure and stage 1 through stage 4 chronic kidney disease, or unspecified chronic kidney disease: Secondary | ICD-10-CM | POA: Diagnosis not present

## 2015-05-02 DIAGNOSIS — I25119 Atherosclerotic heart disease of native coronary artery with unspecified angina pectoris: Secondary | ICD-10-CM | POA: Diagnosis not present

## 2015-05-02 DIAGNOSIS — I10 Essential (primary) hypertension: Secondary | ICD-10-CM | POA: Diagnosis not present

## 2015-05-02 DIAGNOSIS — E1122 Type 2 diabetes mellitus with diabetic chronic kidney disease: Secondary | ICD-10-CM | POA: Diagnosis not present

## 2015-05-02 LAB — GLUCOSE, CAPILLARY: Glucose-Capillary: 186 mg/dL — ABNORMAL HIGH (ref 65–99)

## 2015-05-02 LAB — BASIC METABOLIC PANEL
Anion gap: 10 (ref 5–15)
BUN: 39 mg/dL — AB (ref 6–20)
CALCIUM: 8.6 mg/dL — AB (ref 8.9–10.3)
CHLORIDE: 104 mmol/L (ref 101–111)
CO2: 23 mmol/L (ref 22–32)
CREATININE: 2.16 mg/dL — AB (ref 0.61–1.24)
GFR calc non Af Amer: 28 mL/min — ABNORMAL LOW (ref >60)
GFR, EST AFRICAN AMERICAN: 32 mL/min — AB (ref >60)
GLUCOSE: 211 mg/dL — AB (ref 65–99)
Potassium: 4.5 mmol/L (ref 3.5–5.1)
Sodium: 137 mmol/L (ref 135–145)

## 2015-05-02 MED ORDER — CLOPIDOGREL BISULFATE 75 MG PO TABS: 75.0000 mg | ORAL_TABLET | Freq: Every day | ORAL | Status: DC

## 2015-05-02 NOTE — Discharge Summary (Signed)
Physician Discharge Summary  Patient ID: Ray Merritt MRN: [id number] DOB/AGE: 03/04/1936 79 y.o.  Admit date: 04/30/2015 Discharge date: 05/02/2015  Primary Discharge Diagnosis: CAD of native artery with angina  Secondary Discharge Diagnosis: 1. Benign essential hypertension 2. Hyperlipidemia 3. Uncontrolled Type II Diabetes Mellitus 4. PAD 5. CKD Stage 3-4  Significant Diagnostic Studies: Coronary angiogram 05/01/2015: Left main shows distal 20% stenosis, mild calcification.  Circumflex coronary artery large dominant, ostial calcified 30-40% stenosis, some views appear to be much more significant. Mild diffuse disease distally. No high-grade focal lesions evident.  FFR to the circumflex coronary artery: Insignificant stenosis. FFR remained steady at 0.90 at 90 seconds of adenosine infusion.  LAD: Mildly calcified. Midsegment shows a 60-70% stenosis, some views appear to be 80%. Large diagonal 1.  FFR to the LAD: Patient has hemodynamically significant stenosis in the mid LAD. FFR immediately dropped to 0.72 after initiation of adenosine.  Ramus intermediate: Small to moderate vessel, mild calcification evident, mild diffuse disease.  Left ventricle hemodynamics: 166/12 and ostial pressure 33 mmHg, aortic pressure 159/66, mean of 99 mmHg. No pressure gradient across the aortic valve.  Hospital Course: Patient presented for outpatient follow-up after recent stress testing for symptoms atypical for angina pectoris and worsening dyspnea on exertion. Patient had high risk stress test, cannot exclude balanced ischemia, and, in spite of aggressive medical therapy continued to have exertional chest pain and worsening dyspnea.  He was scheduled for coronary angiography and was admitted one day prior for hydration valsartan/HCT was held. He underwent coronary angiography which revealed mid-LAD 60-70% stenosis, some views appear to be 80%. Due to renal function, PCI will be scheduled for a  later date.    Post-cath, he developed SOB with JVD, likely due to pre-hydration.  One dose of Lasix was given with resolution of symptoms. He denies any SOB this morning, good UOP, lung sounds improved, no JVD or H-J reflux.  Recommendations on discharge: Continue to hold Valsartan at this time, PCI will be scheduled on an elective basis.  eGFR and creatinine stable, follow up next week for reevaluation of renal function.  Discharge Exam: Blood pressure 162/42, pulse 57, temperature 97.8 F (36.6 C), temperature source Oral, resp. rate 19, weight 111.494 kg (245 lb 12.8 oz), SpO2 99 %.    General appearance: alert, cooperative, appears stated age and no distress Neck: no adenopathy, no carotid bruit, no JVD, supple, symmetrical, trachea midline and thyroid not enlarged, symmetric, no tenderness/mass/nodules Resp: wheezes bilaterally Cardio: regular rate and rhythm, S1, S2 normal, no murmur, click, rub or gallop GI: soft, non-tender; bowel sounds normal; no masses,  no organomegaly Extremities: edema trace-1+ pitting edema Pulses: 2+ and symmetric right radial access site asymptomatic Skin: Skin color, texture, turgor normal. No rashes or lesions  Labs:   Lab Results  Component Value Date   WBC 12.3* 05/01/2015   HGB 9.1* 05/01/2015   HCT 28.3* 05/01/2015   MCV 89.8 05/01/2015   PLT 204 05/01/2015    Recent Labs Lab 05/02/15 0506  NA 137  K 4.5  CL 104  CO2 23  BUN 39*  CREATININE 2.16*  CALCIUM 8.6*  GLUCOSE 211*    Lipid Panel     Component Value Date/Time   TRIG 217* 08/21/2014   HDL 37 08/21/2014   LDLCALC 87 08/21/2014    BNP (last 3 results) No results for input(s): BNP in the last 8760 hours.  ProBNP (last 3 results) No results for input(s): PROBNP in the last 8760  hours.  HEMOGLOBIN A1C Lab Results  Component Value Date   HGBA1C 8.0* 04/05/2015    Cardiac Panel (last 3 results) No results for input(s): CKTOTAL, CKMB, TROPONINI, RELINDX in the  last 8760 hours.  No results found for: CKTOTAL, CKMB, CKMBINDEX, TROPONINI   TSH  Recent Labs  02/27/15 1004  TSH 2.06   Radiology: No results found.   FOLLOW UP PLANS AND APPOINTMENTS    Medication List    TAKE these medications        aspirin 81 MG tablet  Take 81 mg by mouth daily.     atorvastatin 10 MG tablet  Commonly known as:  LIPITOR  Take 10 mg by mouth daily.     brimonidine 0.15 % ophthalmic solution  Commonly known as:  ALPHAGAN  Place 1 drop into both eyes 2 (two) times daily.     carvedilol 6.25 MG tablet  Commonly known as:  COREG  Take 6.25 mg by mouth 2 (two) times daily with a meal.     cholecalciferol 1000 UNITS tablet  Commonly known as:  VITAMIN D  Take 2,000 Units by mouth daily.     clopidogrel 75 MG tablet  Commonly known as:  PLAVIX  Take 1 tablet (75 mg total) by mouth daily with breakfast.     diphenhydramine-acetaminophen 25-500 MG Tabs tablet  Commonly known as:  TYLENOL PM  Take 2 tablets by mouth at bedtime.     glucose blood test strip  Commonly known as:  BAYER CONTOUR NEXT TEST  Use as instructed to check blood sugar 2 times per day dx code E11.65     insulin NPH Human 100 UNIT/ML injection  Commonly known as:  HUMULIN N,NOVOLIN N  Inject 20-26 Units into the skin 2 (two) times daily. Inject 20 units subque in the morning and 26 units subque in the evening before bed     multivitamin with minerals Tabs tablet  Take 1 tablet by mouth daily.     NOVOLIN R 100 units/mL injection  Generic drug:  insulin regular  INJECT 10 UNITS EVERY MORNING BEFORE BREAKFAST, 5 UNITS BEFORE LUNCH, AND 10 UNITS BEFORE SUPPER     Travoprost (BAK Free) 0.004 % Soln ophthalmic solution  Commonly known as:  TRAVATAN  Place 1 drop into both eyes at bedtime.     triamcinolone cream 0.1 %  Commonly known as:  KENALOG  Apply 1 application topically daily.     vitamin C 500 MG tablet  Commonly known as:  ASCORBIC ACID  Take 1,000 mg by  mouth daily.           Follow-up Information    Follow up with Adrian Prows, MD On 05/10/2015.   Specialty:  Cardiology   Why:  at 9:00am   Contact information:   [address] [place] [address] [phone]        Rachel Bo, NP-C 05/02/2015, 8:29 AM [facility] Pager: [phone] Office: [phone]

## 2015-05-10 ENCOUNTER — Telehealth: Payer: Self-pay | Admitting: Endocrinology

## 2015-05-10 NOTE — Telephone Encounter (Signed)
Ray Merritt from [place]  Scheduling Dept need a order for a ultra sound guided biopsy, need Name, DOB, Doctor signature, phone # [phone] option 1

## 2015-05-11 DIAGNOSIS — E041 Nontoxic single thyroid nodule: Secondary | ICD-10-CM | POA: Diagnosis not present

## 2015-05-11 NOTE — Telephone Encounter (Signed)
Order was sent

## 2015-05-16 DIAGNOSIS — R079 Chest pain, unspecified: Secondary | ICD-10-CM | POA: Diagnosis not present

## 2015-05-18 ENCOUNTER — Other Ambulatory Visit: Payer: Medicare Other

## 2015-05-21 ENCOUNTER — Observation Stay (HOSPITAL_COMMUNITY)
Admission: RE | Admit: 2015-05-21 | Discharge: 2015-05-24 | Disposition: A | Discharge status: Home / Self Care | Payer: Medicare Other | Source: Ambulatory Visit | Attending: Cardiology | Admitting: Cardiology

## 2015-05-21 ENCOUNTER — Encounter (HOSPITAL_COMMUNITY): Payer: Self-pay | Admitting: General Practice

## 2015-05-21 DIAGNOSIS — Z794 Long term (current) use of insulin: Secondary | ICD-10-CM | POA: Insufficient documentation

## 2015-05-21 DIAGNOSIS — D631 Anemia in chronic kidney disease: Secondary | ICD-10-CM | POA: Insufficient documentation

## 2015-05-21 DIAGNOSIS — I70219 Atherosclerosis of native arteries of extremities with intermittent claudication, unspecified extremity: Secondary | ICD-10-CM | POA: Diagnosis not present

## 2015-05-21 DIAGNOSIS — R079 Chest pain, unspecified: Secondary | ICD-10-CM | POA: Diagnosis not present

## 2015-05-21 DIAGNOSIS — Z87891 Personal history of nicotine dependence: Secondary | ICD-10-CM | POA: Diagnosis not present

## 2015-05-21 DIAGNOSIS — I25118 Atherosclerotic heart disease of native coronary artery with other forms of angina pectoris: Secondary | ICD-10-CM | POA: Diagnosis not present

## 2015-05-21 DIAGNOSIS — I2584 Coronary atherosclerosis due to calcified coronary lesion: Secondary | ICD-10-CM | POA: Diagnosis not present

## 2015-05-21 DIAGNOSIS — E1165 Type 2 diabetes mellitus with hyperglycemia: Secondary | ICD-10-CM | POA: Insufficient documentation

## 2015-05-21 DIAGNOSIS — I97190 Other postprocedural cardiac functional disturbances following cardiac surgery: Secondary | ICD-10-CM | POA: Insufficient documentation

## 2015-05-21 DIAGNOSIS — E1159 Type 2 diabetes mellitus with other circulatory complications: Secondary | ICD-10-CM | POA: Diagnosis present

## 2015-05-21 DIAGNOSIS — E785 Hyperlipidemia, unspecified: Secondary | ICD-10-CM | POA: Diagnosis not present

## 2015-05-21 DIAGNOSIS — Z955 Presence of coronary angioplasty implant and graft: Secondary | ICD-10-CM

## 2015-05-21 DIAGNOSIS — N184 Chronic kidney disease, stage 4 (severe): Secondary | ICD-10-CM | POA: Diagnosis not present

## 2015-05-21 DIAGNOSIS — I214 Non-ST elevation (NSTEMI) myocardial infarction: Secondary | ICD-10-CM | POA: Diagnosis not present

## 2015-05-21 DIAGNOSIS — I129 Hypertensive chronic kidney disease with stage 1 through stage 4 chronic kidney disease, or unspecified chronic kidney disease: Secondary | ICD-10-CM | POA: Insufficient documentation

## 2015-05-21 DIAGNOSIS — I25119 Atherosclerotic heart disease of native coronary artery with unspecified angina pectoris: Secondary | ICD-10-CM | POA: Diagnosis not present

## 2015-05-21 DIAGNOSIS — I2510 Atherosclerotic heart disease of native coronary artery without angina pectoris: Secondary | ICD-10-CM | POA: Diagnosis present

## 2015-05-21 DIAGNOSIS — I209 Angina pectoris, unspecified: Secondary | ICD-10-CM

## 2015-05-21 HISTORY — DX: Unspecified osteoarthritis, unspecified site: M19.90

## 2015-05-21 HISTORY — DX: Atherosclerotic heart disease of native coronary artery without angina pectoris: I25.10

## 2015-05-21 LAB — CBC WITH DIFFERENTIAL/PLATELET
BASOS ABS: 0 10*3/uL (ref 0.0–0.1)
BASOS PCT: 0 %
EOS PCT: 3 %
Eosinophils Absolute: 0.4 10*3/uL (ref 0.0–0.7)
HCT: 29.5 % — ABNORMAL LOW (ref 39.0–52.0)
Hemoglobin: 9.7 g/dL — ABNORMAL LOW (ref 13.0–17.0)
Lymphocytes Relative: 51 %
Lymphs Abs: 5.7 10*3/uL — ABNORMAL HIGH (ref 0.7–4.0)
MCH: 29.6 pg (ref 26.0–34.0)
MCHC: 32.9 g/dL (ref 30.0–36.0)
MCV: 89.9 fL (ref 78.0–100.0)
MONO ABS: 0.6 10*3/uL (ref 0.1–1.0)
Monocytes Relative: 5 %
Neutro Abs: 4.6 10*3/uL (ref 1.7–7.7)
Neutrophils Relative %: 41 %
PLATELETS: 228 10*3/uL (ref 150–400)
RBC: 3.28 MIL/uL — AB (ref 4.22–5.81)
RDW: 13.7 % (ref 11.5–15.5)
WBC: 11.3 10*3/uL — AB (ref 4.0–10.5)

## 2015-05-21 LAB — GLUCOSE, CAPILLARY
GLUCOSE-CAPILLARY: 87 mg/dL (ref 65–99)
GLUCOSE-CAPILLARY: 125 mg/dL — AB (ref 65–99)

## 2015-05-21 LAB — PROTIME-INR
INR: 1.07 (ref 0.00–1.49)
PROTHROMBIN TIME: 14.1 s (ref 11.6–15.2)

## 2015-05-21 MED ORDER — CLOPIDOGREL BISULFATE 75 MG PO TABS
600.0000 mg | ORAL_TABLET | Freq: Once | ORAL | Status: AC
  Administered 2015-05-22: 600 mg via ORAL
  Filled 2015-05-21: qty 8

## 2015-05-21 MED ORDER — CLOPIDOGREL BISULFATE 75 MG PO TABS: 600.0000 mg | ORAL_TABLET | Freq: Once | ORAL | Status: DC

## 2015-05-21 MED ORDER — SODIUM CHLORIDE 0.9 % IJ SOLN: 3.0000 mL | Freq: Two times a day (BID) | INTRAMUSCULAR | Status: DC

## 2015-05-21 MED ORDER — SODIUM CHLORIDE 0.9 % IJ SOLN: 3.0000 mL | INTRAMUSCULAR | Status: DC | PRN

## 2015-05-21 MED ORDER — ASPIRIN 81 MG PO CHEW
81.0000 mg | CHEWABLE_TABLET | ORAL | Status: AC
  Administered 2015-05-22: 81 mg via ORAL
  Filled 2015-05-21: qty 1

## 2015-05-21 MED ORDER — ONDANSETRON HCL 4 MG/2ML IJ SOLN
4.0000 mg | Freq: Four times a day (QID) | INTRAMUSCULAR | Status: DC | PRN
  Filled 2015-05-21: qty 2

## 2015-05-21 MED ORDER — SODIUM BICARBONATE BOLUS VIA INFUSION
INTRAVENOUS | Status: AC
  Administered 2015-05-22: 06:00:00 via INTRAVENOUS
  Filled 2015-05-21: qty 1

## 2015-05-21 MED ORDER — INSULIN ASPART 100 UNIT/ML ~~LOC~~ SOLN
0.0000 [IU] | Freq: Three times a day (TID) | SUBCUTANEOUS | Status: DC
  Administered 2015-05-22: 3 [IU] via SUBCUTANEOUS
  Administered 2015-05-22: 2 [IU] via SUBCUTANEOUS
  Administered 2015-05-23: 3 [IU] via SUBCUTANEOUS
  Administered 2015-05-23: 5 [IU] via SUBCUTANEOUS
  Administered 2015-05-23: 3 [IU] via SUBCUTANEOUS
  Administered 2015-05-24: 2 [IU] via SUBCUTANEOUS

## 2015-05-21 MED ORDER — SODIUM CHLORIDE 0.9 % IJ SOLN
3.0000 mL | Freq: Two times a day (BID) | INTRAMUSCULAR | Status: DC
  Administered 2015-05-21: 3 mL via INTRAVENOUS

## 2015-05-21 MED ORDER — SODIUM BICARBONATE 8.4 % IV SOLN
INTRAVENOUS | Status: AC
  Administered 2015-05-22: 08:00:00 via INTRAVENOUS
  Filled 2015-05-21 (×2): qty 1000

## 2015-05-21 MED ORDER — SODIUM BICARBONATE 8.4 % IV SOLN
INTRAVENOUS | Status: AC
  Administered 2015-05-22 (×2): via INTRAVENOUS
  Filled 2015-05-21 (×2): qty 500

## 2015-05-21 MED ORDER — ACETAMINOPHEN 325 MG PO TABS: 650.0000 mg | ORAL_TABLET | ORAL | Status: DC | PRN

## 2015-05-21 MED ORDER — SODIUM CHLORIDE 0.9 % WEIGHT BASED INFUSION
1.0000 mL/kg/h | INTRAVENOUS | Status: DC
  Administered 2015-05-21 – 2015-05-22 (×2): 1 mL/kg/h via INTRAVENOUS

## 2015-05-21 MED ORDER — INSULIN ASPART 100 UNIT/ML ~~LOC~~ SOLN: 0.0000 [IU] | Freq: Every day | SUBCUTANEOUS | Status: DC

## 2015-05-21 MED ORDER — SODIUM CHLORIDE 0.9 % IV SOLN: 250.0000 mL | INTRAVENOUS | Status: DC | PRN

## 2015-05-22 ENCOUNTER — Encounter (HOSPITAL_COMMUNITY)
Admission: RE | Disposition: A | Discharge status: Home / Self Care | Payer: Self-pay | Source: Ambulatory Visit | Attending: Cardiology

## 2015-05-22 ENCOUNTER — Encounter (HOSPITAL_COMMUNITY)
Admission: RE | Disposition: A | Discharge status: Home / Self Care | Payer: Medicare Other | Source: Ambulatory Visit | Attending: Cardiology

## 2015-05-22 DIAGNOSIS — Z87891 Personal history of nicotine dependence: Secondary | ICD-10-CM | POA: Diagnosis not present

## 2015-05-22 DIAGNOSIS — Z794 Long term (current) use of insulin: Secondary | ICD-10-CM | POA: Diagnosis not present

## 2015-05-22 DIAGNOSIS — I25118 Atherosclerotic heart disease of native coronary artery with other forms of angina pectoris: Secondary | ICD-10-CM | POA: Diagnosis not present

## 2015-05-22 DIAGNOSIS — I2584 Coronary atherosclerosis due to calcified coronary lesion: Secondary | ICD-10-CM | POA: Diagnosis not present

## 2015-05-22 DIAGNOSIS — I25119 Atherosclerotic heart disease of native coronary artery with unspecified angina pectoris: Secondary | ICD-10-CM | POA: Diagnosis not present

## 2015-05-22 DIAGNOSIS — E1159 Type 2 diabetes mellitus with other circulatory complications: Secondary | ICD-10-CM | POA: Diagnosis not present

## 2015-05-22 DIAGNOSIS — R079 Chest pain, unspecified: Secondary | ICD-10-CM | POA: Diagnosis not present

## 2015-05-22 DIAGNOSIS — I129 Hypertensive chronic kidney disease with stage 1 through stage 4 chronic kidney disease, or unspecified chronic kidney disease: Secondary | ICD-10-CM | POA: Diagnosis not present

## 2015-05-22 DIAGNOSIS — E785 Hyperlipidemia, unspecified: Secondary | ICD-10-CM | POA: Diagnosis not present

## 2015-05-22 HISTORY — PX: CARDIAC CATHETERIZATION: SHX172

## 2015-05-22 LAB — TROPONIN I: TROPONIN I: 0.43 ng/mL — AB (ref <0.031)

## 2015-05-22 LAB — BASIC METABOLIC PANEL
Anion gap: 7 (ref 5–15)
BUN: 44 mg/dL — AB (ref 6–20)
CHLORIDE: 109 mmol/L (ref 101–111)
CO2: 21 mmol/L — AB (ref 22–32)
Calcium: 8.8 mg/dL — ABNORMAL LOW (ref 8.9–10.3)
Creatinine, Ser: 2.33 mg/dL — ABNORMAL HIGH (ref 0.61–1.24)
GFR calc Af Amer: 29 mL/min — ABNORMAL LOW (ref >60)
GFR calc non Af Amer: 25 mL/min — ABNORMAL LOW (ref >60)
Glucose, Bld: 113 mg/dL — ABNORMAL HIGH (ref 65–99)
POTASSIUM: 4.7 mmol/L (ref 3.5–5.1)
Sodium: 137 mmol/L (ref 135–145)

## 2015-05-22 LAB — GLUCOSE, CAPILLARY
GLUCOSE-CAPILLARY: 123 mg/dL — AB (ref 65–99)
GLUCOSE-CAPILLARY: 197 mg/dL — AB (ref 65–99)
GLUCOSE-CAPILLARY: 153 mg/dL — AB (ref 65–99)
GLUCOSE-CAPILLARY: 166 mg/dL — AB (ref 65–99)
Glucose-Capillary: 133 mg/dL — ABNORMAL HIGH (ref 65–99)

## 2015-05-22 LAB — CK TOTAL AND CKMB (NOT AT ARMC)
CK TOTAL: 417 U/L — AB (ref 49–397)
CK, MB: 14.3 ng/mL — AB (ref 0.5–5.0)
Relative Index: 3.4 — ABNORMAL HIGH (ref 0.0–2.5)

## 2015-05-22 LAB — MRSA PCR SCREENING: MRSA BY PCR: NEGATIVE

## 2015-05-22 LAB — POCT ACTIVATED CLOTTING TIME: ACTIVATED CLOTTING TIME: 417 s

## 2015-05-22 SURGERY — LEFT HEART CATH AND CORONARY ANGIOGRAPHY
Anesthesia: LOCAL

## 2015-05-22 SURGERY — CORONARY STENT INTERVENTION

## 2015-05-22 MED ORDER — BIVALIRUDIN BOLUS VIA INFUSION - CUPID
INTRAVENOUS | Status: DC | PRN
  Administered 2015-05-22: 84.15 mg via INTRAVENOUS

## 2015-05-22 MED ORDER — BRIMONIDINE TARTRATE 0.2 % OP SOLN
1.0000 [drp] | Freq: Two times a day (BID) | OPHTHALMIC | Status: DC
  Administered 2015-05-23 – 2015-05-24 (×2): 1 [drp] via OPHTHALMIC
  Filled 2015-05-22 (×2): qty 5

## 2015-05-22 MED ORDER — SODIUM CHLORIDE 0.9 % IV SOLN: INTRAVENOUS | Status: AC

## 2015-05-22 MED ORDER — ZOLPIDEM TARTRATE 5 MG PO TABS
5.0000 mg | ORAL_TABLET | Freq: Every evening | ORAL | Status: DC | PRN
  Administered 2015-05-23: 5 mg via ORAL
  Filled 2015-05-22: qty 1

## 2015-05-22 MED ORDER — PANTOPRAZOLE SODIUM 40 MG PO TBEC
40.0000 mg | DELAYED_RELEASE_TABLET | Freq: Two times a day (BID) | ORAL | Status: DC
  Administered 2015-05-22 – 2015-05-24 (×4): 40 mg via ORAL
  Filled 2015-05-22 (×4): qty 1

## 2015-05-22 MED ORDER — IOHEXOL 350 MG/ML SOLN
INTRAVENOUS | Status: DC | PRN
  Administered 2015-05-22: 100 mL via INTRA_ARTERIAL

## 2015-05-22 MED ORDER — CARVEDILOL 12.5 MG PO TABS
12.5000 mg | ORAL_TABLET | Freq: Two times a day (BID) | ORAL | Status: DC
  Administered 2015-05-22 – 2015-05-24 (×4): 12.5 mg via ORAL
  Filled 2015-05-22 (×4): qty 1

## 2015-05-22 MED ORDER — NITROGLYCERIN IN D5W 200-5 MCG/ML-% IV SOLN
INTRAVENOUS | Status: AC
  Administered 2015-05-22: 50000 ug
  Filled 2015-05-22: qty 250

## 2015-05-22 MED ORDER — VITAMIN C 500 MG PO TABS
1000.0000 mg | ORAL_TABLET | Freq: Every day | ORAL | Status: DC
  Administered 2015-05-23 – 2015-05-24 (×2): 1000 mg via ORAL
  Filled 2015-05-22 (×2): qty 2

## 2015-05-22 MED ORDER — SODIUM CHLORIDE 0.9 % IJ SOLN
3.0000 mL | Freq: Two times a day (BID) | INTRAMUSCULAR | Status: DC
  Administered 2015-05-23: 3 mL via INTRAVENOUS
  Administered 2015-05-23: 10 mL via INTRAVENOUS
  Administered 2015-05-24: 3 mL via INTRAVENOUS

## 2015-05-22 MED ORDER — VERAPAMIL HCL 2.5 MG/ML IV SOLN
INTRA_ARTERIAL | Status: DC | PRN
  Administered 2015-05-22: 13:00:00 via INTRA_ARTERIAL

## 2015-05-22 MED ORDER — SODIUM CHLORIDE 0.9 % IV SOLN: 250.0000 mL | INTRAVENOUS | Status: DC | PRN

## 2015-05-22 MED ORDER — FENTANYL CITRATE (PF) 100 MCG/2ML IJ SOLN
50.0000 ug | INTRAMUSCULAR | Status: DC | PRN
  Administered 2015-05-22: 50 ug via INTRAVENOUS
  Filled 2015-05-22: qty 2

## 2015-05-22 MED ORDER — HEPARIN SODIUM (PORCINE) 1000 UNIT/ML IJ SOLN
INTRAMUSCULAR | Status: AC
  Filled 2015-05-22: qty 1

## 2015-05-22 MED ORDER — HYDRALAZINE HCL 20 MG/ML IJ SOLN
INTRAMUSCULAR | Status: AC
  Filled 2015-05-22: qty 1

## 2015-05-22 MED ORDER — FENTANYL CITRATE (PF) 100 MCG/2ML IJ SOLN
INTRAMUSCULAR | Status: AC
  Filled 2015-05-22: qty 4

## 2015-05-22 MED ORDER — BIVALIRUDIN 250 MG IV SOLR
INTRAVENOUS | Status: AC
  Filled 2015-05-22: qty 250

## 2015-05-22 MED ORDER — SODIUM CHLORIDE 0.9 % IJ SOLN
3.0000 mL | INTRAMUSCULAR | Status: DC | PRN
Start: 2015-05-22 — End: 2015-05-24

## 2015-05-22 MED ORDER — LIDOCAINE HCL (PF) 1 % IJ SOLN
INTRAMUSCULAR | Status: DC | PRN
Start: 2015-05-22 — End: 2015-05-22
  Administered 2015-05-22: 15 mL

## 2015-05-22 MED ORDER — ISOSORBIDE MONONITRATE ER 60 MG PO TB24
60.0000 mg | ORAL_TABLET | Freq: Every day | ORAL | Status: DC
  Administered 2015-05-22 – 2015-05-24 (×3): 60 mg via ORAL
  Filled 2015-05-22 (×3): qty 1

## 2015-05-22 MED ORDER — INSULIN NPH (HUMAN) (ISOPHANE) 100 UNIT/ML ~~LOC~~ SUSP
20.0000 [IU] | Freq: Every day | SUBCUTANEOUS | Status: DC
  Administered 2015-05-23 – 2015-05-24 (×2): 20 [IU] via SUBCUTANEOUS
  Filled 2015-05-22 (×3): qty 10

## 2015-05-22 MED ORDER — AMLODIPINE BESYLATE 10 MG PO TABS
10.0000 mg | ORAL_TABLET | Freq: Every day | ORAL | Status: DC
  Administered 2015-05-22 – 2015-05-24 (×3): 10 mg via ORAL
  Filled 2015-05-22 (×3): qty 1

## 2015-05-22 MED ORDER — CLOPIDOGREL BISULFATE 75 MG PO TABS
75.0000 mg | ORAL_TABLET | Freq: Every day | ORAL | Status: DC
  Administered 2015-05-23 – 2015-05-24 (×2): 75 mg via ORAL
  Filled 2015-05-22 (×2): qty 1

## 2015-05-22 MED ORDER — ASPIRIN EC 81 MG PO TBEC
81.0000 mg | DELAYED_RELEASE_TABLET | Freq: Every day | ORAL | Status: DC
  Administered 2015-05-23 – 2015-05-24 (×2): 81 mg via ORAL
  Filled 2015-05-22 (×2): qty 1

## 2015-05-22 MED ORDER — SODIUM CHLORIDE 0.9 % IJ SOLN: 3.0000 mL | INTRAMUSCULAR | Status: DC | PRN

## 2015-05-22 MED ORDER — SODIUM CHLORIDE 0.9 % IV SOLN
INTRAVENOUS | Status: DC
  Administered 2015-05-22: 11:00:00 via INTRAVENOUS

## 2015-05-22 MED ORDER — NITROGLYCERIN 1 MG/10 ML FOR IR/CATH LAB
INTRA_ARTERIAL | Status: AC
  Filled 2015-05-22: qty 10

## 2015-05-22 MED ORDER — VERAPAMIL HCL 2.5 MG/ML IV SOLN
INTRAVENOUS | Status: DC | PRN
  Administered 2015-05-22: 300 ug via INTRACORONARY
  Administered 2015-05-22: 200 ug via INTRACORONARY

## 2015-05-22 MED ORDER — LABETALOL HCL 5 MG/ML IV SOLN
15.0000 mg | INTRAVENOUS | Status: DC | PRN
  Administered 2015-05-22: 15 mg via INTRAVENOUS
  Filled 2015-05-22 (×2): qty 4

## 2015-05-22 MED ORDER — LIDOCAINE HCL (PF) 1 % IJ SOLN
INTRAMUSCULAR | Status: AC
  Filled 2015-05-22: qty 30

## 2015-05-22 MED ORDER — INSULIN NPH (HUMAN) (ISOPHANE) 100 UNIT/ML ~~LOC~~ SUSP
26.0000 [IU] | Freq: Every day | SUBCUTANEOUS | Status: DC
  Administered 2015-05-22: 26 [IU] via SUBCUTANEOUS
  Filled 2015-05-22: qty 10

## 2015-05-22 MED ORDER — FENTANYL CITRATE (PF) 100 MCG/2ML IJ SOLN
INTRAMUSCULAR | Status: AC
  Administered 2015-05-22: 16:00:00 50 ug
  Filled 2015-05-22: qty 2

## 2015-05-22 MED ORDER — VITAMIN D 1000 UNITS PO TABS
2000.0000 [IU] | ORAL_TABLET | Freq: Every day | ORAL | Status: DC
  Administered 2015-05-23 – 2015-05-24 (×2): 2000 [IU] via ORAL
  Filled 2015-05-22 (×2): qty 2

## 2015-05-22 MED ORDER — HYDRALAZINE HCL 20 MG/ML IJ SOLN: 10.0000 mg | INTRAMUSCULAR | Status: DC | PRN

## 2015-05-22 MED ORDER — ADULT MULTIVITAMIN W/MINERALS CH
1.0000 | ORAL_TABLET | Freq: Every day | ORAL | Status: DC
  Administered 2015-05-23 – 2015-05-24 (×2): 1 via ORAL
  Filled 2015-05-22 (×2): qty 1

## 2015-05-22 MED ORDER — HEPARIN (PORCINE) IN NACL 2-0.9 UNIT/ML-% IJ SOLN
INTRAMUSCULAR | Status: DC | PRN
  Administered 2015-05-22: 19:00:00

## 2015-05-22 MED ORDER — ATORVASTATIN CALCIUM 10 MG PO TABS
10.0000 mg | ORAL_TABLET | Freq: Every day | ORAL | Status: DC
  Administered 2015-05-23 – 2015-05-24 (×2): 10 mg via ORAL
  Filled 2015-05-22 (×2): qty 1

## 2015-05-22 MED ORDER — FENTANYL CITRATE (PF) 100 MCG/2ML IJ SOLN
INTRAMUSCULAR | Status: DC | PRN
  Administered 2015-05-22 (×3): 50 ug via INTRAVENOUS

## 2015-05-22 MED ORDER — LATANOPROST 0.005 % OP SOLN
1.0000 [drp] | Freq: Every day | OPHTHALMIC | Status: DC
  Administered 2015-05-23: 1 [drp] via OPHTHALMIC
  Filled 2015-05-22: qty 2.5

## 2015-05-22 MED ORDER — HEPARIN (PORCINE) IN NACL 2-0.9 UNIT/ML-% IJ SOLN
INTRAMUSCULAR | Status: AC
  Filled 2015-05-22: qty 1000

## 2015-05-22 MED ORDER — SODIUM CHLORIDE 0.9 % IV SOLN
INTRAVENOUS | Status: AC
  Administered 2015-05-22 (×2): via INTRAVENOUS

## 2015-05-22 MED ORDER — MIDAZOLAM HCL 2 MG/2ML IJ SOLN
INTRAMUSCULAR | Status: DC | PRN
  Administered 2015-05-22: 2 mg via INTRAVENOUS

## 2015-05-22 MED ORDER — HEPARIN (PORCINE) IN NACL 2-0.9 UNIT/ML-% IJ SOLN
INTRAMUSCULAR | Status: AC
  Filled 2015-05-22: qty 1500

## 2015-05-22 MED ORDER — HYDROCHLOROTHIAZIDE 25 MG PO TABS
12.5000 mg | ORAL_TABLET | Freq: Every morning | ORAL | Status: DC
  Administered 2015-05-23 – 2015-05-24 (×2): 12.5 mg via ORAL
  Filled 2015-05-22: qty 0.5
  Filled 2015-05-22: qty 1

## 2015-05-22 MED ORDER — ALUM & MAG HYDROXIDE-SIMETH 200-200-20 MG/5ML PO SUSP: 30.0000 mL | Freq: Four times a day (QID) | ORAL | Status: DC | PRN

## 2015-05-22 MED ORDER — HYDRALAZINE HCL 20 MG/ML IJ SOLN: 20.0000 mg | INTRAMUSCULAR | Status: DC | PRN

## 2015-05-22 MED ORDER — ONDANSETRON HCL 4 MG/2ML IJ SOLN
INTRAMUSCULAR | Status: AC
  Filled 2015-05-22: qty 2

## 2015-05-22 MED ORDER — HYDRALAZINE HCL 20 MG/ML IJ SOLN
INTRAMUSCULAR | Status: DC | PRN
Start: 2015-05-22 — End: 2015-05-22
  Administered 2015-05-22: 20 mg via INTRAVENOUS

## 2015-05-22 MED ORDER — IOHEXOL 350 MG/ML SOLN
INTRAVENOUS | Status: DC | PRN
  Administered 2015-05-22: 40 mL via INTRA_ARTERIAL

## 2015-05-22 MED ORDER — GI COCKTAIL ~~LOC~~
30.0000 mL | Freq: Once | ORAL | Status: DC
  Filled 2015-05-22 (×2): qty 30

## 2015-05-22 MED ORDER — SODIUM CHLORIDE 0.9 % IV SOLN
250.0000 mg | INTRAVENOUS | Status: DC | PRN
  Administered 2015-05-22 (×2): 1.75 mg/kg/h via INTRAVENOUS

## 2015-05-22 MED ORDER — ONDANSETRON HCL 4 MG/2ML IJ SOLN
INTRAMUSCULAR | Status: DC | PRN
  Administered 2015-05-22: 4 mg via INTRAVENOUS

## 2015-05-22 MED ORDER — HYDRALAZINE HCL 50 MG PO TABS
50.0000 mg | ORAL_TABLET | Freq: Three times a day (TID) | ORAL | Status: DC
  Administered 2015-05-22 – 2015-05-24 (×6): 50 mg via ORAL
  Filled 2015-05-22 (×6): qty 1

## 2015-05-22 MED ORDER — VERAPAMIL HCL 2.5 MG/ML IV SOLN
INTRAVENOUS | Status: AC
  Filled 2015-05-22: qty 2

## 2015-05-22 MED ORDER — SODIUM CHLORIDE 0.9 % IJ SOLN
3.0000 mL | Freq: Two times a day (BID) | INTRAMUSCULAR | Status: DC
  Administered 2015-05-22: 3 mL via INTRAVENOUS
  Administered 2015-05-23: 10 mL via INTRAVENOUS
  Administered 2015-05-23 – 2015-05-24 (×2): 3 mL via INTRAVENOUS

## 2015-05-22 MED ORDER — DIPHENHYDRAMINE-APAP (SLEEP) 25-500 MG PO TABS: 2.0000 | ORAL_TABLET | Freq: Every day | ORAL | Status: DC

## 2015-05-22 MED ORDER — MIDAZOLAM HCL 2 MG/2ML IJ SOLN
INTRAMUSCULAR | Status: AC
  Filled 2015-05-22: qty 4

## 2015-05-22 SURGICAL SUPPLY — 13 items
CATH VISTA GUIDE 6FR XB3.5 (CATHETERS) ×2 IMPLANT
DEVICE RAD COMP TR BAND LRG (VASCULAR PRODUCTS) ×2 IMPLANT
GLIDESHEATH SLEND A-KIT 6F 22G (SHEATH) ×2 IMPLANT
KIT ENCORE 26 ADVANTAGE (KITS) ×3 IMPLANT
KIT HEART LEFT (KITS) ×3 IMPLANT
PACK CARDIAC CATHETERIZATION (CUSTOM PROCEDURE TRAY) ×3 IMPLANT
STENT RESOLUTE INTEG 3.5X26 (Permanent Stent) ×2 IMPLANT
TRANSDUCER W/STOPCOCK (MISCELLANEOUS) ×3 IMPLANT
TUBING CIL FLEX 10 FLL-RA (TUBING) ×3 IMPLANT
VALVE GUARDIAN II ~~LOC~~ HEMO (MISCELLANEOUS) ×2 IMPLANT
VALVE MANIFOLD 3 PORT W/RA/ON (MISCELLANEOUS) ×2 IMPLANT
WIRE COUGAR XT STRL 190CM (WIRE) ×2 IMPLANT
WIRE INTUITION PROPEL ST 180CM (WIRE) ×2 IMPLANT

## 2015-05-22 SURGICAL SUPPLY — 12 items
CATH INFINITI 5FR JL4 (CATHETERS) ×1 IMPLANT
DEVICE CLOSURE PERCLS PRGLD 6F (VASCULAR PRODUCTS) IMPLANT
ELECT DEFIB PAD ADLT CADENCE (PAD) ×1 IMPLANT
KIT HEART LEFT (KITS) ×2 IMPLANT
PACK CARDIAC CATHETERIZATION (CUSTOM PROCEDURE TRAY) ×2 IMPLANT
PERCLOSE PROGLIDE 6F (VASCULAR PRODUCTS) ×2
SET INTRODUCER MICROPUNCT 5F (INTRODUCER) ×1 IMPLANT
SHEATH PINNACLE 5F 10CM (SHEATH) ×1 IMPLANT
SHEATH PINNACLE 6F 10CM (SHEATH) IMPLANT
TRANSDUCER W/STOPCOCK (MISCELLANEOUS) ×2 IMPLANT
TUBING CIL FLEX 10 FLL-RA (TUBING) ×2 IMPLANT
WIRE EMERALD 3MM-J .035X150CM (WIRE) ×1 IMPLANT

## 2015-05-22 NOTE — Progress Notes (Signed)
Patient arrived on 6 central at 42 with complaints of chest pain 2/10. Blood pressure at 1450 171/58 when arrived with SB in the 50's. Patient's chest pain increased and patient given po medications, hydralazine 50 mg and imdur 60 mg. Blood pressure remains elevated with sbp 180's tp 190's. Prn labetalol not given because of SB rhythm in the 50's. Dr. Nadyne Coombes notified of patients increasing pain and consistent high blood pressures and patient given fentanyl 50 mcg IV for pain at 1600 and started on nitroglycerin infusion at 1600 at 20 mcg and titrated.Nitroglycerin infusion increased q 5 minutes by 5 mcg until 50 mcg with no change in blood pressure. Pain better after medicated with fentanyl, rated 2/10 and patient rested quietly for a brief time. Within 45 minutes patient complained of increasing pain in right side of chest and intermittent sharp pain. Blood pressure remains elevated. Dr. Nadyne Coombes notified of continued pain right side chest with sharp pain and elevated blood pressures with no change after medications given, 1716 given hydralazine 20 mg IV, amlodipine 10 mg po and coreg 12.5 mg for increased blood pressure and chest pain as ordered. Patient continues to have chest pain with elevated blood pressure and no change after medications given, increased nitroglycerin infusion to 60 mcg. Dr. Nadyne Coombes called and reported patient is again having increased pain, sharp pains in right chest. He will be into see patient. 1835 called Dr. Nadyne Coombes and requested pain medications, given fentanyl 50 mcg IV as ordered at 1837. Dr. Nadyne Coombes into see patient at 66, patient complaining of mid burning chest pain at this time. Orders received to transfer patient to ICU. Patient and patients family, remain at bedside and given emotional support and educated during this time. Questions asked and answered and reviewed all care and medications when given. Remained at bedside with patient and family and assisted by staff to allow  continuous monitoring. Dr. Nadyne Coombes discussed plan of care with patient and family prior to transfer to ICU. Patient transferred to ICU in bed with rapid response nurse and staff nurse monitoring. Family with patient during transfer.

## 2015-05-22 NOTE — Progress Notes (Signed)
Patient c/o severe chest pain. Both CP suggestive of angina that occurred in the cath lab after septal perforator occlusion and also like spasm in the right upper chest and around the back. No dyspnea. Exam no clinical  CHF. No gallop or rub.  Suspect angina/NSTEMI but also probably GI issues due to medications. Will transfer to CCU for observation. Met family members and updated to them regarding sidebranch occlusion and need for close observation as a precaution. No arrhythmias. New Lat ST depression similar to one post PCI in the lab.

## 2015-05-22 NOTE — H&P (Signed)
Ray Merritt is an 79 y.o. male.   Chief Complaint: Chest pain HPI: Ray Merritt  is a 79 y.o. male  With known CAD. He underwent coronary angiogram on 05/01/2015 and found to have hemodynamically significant mid LAD stenosis by FFR at 0.72. He was admitted last night for hydration and for elective PCI to LAD.  This was being done due to severe lifestyle limiting angina pectoris in spite of aggressive medical therapy.   Past Medical History  Diagnosis Date  . Enlarged prostate   . Hypertension   . Anemia   . Coronary artery disease   . Diabetes mellitus     insulin dependent   . Chronic kidney disease   . Arthritis     Past Surgical History  Procedure Laterality Date  . Shoulder surgery    . Cardiac catheterization N/A 05/01/2015    Procedure: Left Heart Cath and Coronary Angiography;  Surgeon: Adrian Prows, MD;  Location: [place] [facility];  Service: Cardiovascular;  Laterality: N/A;  . Cardiac catheterization  05/01/2015    Procedure: Intravascular Pressure Wire/FFR Study;  Surgeon: Adrian Prows, MD;  Location: [place] [facility];  Service: Cardiovascular;;    Family History  Problem Relation Age of Onset  . Hypertension Mother   . Heart disease Sister   . Stroke Sister   . Diabetes Brother    Social History:  reports that he has quit smoking. He has never used smokeless tobacco. He reports that he does not drink alcohol or use illicit drugs.  Allergies: No Known Allergies  Review of Systems - Negative except Bilateral calf claudicataion, uncontrolled DM, occasional leg edema  Blood pressure 170/72, pulse 73, temperature 98.1 F (36.7 C), temperature source Oral, resp. rate 20, height '6\' 3"'  (1.905 m), weight 112.175 kg (247 lb 4.8 oz), SpO2 98 %. General appearance: alert, cooperative and moderately obese Eyes: negative findings: lids and lashes normal Neck: no adenopathy, no carotid bruit, no JVD, supple, symmetrical, trachea midline and thyroid not enlarged, symmetric,  no tenderness/mass/nodules Neck: JVP - normal, carotids 2+= without bruits Resp: clear to auscultation bilaterally Chest wall: no tenderness Cardio: regular rate and rhythm, S1, S2 normal, no murmur, click, rub or gallop GI: soft, non-tender; bowel sounds normal; no masses,  no organomegaly Extremities: extremities normal, atraumatic, no cyanosis or edema Pulses: Fem normal bilateral. Left popliteal feeble and right normal. PT normal bilateral and absent DP bilateral Skin: Skin color, texture, turgor normal. No rashes or lesions Neurologic: Grossly normal  Results for orders placed or performed during the hospital encounter of 05/21/15 (from the past 48 hour(s))  Glucose, capillary     Status: None   Collection Time: 05/21/15  4:28 PM  Result Value Ref Range   Glucose-Capillary 87 65 - 99 mg/dL  CBC with Differential/Platelet     Status: Abnormal   Collection Time: 05/21/15  6:55 PM  Result Value Ref Range   WBC 11.3 (H) 4.0 - 10.5 K/uL   RBC 3.28 (L) 4.22 - 5.81 MIL/uL   Hemoglobin 9.7 (L) 13.0 - 17.0 g/dL   HCT 29.5 (L) 39.0 - 52.0 %   MCV 89.9 78.0 - 100.0 fL   MCH 29.6 26.0 - 34.0 pg   MCHC 32.9 30.0 - 36.0 g/dL   RDW 13.7 11.5 - 15.5 %   Platelets 228 150 - 400 K/uL   Neutrophils Relative % 41 %   Neutro Abs 4.6 1.7 - 7.7 K/uL   Lymphocytes Relative 51 %  Lymphs Abs 5.7 (H) 0.7 - 4.0 K/uL   Monocytes Relative 5 %   Monocytes Absolute 0.6 0.1 - 1.0 K/uL   Eosinophils Relative 3 %   Eosinophils Absolute 0.4 0.0 - 0.7 K/uL   Basophils Relative 0 %   Basophils Absolute 0.0 0.0 - 0.1 K/uL  Protime-INR     Status: None   Collection Time: 05/21/15  6:55 PM  Result Value Ref Range   Prothrombin Time 14.1 11.6 - 15.2 seconds   INR 1.07 0.00 - 1.49  Glucose, capillary     Status: Abnormal   Collection Time: 05/21/15  8:48 PM  Result Value Ref Range   Glucose-Capillary 125 (H) 65 - 99 mg/dL  Basic metabolic panel     Status: Abnormal   Collection Time: 05/22/15  4:55 AM   Result Value Ref Range   Sodium 137 135 - 145 mmol/L   Potassium 4.7 3.5 - 5.1 mmol/L   Chloride 109 101 - 111 mmol/L   CO2 21 (L) 22 - 32 mmol/L   Glucose, Bld 113 (H) 65 - 99 mg/dL   BUN 44 (H) 6 - 20 mg/dL   Creatinine, Ser 2.33 (H) 0.61 - 1.24 mg/dL   Calcium 8.8 (L) 8.9 - 10.3 mg/dL   GFR calc non Af Amer 25 (L) >60 mL/min   GFR calc Af Amer 29 (L) >60 mL/min    Comment: (NOTE) The eGFR has been calculated using the CKD EPI equation. This calculation has not been validated in all clinical situations. eGFR's persistently <60 mL/min signify possible Chronic Kidney Disease.    Anion gap 7 5 - 15  Glucose, capillary     Status: Abnormal   Collection Time: 05/22/15  6:08 AM  Result Value Ref Range   Glucose-Capillary 123 (H) 65 - 99 mg/dL  Glucose, capillary     Status: Abnormal   Collection Time: 05/22/15 11:17 AM  Result Value Ref Range   Glucose-Capillary 197 (H) 65 - 99 mg/dL   Comment 1 Notify RN   POCT Activated clotting time     Status: None   Collection Time: 05/22/15  1:37 PM  Result Value Ref Range   Activated Clotting Time 417 seconds  Glucose, capillary     Status: Abnormal   Collection Time: 05/22/15  2:55 PM  Result Value Ref Range   Glucose-Capillary 133 (H) 65 - 99 mg/dL   Comment 1 Notify RN   Glucose, capillary     Status: Abnormal   Collection Time: 05/22/15  5:15 PM  Result Value Ref Range   Glucose-Capillary 153 (H) 65 - 99 mg/dL   Comment 1 Notify RN    No results found.  Labs:   Lab Results  Component Value Date   WBC 11.3* 05/21/2015   HGB 9.7* 05/21/2015   HCT 29.5* 05/21/2015   MCV 89.9 05/21/2015   PLT 228 05/21/2015    Recent Labs Lab 05/22/15 0455  NA 137  K 4.7  CL 109  CO2 21*  BUN 44*  CREATININE 2.33*  CALCIUM 8.8*  GLUCOSE 113*    Lipid Panel     Component Value Date/Time   TRIG 217* 08/21/2014   HDL 37 08/21/2014   LDLCALC 87 08/21/2014    BNP (last 3 results) No results for input(s): BNP in the last 8760  hours.  ProBNP (last 3 results) No results for input(s): PROBNP in the last 8760 hours.  HEMOGLOBIN A1C Lab Results  Component Value Date   HGBA1C 8.0*  04/05/2015    Cardiac Panel (last 3 results) No results for input(s): CKTOTAL, CKMB, TROPONINI, RELINDX in the last 8760 hours.  No results found for: CKTOTAL, CKMB, CKMBINDEX, TROPONINI   TSH  Recent Labs  02/27/15 1004  TSH 2.06    EKG: normal EKG, normal sinus rhythm, unchanged from previous tracings.  Medications Prior to Admission  Medication Sig Dispense Refill  . aspirin 81 MG tablet Take 81 mg by mouth daily.    Marland Kitchen atorvastatin (LIPITOR) 10 MG tablet Take 10 mg by mouth daily.    . brimonidine (ALPHAGAN) 0.15 % ophthalmic solution Place 1 drop into both eyes 2 (two) times daily.    . carvedilol (COREG) 12.5 MG tablet Take 12.5 mg by mouth 2 (two) times daily with a meal.    . cholecalciferol (VITAMIN D) 1000 UNITS tablet Take 2,000 Units by mouth daily.     . clopidogrel (PLAVIX) 75 MG tablet Take 1 tablet (75 mg total) by mouth daily with breakfast. 30 tablet 1  . diphenhydramine-acetaminophen (TYLENOL PM) 25-500 MG TABS tablet Take 2 tablets by mouth at bedtime.    Marland Kitchen glucose blood (BAYER CONTOUR NEXT TEST) test strip Use as instructed to check blood sugar 2 times per day dx code E11.65 (Patient taking differently: 1 each by Other route See admin instructions. Check blood sugar 3 times daily) 100 each 12  . hydrALAZINE (APRESOLINE) 50 MG tablet Take 50 mg by mouth 3 (three) times daily.  0  . hydrochlorothiazide (HYDRODIURIL) 12.5 MG tablet Take 12.5 mg by mouth every morning.  0  . insulin NPH (HUMULIN N,NOVOLIN N) 100 UNIT/ML injection Inject 20-26 Units into the skin 2 (two) times daily. Inject 20 units subque in the morning and 26 units subque in the evening before bed    . isosorbide mononitrate (IMDUR) 60 MG 24 hr tablet Take 60 mg by mouth daily.  0  . Multiple Vitamin (MULTIVITAMIN WITH MINERALS) TABS tablet  Take 1 tablet by mouth daily.    Marland Kitchen NOVOLIN R 100 UNIT/ML injection INJECT 10 UNITS EVERY MORNING BEFORE BREAKFAST, 5 UNITS BEFORE LUNCH, AND 10 UNITS BEFORE SUPPER (Patient taking differently: Inject 5-6 Units into the skin 2 (two) times daily. Inject 6 units subque before breakfast and inject 5 units subque before dinner) 10 mL 3  . Travoprost, BAK Free, (TRAVATAN) 0.004 % SOLN ophthalmic solution Place 1 drop into both eyes at bedtime.    . triamcinolone cream (KENALOG) 0.1 % Apply 1 application topically daily.   0  . valsartan-hydrochlorothiazide (DIOVAN-HCT) 160-12.5 MG tablet Take 1 tablet by mouth daily. To start after surgery  0  . vitamin C (ASCORBIC ACID) 500 MG tablet Take 1,000 mg by mouth daily.        Current facility-administered medications:  .  0.9 %  sodium chloride infusion, , Intravenous, Continuous, Adrian Prows, MD, Last Rate: 50 mL/hr at 05/22/15 1129 .  0.9 %  sodium chloride infusion, 250 mL, Intravenous, PRN, Adrian Prows, MD .  0.9 %  sodium chloride infusion, , Intravenous, Continuous, Adrian Prows, MD .  acetaminophen (TYLENOL) tablet 650 mg, 650 mg, Oral, Q4H PRN, Adrian Prows, MD .  alum & mag hydroxide-simeth (MAALOX/MYLANTA) 200-200-20 MG/5ML suspension 30 mL, 30 mL, Oral, Q6H PRN, Adrian Prows, MD .  amLODipine (NORVASC) tablet 10 mg, 10 mg, Oral, Daily, Adrian Prows, MD, 10 mg at 05/22/15 1716 .  aspirin EC tablet 81 mg, 81 mg, Oral, Daily, Adrian Prows, MD .  atorvastatin (LIPITOR) tablet  10 mg, 10 mg, Oral, Daily, Adrian Prows, MD .  brimonidine (ALPHAGAN) 0.2 % ophthalmic solution 1 drop, 1 drop, Both Eyes, BID, Adrian Prows, MD .  carvedilol (COREG) tablet 12.5 mg, 12.5 mg, Oral, BID WC, Adrian Prows, MD, 12.5 mg at 05/22/15 1716 .  cholecalciferol (VITAMIN D) tablet 2,000 Units, 2,000 Units, Oral, Daily, Adrian Prows, MD .  Derrill Memo ON 05/23/2015] clopidogrel (PLAVIX) tablet 75 mg, 75 mg, Oral, Q breakfast, Adrian Prows, MD .  gi cocktail (Maalox,Lidocaine,Donnatal), 30 mL, Oral, Once, Adrian Prows, MD .  hydrALAZINE (APRESOLINE) 20 MG/ML injection, , , ,  .  hydrALAZINE (APRESOLINE) injection 20 mg, 20 mg, Intravenous, Q3H PRN, Adrian Prows, MD .  hydrALAZINE (APRESOLINE) tablet 50 mg, 50 mg, Oral, TID, Adrian Prows, MD, 50 mg at 05/22/15 1545 .  [START ON 05/23/2015] hydrochlorothiazide (HYDRODIURIL) tablet 12.5 mg, 12.5 mg, Oral, q morning - 10a, Adrian Prows, MD .  insulin aspart (novoLOG) injection 0-15 Units, 0-15 Units, Subcutaneous, TID WC, Adrian Prows, MD, 3 Units at 05/22/15 1208 .  insulin aspart (novoLOG) injection 0-5 Units, 0-5 Units, Subcutaneous, QHS, Adrian Prows, MD, 0 Units at 05/21/15 2115 .  [START ON 05/23/2015] insulin NPH Human (HUMULIN N,NOVOLIN N) injection 20 Units, 20 Units, Subcutaneous, QAC breakfast, Adrian Prows, MD .  insulin NPH Human (HUMULIN N,NOVOLIN N) injection 26 Units, 26 Units, Subcutaneous, QHS, Adrian Prows, MD .  isosorbide mononitrate (IMDUR) 24 hr tablet 60 mg, 60 mg, Oral, Daily, Adrian Prows, MD, 60 mg at 05/22/15 1545 .  labetalol (NORMODYNE,TRANDATE) injection 15 mg, 15 mg, Intravenous, Q4H PRN, Adrian Prows, MD, 15 mg at 05/22/15 0549 .  latanoprost (XALATAN) 0.005 % ophthalmic solution 1 drop, 1 drop, Both Eyes, QHS, Adrian Prows, MD .  Derrill Memo ON 05/23/2015] multivitamin with minerals tablet 1 tablet, 1 tablet, Oral, Daily, Adrian Prows, MD .  ondansetron [facility]) injection 4 mg, 4 mg, Intravenous, Q6H PRN, Adrian Prows, MD .  sodium chloride 0.9 % injection 3 mL, 3 mL, Intravenous, Q12H, Adrian Prows, MD .  sodium chloride 0.9 % injection 3 mL, 3 mL, Intravenous, PRN, Adrian Prows, MD .  vitamin C (ASCORBIC ACID) tablet 1,000 mg, 1,000 mg, Oral, Daily, Adrian Prows, MD    Assessment/Plan 1. 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 2. Benign essential hypertension 3. Hyperlipidemia 4. Uncontrolled Type II Diabetes Mellitus 5. PAD with mild symptoms of claudication. 5. CKD Stage 3-4  Rec:  Admitted for hydration and angiogram 05/22/2015. All questions answered.   Adrian Prows, MD 05/22/2015, 6:01 PM [facility] [place] Pager: [phone] Office: [phone] If no answer: Cell:  [phone]

## 2015-05-22 NOTE — Interval H&P Note (Signed)
History and Physical Interval Note:  05/22/2015 6:56 PM  Ray Merritt  has presented today for surgery, with the diagnosis of cad  The various methods of treatment have been discussed with the patient and family. After consideration of risks, benefits and other options for treatment, the patient has consented to  Procedure(s): Coronary Stent Intervention (N/A) as a surgical intervention .  The patient's history has been reviewed, patient examined, no change in status, stable for surgery.  I have reviewed the patient's chart and labs.  Questions were answered to the patient's satisfaction.   [facility] documented on 10/11/2016Cath Lab Visit (complete for each Cath Lab visit)  Clinical Evaluation Leading to the Procedure:   ACS: No.  Non-ACS:    Anginal Classification: CCS III  Anti-ischemic medical therapy: Maximal Therapy (2 or more classes of medications)  Non-Invasive Test Results: Intermediate-risk stress test findings: cardiac mortality 1-3%/year  Prior CABG: No previous CABG         Adrian Prows

## 2015-05-22 NOTE — Progress Notes (Signed)
Pt arrived to [facility] from [facility] on 11 Mcg of Nitroglycerin. Pt currently complaining of 6/10 burning chest pain. Dr. Nadyne Coombes at bedside will plan to take back to cath lab. Family updated and at bedside

## 2015-05-23 ENCOUNTER — Ambulatory Visit: Payer: Medicare Other | Admitting: Endocrinology

## 2015-05-23 ENCOUNTER — Encounter (HOSPITAL_COMMUNITY): Payer: Self-pay | Admitting: Cardiology

## 2015-05-23 DIAGNOSIS — I25119 Atherosclerotic heart disease of native coronary artery with unspecified angina pectoris: Secondary | ICD-10-CM | POA: Diagnosis not present

## 2015-05-23 DIAGNOSIS — Z794 Long term (current) use of insulin: Secondary | ICD-10-CM | POA: Diagnosis not present

## 2015-05-23 DIAGNOSIS — Z87891 Personal history of nicotine dependence: Secondary | ICD-10-CM | POA: Diagnosis not present

## 2015-05-23 DIAGNOSIS — I129 Hypertensive chronic kidney disease with stage 1 through stage 4 chronic kidney disease, or unspecified chronic kidney disease: Secondary | ICD-10-CM | POA: Diagnosis not present

## 2015-05-23 DIAGNOSIS — R079 Chest pain, unspecified: Secondary | ICD-10-CM | POA: Diagnosis not present

## 2015-05-23 DIAGNOSIS — E1159 Type 2 diabetes mellitus with other circulatory complications: Secondary | ICD-10-CM | POA: Diagnosis not present

## 2015-05-23 DIAGNOSIS — I25118 Atherosclerotic heart disease of native coronary artery with other forms of angina pectoris: Secondary | ICD-10-CM | POA: Diagnosis not present

## 2015-05-23 DIAGNOSIS — E785 Hyperlipidemia, unspecified: Secondary | ICD-10-CM | POA: Diagnosis not present

## 2015-05-23 DIAGNOSIS — I2584 Coronary atherosclerosis due to calcified coronary lesion: Secondary | ICD-10-CM | POA: Diagnosis not present

## 2015-05-23 LAB — CBC
HEMATOCRIT: 29.1 % — AB (ref 39.0–52.0)
HEMOGLOBIN: 9.7 g/dL — AB (ref 13.0–17.0)
MCH: 29.9 pg (ref 26.0–34.0)
MCHC: 33.3 g/dL (ref 30.0–36.0)
MCV: 89.8 fL (ref 78.0–100.0)
Platelets: 242 10*3/uL (ref 150–400)
RBC: 3.24 MIL/uL — ABNORMAL LOW (ref 4.22–5.81)
RDW: 13.7 % (ref 11.5–15.5)
WBC: 16.5 10*3/uL — ABNORMAL HIGH (ref 4.0–10.5)

## 2015-05-23 LAB — BASIC METABOLIC PANEL
ANION GAP: 9 (ref 5–15)
BUN: 37 mg/dL — ABNORMAL HIGH (ref 6–20)
CHLORIDE: 106 mmol/L (ref 101–111)
CO2: 24 mmol/L (ref 22–32)
Calcium: 8.8 mg/dL — ABNORMAL LOW (ref 8.9–10.3)
Creatinine, Ser: 2.12 mg/dL — ABNORMAL HIGH (ref 0.61–1.24)
GFR calc Af Amer: 33 mL/min — ABNORMAL LOW (ref >60)
GFR, EST NON AFRICAN AMERICAN: 28 mL/min — AB (ref >60)
GLUCOSE: 142 mg/dL — AB (ref 65–99)
POTASSIUM: 4.6 mmol/L (ref 3.5–5.1)
Sodium: 139 mmol/L (ref 135–145)

## 2015-05-23 LAB — GLUCOSE, CAPILLARY
GLUCOSE-CAPILLARY: 194 mg/dL — AB (ref 65–99)
GLUCOSE-CAPILLARY: 93 mg/dL (ref 65–99)
Glucose-Capillary: 232 mg/dL — ABNORMAL HIGH (ref 65–99)
Glucose-Capillary: 161 mg/dL — ABNORMAL HIGH (ref 65–99)

## 2015-05-23 LAB — TROPONIN I
TROPONIN I: 19.07 ng/mL — AB (ref <0.031)
Troponin I: 12.85 ng/mL (ref <0.031)

## 2015-05-23 MED ORDER — POTASSIUM CHLORIDE CRYS ER 20 MEQ PO TBCR: 40.0000 meq | EXTENDED_RELEASE_TABLET | Freq: Once | ORAL | Status: DC

## 2015-05-23 MED FILL — Heparin Sodium (Porcine) 2 Unit/ML in Sodium Chloride 0.9%: INTRAMUSCULAR | Qty: 1000 | Status: AC

## 2015-05-23 NOTE — Discharge Summary (Signed)
Physician Discharge Summary  Patient ID: Ray Merritt MRN: [id number] DOB/AGE: 1936/06/29 79 y.o.  Admit date: 05/21/2015 Discharge date: 05/23/2015  Primary Discharge Diagnosis 1. 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 Successful PCI to Mid LAD with 3.5 x 26 mm resolute integrity DES 2. Benign essential hypertension 3. Hyperlipidemia 4. Uncontrolled Type II Diabetes Mellitus 5. PAD with mild symptoms of claudication. 5. CKD Stage 3-4 6. Anemia of chronic disease and stable. 7. Periprocedural NSTEMI from occluded septal perforator, repeat angiogram same day due to chest pain and abnormal EKG revealing patent septal perforator and normal EKG the following morning.  Significant Diagnostic Studies:  05/01/15 Diagnostic angio: Right coronary artery: Small, nondominant and mildly diseased. Left main shows distal 20% stenosis, mild calcification. Circumflex coronary artery large dominant, ostial calcified 30-40% stenosis, some views appear to be much more significant. Appearance of ulceration which is probably due to calcification. Mild diffuse disease distally. No high-grade focal lesions evident. FFR to the circumflex coronary artery: Insignificant stenosis. FFR remained steady at 0.90 at 90 seconds of adenosine infusion. LAD: Mildly calcified. Midsegment shows a 60-70% stenosis, some views appear to be 80%. Large diagonal 1. FFR to the LAD: Patient has hemodynamically significant stenosis in the mid LAD. FFR immediately dropped to 0.72 after initiation of adenosine. Ramus intermediate: Small to moderate vessel, mild calcification evident, mild diffuse disease.  05/22/2015 Coronary angiogram:  S/P PTCA and stenting of the mid LAD with implantation of a 3.5 x 26 mm resolute integrity DES. 70% to 0% (FFR 0.72) with side-branch (septal perforator occlusion). No change in anatomy in the circumflex coronary artery  grossly.  Hospital Course:  Pt underwent coronary angiogram on 05/01/2015 due to severe lifestyle limiting angina pectoris in spite of aggressive medical therapy, which revealed mid-LAD 60-70% stenosis, some views appeared to be 80%. Due to renal function, PCI was scheduled for a later date.  He was admitted on 05/21/2015 for pre-hydration and underwent repeat catheterization on 05/22/2015 with successful PTCA and stenting of the mid LAD with implantation of a 3.5 x 26 mm resolute integrity DES via right radial access. Patient had occluded septal perforator with unsuccessful attempts to open. He developed chest pain with EKG changes in the inferior and lateral lead with ST depression post-PCI therefore he was brought back to the cath lab to reevaluate coronary anatomy to look for any edge dissection, left main dissection and also to reevaluate circumflex. The angiogram revealed widely patent stent, the septal perforator that was occluded was now patent but filled slowly, circumflex proximal and ostial lesion appeared stable and unchanged from prior angiograms, no obvious reasons for ongoing severe chest pain. Pt was monitored in cardiac ICU during which time delta troponins revealed markedly elevated and rising troponin level. Pt was ambulatory at this point with no recurrence of chest pain. He was transferred to telemetry and monitored overnight with repeat troponin this morning. EKG reveals normalization of inferior and lateral ST and T wave abnormalities with no evidence of ischemia. Troponin still increased slightly this morning, however, no recurrence of symptoms and EKG remains normal.    Recommendations on discharge: Continue to hold Valsartan for 1 week. Follow up next week as scheduled at which time we will resume if renal function remains stable.  Change plavix to Effient due to NSTEMI post-PCI.     Discharge Exam: Blood pressure 157/57, pulse 70, temperature 98.4 F (36.9 C), temperature source  Oral,  resp. rate 20, height 6\' 3"  (1.905 m), weight 113.354 kg (249 lb 14.4 oz), SpO2 98 %.   General appearance: alert, cooperative and moderately obese Eyes: negative findings: lids and lashes normal Neck: no adenopathy, no carotid bruit, no JVD, supple, symmetrical, trachea midline and thyroid not enlarged, symmetric, no tenderness/mass/nodules Resp: clear to auscultation bilaterally Chest wall: no tenderness Cardio: regular rate and rhythm, S1, S2 normal, no murmur, click, rub or gallop GI: soft, non-tender; bowel sounds normal; no masses, no organomegaly Extremities: extremities normal, atraumatic, no cyanosis or edema Pulses: Fem normal bilateral. Left popliteal feeble and right normal. PT normal bilateral and absent DP bilateral  Right groin access site and right radial artery access site normal.  Labs:   Lab Results  Component Value Date   WBC 16.5* 05/23/2015   HGB 9.7* 05/23/2015   HCT 29.1* 05/23/2015   MCV 89.8 05/23/2015   PLT 242 05/23/2015     Recent Labs Lab 05/24/15 0650  NA 136  K 4.3  CL 104  CO2 24  BUN 37*  CREATININE 2.27*  CALCIUM 9.0  GLUCOSE 117*    Lipid Panel     Component Value Date/Time   TRIG 217* 08/21/2014   HDL 37 08/21/2014   LDLCALC 87 08/21/2014    BNP (last 3 results) No results for input(s): BNP in the last 8760 hours.  ProBNP (last 3 results) No results for input(s): PROBNP in the last 8760 hours.  HEMOGLOBIN A1C Lab Results  Component Value Date   HGBA1C 8.0* 04/05/2015    Cardiac Panel (last 3 results)  Recent Labs  05/22/15 1834 05/23/15 0929 05/23/15 1510 05/24/15 0650  CKTOTAL 417*  --   --   --   CKMB 14.3*  --   --   --   TROPONINI 0.43* 12.85* 19.07* 25.48*  RELINDX 3.4*  --   --   --     Lab Results  Component Value Date   CKTOTAL 417* 05/22/2015   CKMB 14.3* 05/22/2015   TROPONINI 25.48* 05/24/2015     TSH  Recent Labs  02/27/15 1004  TSH 2.06    EKG 05/24/2015: SR at a rate of  71bpm, PAC, no evidence of ischemia. No change from EKG on 05/23/2015.   Radiology: No results found.  FOLLOW UP PLANS AND APPOINTMENTS   Follow-up Information    Follow up with Adrian Prows, MD On 06/01/2015.   Specialty:  Cardiology   Why:  at 9:45am   Contact information:   [place] [address] [phone]        Medication List    STOP taking these medications        clopidogrel 75 MG tablet  Commonly known as:  PLAVIX     hydrochlorothiazide 12.5 MG tablet  Commonly known as:  HYDRODIURIL     valsartan-hydrochlorothiazide 160-12.5 MG tablet  Commonly known as:  DIOVAN-HCT      TAKE these medications        aspirin 81 MG tablet  Take 81 mg by mouth daily.     atorvastatin 10 MG tablet  Commonly known as:  LIPITOR  Take 10 mg by mouth daily.     brimonidine 0.15 % ophthalmic solution  Commonly known as:  ALPHAGAN  Place 1 drop into both eyes 2 (two) times daily.     carvedilol 12.5 MG tablet  Commonly known as:  COREG  Take 12.5 mg by mouth 2 (two) times daily with  a meal.     cholecalciferol 1000 UNITS tablet  Commonly known as:  VITAMIN D  Take 2,000 Units by mouth daily.     diphenhydramine-acetaminophen 25-500 MG Tabs tablet  Commonly known as:  TYLENOL PM  Take 2 tablets by mouth at bedtime.     glucose blood test strip  Commonly known as:  BAYER CONTOUR NEXT TEST  Use as instructed to check blood sugar 2 times per day dx code E11.65     hydrALAZINE 50 MG tablet  Commonly known as:  APRESOLINE  Take 50 mg by mouth 3 (three) times daily.     insulin NPH Human 100 UNIT/ML injection  Commonly known as:  HUMULIN N,NOVOLIN N  Inject 20-26 Units into the skin 2 (two) times daily. Inject 20 units subque in the morning and 26 units subque in the evening before bed     isosorbide mononitrate 60 MG 24 hr tablet  Commonly known as:  IMDUR  Take 60 mg by mouth daily.     multivitamin with minerals Tabs tablet  Take 1  tablet by mouth daily.     NOVOLIN R 100 units/mL injection  Generic drug:  insulin regular  INJECT 10 UNITS EVERY MORNING BEFORE BREAKFAST, 5 UNITS BEFORE LUNCH, AND 10 UNITS BEFORE SUPPER     prasugrel 10 MG Tabs tablet  Commonly known as:  EFFIENT  Take 1 tablet (10 mg total) by mouth daily.     Travoprost (BAK Free) 0.004 % Soln ophthalmic solution  Commonly known as:  TRAVATAN  Place 1 drop into both eyes at bedtime.     triamcinolone cream 0.1 %  Commonly known as:  KENALOG  Apply 1 application topically daily.     vitamin C 500 MG tablet  Commonly known as:  ASCORBIC ACID  Take 1,000 mg by mouth daily.        Rachel Bo, NP-C 05/23/2015, 8:30 AM [facility] Pager: [phone] Office: [phone]

## 2015-05-23 NOTE — Care Management Obs Status (Signed)
MEDICARE OBSERVATION STATUS NOTIFICATION   Patient Details  Name: Ray Merritt MRN: [id number] Date of Birth: [birth year]   Medicare Observation Status Notification Given:  Yes    Rolm Baptise, RN 05/23/2015, 4:43 PM

## 2015-05-23 NOTE — Progress Notes (Signed)
CARDIAC REHAB PHASE I   PRE:  Rate/Rhythm: 65 SR    BP: sitting 181/67    SaO2: 99 RA  MODE:  Ambulation: 350 ft   POST:  Rate/Rhythm: 84 SR    BP: sitting 187/73     SaO2:   Pt sts he has a residual "soreness" in his chest. Did not increase with walking the unit. Tired after walk but no other c/o. Ed completed with good reception. Requests referral be sent to [facility]. He has been working on his diet/DM and is eager to start exercising again. Understands importance of Plavix/ASA. [id number]   [name] [place] CES, ACSM 05/23/2015 9:25 AM

## 2015-05-23 NOTE — Progress Notes (Signed)
Subjective:  No further chest pain. Feels well. Having his breakfast.  Objective:  Vital Signs in the last 24 hours: Temp:  [97.7 F (36.5 C)-98.4 F (36.9 C)] 98 F (36.7 C) (11/02 1300) Pulse Rate:  [56-83] 66 (11/02 0800) Resp:  [6-33] 19 (11/02 1200) BP: (152-197)/(51-109) 166/64 mmHg (11/02 1200) SpO2:  [96 %-100 %] 97 % (11/02 1300)  Intake/Output from previous day: 11/01 0701 - 11/02 0700 In: 817.2 [I.V.:817.2] Out: 1200 [Urine:900; Emesis/NG output:300]  Physical Exam: General appearance: alert, cooperative and moderately obese Eyes: negative findings: lids and lashes normal Neck: no adenopathy, no carotid bruit, no JVD, supple, symmetrical, trachea midline and thyroid not enlarged, symmetric, no tenderness/mass/nodules Neck: JVP - normal, carotids 2+= without bruits Resp: clear to auscultation bilaterally Chest wall: no tenderness Cardio: regular rate and rhythm, S1, S2 normal, no murmur, click, rub or gallop GI: soft, non-tender; bowel sounds normal; no masses, no organomegaly Extremities: extremities normal, atraumatic, no cyanosis or edema Pulses: Fem normal bilateral. Left popliteal feeble and right normal. PT normal bilateral and absent DP bilateral. Right femoral artery access site has healed well and right radial access without complications. Lab Results: BMP  Recent Labs  05/02/15 0506 05/22/15 0455 05/23/15 0231  NA 137 137 139  K 4.5 4.7 4.6  CL 104 109 106  CO2 23 21* 24  GLUCOSE 211* 113* 142*  BUN 39* 44* 37*  CREATININE 2.16* 2.33* 2.12*  CALCIUM 8.6* 8.8* 8.8*  GFRNONAA 28* 25* 28*  GFRAA 32* 29* 33*    CBC  Recent Labs Lab 05/21/15 1855 05/23/15 0231  WBC 11.3* 16.5*  RBC 3.28* 3.24*  HGB 9.7* 9.7*  HCT 29.5* 29.1*  PLT 228 242  MCV 89.9 89.8  MCH 29.6 29.9  MCHC 32.9 33.3  RDW 13.7 13.7  LYMPHSABS 5.7*  --   MONOABS 0.6  --   EOSABS 0.4  --   BASOSABS 0.0  --     HEMOGLOBIN A1C Lab Results  Component Value Date   HGBA1C 8.0* 04/05/2015    Cardiac Panel (last 3 results)  Recent Labs  05/22/15 1834 05/23/15 0929 05/23/15 1510  CKTOTAL 417*  --   --   CKMB 14.3*  --   --   TROPONINI 0.43* 12.85* 19.07*  RELINDX 3.4*  --   --     Recent Labs  02/27/15 1004  TSH 2.06    Recent Labs  12/13/14 1003  PROT 6.8  ALBUMIN 3.7  AST 16  ALT 18  ALKPHOS 87  BILITOT 0.28   Cardiac Studies:  EKG: 05/23/2015: Normal sinus rhythm without evidence of ischemia or infarct.  Compared to 05/22/2015 inferior and lateral ST segment depression with T-wave inversion no longer present. Assessment/Plan:  1. 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 Successful PCI to Mid LAD with 3.5 x 26 mm resolute integrity DES 2. Benign essential hypertension 3. Hyperlipidemia 4. Uncontrolled Type II Diabetes Mellitus 5. PAD with mild symptoms of claudication. 5. CKD Stage 3-4 6. Anemia of chronic disease and stable. 7. Periprocedural NSTEMI from occluded septal perforator, repeat angiogram same day due to chest pain and abnormal EKG revealing patent septal perforator and normal EKG this morning.  Recommendation: I will keep the patient in-house and get cardiac rehabilitation to see the patient, we Will discharged the patient in the morning if he remains stable.  Follow-up in serum creatinine.  Adrian Prows, M.D. 05/23/2015, 4:52 PM  [facility] [place] Pager: [phone] Office: [phone] If no answer: [phone]

## 2015-05-24 DIAGNOSIS — Z794 Long term (current) use of insulin: Secondary | ICD-10-CM | POA: Diagnosis not present

## 2015-05-24 DIAGNOSIS — I2584 Coronary atherosclerosis due to calcified coronary lesion: Secondary | ICD-10-CM | POA: Diagnosis not present

## 2015-05-24 DIAGNOSIS — Z87891 Personal history of nicotine dependence: Secondary | ICD-10-CM | POA: Diagnosis not present

## 2015-05-24 DIAGNOSIS — I129 Hypertensive chronic kidney disease with stage 1 through stage 4 chronic kidney disease, or unspecified chronic kidney disease: Secondary | ICD-10-CM | POA: Diagnosis not present

## 2015-05-24 DIAGNOSIS — E1159 Type 2 diabetes mellitus with other circulatory complications: Secondary | ICD-10-CM | POA: Diagnosis not present

## 2015-05-24 DIAGNOSIS — R079 Chest pain, unspecified: Secondary | ICD-10-CM | POA: Diagnosis not present

## 2015-05-24 DIAGNOSIS — E785 Hyperlipidemia, unspecified: Secondary | ICD-10-CM | POA: Diagnosis not present

## 2015-05-24 DIAGNOSIS — I25118 Atherosclerotic heart disease of native coronary artery with other forms of angina pectoris: Secondary | ICD-10-CM | POA: Diagnosis not present

## 2015-05-24 DIAGNOSIS — I25119 Atherosclerotic heart disease of native coronary artery with unspecified angina pectoris: Secondary | ICD-10-CM | POA: Diagnosis not present

## 2015-05-24 LAB — TROPONIN I: TROPONIN I: 25.48 ng/mL — AB (ref <0.031)

## 2015-05-24 LAB — BASIC METABOLIC PANEL
ANION GAP: 8 (ref 5–15)
BUN: 37 mg/dL — ABNORMAL HIGH (ref 6–20)
CALCIUM: 9 mg/dL (ref 8.9–10.3)
CO2: 24 mmol/L (ref 22–32)
Chloride: 104 mmol/L (ref 101–111)
Creatinine, Ser: 2.27 mg/dL — ABNORMAL HIGH (ref 0.61–1.24)
GFR calc non Af Amer: 26 mL/min — ABNORMAL LOW (ref >60)
GFR, EST AFRICAN AMERICAN: 30 mL/min — AB (ref >60)
GLUCOSE: 117 mg/dL — AB (ref 65–99)
Potassium: 4.3 mmol/L (ref 3.5–5.1)
SODIUM: 136 mmol/L (ref 135–145)

## 2015-05-24 LAB — GLUCOSE, CAPILLARY
GLUCOSE-CAPILLARY: 111 mg/dL — AB (ref 65–99)
Glucose-Capillary: 133 mg/dL — ABNORMAL HIGH (ref 65–99)

## 2015-05-24 MED ORDER — MAGNESIUM HYDROXIDE 400 MG/5ML PO SUSP: 30.0000 mL | Freq: Every day | ORAL | Status: DC | PRN

## 2015-05-24 MED ORDER — PRASUGREL HCL 10 MG PO TABS: 10.0000 mg | ORAL_TABLET | Freq: Every day | ORAL | Status: DC

## 2015-05-24 NOTE — Discharge Instructions (Signed)
Coronary Angiogram With Stent, Care After Refer to this sheet in the next few weeks. These instructions provide you with information about caring for yourself after your procedure. Your health care provider may also give you more specific instructions. Your treatment has been planned according to current medical practices, but problems sometimes occur. Call your health care provider if you have any problems or questions after your procedure. WHAT TO EXPECT AFTER THE PROCEDURE  After your procedure, it is typical to have the following:  Bruising at the catheter insertion site that usually fades within 1-2 weeks.  Blood collecting in the tissue (hematoma) that may be painful to the touch. It should usually decrease in size and tenderness within 1-2 weeks. HOME CARE INSTRUCTIONS  Take medicines only as directed by your health care provider. Blood thinners may be prescribed after your procedure to improve blood flow through the stent.  You may shower 24-48 hours after the procedure or as directed by your health care provider. Remove the bandage (dressing) and gently wash the catheter insertion site with plain soap and water. Pat the area dry with a clean towel. Do not rub the site, because this may cause bleeding.  Do not take baths, swim, or use a hot tub until your health care provider approves.  Check your catheter insertion site every day for redness, swelling, or drainage.  Do not apply powder or lotion to the site.  Do not lift over 10 lb (4.5 kg) for 5 days after your procedure or as directed by your health care provider.  Ask your health care provider when it is okay to:  Return to work or school.  Resume usual physical activities or sports.  Resume sexual activity.  Eat a heart-healthy diet. This should include plenty of fresh fruits and vegetables. Meat should be lean cuts. Avoid the following types of food:  Food that is high in salt.  Canned or highly processed food.  Food  that is high in saturated fat or sugar.  Fried food.  Make any other lifestyle changes as recommended by your health care provider. These may include:  Not using any tobacco products, including cigarettes, chewing tobacco, or electronic cigarettes.If you need help quitting, ask your health care provider.  Managing your weight.  Getting regular exercise.  Managing your blood pressure.  Limiting your alcohol intake.  Managing other health problems, such as diabetes.  If you need an MRI after your heart stent has been placed, be sure to tell the health care provider who orders the MRI that you have a heart stent.  Keep all follow-up visits as directed by your health care provider. This is important. SEEK MEDICAL CARE IF:  You have a fever.  You have chills.  You have increased bleeding from the catheter insertion site. Hold pressure on the site. SEEK IMMEDIATE MEDICAL CARE IF:  You develop chest pain or shortness of breath, feel faint, or pass out.  You have unusual pain at the catheter insertion site.  You have redness, warmth, or swelling at the catheter insertion site.  You have drainage (other than a small amount of blood on the dressing) from the catheter insertion site.  The catheter insertion site is bleeding, and the bleeding does not stop after 30 minutes of holding steady pressure on the site.  You develop bleeding from any other place, such as from your rectum. There may be bright red blood in your urine or stool, or it may appear as black, tarry stool.  This information is not intended to replace advice given to you by your health care provider. Make sure you discuss any questions you have with your health care provider.   Document Released: 01/24/2005 Document Revised: 07/28/2014 Document Reviewed: 11/29/2012 Elsevier Interactive Patient Education [employer].

## 2015-05-24 NOTE — Discharge Summary (Signed)
Pt got discharged, discharge instructions provided and patient showed understanding to it, IV taken out,Telemonitor DC,pt left unit in wheelchair with all of the belongings. 

## 2015-06-01 DIAGNOSIS — I2510 Atherosclerotic heart disease of native coronary artery without angina pectoris: Secondary | ICD-10-CM | POA: Diagnosis not present

## 2015-06-01 DIAGNOSIS — E1165 Type 2 diabetes mellitus with hyperglycemia: Secondary | ICD-10-CM | POA: Diagnosis not present

## 2015-06-01 DIAGNOSIS — I5032 Chronic diastolic (congestive) heart failure: Secondary | ICD-10-CM | POA: Diagnosis not present

## 2015-06-01 DIAGNOSIS — E1122 Type 2 diabetes mellitus with diabetic chronic kidney disease: Secondary | ICD-10-CM | POA: Diagnosis not present

## 2015-06-01 DIAGNOSIS — I10 Essential (primary) hypertension: Secondary | ICD-10-CM | POA: Diagnosis not present

## 2015-06-04 DIAGNOSIS — R972 Elevated prostate specific antigen [PSA]: Secondary | ICD-10-CM | POA: Diagnosis not present

## 2015-06-05 ENCOUNTER — Other Ambulatory Visit (INDEPENDENT_AMBULATORY_CARE_PROVIDER_SITE_OTHER): Payer: Medicare Other

## 2015-06-05 DIAGNOSIS — IMO0002 Reserved for concepts with insufficient information to code with codable children: Secondary | ICD-10-CM

## 2015-06-05 DIAGNOSIS — E1165 Type 2 diabetes mellitus with hyperglycemia: Secondary | ICD-10-CM | POA: Diagnosis not present

## 2015-06-05 LAB — LIPID PANEL
Cholesterol: 137 mg/dL (ref 0–200)
HDL: 39.6 mg/dL (ref >39.00)
LDL Cholesterol: 79 mg/dL (ref 0–99)
NONHDL: 97.06
TRIGLYCERIDES: 91 mg/dL (ref 0.0–149.0)
Total CHOL/HDL Ratio: 3
VLDL: 18.2 mg/dL (ref 0.0–40.0)

## 2015-06-05 LAB — GLUCOSE, RANDOM: Glucose, Bld: 122 mg/dL — ABNORMAL HIGH (ref 70–99)

## 2015-06-05 LAB — MICROALBUMIN / CREATININE URINE RATIO
CREATININE, U: 92.8 mg/dL
MICROALB UR: 65.6 mg/dL — AB (ref 0.0–1.9)
MICROALB/CREAT RATIO: 70.7 mg/g — AB (ref 0.0–30.0)

## 2015-06-06 LAB — FRUCTOSAMINE: FRUCTOSAMINE: 249 umol/L (ref 0–285)

## 2015-06-08 DIAGNOSIS — N401 Enlarged prostate with lower urinary tract symptoms: Secondary | ICD-10-CM | POA: Diagnosis not present

## 2015-06-08 DIAGNOSIS — R351 Nocturia: Secondary | ICD-10-CM | POA: Diagnosis not present

## 2015-06-11 ENCOUNTER — Encounter: Payer: Self-pay | Admitting: Endocrinology

## 2015-06-11 ENCOUNTER — Ambulatory Visit (INDEPENDENT_AMBULATORY_CARE_PROVIDER_SITE_OTHER): Payer: Medicare Other | Admitting: Endocrinology

## 2015-06-11 VITALS — BP 144/84 | HR 72 | Temp 98.3°F | Resp 16 | Ht 75.0 in | Wt 244.4 lb

## 2015-06-11 DIAGNOSIS — E041 Nontoxic single thyroid nodule: Secondary | ICD-10-CM | POA: Diagnosis not present

## 2015-06-11 DIAGNOSIS — E1165 Type 2 diabetes mellitus with hyperglycemia: Secondary | ICD-10-CM

## 2015-06-11 DIAGNOSIS — Z794 Long term (current) use of insulin: Secondary | ICD-10-CM | POA: Diagnosis not present

## 2015-06-11 NOTE — Patient Instructions (Signed)
N insulin 14 units in am and 5 Regular in am  R to be taken 1/2 before meals  Add a protein in am to Bfst  Check blood sugars on waking up   times a week Also check blood sugars about 2 hours after a meal and do this after different meals by rotation  Recommended blood sugar levels on waking up is 90-130 and about 2 hours after meal is 130-160  Please bring your blood sugar monitor to each visit, thank you

## 2015-06-11 NOTE — Progress Notes (Signed)
Patient ID: Ray Merritt, male   DOB: [birth year], 79 y.o.   MRN: [id number]           Reason for Appointment: Follow-up for Type 2 Diabetes  Referring physician: Fulp  History of Present Illness:          Date of diagnosis of type 2 diabetes mellitus : 1982        Background history:   He has been on insulin since the time of diagnosis, initially his blood sugars were significantly high ; he was symptomatic with dry mouth and blurred vision. He thinks he was probably taking NPH and Regular Insulin for quite some time and before his consultation was only on NPH 3 years ago he was put on Bydureon weekly in addition to NPH.  He thinks that it initially curb his appetite but he does not think he lost any significant amount of weight.  Also he does not think his blood sugar control was much better For about a year he has been switched from Bydureon to Tanzeum weekly A1c has been as high as 11.8 in 2/16  Recent history:   INSULIN regimen is described as: Novolin NPH 20 units acb and 26 units bedtime , Regular Insulin 6 units at breakfast and 5 supper   On his initial consultation because of high postprandial readings he was started on Regular Insulin before each meal   His baseline A1c in 6/16 was 9.4.    His  Tanzeum was stopped because of lack of benefit He was also seen by nurse educator for basic diabetes education  Because of tendency to low blood sugars in the afternoon his morning NPH was reduced by 7 units and mealtime doses were reduced also. His fructosamine up 290 indicates relatively better control  Current blood sugar patterns and problems identified:  He is still having fairly significant amount of hypoglycemia in the afternoons with blood sugars as low as 49; recently with having a midafternoon snack he has less frequent hypoglycemia  HYPOGLYCEMIA still occurring periodically at midday although his blood sugars at that time are quite variable  He is not getting any  protein at breakfast with eating oatmeal  Also his morning REGULAR insulin is being taken while he is eating instead of before eating  Blood sugars are not checked off and after supper but they are usually not high; he does take this insulin 30 minutes before eating  No recent A1c available but fructosamine is improved  checking his blood sugars as directed up to 4 times a day  Does not have any formal exercise but is relatively active in the afternoons  Other hypoglycemic drugs the patient is taking are:   none Side effects from medications have been: None  Compliance with the medical regimen: Good Hypoglycemia: with activity symptoms are weakness and feeling sweaty, treated with juice or other sweet drinks   Glucose monitoring:  done 3 times a day         Glucometer:  Contour      Blood Glucose readings by monitor download:  Mean values apply above for all meters except median for One Touch  PRE-MEAL Fasting Lunch Dinner  6PM-11PM  Overall  Glucose range:  79-173   48-215   49-160   63-274    Mean/median: 122  122   88   116   112     Self-care: The diet that the patient has been following is: tries to limit portions .  Typical meal intake: Breakfast is old-fashioned oatmeal, lunch is usually a sandwich.  He has mostly popcorn for snacks   Dinnertime is usually 6-7             Dietician visit, most recent: none  CDE consultation: 7/16               Exercise: restarting walking, mostly gardening activities  Weight history: 232  Wt Readings from Last 3 Encounters:  06/11/15 244 lb 6.4 oz (110.859 kg)  05/24/15 249 lb 14.4 oz (113.354 kg)  05/02/15 245 lb 12.8 oz (111.494 kg)    Glycemic control:   Lab Results  Component Value Date   HGBA1C 8.0* 04/05/2015   HGBA1C 9.4* 12/20/2014   Lab Results  Component Value Date   MICROALBUR 65.6* 06/05/2015   LDLCALC 79 06/05/2015   CREATININE 2.27* 05/24/2015   Microalbumin/creatinine ratio was 378 done on  12/20/14      Medication List       This list is accurate as of: 06/11/15 11:57 AM.  Always use your most recent med list.               aspirin 81 MG tablet  Take 81 mg by mouth daily.     atorvastatin 10 MG tablet  Commonly known as:  LIPITOR  Take 10 mg by mouth daily.     brimonidine 0.15 % ophthalmic solution  Commonly known as:  ALPHAGAN  Place 1 drop into both eyes 2 (two) times daily.     carvedilol 12.5 MG tablet  Commonly known as:  COREG  Take 12.5 mg by mouth 2 (two) times daily with a meal.     cholecalciferol 1000 UNITS tablet  Commonly known as:  VITAMIN D  Take 2,000 Units by mouth daily.     diphenhydramine-acetaminophen 25-500 MG Tabs tablet  Commonly known as:  TYLENOL PM  Take 2 tablets by mouth at bedtime.     glucose blood test strip  Commonly known as:  BAYER CONTOUR NEXT TEST  Use as instructed to check blood sugar 2 times per day dx code E11.65     hydrALAZINE 50 MG tablet  Commonly known as:  APRESOLINE  Take 50 mg by mouth 3 (three) times daily.     insulin NPH Human 100 UNIT/ML injection  Commonly known as:  HUMULIN N,NOVOLIN N  Inject 20-26 Units into the skin 2 (two) times daily. Inject 20 units subque in the morning and 26 units subque in the evening before bed     isosorbide mononitrate 60 MG 24 hr tablet  Commonly known as:  IMDUR  Take 60 mg by mouth daily.     multivitamin with minerals Tabs tablet  Take 1 tablet by mouth daily.     NOVOLIN R 100 units/mL injection  Generic drug:  insulin regular  INJECT 10 UNITS EVERY MORNING BEFORE BREAKFAST, 5 UNITS BEFORE LUNCH, AND 10 UNITS BEFORE SUPPER     prasugrel 10 MG Tabs tablet  Commonly known as:  EFFIENT  Take 1 tablet (10 mg total) by mouth daily.     Travoprost (BAK Free) 0.004 % Soln ophthalmic solution  Commonly known as:  TRAVATAN  Place 1 drop into both eyes at bedtime.     triamcinolone cream 0.1 %  Commonly known as:  KENALOG  Apply 1 application topically  daily.     vitamin C 500 MG tablet  Commonly known as:  ASCORBIC ACID  Take 1,000 mg by mouth  daily.        Allergies: No Known Allergies  Past Medical History  Diagnosis Date  . Enlarged prostate   . Hypertension   . Anemia   . Coronary artery disease   . Diabetes mellitus     insulin dependent   . Chronic kidney disease   . Arthritis     Past Surgical History  Procedure Laterality Date  . Shoulder surgery    . Cardiac catheterization N/A 05/01/2015    Procedure: Left Heart Cath and Coronary Angiography;  Surgeon: Adrian Prows, MD;  Location: [facility];  Service: Cardiovascular;  Laterality: N/A;  . Cardiac catheterization  05/01/2015    Procedure: Intravascular Pressure Wire/FFR Study;  Surgeon: Adrian Prows, MD;  Location: [facility];  Service: Cardiovascular;;  . Cardiac catheterization N/A 05/22/2015    Procedure: Left Heart Cath and Coronary Angiography;  Surgeon: Adrian Prows, MD;  Location: [facility];  Service: Cardiovascular;  Laterality: N/A;  . Cardiac catheterization N/A 05/22/2015    Procedure: Coronary Stent Intervention;  Surgeon: Adrian Prows, MD;  Location: [facility];  Service: Cardiovascular;  Laterality: N/A;    Family History  Problem Relation Age of Onset  . Hypertension Mother   . Heart disease Sister   . Stroke Sister   . Diabetes Brother     Social History:  reports that he has quit smoking. He has never used smokeless tobacco. He reports that he does not drink alcohol or use illicit drugs.    Review of Systems   THYROID nodule: He has a dominant left-sided thyroid nodule and did have a biopsy done Results as follows: BENIGN FOLLICULAR CELLS, COLLOID AND MACROPHAGES, CONSISTENT WITH  BENIGN GOITROUS NODULE   Lipid history: Has been on medication for a few years with Lipitor   Lab Results  Component Value Date   CHOL 137 06/05/2015   HDL 39.60 06/05/2015   LDLCALC 79 06/05/2015   TRIG 91.0 06/05/2015   CHOLHDL 3  06/05/2015          Eyes: Marland Kitchen  Most recent eye exam was in 08/2014   Has no numbness, burning, pains or tingling in feet   Diabetic foot exam done in 7/16  RENAL dysfunction: He is followed by nephrologist but appears to have somewhat improved creatinine level since his last visit in September   Lab Results  Component Value Date   CREATININE 2.27* 05/24/2015   CREATININE 2.12* 05/23/2015   CREATININE 2.33* 05/22/2015     Physical Examination:  BP 144/84 mmHg  Pulse 72  Temp(Src) 98.3 F (36.8 C)  Resp 16  Ht 6\' 3"  (1.905 m)  Wt 244 lb 6.4 oz (110.859 kg)  BMI 30.55 kg/m2  SpO2 98%    No edema present    ASSESSMENT:  Diabetes type 2, uncontrolled    He has had long-standing diabetes and is insulin deficient See history of present illness for detailed discussion of his current management, blood sugar patterns and problems identified  Currently on NPH twice a day and Regular Insulin at breakfast and supper  His blood sugars continue to be relatively lower during the daytime with hypoglycemia at lunch and in the afternoons periodically This is despite progressively reducing his insulin doses His renal function is better and he still appears to be needing less insulin  Some of his hypoglycemia is now  around 12 noon and may be partly related to his not taking any protein at breakfast as  discussed Occasionally may be more active in the afternoon and causing low sugars.  His blood sugar fluctuation may be partly related to taking NPH and Generic regular insulin  THYROID nodule: Discussed biopsy results today and indicated that he has a benign nodule and will continue following clinically  PLAN:   Reduce insulin doses as below, he will take 14 NPH in the morning but the same dose at bedtime  To try and have a regular snack late morning and also midafternoon especially with activity  Balanced meals with protein.  He wants to added yogurt to his half cup of oatmeal in  the morning  Call if blood sugars are consistently high or low  Consider consultation with dietitian  Patient Instructions  N insulin 14 units in am and 5 Regular in am  R to be taken 1/2 before meals  Add a protein in am to Bfst  Check blood sugars on waking up   times a week Also check blood sugars about 2 hours after a meal and do this after different meals by rotation  Recommended blood sugar levels on waking up is 90-130 and about 2 hours after meal is 130-160  Please bring your blood sugar monitor to each visit, thank you       Counseling time on subjects discussed above is over 50% of today's 25 minute visi  Gladis Soley 06/11/2015, 11:57 AM   Note: This office note was prepared with Estate agent. Any transcriptional errors that result from this process are unintentional.

## 2015-06-19 ENCOUNTER — Ambulatory Visit (HOSPITAL_BASED_OUTPATIENT_CLINIC_OR_DEPARTMENT_OTHER): Payer: Medicare Other | Admitting: Internal Medicine

## 2015-06-19 ENCOUNTER — Other Ambulatory Visit (HOSPITAL_BASED_OUTPATIENT_CLINIC_OR_DEPARTMENT_OTHER): Payer: Medicare Other

## 2015-06-19 ENCOUNTER — Encounter: Payer: Self-pay | Admitting: Internal Medicine

## 2015-06-19 ENCOUNTER — Telehealth: Payer: Self-pay | Admitting: Internal Medicine

## 2015-06-19 VITALS — BP 162/68 | HR 69 | Temp 98.1°F | Resp 19 | Ht 75.0 in | Wt 245.9 lb

## 2015-06-19 DIAGNOSIS — N189 Chronic kidney disease, unspecified: Secondary | ICD-10-CM

## 2015-06-19 DIAGNOSIS — Z8579 Personal history of other malignant neoplasms of lymphoid, hematopoietic and related tissues: Secondary | ICD-10-CM | POA: Diagnosis not present

## 2015-06-19 DIAGNOSIS — C9110 Chronic lymphocytic leukemia of B-cell type not having achieved remission: Secondary | ICD-10-CM

## 2015-06-19 LAB — CBC WITH DIFFERENTIAL/PLATELET
BASO%: 0.4 % (ref 0.0–2.0)
BASOS ABS: 0 10*3/uL (ref 0.0–0.1)
EOS%: 3.9 % (ref 0.0–7.0)
Eosinophils Absolute: 0.4 10*3/uL (ref 0.0–0.5)
HEMATOCRIT: 29.8 % — AB (ref 38.4–49.9)
HEMOGLOBIN: 9.5 g/dL — AB (ref 13.0–17.1)
LYMPH#: 4.9 10*3/uL — AB (ref 0.9–3.3)
LYMPH%: 51.8 % — ABNORMAL HIGH (ref 14.0–49.0)
MCH: 28.3 pg (ref 27.2–33.4)
MCHC: 31.9 g/dL — ABNORMAL LOW (ref 32.0–36.0)
MCV: 88.7 fL (ref 79.3–98.0)
MONO#: 0.4 10*3/uL (ref 0.1–0.9)
MONO%: 4.1 % (ref 0.0–14.0)
NEUT#: 3.8 10*3/uL (ref 1.5–6.5)
NEUT%: 39.8 % (ref 39.0–75.0)
PLATELETS: 222 10*3/uL (ref 140–400)
RBC: 3.36 10*6/uL — ABNORMAL LOW (ref 4.20–5.82)
RDW: 14.2 % (ref 11.0–14.6)
WBC: 9.5 10*3/uL (ref 4.0–10.3)

## 2015-06-19 LAB — COMPREHENSIVE METABOLIC PANEL (CC13)
ALBUMIN: 3.4 g/dL — AB (ref 3.5–5.0)
ALK PHOS: 61 U/L (ref 40–150)
ALT: 15 U/L (ref 0–55)
ANION GAP: 7 meq/L (ref 3–11)
AST: 13 U/L (ref 5–34)
BUN: 40.7 mg/dL — AB (ref 7.0–26.0)
CALCIUM: 9.1 mg/dL (ref 8.4–10.4)
CO2: 19 mEq/L — ABNORMAL LOW (ref 22–29)
CREATININE: 2.7 mg/dL — AB (ref 0.7–1.3)
Chloride: 113 mEq/L — ABNORMAL HIGH (ref 98–109)
EGFR: 21 mL/min/{1.73_m2} — ABNORMAL LOW (ref >90)
Glucose: 143 mg/dl — ABNORMAL HIGH (ref 70–140)
POTASSIUM: 4.9 meq/L (ref 3.5–5.1)
Sodium: 139 mEq/L (ref 136–145)
Total Bilirubin: <0.3 mg/dL (ref 0.20–1.20)
Total Protein: 6.2 g/dL — ABNORMAL LOW (ref 6.4–8.3)

## 2015-06-19 LAB — LACTATE DEHYDROGENASE (CC13): LDH: 199 U/L (ref 125–245)

## 2015-06-19 NOTE — Telephone Encounter (Signed)
per pof to sch pt appt-gave pt copy of avs °

## 2015-06-19 NOTE — Progress Notes (Signed)
[facility] [phone]   [phone]  OFFICE PROGRESS NOTE  FULP, CAMMIE, MD [address] [id number]  DIAGNOSIS: Chronic lymphocytic leukemia diagnosed in July 2015  PRIOR THERAPY: None  CURRENT THERAPY: Observation.  INTERVAL HISTORY: Ray Merritt 79 y.o. male returns to the clinic today for six-month follow-up visit. The patient was diagnosed with chronic lymphocytic leukemia in July 2015. He has been observation since that time with no significant complaints. He is feeling fine today and denied having any significant fatigue or weakness. He denied having any weight loss or night sweats. The patient denied having any chest pain, shortness of breath, cough or hemoptysis. He has no palpable lymphadenopathy. He has a history of chronic heart disease and had stent placed a few months ago. He has repeat CBC performed earlier today and he is here for evaluation and discussion of his lab results.  MEDICAL HISTORY: Past Medical History  Diagnosis Date  . Enlarged prostate   . Hypertension   . Anemia   . Coronary artery disease   . Diabetes mellitus     insulin dependent   . Chronic kidney disease   . Arthritis     ALLERGIES:  has No Known Allergies.  MEDICATIONS:  Current Outpatient Prescriptions  Medication Sig Dispense Refill  . Ascorbic Acid (VITAMIN C) 1000 MG tablet Take 1,000 mg by mouth daily.    Marland Kitchen aspirin 81 MG tablet Take 81 mg by mouth daily.    Marland Kitchen atorvastatin (LIPITOR) 10 MG tablet Take 10 mg by mouth daily.    . brimonidine (ALPHAGAN) 0.15 % ophthalmic solution Place 1 drop into both eyes 2 (two) times daily.    . carvedilol (COREG) 12.5 MG tablet Take 12.5 mg by mouth 2 (two) times daily with a meal.    . cholecalciferol (VITAMIN D) 1000 UNITS tablet Take 2,000 Units by mouth daily.     . diphenhydramine-acetaminophen (TYLENOL PM) 25-500 MG TABS tablet Take 2 tablets by mouth at bedtime.    Marland Kitchen glucose blood  (BAYER CONTOUR NEXT TEST) test strip Use as instructed to check blood sugar 2 times per day dx code E11.65 (Patient taking differently: 1 each by Other route See admin instructions. Check blood sugar 3 times daily) 100 each 12  . hydrALAZINE (APRESOLINE) 50 MG tablet Take 50 mg by mouth 3 (three) times daily.  0  . insulin NPH (HUMULIN N,NOVOLIN N) 100 UNIT/ML injection Inject 20-26 Units into the skin 2 (two) times daily. Inject 20 units subque in the morning and 26 units subque in the evening before bed    . isosorbide mononitrate (IMDUR) 60 MG 24 hr tablet Take 60 mg by mouth daily.  0  . Multiple Vitamin (MULTIVITAMIN WITH MINERALS) TABS tablet Take 1 tablet by mouth daily.    Marland Kitchen NOVOLIN R 100 UNIT/ML injection INJECT 10 UNITS EVERY MORNING BEFORE BREAKFAST, 5 UNITS BEFORE LUNCH, AND 10 UNITS BEFORE SUPPER (Patient taking differently: Inject 5-6 Units into the skin 2 (two) times daily. Inject 6 units subque before breakfast and inject 5 units subque before dinner) 10 mL 3  . prasugrel (EFFIENT) 10 MG TABS tablet Take 1 tablet (10 mg total) by mouth daily. 30 tablet 0  . Travoprost, BAK Free, (TRAVATAN) 0.004 % SOLN ophthalmic solution Place 1 drop into both eyes at bedtime.    . triamcinolone cream (KENALOG) 0.1 % Apply 1 application topically daily.   0   No current  facility-administered medications for this visit.    SURGICAL HISTORY:  Past Surgical History  Procedure Laterality Date  . Shoulder surgery    . Cardiac catheterization N/A 05/01/2015    Procedure: Left Heart Cath and Coronary Angiography;  Surgeon: Adrian Prows, MD;  Location: [facility];  Service: Cardiovascular;  Laterality: N/A;  . Cardiac catheterization  05/01/2015    Procedure: Intravascular Pressure Wire/FFR Study;  Surgeon: Adrian Prows, MD;  Location: [facility];  Service: Cardiovascular;;  . Cardiac catheterization N/A 05/22/2015    Procedure: Left Heart Cath and Coronary Angiography;  Surgeon: Adrian Prows,  MD;  Location: [facility];  Service: Cardiovascular;  Laterality: N/A;  . Cardiac catheterization N/A 05/22/2015    Procedure: Coronary Stent Intervention;  Surgeon: Adrian Prows, MD;  Location: [facility];  Service: Cardiovascular;  Laterality: N/A;    REVIEW OF SYSTEMS:  A comprehensive review of systems was negative except for: Constitutional: positive for fatigue   PHYSICAL EXAMINATION: General appearance: alert, cooperative and no distress Head: Normocephalic, without obvious abnormality, atraumatic Neck: no adenopathy, no JVD, supple, symmetrical, trachea midline and thyroid not enlarged, symmetric, no tenderness/mass/nodules Lymph nodes: Cervical, supraclavicular, and axillary nodes normal. Resp: clear to auscultation bilaterally Back: symmetric, no curvature. ROM normal. No CVA tenderness. Cardio: regular rate and rhythm, S1, S2 normal, no murmur, click, rub or gallop GI: soft, non-tender; bowel sounds normal; no masses,  no organomegaly Extremities: extremities normal, atraumatic, no cyanosis or edema Neurologic: Alert and oriented X 3, normal strength and tone. Normal symmetric reflexes. Normal coordination and gait  ECOG PERFORMANCE STATUS: 1 - Symptomatic but completely ambulatory  Blood pressure 162/68, pulse 69, temperature 98.1 F (36.7 C), temperature source Oral, resp. rate 19, height 6\' 3"  (1.905 m), weight 245 lb 14.4 oz (111.54 kg), SpO2 99 %.  LABORATORY DATA: Lab Results  Component Value Date   WBC 9.5 06/19/2015   HGB 9.5* 06/19/2015   HCT 29.8* 06/19/2015   MCV 88.7 06/19/2015   PLT 222 06/19/2015      Chemistry      Component Value Date/Time   NA 139 06/19/2015 0954   NA 136 05/24/2015 0650   K 4.9 06/19/2015 0954   K 4.3 05/24/2015 0650   CL 104 05/24/2015 0650   CO2 19* 06/19/2015 0954   CO2 24 05/24/2015 0650   BUN 40.7* 06/19/2015 0954   BUN 37* 05/24/2015 0650   CREATININE 2.7* 06/19/2015 0954   CREATININE 2.27* 05/24/2015 0650        Component Value Date/Time   CALCIUM 9.1 06/19/2015 0954   CALCIUM 9.0 05/24/2015 0650   ALKPHOS 61 06/19/2015 0954   ALKPHOS 64 07/16/2009 1108   AST 13 06/19/2015 0954   AST 23 07/16/2009 1108   ALT 15 06/19/2015 0954   ALT 29 07/16/2009 1108   BILITOT <0.30 06/19/2015 0954   BILITOT 0.6 07/16/2009 1108       RADIOGRAPHIC STUDIES: No results found.  ASSESSMENT AND PLAN: This is a very pleasant 79 years old African-American male diagnosed with chronic lymphocytic leukemia and currently on observation. The patient is feeling fine today with no specific complaints.  His CBC today showed decrease in the total white blood count to the normal range. I discussed the lab result with the patient. I recommended for him to continue on observation with repeat CBC, comprehensive metabolic panel and LDH in 6 months. He was advised to call immediately if he has any concerning symptoms in  the interval. The patient voices understanding of current disease status and treatment options and is in agreement with the current care plan.  All questions were answered. The patient knows to call the clinic with any problems, questions or concerns. We can certainly see the patient much sooner if necessary.  Disclaimer: This note was dictated with voice recognition software. Similar sounding words can inadvertently be transcribed and may not be corrected upon review.

## 2015-06-21 ENCOUNTER — Encounter (HOSPITAL_COMMUNITY)
Admission: RE | Admit: 2015-06-21 | Discharge: 2015-06-21 | Disposition: A | Discharge status: Home / Self Care | Payer: Medicare Other | Source: Ambulatory Visit | Attending: Cardiology | Admitting: Cardiology

## 2015-06-21 DIAGNOSIS — Z955 Presence of coronary angioplasty implant and graft: Secondary | ICD-10-CM | POA: Insufficient documentation

## 2015-06-21 NOTE — Progress Notes (Signed)
Cardiac Rehab Medication Review by a Pharmacist  Does the patient  feel that his/her medications are working for him/her?  yes  Has the patient been experiencing any side effects to the medications prescribed?  no  Does the patient measure his/her own blood pressure or blood glucose at home?  yes - when he feels light headed or if it feels high he will.  Does the patient have any problems obtaining medications due to transportation or finances?   no  Understanding of regimen: excellent Understanding of indications: excellent Potential of compliance: excellent    Pharmacist comments: Ray Merritt is a very pleasant man. He is very comfortable with his medications and knows why he is taking his medications. We talked the most about his blood pressure medications. He monitors this at home anytime he doesn't feel right.     [name] 06/21/2015 8:42 AM

## 2015-06-25 ENCOUNTER — Encounter (HOSPITAL_COMMUNITY)
Admission: RE | Admit: 2015-06-25 | Discharge: 2015-06-25 | Disposition: A | Discharge status: Home / Self Care | Payer: Medicare Other | Source: Ambulatory Visit | Attending: Cardiology | Admitting: Cardiology

## 2015-06-25 DIAGNOSIS — Z955 Presence of coronary angioplasty implant and graft: Secondary | ICD-10-CM | POA: Diagnosis not present

## 2015-06-25 LAB — GLUCOSE, CAPILLARY
GLUCOSE-CAPILLARY: 119 mg/dL — AB (ref 65–99)
GLUCOSE-CAPILLARY: 112 mg/dL — AB (ref 65–99)

## 2015-06-25 NOTE — Progress Notes (Signed)
Pt started exercise today in the 6:45 Phase II cardiac rehab program.  Pt tolerated light exercise without difficulty. VSS, Pt pre exercise blood glucose was 119. Pt stated that his home fasting cbg was 79.  Pt took his usual dose of insulin which consist of NPH and Regular insulin. Pt ate breakfast.  Pt has a snack with him.  Advised him to eat the yogurt. Pt breakfast this morning lacked protein for glucose stability.  Pt advised that on on exercise days if his blood glucose is less than 100 to hold his insulin until after exercise to prevent a hypoglycemic event. Pt verbalized understanding. Telemetry-SR, asymptomatic.  Medication list reconciled.  Pt verbalized compliance with medications and denies barriers to compliance. PSYCHOSOCIAL ASSESSMENT:  PHQ-0. Pt exhibits positive coping skills, hopeful outlook with supportive family. No psychosocial needs identified at this time, no psychosocial interventions necessary.    Pt enjoys working in his yard and his great grandkids. Pt picks the school age great grand as well as the 77 year old up while the mother works.   Pt cardiac rehab  goal is  to better breathing.  Pt encouraged to participate in home exercise to increase ability to achieve these goals.   Pt long term cardiac rehab goal is lose 30 pounds. Pt has been successful in the past to lose weight with increase activity and portion control. Pt and his wife are encouraged to participate in the nutrition classes on Tuesdays. Pt oriented to exercise equipment and routine.  Understanding verbalized.Cherre Huger, BSN

## 2015-06-27 ENCOUNTER — Encounter (HOSPITAL_COMMUNITY)
Admission: RE | Admit: 2015-06-27 | Discharge: 2015-06-27 | Disposition: A | Discharge status: Home / Self Care | Payer: Medicare Other | Source: Ambulatory Visit | Attending: Cardiology | Admitting: Cardiology

## 2015-06-27 DIAGNOSIS — I10 Essential (primary) hypertension: Secondary | ICD-10-CM | POA: Diagnosis not present

## 2015-06-27 DIAGNOSIS — E8770 Fluid overload, unspecified: Secondary | ICD-10-CM | POA: Diagnosis not present

## 2015-06-27 DIAGNOSIS — Z955 Presence of coronary angioplasty implant and graft: Secondary | ICD-10-CM | POA: Diagnosis not present

## 2015-06-27 DIAGNOSIS — N183 Chronic kidney disease, stage 3 (moderate): Secondary | ICD-10-CM | POA: Diagnosis not present

## 2015-06-27 DIAGNOSIS — C9110 Chronic lymphocytic leukemia of B-cell type not having achieved remission: Secondary | ICD-10-CM | POA: Diagnosis not present

## 2015-06-27 LAB — GLUCOSE, CAPILLARY
GLUCOSE-CAPILLARY: 96 mg/dL (ref 65–99)
GLUCOSE-CAPILLARY: 126 mg/dL — AB (ref 65–99)

## 2015-06-28 DIAGNOSIS — E875 Hyperkalemia: Secondary | ICD-10-CM | POA: Diagnosis not present

## 2015-06-29 ENCOUNTER — Encounter (HOSPITAL_COMMUNITY)
Admission: RE | Admit: 2015-06-29 | Discharge: 2015-06-29 | Disposition: A | Discharge status: Home / Self Care | Payer: Medicare Other | Source: Ambulatory Visit | Attending: Cardiology | Admitting: Cardiology

## 2015-06-29 DIAGNOSIS — Z955 Presence of coronary angioplasty implant and graft: Secondary | ICD-10-CM | POA: Diagnosis not present

## 2015-06-29 LAB — GLUCOSE, CAPILLARY
Glucose-Capillary: 124 mg/dL — ABNORMAL HIGH (ref 65–99)
Glucose-Capillary: 146 mg/dL — ABNORMAL HIGH (ref 65–99)

## 2015-06-29 NOTE — Progress Notes (Signed)
Pt hypertensive at cardiac rehab.  BP:  164/70 at rest pre exercise.  Pt asymptomatic.  Pt reports he took morning medications at 6AM.  Pt ate breakfast.  Pt tolerated light exercise without difficulty with little moderation in BP.  Rehab report will be faxed to Dr. Einar Gip for review.

## 2015-07-02 ENCOUNTER — Encounter (HOSPITAL_COMMUNITY)
Admission: RE | Admit: 2015-07-02 | Discharge: 2015-07-02 | Disposition: A | Discharge status: Home / Self Care | Payer: Medicare Other | Source: Ambulatory Visit | Attending: Cardiology | Admitting: Cardiology

## 2015-07-02 DIAGNOSIS — Z955 Presence of coronary angioplasty implant and graft: Secondary | ICD-10-CM | POA: Diagnosis not present

## 2015-07-04 ENCOUNTER — Encounter (HOSPITAL_COMMUNITY)
Admission: RE | Admit: 2015-07-04 | Discharge: 2015-07-04 | Disposition: A | Discharge status: Home / Self Care | Payer: Medicare Other | Source: Ambulatory Visit | Attending: Cardiology | Admitting: Cardiology

## 2015-07-04 DIAGNOSIS — Z955 Presence of coronary angioplasty implant and graft: Secondary | ICD-10-CM | POA: Diagnosis not present

## 2015-07-04 NOTE — Progress Notes (Signed)
.  Reviewed home exercise with pt today.  Pt plans to walk for exercise.  Reviewed THR, pulse, RPE, sign and symptoms, NTG use, and when to call 911 or MD.  Pt voiced understanding.   Dorna Bloom, [place], ACSM [employer]

## 2015-07-04 NOTE — Progress Notes (Signed)
QUALITY OF LIFE SCORE REVIEW Pt completed Quality of Life survey as a participant in Cardiac Rehab. Scores 21.0 or below are considered low. Overall 28.41, Health and Function 29.90, socioeconomic 26.43, physiological and spiritual 30.0, family 27.50. Pt scores are well above the threshold of 21.0 No needs identified, no further intervention is needed. Will continue to monitor and intervene as necessary. Cherre Huger, BSN

## 2015-07-05 ENCOUNTER — Other Ambulatory Visit: Payer: Medicare Other

## 2015-07-06 ENCOUNTER — Encounter (HOSPITAL_COMMUNITY)
Admission: RE | Admit: 2015-07-06 | Discharge: 2015-07-06 | Disposition: A | Discharge status: Home / Self Care | Payer: Medicare Other | Source: Ambulatory Visit | Attending: Cardiology | Admitting: Cardiology

## 2015-07-06 DIAGNOSIS — Z955 Presence of coronary angioplasty implant and graft: Secondary | ICD-10-CM | POA: Diagnosis not present

## 2015-07-09 ENCOUNTER — Encounter (HOSPITAL_COMMUNITY)
Admission: RE | Admit: 2015-07-09 | Discharge: 2015-07-09 | Disposition: A | Discharge status: Home / Self Care | Payer: Medicare Other | Source: Ambulatory Visit | Attending: Cardiology | Admitting: Cardiology

## 2015-07-09 DIAGNOSIS — Z955 Presence of coronary angioplasty implant and graft: Secondary | ICD-10-CM | POA: Diagnosis not present

## 2015-07-11 ENCOUNTER — Encounter (HOSPITAL_COMMUNITY)
Admission: RE | Admit: 2015-07-11 | Discharge: 2015-07-11 | Disposition: A | Discharge status: Home / Self Care | Payer: Medicare Other | Source: Ambulatory Visit | Attending: Cardiology | Admitting: Cardiology

## 2015-07-11 DIAGNOSIS — Z955 Presence of coronary angioplasty implant and graft: Secondary | ICD-10-CM | POA: Diagnosis not present

## 2015-07-13 ENCOUNTER — Encounter (HOSPITAL_COMMUNITY): Payer: Medicare Other

## 2015-07-18 ENCOUNTER — Encounter (HOSPITAL_COMMUNITY)
Admission: RE | Admit: 2015-07-18 | Discharge: 2015-07-18 | Disposition: A | Discharge status: Home / Self Care | Payer: Medicare Other | Source: Ambulatory Visit | Attending: Cardiology | Admitting: Cardiology

## 2015-07-18 DIAGNOSIS — Z955 Presence of coronary angioplasty implant and graft: Secondary | ICD-10-CM | POA: Diagnosis not present

## 2015-07-20 ENCOUNTER — Other Ambulatory Visit (INDEPENDENT_AMBULATORY_CARE_PROVIDER_SITE_OTHER): Payer: Medicare Other

## 2015-07-20 ENCOUNTER — Encounter (HOSPITAL_COMMUNITY)
Admission: RE | Admit: 2015-07-20 | Discharge: 2015-07-20 | Disposition: A | Discharge status: Home / Self Care | Payer: Medicare Other | Source: Ambulatory Visit | Attending: Cardiology | Admitting: Cardiology

## 2015-07-20 DIAGNOSIS — Z794 Long term (current) use of insulin: Secondary | ICD-10-CM | POA: Diagnosis not present

## 2015-07-20 DIAGNOSIS — E1165 Type 2 diabetes mellitus with hyperglycemia: Secondary | ICD-10-CM

## 2015-07-20 DIAGNOSIS — Z955 Presence of coronary angioplasty implant and graft: Secondary | ICD-10-CM | POA: Diagnosis not present

## 2015-07-20 LAB — BASIC METABOLIC PANEL
BUN: 42 mg/dL — ABNORMAL HIGH (ref 6–23)
CO2: 21 meq/L (ref 19–32)
Calcium: 9.2 mg/dL (ref 8.4–10.5)
Chloride: 107 mEq/L (ref 96–112)
Creatinine, Ser: 2.51 mg/dL — ABNORMAL HIGH (ref 0.40–1.50)
GFR: 26.59 mL/min — AB (ref >60.00)
GLUCOSE: 155 mg/dL — AB (ref 70–99)
POTASSIUM: 4.5 meq/L (ref 3.5–5.1)
SODIUM: 137 meq/L (ref 135–145)

## 2015-07-20 LAB — HEMOGLOBIN A1C: HEMOGLOBIN A1C: 6.8 % — AB (ref 4.6–6.5)

## 2015-07-20 NOTE — Progress Notes (Signed)
Ray Merritt 79 y.o. male Nutrition Note Spoke with pt. Nutrition Plan and Nutrition Survey goals reviewed with pt. Pt is following Step 2 of the Therapeutic Lifestyle Changes diet. Pt wants to lose wt. Pt has been trying to lose wt by changing his diet. Wt loss tips briefly reviewed. Pt is diabetic. Last A1c indicates blood glucose not optimally controlled. Pt reports he is going for an MD appointment next week and his A1c should be re-drawn and discussed at that time. This Probation officer went over Diabetes Education test results. Pt checks CBG's 3 times a day. Fasting CBG's reportedly 140-160 mg/dL. Pt with dx of CHF. Per discussion, pt does not use canned/convenience foods often. Pt rarely adds salt to food. Pt expressed understanding of the information reviewed. Pt aware of nutrition education classes offered and plans on attending nutrition classes. Lab Results  Component Value Date   HGBA1C 8.0* 04/05/2015   Nutrition Diagnosis ? Food-and nutrition-related knowledge deficit related to lack of exposure to information as related to diagnosis of: ? CVD ? DM ? Obesity related to excessive energy intake as evidenced by a BMI of 32.2  Nutrition RX/ Estimated Daily Nutrition Needs for: wt loss 1700-2200 Kcal, 45-60 gm fat, 11-14 gm sat fat, 1.6-2.2 gm trans-fat, <1500 mg sodium, 175-250 gm CHO   Nutrition Intervention ? Pt's individual nutrition plan reviewed with pt. ? Benefits of adopting Therapeutic Lifestyle Changes discussed when Medficts reviewed. ? Pt to attend the Portion Distortion class ? Pt to attend the Diabetes Q & A class ? Pt to attend the   ? Nutrition I class                  ? Nutrition II class     ? Diabetes Blitz class  ? Continue client-centered nutrition education by RD, as part of interdisciplinary care. Goal(s) ? Pt to identify food quantities necessary to achieve: ? wt loss to a goal wt of 221-239 lb (100.6-108.8 kg) at graduation from cardiac rehab.  ? CBG concentrations  in the normal range or as close to normal as is safely possible. Monitor and Evaluate progress toward nutrition goal with team. Nutrition Risk: Change to Moderate Ray Merritt, M.Ed, RD, LDN, CDE 07/20/2015 8:18 AM

## 2015-07-24 ENCOUNTER — Ambulatory Visit (INDEPENDENT_AMBULATORY_CARE_PROVIDER_SITE_OTHER): Payer: Medicare Other | Admitting: Endocrinology

## 2015-07-24 ENCOUNTER — Encounter: Payer: Self-pay | Admitting: Endocrinology

## 2015-07-24 VITALS — BP 150/86 | HR 68 | Temp 97.8°F | Resp 14 | Ht 75.0 in | Wt 247.6 lb

## 2015-07-24 DIAGNOSIS — Z794 Long term (current) use of insulin: Secondary | ICD-10-CM

## 2015-07-24 DIAGNOSIS — E1165 Type 2 diabetes mellitus with hyperglycemia: Secondary | ICD-10-CM | POA: Diagnosis not present

## 2015-07-24 NOTE — Patient Instructions (Signed)
May mix insulin in am using 6 regular and 14 N insulin total 20  6-7 Regular before supper  If oatmeal then may need a snack mid am

## 2015-07-24 NOTE — Progress Notes (Signed)
Patient ID: Ray Merritt, male   DOB: 1935-09-06, 80 y.o.   MRN: [id number]           Reason for Appointment: Follow-up for Type 2 Diabetes  Referring physician: Fulp  History of Present Illness:          Date of diagnosis of type 2 diabetes mellitus : 1982        Background history:   He has been on insulin since the time of diagnosis, initially his blood sugars were significantly high ; he was symptomatic with dry mouth and blurred vision. He thinks he was probably taking NPH and Regular Insulin for quite some time and before his consultation was only on NPH 3 years ago he was put on Bydureon weekly in addition to NPH.  He thinks that it initially curb his appetite but he does not think he lost any significant amount of weight.  Also he does not think his blood sugar control was much better For about a year he had been switched from Bydureon to Tanzeum weekly  His  Tanzeum was stopped because of lack of benefit A1c has been as high as 11.8 in 2/16  Recent history:   INSULIN regimen is described as: Novolin NPH 15 units acb and 26 units bedtime, Regular Insulin 6 units at breakfast and 5 supper   On his initial consultation because of high postprandial readings he was started on Regular Insulin before each meal He was also seen by nurse educator for basic diabetes education  Because of tendency to low blood sugars in midday his NPH was reduced by 6 units on his last visit A1c is excellent at 6.8 now  Current blood sugar patterns and problems identified:  He is not having as much hypoglycemia now with only a couple of low readings about 10-12 days ago, more when he was walking around shopping  FASTING blood sugars are quite variable but not consistently high, may be higher if he checks his blood sugar earlier in the morning  LOWEST blood sugars are still midday and early afternoon He now says that he is eating oatmeal only in the morning some days without protein  Blood  sugars at suppertime are fairly good  He has only a few readings after supper and these are relatively higher  Has difficulty losing weight and this is despite doing  cardiac rehabilitation recently  Other hypoglycemic drugs the patient is taking are:   none Side effects from medications have been: None  Compliance with the medical regimen: Good Hypoglycemia: with activity symptoms are weakness and feeling sweaty, treated with juice or other sweet drinks, usually can recognize blood sugars in the low 60s   Glucose monitoring:  done 3 times a day         Glucometer:  Contour      Blood Glucose readings by monitor download:  Mean values apply above for all meters except median for One Touch  PRE-MEAL Fasting Lunch Dinner Bedtime Overall  Glucose range:  98-273   58-126   83-247   170-266    Mean/median:  170      143+/-56      Self-care: The diet that the patient has been following is: tries to limit portions .     Typical meal intake: Breakfast is old-fashioned oatmeal, lunch is usually a sandwich.  He has mostly popcorn for snacks   Dinnertime is usually 6-7  Dietician visit, most recent: none  CDE consultation: 7/16               Exercise: walking at Rehab   Weight history: 232  Wt Readings from Last 3 Encounters:  07/24/15 247 lb 9.6 oz (112.311 kg)  06/21/15 245 lb 13 oz (111.5 kg)  06/19/15 245 lb 14.4 oz (111.54 kg)    Glycemic control:   Lab Results  Component Value Date   HGBA1C 6.8* 07/20/2015   HGBA1C 8.0* 04/05/2015   HGBA1C 9.4* 12/20/2014   Lab Results  Component Value Date   MICROALBUR 65.6* 06/05/2015   LDLCALC 79 06/05/2015   CREATININE 2.51* 07/20/2015   Microalbumin/creatinine ratio was 378 done on 12/20/14      Medication List       This list is accurate as of: 07/24/15 11:10 AM.  Always use your most recent med list.               aspirin 81 MG tablet  Take 81 mg by mouth daily.     atorvastatin 10 MG tablet  Commonly  known as:  LIPITOR  Take 10 mg by mouth daily.     brimonidine 0.15 % ophthalmic solution  Commonly known as:  ALPHAGAN  Place 1 drop into both eyes 2 (two) times daily.     carvedilol 12.5 MG tablet  Commonly known as:  COREG  Take 12.5 mg by mouth 2 (two) times daily with a meal.     cholecalciferol 1000 units tablet  Commonly known as:  VITAMIN D  Take 2,000 Units by mouth daily.     diphenhydramine-acetaminophen 25-500 MG Tabs tablet  Commonly known as:  TYLENOL PM  Take 2 tablets by mouth at bedtime.     glucose blood test strip  Commonly known as:  BAYER CONTOUR NEXT TEST  Use as instructed to check blood sugar 2 times per day dx code E11.65     hydrALAZINE 50 MG tablet  Commonly known as:  APRESOLINE  Take 50 mg by mouth 3 (three) times daily.     insulin NPH Human 100 UNIT/ML injection  Commonly known as:  HUMULIN N,NOVOLIN N  Inject 20-26 Units into the skin 2 (two) times daily. Inject 15 units subque in the morning and 26 units subque in the evening before bed     isosorbide mononitrate 120 MG 24 hr tablet  Commonly known as:  IMDUR  Take 120 mg by mouth daily.     multivitamin with minerals Tabs tablet  Take 1 tablet by mouth daily.     NOVOLIN R 100 units/mL injection  Generic drug:  insulin regular  INJECT 10 UNITS EVERY MORNING BEFORE BREAKFAST, 5 UNITS BEFORE LUNCH, AND 10 UNITS BEFORE SUPPER     prasugrel 10 MG Tabs tablet  Commonly known as:  EFFIENT  Take 1 tablet (10 mg total) by mouth daily.     tamsulosin 0.4 MG Caps capsule  Commonly known as:  FLOMAX  Take 0.4 mg by mouth daily after supper.     Travoprost (BAK Free) 0.004 % Soln ophthalmic solution  Commonly known as:  TRAVATAN  Place 1 drop into both eyes at bedtime.     triamcinolone cream 0.1 %  Commonly known as:  KENALOG  Apply 1 application topically daily.     vitamin A 7500 UNIT capsule  Take 7,500 Units by mouth daily.     vitamin C 1000 MG tablet  Take 1,000 mg by mouth  daily.        Allergies: No Known Allergies  Past Medical History  Diagnosis Date  . Enlarged prostate   . Hypertension   . Anemia   . Coronary artery disease   . Diabetes mellitus     insulin dependent   . Chronic kidney disease   . Arthritis     Past Surgical History  Procedure Laterality Date  . Shoulder surgery    . Cardiac catheterization N/A 05/01/2015    Procedure: Left Heart Cath and Coronary Angiography;  Surgeon: Adrian Prows, MD;  Location: [place] [facility];  Service: Cardiovascular;  Laterality: N/A;  . Cardiac catheterization  05/01/2015    Procedure: Intravascular Pressure Wire/FFR Study;  Surgeon: Adrian Prows, MD;  Location: [place] [facility];  Service: Cardiovascular;;  . Cardiac catheterization N/A 05/22/2015    Procedure: Left Heart Cath and Coronary Angiography;  Surgeon: Adrian Prows, MD;  Location: [place] [facility];  Service: Cardiovascular;  Laterality: N/A;  . Cardiac catheterization N/A 05/22/2015    Procedure: Coronary Stent Intervention;  Surgeon: Adrian Prows, MD;  Location: [place] [facility];  Service: Cardiovascular;  Laterality: N/A;    Family History  Problem Relation Age of Onset  . Hypertension Mother   . Heart disease Sister   . Stroke Sister   . Diabetes Brother     Social History:  reports that he has quit smoking. He has never used smokeless tobacco. He reports that he does not drink alcohol or use illicit drugs.    Review of Systems   THYROID nodule: He has a dominant left-sided thyroid nodule and did have a biopsy done Results as follows: BENIGN FOLLICULAR CELLS, COLLOID AND MACROPHAGES, CONSISTENT WITH  BENIGN GOITROUS NODULE   Lipid history: Has been on medication for a few years with Lipitor   Lab Results  Component Value Date   CHOL 137 06/05/2015   HDL 39.60 06/05/2015   LDLCALC 79 06/05/2015   TRIG 91.0 06/05/2015   CHOLHDL 3 06/05/2015          Eyes: Marland Kitchen   Most recent eye exam was in 08/2014   Has no numbness,  burning, pains or tingling in feet   Diabetic foot exam done in 7/16  RENAL dysfunction: He is followed by nephrologist    Lab Results  Component Value Date   CREATININE 2.51* 07/20/2015   CREATININE 2.7* 06/19/2015   CREATININE 2.27* 05/24/2015   HYPERTENSION: This is followed by cardiologist and nephrologist as well as PCP, blood pressure higher today  Physical Examination:  BP 150/86 mmHg  Pulse 68  Temp(Src) 97.8 F (36.6 C)  Resp 14  Ht 6\' 3"  (1.905 m)  Wt 247 lb 9.6 oz (112.311 kg)  BMI 30.95 kg/m2  SpO2 98%    No edema present    ASSESSMENT:  Diabetes type 2, uncontrolled    See history of present illness for detailed discussion of his current management, blood sugar patterns and problems identified  Currently on NPH twice a day and Regular Insulin at breakfast and supper Appears to be needing most of his insulin at bedtime with the dose of 26 units and about half the amount in the morning. His blood sugars are overall fairly well controlled but do fluctuate especially fasting readings Has not had any significant hypoglycemia in the last week or so but blood sugars are still the lowest midday Discussed need to add protein to breakfast or have a midmorning snack to help even out  blood sugars during the day  His blood sugar fluctuation may be partly related to taking NPH and Generic regular insulin  HYPERTENSION: Needs follow-up with cardiologist  PLAN:   He may mix the insulin in the morning and reduce NPH by 1 unit at least  Midmorning snack  Increase suppertime coverage by at least 1 unit and check more readings after supper  Patient Instructions  May mix insulin in am using 6 regular and 14 N insulin total 20  6-7 Regular before supper  If oatmeal then may need a snack mid am        [facility] 07/24/2015, 11:10 AM   Note: This office note was prepared with Estate agent. Any transcriptional errors that result from  this process are unintentional.

## 2015-07-25 ENCOUNTER — Encounter (HOSPITAL_COMMUNITY)
Admission: RE | Admit: 2015-07-25 | Discharge: 2015-07-25 | Disposition: A | Discharge status: Home / Self Care | Payer: Medicare Other | Source: Ambulatory Visit | Attending: Cardiology | Admitting: Cardiology

## 2015-07-25 DIAGNOSIS — Z955 Presence of coronary angioplasty implant and graft: Secondary | ICD-10-CM | POA: Diagnosis not present

## 2015-07-27 ENCOUNTER — Encounter (HOSPITAL_COMMUNITY)
Admission: RE | Admit: 2015-07-27 | Discharge: 2015-07-27 | Disposition: A | Discharge status: Home / Self Care | Payer: Medicare Other | Source: Ambulatory Visit | Attending: Cardiology | Admitting: Cardiology

## 2015-07-27 DIAGNOSIS — Z955 Presence of coronary angioplasty implant and graft: Secondary | ICD-10-CM | POA: Diagnosis not present

## 2015-07-30 ENCOUNTER — Encounter (HOSPITAL_COMMUNITY): Admission: RE | Admit: 2015-07-30 | Payer: Medicare Other | Source: Ambulatory Visit

## 2015-07-31 DIAGNOSIS — I10 Essential (primary) hypertension: Secondary | ICD-10-CM | POA: Diagnosis not present

## 2015-07-31 DIAGNOSIS — C9110 Chronic lymphocytic leukemia of B-cell type not having achieved remission: Secondary | ICD-10-CM | POA: Diagnosis not present

## 2015-07-31 DIAGNOSIS — N183 Chronic kidney disease, stage 3 (moderate): Secondary | ICD-10-CM | POA: Diagnosis not present

## 2015-07-31 DIAGNOSIS — E8770 Fluid overload, unspecified: Secondary | ICD-10-CM | POA: Diagnosis not present

## 2015-08-01 ENCOUNTER — Encounter (HOSPITAL_COMMUNITY)
Admission: RE | Admit: 2015-08-01 | Discharge: 2015-08-01 | Disposition: A | Discharge status: Home / Self Care | Payer: Medicare Other | Source: Ambulatory Visit | Attending: Cardiology | Admitting: Cardiology

## 2015-08-01 DIAGNOSIS — Z955 Presence of coronary angioplasty implant and graft: Secondary | ICD-10-CM | POA: Diagnosis not present

## 2015-08-03 ENCOUNTER — Encounter (HOSPITAL_COMMUNITY)
Admission: RE | Admit: 2015-08-03 | Discharge: 2015-08-03 | Disposition: A | Discharge status: Home / Self Care | Payer: Medicare Other | Source: Ambulatory Visit | Attending: Cardiology | Admitting: Cardiology

## 2015-08-03 DIAGNOSIS — Z955 Presence of coronary angioplasty implant and graft: Secondary | ICD-10-CM | POA: Diagnosis not present

## 2015-08-06 ENCOUNTER — Encounter (HOSPITAL_COMMUNITY): Admission: RE | Admit: 2015-08-06 | Payer: Medicare Other | Source: Ambulatory Visit

## 2015-08-08 ENCOUNTER — Encounter (HOSPITAL_COMMUNITY)
Admission: RE | Admit: 2015-08-08 | Discharge: 2015-08-08 | Disposition: A | Discharge status: Home / Self Care | Payer: Medicare Other | Source: Ambulatory Visit | Attending: Cardiology | Admitting: Cardiology

## 2015-08-08 DIAGNOSIS — Z955 Presence of coronary angioplasty implant and graft: Secondary | ICD-10-CM | POA: Diagnosis not present

## 2015-08-10 ENCOUNTER — Encounter (HOSPITAL_COMMUNITY)
Admission: RE | Admit: 2015-08-10 | Discharge: 2015-08-10 | Disposition: A | Discharge status: Home / Self Care | Payer: Medicare Other | Source: Ambulatory Visit | Attending: Cardiology | Admitting: Cardiology

## 2015-08-10 DIAGNOSIS — Z955 Presence of coronary angioplasty implant and graft: Secondary | ICD-10-CM | POA: Diagnosis not present

## 2015-08-13 ENCOUNTER — Encounter (HOSPITAL_COMMUNITY)
Admission: RE | Admit: 2015-08-13 | Discharge: 2015-08-13 | Disposition: A | Discharge status: Home / Self Care | Payer: Medicare Other | Source: Ambulatory Visit | Attending: Cardiology | Admitting: Cardiology

## 2015-08-13 DIAGNOSIS — Z955 Presence of coronary angioplasty implant and graft: Secondary | ICD-10-CM | POA: Diagnosis not present

## 2015-08-15 ENCOUNTER — Encounter (HOSPITAL_COMMUNITY)
Admission: RE | Admit: 2015-08-15 | Discharge: 2015-08-15 | Disposition: A | Discharge status: Home / Self Care | Payer: Medicare Other | Source: Ambulatory Visit | Attending: Cardiology | Admitting: Cardiology

## 2015-08-15 DIAGNOSIS — Z955 Presence of coronary angioplasty implant and graft: Secondary | ICD-10-CM | POA: Diagnosis not present

## 2015-08-17 ENCOUNTER — Encounter (HOSPITAL_COMMUNITY)
Admission: RE | Admit: 2015-08-17 | Discharge: 2015-08-17 | Disposition: A | Discharge status: Home / Self Care | Payer: Medicare Other | Source: Ambulatory Visit | Attending: Cardiology | Admitting: Cardiology

## 2015-08-17 DIAGNOSIS — Z955 Presence of coronary angioplasty implant and graft: Secondary | ICD-10-CM | POA: Diagnosis not present

## 2015-08-20 ENCOUNTER — Encounter (HOSPITAL_COMMUNITY)
Admission: RE | Admit: 2015-08-20 | Discharge: 2015-08-20 | Disposition: A | Discharge status: Home / Self Care | Payer: Medicare Other | Source: Ambulatory Visit | Attending: Cardiology | Admitting: Cardiology

## 2015-08-20 DIAGNOSIS — Z955 Presence of coronary angioplasty implant and graft: Secondary | ICD-10-CM | POA: Diagnosis not present

## 2015-08-22 ENCOUNTER — Encounter (HOSPITAL_COMMUNITY): Payer: Medicare Other

## 2015-08-24 ENCOUNTER — Encounter (HOSPITAL_COMMUNITY)
Admission: RE | Admit: 2015-08-24 | Discharge: 2015-08-24 | Disposition: A | Discharge status: Home / Self Care | Payer: Medicare Other | Source: Ambulatory Visit | Attending: Cardiology | Admitting: Cardiology

## 2015-08-24 DIAGNOSIS — Z955 Presence of coronary angioplasty implant and graft: Secondary | ICD-10-CM | POA: Diagnosis not present

## 2015-08-27 ENCOUNTER — Encounter (HOSPITAL_COMMUNITY)
Admission: RE | Admit: 2015-08-27 | Discharge: 2015-08-27 | Disposition: A | Discharge status: Home / Self Care | Payer: Medicare Other | Source: Ambulatory Visit | Attending: Cardiology | Admitting: Cardiology

## 2015-08-27 DIAGNOSIS — Z955 Presence of coronary angioplasty implant and graft: Secondary | ICD-10-CM | POA: Diagnosis not present

## 2015-08-29 ENCOUNTER — Encounter (HOSPITAL_COMMUNITY)
Admission: RE | Admit: 2015-08-29 | Discharge: 2015-08-29 | Disposition: A | Discharge status: Home / Self Care | Payer: Medicare Other | Source: Ambulatory Visit | Attending: Cardiology | Admitting: Cardiology

## 2015-08-29 DIAGNOSIS — Z955 Presence of coronary angioplasty implant and graft: Secondary | ICD-10-CM | POA: Diagnosis not present

## 2015-08-31 ENCOUNTER — Encounter (HOSPITAL_COMMUNITY)
Admission: RE | Admit: 2015-08-31 | Discharge: 2015-08-31 | Disposition: A | Discharge status: Home / Self Care | Payer: Medicare Other | Source: Ambulatory Visit | Attending: Cardiology | Admitting: Cardiology

## 2015-08-31 DIAGNOSIS — Z955 Presence of coronary angioplasty implant and graft: Secondary | ICD-10-CM | POA: Diagnosis not present

## 2015-09-03 ENCOUNTER — Encounter (HOSPITAL_COMMUNITY)
Admission: RE | Admit: 2015-09-03 | Discharge: 2015-09-03 | Disposition: A | Discharge status: Home / Self Care | Payer: Medicare Other | Source: Ambulatory Visit | Attending: Cardiology | Admitting: Cardiology

## 2015-09-03 DIAGNOSIS — Z955 Presence of coronary angioplasty implant and graft: Secondary | ICD-10-CM | POA: Diagnosis not present

## 2015-09-05 ENCOUNTER — Encounter (HOSPITAL_COMMUNITY)
Admission: RE | Admit: 2015-09-05 | Discharge: 2015-09-05 | Disposition: A | Discharge status: Home / Self Care | Payer: Medicare Other | Source: Ambulatory Visit | Attending: Cardiology | Admitting: Cardiology

## 2015-09-05 DIAGNOSIS — Z955 Presence of coronary angioplasty implant and graft: Secondary | ICD-10-CM | POA: Diagnosis not present

## 2015-09-07 ENCOUNTER — Encounter (HOSPITAL_COMMUNITY)
Admission: RE | Admit: 2015-09-07 | Discharge: 2015-09-07 | Disposition: A | Discharge status: Home / Self Care | Payer: Medicare Other | Source: Ambulatory Visit | Attending: Cardiology | Admitting: Cardiology

## 2015-09-07 DIAGNOSIS — Z955 Presence of coronary angioplasty implant and graft: Secondary | ICD-10-CM | POA: Diagnosis not present

## 2015-09-10 ENCOUNTER — Encounter (HOSPITAL_COMMUNITY)
Admission: RE | Admit: 2015-09-10 | Discharge: 2015-09-10 | Disposition: A | Discharge status: Home / Self Care | Payer: Medicare Other | Source: Ambulatory Visit | Attending: Cardiology | Admitting: Cardiology

## 2015-09-10 DIAGNOSIS — R972 Elevated prostate specific antigen [PSA]: Secondary | ICD-10-CM | POA: Diagnosis not present

## 2015-09-10 DIAGNOSIS — N401 Enlarged prostate with lower urinary tract symptoms: Secondary | ICD-10-CM | POA: Diagnosis not present

## 2015-09-10 DIAGNOSIS — Z0000 Encounter for general adult medical examination without abnormal findings: Secondary | ICD-10-CM | POA: Diagnosis not present

## 2015-09-10 DIAGNOSIS — R351 Nocturia: Secondary | ICD-10-CM | POA: Diagnosis not present

## 2015-09-10 DIAGNOSIS — Z955 Presence of coronary angioplasty implant and graft: Secondary | ICD-10-CM | POA: Diagnosis not present

## 2015-09-12 ENCOUNTER — Encounter (HOSPITAL_COMMUNITY)
Admission: RE | Admit: 2015-09-12 | Discharge: 2015-09-12 | Disposition: A | Discharge status: Home / Self Care | Payer: Medicare Other | Source: Ambulatory Visit | Attending: Cardiology | Admitting: Cardiology

## 2015-09-12 DIAGNOSIS — Z955 Presence of coronary angioplasty implant and graft: Secondary | ICD-10-CM | POA: Diagnosis not present

## 2015-09-14 ENCOUNTER — Encounter (HOSPITAL_COMMUNITY)
Admission: RE | Admit: 2015-09-14 | Discharge: 2015-09-14 | Disposition: A | Discharge status: Home / Self Care | Payer: Medicare Other | Source: Ambulatory Visit | Attending: Cardiology | Admitting: Cardiology

## 2015-09-14 DIAGNOSIS — Z955 Presence of coronary angioplasty implant and graft: Secondary | ICD-10-CM | POA: Diagnosis not present

## 2015-09-17 ENCOUNTER — Encounter (HOSPITAL_COMMUNITY)
Admission: RE | Admit: 2015-09-17 | Discharge: 2015-09-17 | Disposition: A | Discharge status: Home / Self Care | Payer: Medicare Other | Source: Ambulatory Visit | Attending: Cardiology | Admitting: Cardiology

## 2015-09-17 DIAGNOSIS — Z955 Presence of coronary angioplasty implant and graft: Secondary | ICD-10-CM | POA: Diagnosis not present

## 2015-09-19 ENCOUNTER — Encounter (HOSPITAL_COMMUNITY)
Admission: RE | Admit: 2015-09-19 | Discharge: 2015-09-19 | Disposition: A | Discharge status: Home / Self Care | Payer: Medicare Other | Source: Ambulatory Visit | Attending: Cardiology | Admitting: Cardiology

## 2015-09-19 DIAGNOSIS — Z955 Presence of coronary angioplasty implant and graft: Secondary | ICD-10-CM | POA: Insufficient documentation

## 2015-09-21 ENCOUNTER — Encounter (HOSPITAL_COMMUNITY)
Admission: RE | Admit: 2015-09-21 | Discharge: 2015-09-21 | Disposition: A | Discharge status: Home / Self Care | Payer: Medicare Other | Source: Ambulatory Visit | Attending: Cardiology | Admitting: Cardiology

## 2015-09-21 DIAGNOSIS — Z955 Presence of coronary angioplasty implant and graft: Secondary | ICD-10-CM | POA: Diagnosis not present

## 2015-09-24 ENCOUNTER — Encounter (HOSPITAL_COMMUNITY)
Admission: RE | Admit: 2015-09-24 | Discharge: 2015-09-24 | Disposition: A | Discharge status: Home / Self Care | Payer: Medicare Other | Source: Ambulatory Visit | Attending: Cardiology | Admitting: Cardiology

## 2015-09-24 DIAGNOSIS — Z955 Presence of coronary angioplasty implant and graft: Secondary | ICD-10-CM | POA: Diagnosis not present

## 2015-09-26 ENCOUNTER — Encounter (HOSPITAL_COMMUNITY)
Admission: RE | Admit: 2015-09-26 | Discharge: 2015-09-26 | Disposition: A | Discharge status: Home / Self Care | Payer: Medicare Other | Source: Ambulatory Visit | Attending: Cardiology | Admitting: Cardiology

## 2015-09-26 DIAGNOSIS — Z955 Presence of coronary angioplasty implant and graft: Secondary | ICD-10-CM | POA: Diagnosis not present

## 2015-09-28 ENCOUNTER — Encounter (HOSPITAL_COMMUNITY)
Admission: RE | Admit: 2015-09-28 | Discharge: 2015-09-28 | Disposition: A | Discharge status: Home / Self Care | Payer: Medicare Other | Source: Ambulatory Visit | Attending: Cardiology | Admitting: Cardiology

## 2015-09-28 DIAGNOSIS — Z955 Presence of coronary angioplasty implant and graft: Secondary | ICD-10-CM | POA: Diagnosis not present

## 2015-09-28 NOTE — Progress Notes (Signed)
Pt graduated from cardiac rehab program today with completion of 36 exercise sessions in Phase II. Pt maintained good attendance to exercise.  Pt progressed nicely during his participation in rehab as evidenced by increased MET level. Pt MET level increased from 3.1 to 4.1.  Pt advised to advocate to his medical doctors the need for improvement of his blood pressure.  Explained to pt the importance of optimal blood pressure management due to pt risk factors.  Pt receptive of this and has a follow up appt next week.   Medication list reconciled. Repeat  PHQ score- 0 .  Pt had slightly less on his post quality of life survey.      Quality of Life - 09/28/15 0724    Quality of Life Scores   Health/Function Pre 29 %   Health/Function Post 24.63 %   Socioeconomic Pre 26.43 %   Socioeconomic Post 30 %   Psych/Spiritual Pre 30 %   Psych/Spiritual Post 28.8 %   Family Pre 27.5 %   Family Post 28.8 %   GLOBAL Pre 28.41 %   GLOBAL Post 27.06 %      Pt attributes this to the aging process and many of the things he was not satisfied with are not a real bother to him. Pt does worry about being around for his grandchildren that he cares for.  Pt realizes that he is getting older and will not have as many years late. Pt has made significant lifestyle changes most notable with food choices and portion sizes. Although pt did not achieve the desired weight loss he attributes this to his overall fluid status. Pt has avoided sodium since he was diagnosis with diabetes.  and should be commended for his success. Pt feels he has achieved his short term goal during cardiac rehab to breath better.  Pt feels his breathing has improved. Pt able to do activities with ease.  Pt observed interacting with other participants. Pt plans to continue exercise with walking 6 days a week.  It was a delight to have this patient in the rehab program. Maurice Small RN, BSN

## 2015-10-03 DIAGNOSIS — E669 Obesity, unspecified: Secondary | ICD-10-CM | POA: Diagnosis not present

## 2015-10-03 DIAGNOSIS — C9000 Multiple myeloma not having achieved remission: Secondary | ICD-10-CM | POA: Diagnosis not present

## 2015-10-03 DIAGNOSIS — I2510 Atherosclerotic heart disease of native coronary artery without angina pectoris: Secondary | ICD-10-CM | POA: Diagnosis not present

## 2015-10-03 DIAGNOSIS — Z794 Long term (current) use of insulin: Secondary | ICD-10-CM | POA: Diagnosis not present

## 2015-10-03 DIAGNOSIS — E785 Hyperlipidemia, unspecified: Secondary | ICD-10-CM | POA: Diagnosis not present

## 2015-10-03 DIAGNOSIS — I10 Essential (primary) hypertension: Secondary | ICD-10-CM | POA: Diagnosis not present

## 2015-10-03 DIAGNOSIS — D638 Anemia in other chronic diseases classified elsewhere: Secondary | ICD-10-CM | POA: Diagnosis not present

## 2015-10-03 DIAGNOSIS — N183 Chronic kidney disease, stage 3 (moderate): Secondary | ICD-10-CM | POA: Diagnosis not present

## 2015-10-03 DIAGNOSIS — E1122 Type 2 diabetes mellitus with diabetic chronic kidney disease: Secondary | ICD-10-CM | POA: Diagnosis not present

## 2015-10-05 ENCOUNTER — Other Ambulatory Visit: Payer: Self-pay | Admitting: *Deleted

## 2015-10-05 DIAGNOSIS — N183 Chronic kidney disease, stage 3 (moderate): Secondary | ICD-10-CM

## 2015-10-05 DIAGNOSIS — D638 Anemia in other chronic diseases classified elsewhere: Secondary | ICD-10-CM

## 2015-10-17 ENCOUNTER — Other Ambulatory Visit (INDEPENDENT_AMBULATORY_CARE_PROVIDER_SITE_OTHER): Payer: Medicare Other

## 2015-10-17 DIAGNOSIS — Z794 Long term (current) use of insulin: Secondary | ICD-10-CM

## 2015-10-17 DIAGNOSIS — H25013 Cortical age-related cataract, bilateral: Secondary | ICD-10-CM | POA: Diagnosis not present

## 2015-10-17 DIAGNOSIS — E1165 Type 2 diabetes mellitus with hyperglycemia: Secondary | ICD-10-CM | POA: Diagnosis not present

## 2015-10-17 DIAGNOSIS — H11153 Pinguecula, bilateral: Secondary | ICD-10-CM | POA: Diagnosis not present

## 2015-10-17 DIAGNOSIS — N183 Chronic kidney disease, stage 3 (moderate): Secondary | ICD-10-CM

## 2015-10-17 DIAGNOSIS — D638 Anemia in other chronic diseases classified elsewhere: Secondary | ICD-10-CM | POA: Diagnosis not present

## 2015-10-17 DIAGNOSIS — H18413 Arcus senilis, bilateral: Secondary | ICD-10-CM | POA: Diagnosis not present

## 2015-10-17 DIAGNOSIS — H35361 Drusen (degenerative) of macula, right eye: Secondary | ICD-10-CM | POA: Diagnosis not present

## 2015-10-17 DIAGNOSIS — H2513 Age-related nuclear cataract, bilateral: Secondary | ICD-10-CM | POA: Diagnosis not present

## 2015-10-17 DIAGNOSIS — H401131 Primary open-angle glaucoma, bilateral, mild stage: Secondary | ICD-10-CM | POA: Diagnosis not present

## 2015-10-17 DIAGNOSIS — E119 Type 2 diabetes mellitus without complications: Secondary | ICD-10-CM | POA: Diagnosis not present

## 2015-10-17 DIAGNOSIS — Z7984 Long term (current) use of oral hypoglycemic drugs: Secondary | ICD-10-CM | POA: Diagnosis not present

## 2015-10-17 LAB — COMPREHENSIVE METABOLIC PANEL
ALK PHOS: 59 U/L (ref 39–117)
ALT: 24 U/L (ref 0–53)
AST: 20 U/L (ref 0–37)
Albumin: 4.1 g/dL (ref 3.5–5.2)
BILIRUBIN TOTAL: 0.3 mg/dL (ref 0.2–1.2)
BUN: 60 mg/dL — AB (ref 6–23)
CO2: 23 mEq/L (ref 19–32)
Calcium: 9.7 mg/dL (ref 8.4–10.5)
Chloride: 103 mEq/L (ref 96–112)
Creatinine, Ser: 2.91 mg/dL — ABNORMAL HIGH (ref 0.40–1.50)
GFR: 22.4 mL/min — ABNORMAL LOW (ref >60.00)
GLUCOSE: 185 mg/dL — AB (ref 70–99)
Potassium: 4.5 mEq/L (ref 3.5–5.1)
SODIUM: 134 meq/L — AB (ref 135–145)
TOTAL PROTEIN: 6.9 g/dL (ref 6.0–8.3)

## 2015-10-17 LAB — HEMOGLOBIN A1C: HEMOGLOBIN A1C: 7.6 % — AB (ref 4.6–6.5)

## 2015-10-17 LAB — CBC
HCT: 33.1 % — ABNORMAL LOW (ref 39.0–52.0)
HEMOGLOBIN: 10.9 g/dL — AB (ref 13.0–17.0)
MCHC: 33.1 g/dL (ref 30.0–36.0)
MCV: 87.1 fl (ref 78.0–100.0)
Platelets: 280 10*3/uL (ref 150.0–400.0)
RBC: 3.8 Mil/uL — ABNORMAL LOW (ref 4.22–5.81)
RDW: 14.8 % (ref 11.5–15.5)
WBC: 12.1 10*3/uL — ABNORMAL HIGH (ref 4.0–10.5)

## 2015-10-22 ENCOUNTER — Ambulatory Visit (INDEPENDENT_AMBULATORY_CARE_PROVIDER_SITE_OTHER): Payer: Medicare Other | Admitting: Endocrinology

## 2015-10-22 ENCOUNTER — Encounter: Payer: Self-pay | Admitting: Endocrinology

## 2015-10-22 VITALS — BP 148/74 | HR 62 | Temp 97.6°F | Resp 16 | Ht 75.0 in | Wt 243.2 lb

## 2015-10-22 DIAGNOSIS — Z794 Long term (current) use of insulin: Secondary | ICD-10-CM | POA: Diagnosis not present

## 2015-10-22 DIAGNOSIS — E1165 Type 2 diabetes mellitus with hyperglycemia: Secondary | ICD-10-CM | POA: Diagnosis not present

## 2015-10-22 NOTE — Patient Instructions (Signed)
Check blood sugars on waking up 3  times a week  Also check blood sugars about 2 hours after a meal and do this after different meals by rotation  Recommended blood sugar levels on waking up is 90-130 and about 2 hours after meal is 130-160  Please bring your blood sugar monitor to each visit, thank you  

## 2015-10-22 NOTE — Progress Notes (Signed)
Patient ID: Ray Merritt, male   DOB: [birth year], 80 y.o.   MRN: [id number]           Reason for Appointment: Follow-up for Type 2 Diabetes  Referring physician: Fulp  History of Present Illness:          Date of diagnosis of type 2 diabetes mellitus : 1982        Background history:   He has been on insulin since the time of diagnosis, initially his blood sugars were significantly high ; he was symptomatic with dry mouth and blurred vision. He thinks he was probably taking NPH and Regular Insulin for quite some time and before his consultation was only on NPH 3 years ago he was put on Bydureon weekly in addition to NPH.  He thinks that it initially curb his appetite but he does not think he lost any significant amount of weight.  Also he does not think his blood sugar control was much better For about a year he had been switched from Bydureon to Tanzeum weekly  His  Tanzeum was stopped because of lack of benefit A1c has been as high as 11.8 in 2/16  Recent history:   INSULIN regimen is : Novolin NPH 15 units acb and 26 units bedtime, Regular Insulin 6 units at breakfast and 7 supper   On his initial consultation because of high postprandial readings he was started on Regular Insulin before each meal He was also seen by nurse educator for basic diabetes education   A1c is relatively higher at 7.6, previously 6.8  Current blood sugar patterns and problems identified:  Not clear why his A1c is higher now  He did not bring his monitor for download today  He thinks his blood sugars are all fairly good and rarely higher at times  He did have a high reading of 225 this morning at home because of eating ice cream last night Lab glucose was 185 PC breakfast but probably an hour later  He is not having any hypoglycemia now and has had only 2 episodes when he was busy outside working and forgot to get in for lunch. Otherwise he thinks his blood sugars are fairly good at lunch similar  to what they are in the morning  He rarely has high readings in the mornings  He thinks blood sugars are better after supper with increasing regular insulin by 2 units on the last visit  Has difficulty losing weight and this is despite trying to be active  Other hypoglycemic drugs the patient is taking are:   none Side effects from medications have been: None  Compliance with the medical regimen: Good Hypoglycemia: with activity symptoms are weakness and feeling sweaty, treated with juice or other sweet drinks, usually can recognize blood sugars in the low 60s   Glucose monitoring:  done 3 times a day         Glucometer:  Contour      Blood Glucose readings by recall No download today:  Mean values apply above for all meters except median for One Touch  PRE-MEAL Fasting Lunch Dinner Bedtime Overall  Glucose range: 100-160 100-140 90 140   Mean/median:        Self-care: The diet that the patient has been following is: tries to limit portions .     Typical meal intake: Breakfast is old-fashioned oatmeal, lunch is usually a sandwich.  He has mostly popcorn for snacks   Dinner time is usually 6-7  Dietician visit, most recent: none  CDE consultation: 7/16               Exercise: walking or gardening     Weight history:  Wt Readings from Last 3 Encounters:  10/22/15 243 lb 3.2 oz (110.315 kg)  07/24/15 247 lb 9.6 oz (112.311 kg)  06/21/15 245 lb 13 oz (111.5 kg)    Glycemic control:   Lab Results  Component Value Date   HGBA1C 7.6* 10/17/2015   HGBA1C 6.8* 07/20/2015   HGBA1C 8.0* 04/05/2015   Lab Results  Component Value Date   MICROALBUR 65.6* 06/05/2015   LDLCALC 79 06/05/2015   CREATININE 2.91* 10/17/2015   Microalbumin/creatinine ratio was 378 done on 12/20/14      Medication List       This list is accurate as of: 10/22/15 10:55 AM.  Always use your most recent med list.               aspirin 81 MG tablet  Take 81 mg by mouth daily.      atorvastatin 10 MG tablet  Commonly known as:  LIPITOR  Take 10 mg by mouth daily.     brimonidine 0.15 % ophthalmic solution  Commonly known as:  ALPHAGAN  Place 1 drop into both eyes 2 (two) times daily.     carvedilol 12.5 MG tablet  Commonly known as:  COREG  Take 12.5 mg by mouth 2 (two) times daily with a meal.     cholecalciferol 1000 units tablet  Commonly known as:  VITAMIN D  Take 2,000 Units by mouth daily.     diphenhydramine-acetaminophen 25-500 MG Tabs tablet  Commonly known as:  TYLENOL PM  Take 2 tablets by mouth at bedtime.     glucose blood test strip  Commonly known as:  BAYER CONTOUR NEXT TEST  Use as instructed to check blood sugar 2 times per day dx code E11.65     hydrALAZINE 50 MG tablet  Commonly known as:  APRESOLINE  Take 50 mg by mouth 3 (three) times daily.     insulin NPH Human 100 UNIT/ML injection  Commonly known as:  HUMULIN N,NOVOLIN N  Inject into the skin 2 (two) times daily. Inject 15 units subque in the morning and 26 units subque in the evening before bed     isosorbide mononitrate 120 MG 24 hr tablet  Commonly known as:  IMDUR  Take 120 mg by mouth daily.     multivitamin with minerals Tabs tablet  Take 1 tablet by mouth daily.     NOVOLIN R 100 units/mL injection  Generic drug:  insulin regular  INJECT 10 UNITS EVERY MORNING BEFORE BREAKFAST, 5 UNITS BEFORE LUNCH, AND 10 UNITS BEFORE SUPPER     prasugrel 10 MG Tabs tablet  Commonly known as:  EFFIENT  Take 1 tablet (10 mg total) by mouth daily.     tamsulosin 0.4 MG Caps capsule  Commonly known as:  FLOMAX  Take 0.4 mg by mouth daily after supper.     Travoprost (BAK Free) 0.004 % Soln ophthalmic solution  Commonly known as:  TRAVATAN  Place 1 drop into both eyes at bedtime.     triamcinolone cream 0.1 %  Commonly known as:  KENALOG  Apply 1 application topically daily.     vitamin A 7500 UNIT capsule  Take 7,500 Units by mouth daily.     vitamin C 1000 MG tablet    Take 1,000 mg by mouth  daily.        Allergies: No Known Allergies  Past Medical History  Diagnosis Date  . Enlarged prostate   . Hypertension   . Anemia   . Coronary artery disease   . Diabetes mellitus     insulin dependent   . Chronic kidney disease   . Arthritis     Past Surgical History  Procedure Laterality Date  . Shoulder surgery    . Cardiac catheterization N/A 05/01/2015    Procedure: Left Heart Cath and Coronary Angiography;  Surgeon: Adrian Prows, MD;  Location: [place] [facility];  Service: Cardiovascular;  Laterality: N/A;  . Cardiac catheterization  05/01/2015    Procedure: Intravascular Pressure Wire/FFR Study;  Surgeon: Adrian Prows, MD;  Location: [place] [facility];  Service: Cardiovascular;;  . Cardiac catheterization N/A 05/22/2015    Procedure: Left Heart Cath and Coronary Angiography;  Surgeon: Adrian Prows, MD;  Location: [place] [facility];  Service: Cardiovascular;  Laterality: N/A;  . Cardiac catheterization N/A 05/22/2015    Procedure: Coronary Stent Intervention;  Surgeon: Adrian Prows, MD;  Location: [place] [facility];  Service: Cardiovascular;  Laterality: N/A;    Family History  Problem Relation Age of Onset  . Hypertension Mother   . Heart disease Sister   . Stroke Sister   . Diabetes Brother     Social History:  reports that he has quit smoking. He has never used smokeless tobacco. He reports that he does not drink alcohol or use illicit drugs.    Review of Systems   THYROID nodule: He has a dominant left-sided thyroid nodule and did have a biopsy done Results as follows: BENIGN FOLLICULAR CELLS, COLLOID AND MACROPHAGES, CONSISTENT WITH  BENIGN GOITROUS NODULE   Lipid history: Has been on medication for a few years with Lipitor   Lab Results  Component Value Date   CHOL 137 06/05/2015   HDL 39.60 06/05/2015   LDLCALC 79 06/05/2015   TRIG 91.0 06/05/2015   CHOLHDL 3 06/05/2015          Eyes: Marland Kitchen  Most recent eye exam was in  3/17  Diabetic foot exam done in 7/16  RENAL dysfunction: He is followed by nephrologist And the level is variable   Lab Results  Component Value Date   CREATININE 2.91* 10/17/2015   CREATININE 2.51* 07/20/2015   CREATININE 2.7* 06/19/2015   HYPERTENSION: This is followed by cardiologist and nephrologist as well as PCP, blood pressure higher today  Physical Examination:  BP 148/74 mmHg  Pulse 62  Temp(Src) 97.6 F (36.4 C)  Resp 16  Ht 6\' 3"  (1.905 m)  Wt 243 lb 3.2 oz (110.315 kg)  BMI 30.40 kg/m2  SpO2 96%    No edema present    ASSESSMENT:  Diabetes type 2, uncontrolled    See history of present illness for detailed discussion of his current management, blood sugar patterns and problems identified  Overall has good control on NPH twice a day and Regular Insulin at breakfast and supper Appears to be needing most of his insulin at bedtime with the dose of 26 units and less basal insulin in the morning Difficult to assess his blood sugar patterns because of lack of monitor download However A1c is higher than before even though there is no change in hypoglycemia level He does not report any consistently high readings although some inability is related to diet including high reading this morning of over 200 at home  His blood sugar  fluctuation may be partly related to taking NPH and Generic regular insulin  HYPERTENSION:  follow-up with cardiologist  PLAN:   He may need to reduce his morning regular if blood sugars are getting low midmorning or if his sugars are low at lunchtime he will reduce morning NPH  Otherwise he needs to do checking blood sugars at various times including after meals and call if not well controlled    Patient Instructions  Check blood sugars on waking up 3  times a week Also check blood sugars about 2 hours after a meal and do this after different meals by rotation  Recommended blood sugar levels on waking up is 90-130 and about 2 hours  after meal is 130-160  Please bring your blood sugar monitor to each visit, thank you       [facility] 10/22/2015, 10:55 AM   Note: This office note was prepared with Dragon voice recognition system technology. Any transcriptional errors that result from this process are unintentional.

## 2015-11-11 ENCOUNTER — Other Ambulatory Visit: Payer: Self-pay | Admitting: Endocrinology

## 2015-11-26 DIAGNOSIS — E8770 Fluid overload, unspecified: Secondary | ICD-10-CM | POA: Diagnosis not present

## 2015-11-26 DIAGNOSIS — I10 Essential (primary) hypertension: Secondary | ICD-10-CM | POA: Diagnosis not present

## 2015-11-26 DIAGNOSIS — N183 Chronic kidney disease, stage 3 (moderate): Secondary | ICD-10-CM | POA: Diagnosis not present

## 2015-11-26 DIAGNOSIS — C9110 Chronic lymphocytic leukemia of B-cell type not having achieved remission: Secondary | ICD-10-CM | POA: Diagnosis not present

## 2015-11-29 DIAGNOSIS — R972 Elevated prostate specific antigen [PSA]: Secondary | ICD-10-CM | POA: Diagnosis not present

## 2015-12-06 DIAGNOSIS — Z0000 Encounter for general adult medical examination without abnormal findings: Secondary | ICD-10-CM | POA: Diagnosis not present

## 2015-12-06 DIAGNOSIS — R338 Other retention of urine: Secondary | ICD-10-CM | POA: Diagnosis not present

## 2015-12-06 DIAGNOSIS — R972 Elevated prostate specific antigen [PSA]: Secondary | ICD-10-CM | POA: Diagnosis not present

## 2015-12-06 DIAGNOSIS — R351 Nocturia: Secondary | ICD-10-CM | POA: Diagnosis not present

## 2015-12-06 DIAGNOSIS — N401 Enlarged prostate with lower urinary tract symptoms: Secondary | ICD-10-CM | POA: Diagnosis not present

## 2015-12-10 ENCOUNTER — Inpatient Hospital Stay (HOSPITAL_COMMUNITY)
Admission: EM | Admit: 2015-12-10 | Discharge: 2015-12-13 | DRG: 872 | Disposition: A | Discharge status: Home / Self Care | Payer: Medicare Other | Attending: Internal Medicine | Admitting: Internal Medicine

## 2015-12-10 ENCOUNTER — Encounter (HOSPITAL_COMMUNITY): Payer: Self-pay

## 2015-12-10 ENCOUNTER — Emergency Department (HOSPITAL_COMMUNITY): Payer: Medicare Other

## 2015-12-10 DIAGNOSIS — A419 Sepsis, unspecified organism: Secondary | ICD-10-CM | POA: Diagnosis not present

## 2015-12-10 DIAGNOSIS — IMO0002 Reserved for concepts with insufficient information to code with codable children: Secondary | ICD-10-CM | POA: Diagnosis present

## 2015-12-10 DIAGNOSIS — N183 Chronic kidney disease, stage 3 (moderate): Secondary | ICD-10-CM

## 2015-12-10 DIAGNOSIS — R251 Tremor, unspecified: Secondary | ICD-10-CM | POA: Diagnosis not present

## 2015-12-10 DIAGNOSIS — N390 Urinary tract infection, site not specified: Secondary | ICD-10-CM | POA: Diagnosis not present

## 2015-12-10 DIAGNOSIS — R531 Weakness: Secondary | ICD-10-CM | POA: Diagnosis not present

## 2015-12-10 DIAGNOSIS — Z7982 Long term (current) use of aspirin: Secondary | ICD-10-CM

## 2015-12-10 DIAGNOSIS — Z8249 Family history of ischemic heart disease and other diseases of the circulatory system: Secondary | ICD-10-CM

## 2015-12-10 DIAGNOSIS — E1122 Type 2 diabetes mellitus with diabetic chronic kidney disease: Secondary | ICD-10-CM | POA: Diagnosis not present

## 2015-12-10 DIAGNOSIS — I1310 Hypertensive heart and chronic kidney disease without heart failure, with stage 1 through stage 4 chronic kidney disease, or unspecified chronic kidney disease: Secondary | ICD-10-CM | POA: Diagnosis present

## 2015-12-10 DIAGNOSIS — N400 Enlarged prostate without lower urinary tract symptoms: Secondary | ICD-10-CM | POA: Diagnosis present

## 2015-12-10 DIAGNOSIS — C9111 Chronic lymphocytic leukemia of B-cell type in remission: Secondary | ICD-10-CM | POA: Diagnosis present

## 2015-12-10 DIAGNOSIS — I10 Essential (primary) hypertension: Secondary | ICD-10-CM | POA: Diagnosis not present

## 2015-12-10 DIAGNOSIS — Z87891 Personal history of nicotine dependence: Secondary | ICD-10-CM

## 2015-12-10 DIAGNOSIS — E785 Hyperlipidemia, unspecified: Secondary | ICD-10-CM | POA: Diagnosis present

## 2015-12-10 DIAGNOSIS — E119 Type 2 diabetes mellitus without complications: Secondary | ICD-10-CM | POA: Insufficient documentation

## 2015-12-10 DIAGNOSIS — C9110 Chronic lymphocytic leukemia of B-cell type not having achieved remission: Secondary | ICD-10-CM | POA: Diagnosis present

## 2015-12-10 DIAGNOSIS — Z794 Long term (current) use of insulin: Secondary | ICD-10-CM

## 2015-12-10 DIAGNOSIS — D638 Anemia in other chronic diseases classified elsewhere: Secondary | ICD-10-CM | POA: Diagnosis present

## 2015-12-10 DIAGNOSIS — E1165 Type 2 diabetes mellitus with hyperglycemia: Secondary | ICD-10-CM | POA: Diagnosis not present

## 2015-12-10 DIAGNOSIS — I129 Hypertensive chronic kidney disease with stage 1 through stage 4 chronic kidney disease, or unspecified chronic kidney disease: Secondary | ICD-10-CM | POA: Diagnosis present

## 2015-12-10 DIAGNOSIS — R509 Fever, unspecified: Secondary | ICD-10-CM | POA: Diagnosis not present

## 2015-12-10 DIAGNOSIS — Z79899 Other long term (current) drug therapy: Secondary | ICD-10-CM

## 2015-12-10 DIAGNOSIS — E860 Dehydration: Secondary | ICD-10-CM | POA: Diagnosis not present

## 2015-12-10 DIAGNOSIS — I2510 Atherosclerotic heart disease of native coronary artery without angina pectoris: Secondary | ICD-10-CM | POA: Diagnosis present

## 2015-12-10 DIAGNOSIS — N189 Chronic kidney disease, unspecified: Secondary | ICD-10-CM | POA: Diagnosis present

## 2015-12-10 DIAGNOSIS — D72829 Elevated white blood cell count, unspecified: Secondary | ICD-10-CM | POA: Diagnosis present

## 2015-12-10 LAB — COMPREHENSIVE METABOLIC PANEL
ALBUMIN: 4.1 g/dL (ref 3.5–5.0)
ALT: 26 U/L (ref 17–63)
ANION GAP: 8 (ref 5–15)
AST: 20 U/L (ref 15–41)
Alkaline Phosphatase: 56 U/L (ref 38–126)
BILIRUBIN TOTAL: 0.7 mg/dL (ref 0.3–1.2)
BUN: 56 mg/dL — ABNORMAL HIGH (ref 6–20)
CO2: 20 mmol/L — AB (ref 22–32)
Calcium: 9 mg/dL (ref 8.9–10.3)
Chloride: 108 mmol/L (ref 101–111)
Creatinine, Ser: 2.72 mg/dL — ABNORMAL HIGH (ref 0.61–1.24)
GFR calc Af Amer: 24 mL/min — ABNORMAL LOW (ref >60)
GFR calc non Af Amer: 21 mL/min — ABNORMAL LOW (ref >60)
GLUCOSE: 206 mg/dL — AB (ref 65–99)
POTASSIUM: 4.5 mmol/L (ref 3.5–5.1)
SODIUM: 136 mmol/L (ref 135–145)
TOTAL PROTEIN: 6.6 g/dL (ref 6.5–8.1)

## 2015-12-10 LAB — CBC WITH DIFFERENTIAL/PLATELET
BASOS ABS: 0 10*3/uL (ref 0.0–0.1)
Basophils Relative: 0 %
Eosinophils Absolute: 0.2 10*3/uL (ref 0.0–0.7)
Eosinophils Relative: 1 %
HCT: 31.4 % — ABNORMAL LOW (ref 39.0–52.0)
Hemoglobin: 10.7 g/dL — ABNORMAL LOW (ref 13.0–17.0)
LYMPHS PCT: 38 %
Lymphs Abs: 6.6 10*3/uL — ABNORMAL HIGH (ref 0.7–4.0)
MCH: 29.5 pg (ref 26.0–34.0)
MCHC: 34.1 g/dL (ref 30.0–36.0)
MCV: 86.5 fL (ref 78.0–100.0)
MONOS PCT: 5 %
Monocytes Absolute: 0.9 10*3/uL (ref 0.1–1.0)
NEUTROS ABS: 9.7 10*3/uL — AB (ref 1.7–7.7)
NEUTROS PCT: 56 %
PLATELETS: 224 10*3/uL (ref 150–400)
RBC: 3.63 MIL/uL — ABNORMAL LOW (ref 4.22–5.81)
RDW: 13.4 % (ref 11.5–15.5)
WBC: 17.4 10*3/uL — ABNORMAL HIGH (ref 4.0–10.5)

## 2015-12-10 LAB — URINE MICROSCOPIC-ADD ON: RBC / HPF: NONE SEEN RBC/hpf (ref 0–5)

## 2015-12-10 LAB — URINALYSIS, ROUTINE W REFLEX MICROSCOPIC
BILIRUBIN URINE: NEGATIVE
Glucose, UA: NEGATIVE mg/dL
Hgb urine dipstick: NEGATIVE
KETONES UR: NEGATIVE mg/dL
LEUKOCYTES UA: NEGATIVE
NITRITE: NEGATIVE
PROTEIN: 100 mg/dL — AB
Specific Gravity, Urine: 1.015 (ref 1.005–1.030)
pH: 5 (ref 5.0–8.0)

## 2015-12-10 LAB — LACTATE DEHYDROGENASE: LDH: 176 U/L (ref 98–192)

## 2015-12-10 LAB — GLUCOSE, CAPILLARY
GLUCOSE-CAPILLARY: 233 mg/dL — AB (ref 65–99)
Glucose-Capillary: 111 mg/dL — ABNORMAL HIGH (ref 65–99)

## 2015-12-10 LAB — I-STAT CG4 LACTIC ACID, ED
LACTIC ACID, VENOUS: 2.02 mmol/L — AB (ref 0.5–2.0)
LACTIC ACID, VENOUS: 0.96 mmol/L (ref 0.5–2.0)

## 2015-12-10 LAB — MAGNESIUM: Magnesium: 1.5 mg/dL — ABNORMAL LOW (ref 1.7–2.4)

## 2015-12-10 LAB — PROCALCITONIN: Procalcitonin: 0.46 ng/mL

## 2015-12-10 LAB — TSH: TSH: 0.441 u[IU]/mL (ref 0.350–4.500)

## 2015-12-10 LAB — PHOSPHORUS: Phosphorus: 2.1 mg/dL — ABNORMAL LOW (ref 2.5–4.6)

## 2015-12-10 MED ORDER — INSULIN ASPART 100 UNIT/ML ~~LOC~~ SOLN
0.0000 [IU] | Freq: Every day | SUBCUTANEOUS | Status: DC
  Administered 2015-12-10: 3 [IU] via SUBCUTANEOUS
  Administered 2015-12-11 – 2015-12-12 (×2): 2 [IU] via SUBCUTANEOUS

## 2015-12-10 MED ORDER — ASPIRIN 81 MG PO CHEW
81.0000 mg | CHEWABLE_TABLET | Freq: Every day | ORAL | Status: DC
  Administered 2015-12-10 – 2015-12-13 (×4): 81 mg via ORAL
  Filled 2015-12-10 (×4): qty 1

## 2015-12-10 MED ORDER — ACETAMINOPHEN 325 MG PO TABS
650.0000 mg | ORAL_TABLET | Freq: Four times a day (QID) | ORAL | Status: DC | PRN
  Administered 2015-12-10: 650 mg via ORAL

## 2015-12-10 MED ORDER — ADULT MULTIVITAMIN W/MINERALS CH
1.0000 | ORAL_TABLET | Freq: Every day | ORAL | Status: DC
  Administered 2015-12-11 – 2015-12-13 (×3): 1 via ORAL
  Filled 2015-12-10 (×3): qty 1

## 2015-12-10 MED ORDER — SODIUM CHLORIDE 0.9 % IV BOLUS (SEPSIS): 1000.0000 mL | Freq: Once | INTRAVENOUS | Status: DC

## 2015-12-10 MED ORDER — SODIUM CHLORIDE 0.9 % IV BOLUS (SEPSIS)
1000.0000 mL | Freq: Once | INTRAVENOUS | Status: AC
  Administered 2015-12-10: 1000 mL via INTRAVENOUS

## 2015-12-10 MED ORDER — PIPERACILLIN-TAZOBACTAM IN DEX 2-0.25 GM/50ML IV SOLN
2.2500 g | Freq: Four times a day (QID) | INTRAVENOUS | Status: DC
  Administered 2015-12-10 – 2015-12-11 (×3): 2.25 g via INTRAVENOUS
  Filled 2015-12-10 (×4): qty 50

## 2015-12-10 MED ORDER — VANCOMYCIN HCL IN DEXTROSE 1-5 GM/200ML-% IV SOLN
1000.0000 mg | Freq: Once | INTRAVENOUS | Status: AC
  Administered 2015-12-10: 1000 mg via INTRAVENOUS
  Filled 2015-12-10: qty 200

## 2015-12-10 MED ORDER — INSULIN DETEMIR 100 UNIT/ML ~~LOC~~ SOLN
30.0000 [IU] | Freq: Every day | SUBCUTANEOUS | Status: DC
  Administered 2015-12-10 – 2015-12-12 (×3): 30 [IU] via SUBCUTANEOUS
  Filled 2015-12-10 (×3): qty 0.3

## 2015-12-10 MED ORDER — SODIUM CHLORIDE 0.9 % IV BOLUS (SEPSIS): 500.0000 mL | Freq: Once | INTRAVENOUS | Status: DC

## 2015-12-10 MED ORDER — ISOSORBIDE MONONITRATE ER 30 MG PO TB24
120.0000 mg | ORAL_TABLET | Freq: Every day | ORAL | Status: DC
Start: 2015-12-11 — End: 2015-12-13
  Administered 2015-12-11 – 2015-12-13 (×3): 120 mg via ORAL
  Filled 2015-12-10 (×3): qty 4

## 2015-12-10 MED ORDER — DEXTROSE-NACL 5-0.45 % IV SOLN: INTRAVENOUS | Status: DC

## 2015-12-10 MED ORDER — BRIMONIDINE TARTRATE 0.15 % OP SOLN
1.0000 [drp] | Freq: Two times a day (BID) | OPHTHALMIC | Status: DC
  Administered 2015-12-10 – 2015-12-13 (×6): 1 [drp] via OPHTHALMIC
  Filled 2015-12-10: qty 5

## 2015-12-10 MED ORDER — ATORVASTATIN CALCIUM 10 MG PO TABS
10.0000 mg | ORAL_TABLET | Freq: Every day | ORAL | Status: DC
  Administered 2015-12-10 – 2015-12-12 (×3): 10 mg via ORAL
  Filled 2015-12-10 (×3): qty 1

## 2015-12-10 MED ORDER — PIPERACILLIN-TAZOBACTAM 3.375 G IVPB 30 MIN
3.3750 g | Freq: Once | INTRAVENOUS | Status: AC
  Administered 2015-12-10: 3.375 g via INTRAVENOUS
  Filled 2015-12-10: qty 50

## 2015-12-10 MED ORDER — LATANOPROST 0.005 % OP SOLN
1.0000 [drp] | Freq: Every day | OPHTHALMIC | Status: DC
  Administered 2015-12-10 – 2015-12-12 (×3): 1 [drp] via OPHTHALMIC
  Filled 2015-12-10: qty 2.5

## 2015-12-10 MED ORDER — ACETAMINOPHEN 325 MG PO TABS
650.0000 mg | ORAL_TABLET | Freq: Once | ORAL | Status: AC
  Administered 2015-12-10: 650 mg via ORAL
  Filled 2015-12-10: qty 2

## 2015-12-10 MED ORDER — VITAMIN D3 25 MCG (1000 UNIT) PO TABS
2000.0000 [IU] | ORAL_TABLET | Freq: Every day | ORAL | Status: DC
Start: 2015-12-11 — End: 2015-12-13
  Administered 2015-12-11 – 2015-12-13 (×3): 2000 [IU] via ORAL
  Filled 2015-12-10 (×6): qty 2

## 2015-12-10 MED ORDER — ONDANSETRON HCL 4 MG/2ML IJ SOLN: 4.0000 mg | Freq: Four times a day (QID) | INTRAMUSCULAR | Status: DC | PRN

## 2015-12-10 MED ORDER — FUROSEMIDE 40 MG PO TABS
40.0000 mg | ORAL_TABLET | Freq: Every day | ORAL | Status: DC
  Administered 2015-12-11 – 2015-12-13 (×3): 40 mg via ORAL
  Filled 2015-12-10 (×3): qty 1

## 2015-12-10 MED ORDER — TAMSULOSIN HCL 0.4 MG PO CAPS
0.4000 mg | ORAL_CAPSULE | Freq: Every day | ORAL | Status: DC
  Administered 2015-12-10 – 2015-12-12 (×3): 0.4 mg via ORAL
  Filled 2015-12-10 (×3): qty 1

## 2015-12-10 MED ORDER — VANCOMYCIN HCL 10 G IV SOLR
1500.0000 mg | INTRAVENOUS | Status: DC
  Filled 2015-12-10: qty 1500

## 2015-12-10 MED ORDER — ACETAMINOPHEN 650 MG RE SUPP: 650.0000 mg | Freq: Four times a day (QID) | RECTAL | Status: DC | PRN

## 2015-12-10 MED ORDER — PRASUGREL HCL 10 MG PO TABS
10.0000 mg | ORAL_TABLET | Freq: Every day | ORAL | Status: DC
  Administered 2015-12-10 – 2015-12-12 (×3): 10 mg via ORAL
  Filled 2015-12-10 (×4): qty 1

## 2015-12-10 MED ORDER — VANCOMYCIN HCL IN DEXTROSE 1-5 GM/200ML-% IV SOLN: 1000.0000 mg | Freq: Once | INTRAVENOUS | Status: DC

## 2015-12-10 MED ORDER — INSULIN ASPART 100 UNIT/ML ~~LOC~~ SOLN
0.0000 [IU] | Freq: Three times a day (TID) | SUBCUTANEOUS | Status: DC
  Administered 2015-12-11: 2 [IU] via SUBCUTANEOUS
  Administered 2015-12-11 (×2): 8 [IU] via SUBCUTANEOUS
  Administered 2015-12-12: 3 [IU] via SUBCUTANEOUS
  Administered 2015-12-12: 8 [IU] via SUBCUTANEOUS

## 2015-12-10 MED ORDER — SODIUM CHLORIDE 0.9 % IV BOLUS (SEPSIS)
500.0000 mL | Freq: Once | INTRAVENOUS | Status: AC
  Administered 2015-12-10: 500 mL via INTRAVENOUS

## 2015-12-10 MED ORDER — ONDANSETRON HCL 4 MG PO TABS: 4.0000 mg | ORAL_TABLET | Freq: Four times a day (QID) | ORAL | Status: DC | PRN

## 2015-12-10 MED ORDER — FINASTERIDE 5 MG PO TABS
5.0000 mg | ORAL_TABLET | Freq: Every day | ORAL | Status: DC
  Administered 2015-12-10 – 2015-12-13 (×4): 5 mg via ORAL
  Filled 2015-12-10 (×4): qty 1

## 2015-12-10 MED ORDER — SODIUM CHLORIDE 0.9 % IV SOLN
INTRAVENOUS | Status: AC
  Administered 2015-12-10: 17:00:00 via INTRAVENOUS

## 2015-12-10 MED ORDER — HYDRALAZINE HCL 50 MG PO TABS
50.0000 mg | ORAL_TABLET | Freq: Three times a day (TID) | ORAL | Status: DC
  Administered 2015-12-10 – 2015-12-13 (×9): 50 mg via ORAL
  Filled 2015-12-10 (×9): qty 1

## 2015-12-10 MED ORDER — VITAMIN C 500 MG PO TABS
1000.0000 mg | ORAL_TABLET | Freq: Every day | ORAL | Status: DC
  Administered 2015-12-11 – 2015-12-13 (×3): 1000 mg via ORAL
  Filled 2015-12-10 (×3): qty 2

## 2015-12-10 MED ORDER — CARVEDILOL 6.25 MG PO TABS
6.2500 mg | ORAL_TABLET | Freq: Two times a day (BID) | ORAL | Status: DC
  Administered 2015-12-10 – 2015-12-13 (×6): 6.25 mg via ORAL
  Filled 2015-12-10 (×6): qty 1

## 2015-12-10 NOTE — ED Notes (Signed)
Patient transported to X-ray 

## 2015-12-10 NOTE — H&P (Signed)
History and Physical    Ray Merritt [id number] DOB: [birth year] DOA: 12/10/2015  Referring Provider: Dr. Tomi Bamberger PCP: Antony Blackbird, MD  Outpatient Specialists:  Dr. Julien Nordmann   Patient coming from: home  Chief Complaint: general malaise, fever and weakness.   HPI: Ray Merritt is a 80 y.o. male with PMH significant for with hx of CLL (in remission); HTN, HLD, CAD, AOCD, uncontrolled diabetes mellitus and CKD stage 3; who presented to Ed complaining of general malaise, fatigue and fever. Patient found to have temp of 103 and just feeling bad. No CP, no nausea, no vomiting, no abd pain, no dysuria, no hematuria, no melena or hematochezia. There was no aggravated or alleviating factor.    ED Course: CXR neg for acute abnormalities; UA no suggesting UTI. Patient had pan-cultures taken; started on IVF's and received empiric IV antibiotics. Positive SIRS on admission (elevated lactic acid, temp of 103 and WBC's 17K); no source for infection appreciated currently.  Review of Systems:  All other systems reviewed and apart from HPI, are negative.  Past Medical History  Diagnosis Date  . Enlarged prostate   . Hypertension   . Anemia   . Coronary artery disease   . Diabetes mellitus     insulin dependent   . Chronic kidney disease   . Arthritis     Past Surgical History  Procedure Laterality Date  . Shoulder surgery    . Cardiac catheterization N/A 05/01/2015    Procedure: Left Heart Cath and Coronary Angiography;  Surgeon: Adrian Prows, MD;  Location: [place] [facility];  Service: Cardiovascular;  Laterality: N/A;  . Cardiac catheterization  05/01/2015    Procedure: Intravascular Pressure Wire/FFR Study;  Surgeon: Adrian Prows, MD;  Location: [place] [facility];  Service: Cardiovascular;;  . Cardiac catheterization N/A 05/22/2015    Procedure: Left Heart Cath and Coronary Angiography;  Surgeon: Adrian Prows, MD;  Location: [place] [facility];  Service: Cardiovascular;  Laterality: N/A;  .  Cardiac catheterization N/A 05/22/2015    Procedure: Coronary Stent Intervention;  Surgeon: Adrian Prows, MD;  Location: [place] [facility];  Service: Cardiovascular;  Laterality: N/A;     reports that he has quit smoking. He has never used smokeless tobacco. He reports that he does not drink alcohol or use illicit drugs.  No Known Allergies  Family History  Problem Relation Age of Onset  . Hypertension Mother   . Heart disease Sister   . Stroke Sister   . Diabetes Brother     Prior to Admission medications   Medication Sig Start Date End Date Taking? Authorizing Provider  ALPHAGAN P 0.1 % SOLN Place 1 drop into both eyes 2 (two) times daily. 10/17/15  Yes Historical Provider, MD  Ascorbic Acid (VITAMIN C) 1000 MG tablet Take 1,000 mg by mouth daily.   Yes Historical Provider, MD  aspirin 81 MG tablet Take 81 mg by mouth daily.   Yes Historical Provider, MD  atorvastatin (LIPITOR) 10 MG tablet Take 10 mg by mouth daily.   Yes Historical Provider, MD  carvedilol (COREG) 6.25 MG tablet Take 6.25 mg by mouth 2 (two) times daily with a meal. 11/11/15  Yes Historical Provider, MD  Cholecalciferol (VITAMIN D) 2000 units tablet Take 2,000 Units by mouth daily.   Yes Historical Provider, MD  diphenhydramine-acetaminophen (TYLENOL PM) 25-500 MG TABS tablet Take 2 tablets by mouth at bedtime.   Yes Historical Provider, MD  finasteride (PROSCAR) 5 MG tablet Take 5 mg by  mouth daily.   Yes Historical Provider, MD  furosemide (LASIX) 40 MG tablet Take 40 mg by mouth daily.   Yes Historical Provider, MD  glucose blood (BAYER CONTOUR NEXT TEST) test strip Use as instructed to check blood sugar 2 times per day dx code E11.65 Patient taking differently: 1 each by Other route See admin instructions. Check blood sugar 3 times daily 01/26/15  Yes Elayne Snare, MD  hydrALAZINE (APRESOLINE) 50 MG tablet Take 50 mg by mouth 3 (three) times daily. 04/25/15  Yes Historical Provider, MD  insulin NPH (HUMULIN N,NOVOLIN N)  100 UNIT/ML injection Inject 15-28 Units into the skin 2 (two) times daily. Inject 15 units subque in the morning and 28 units subque in the evening before bed   Yes Historical Provider, MD  isosorbide mononitrate (IMDUR) 120 MG 24 hr tablet Take 120 mg by mouth daily.   Yes Historical Provider, MD  Multiple Vitamin (MULTIVITAMIN WITH MINERALS) TABS tablet Take 1 tablet by mouth daily.   Yes Historical Provider, MD  NOVOLIN R 100 UNIT/ML injection inject 10 units subcutaneously every morning BEFORE BREAKFAST ,5 UNITS BEFORE LUNCH, AND 10 UNITS BEFORE SUPPER Patient taking differently: Injects 6 units subcutaneous twice per day 11/12/15  Yes Elayne Snare, MD  prasugrel (EFFIENT) 10 MG TABS tablet Take 1 tablet (10 mg total) by mouth daily. Patient taking differently: Take 10 mg by mouth at bedtime.  05/24/15  Yes Neldon Labella, NP  tamsulosin (FLOMAX) 0.4 MG CAPS capsule Take 0.4 mg by mouth daily after supper.   Yes Historical Provider, MD  Travoprost, BAK Free, (TRAVATAN) 0.004 % SOLN ophthalmic solution Place 1 drop into both eyes at bedtime.   Yes Historical Provider, MD  triamcinolone cream (KENALOG) 0.1 % Apply 1 application topically daily.  10/12/14  Yes Historical Provider, MD    Physical Exam: Filed Vitals:   12/10/15 1330 12/10/15 1400 12/10/15 1426 12/10/15 1449  BP: 171/75 166/64 161/59 171/65  Pulse: 78 72 69 76  Temp:   101.7 F (38.7 C) 98.9 F (37.2 C)  TempSrc:   Oral Oral  Resp: 23 16 18 20   Height:    6\' 3"  (1.905 m)  Weight:    111.131 kg (245 lb)  SpO2: 97% 98% 99% 100%      Constitutional: mild distress secondary to general malaise, warm to touch, positive fever on VS; no CP. Eyes: PERRLA, lids and conjunctivae normal, no icterus ENMT: Mucous membranes dry on exam. Posterior pharynx clear of any exudate or lesions. Normal dentition.  Neck: normal, supple, no masses, no thyromegaly, no JVD Respiratory: clear to auscultation bilaterally, no wheezing, no crackles.  Normal respiratory effort. No accessory muscle use.  Cardiovascular: S1 & S2 heard, regular rate and rhythm, No rubs or gallops. 2+ pedal pulses. No carotid bruits.  Abdomen: No distension, no tenderness, no masses palpated. No hepatosplenomegaly. Bowel sounds normal.  Musculoskeletal: no clubbing / cyanosis. No joint deformity upper and lower extremities. Good ROM, no contractures. Normal muscle tone.  Skin: no rashes, lesions, ulcers. No induration Neurologic: CN 2-12 grossly intact. Sensation intact, DTR normal. Strength 5/5 in all 4 limbs.  Psychiatric: Normal judgment and insight. Alert and oriented x 3. Normal mood.    Labs on Admission: I have personally reviewed following labs and imaging studies  CBC:  Recent Labs Lab 12/10/15 1101  WBC 17.4*  NEUTROABS 9.7*  HGB 10.7*  HCT 31.4*  MCV 86.5  PLT [id number]   Basic Metabolic Panel:  Recent Labs Lab  12/10/15 1101  NA 136  K 4.5  CL 108  CO2 20*  GLUCOSE 206*  BUN 56*  CREATININE 2.72*  CALCIUM 9.0   GFR: Estimated Creatinine Clearance: 30.6 mL/min (by C-G formula based on Cr of 2.72).   Liver Function Tests:  Recent Labs Lab 12/10/15 1101  AST 20  ALT 26  ALKPHOS 56  BILITOT 0.7  PROT 6.6  ALBUMIN 4.1   Urine analysis:    Component Value Date/Time   COLORURINE YELLOW 12/10/2015 [place] 12/10/2015 1208   LABSPEC 1.015 12/10/2015 1208   PHURINE 5.0 12/10/2015 [place] 12/10/2015 [place] 12/10/2015 [place] 12/10/2015 [place] 12/10/2015 1208   PROTEINUR 100* 12/10/2015 1208   UROBILINOGEN 0.2 10/07/2011 1203   NITRITE NEGATIVE 12/10/2015 1208   LEUKOCYTESUR NEGATIVE 12/10/2015 1208   Radiological Exams on Admission: Dg Chest 2 View  12/10/2015  CLINICAL DATA:  Pt sudden onset of fever with tremors. EXAM: CHEST  2 VIEW COMPARISON:  08/19/2005 FINDINGS: The heart size and mediastinal contours are within normal limits.  Both lungs are clear. The visualized skeletal structures are unremarkable. IMPRESSION: No active cardiopulmonary disease. Electronically Signed   By: Skipper Cliche M.D.   On: 12/10/2015 12:50    EKG:  Sinus rhythm, LVH by voltage and diffuse early repolarization abnormalities. Normal axis   Assessment/Plan 1-Fever of unknown injury/SIRS -normal CXR and no abnormality  -no signs of UTI on his UA -patient with elevated WBC's and Temp of 103; also with lactic acid mildly elevated on presentation. -IVF's has been given in the ED and was empirically started on vanc and zosyn  -blood cx's taken, 2-D echo ordered, procalcitonin ordered -will follow culture data and clinical response  -will use PRN antipyretics -will also check LDH (?? Relapse of CLL)  2-Leukocytosis: -demargination vs infection -will provide IVF's -empirically on antibiotics -will follow trend  3-CLL (chronic lymphocytic leukemia) [facility]): -oncology service aware of admission -apparently on remission   4-Type II diabetes mellitus, uncontrolled ([place]): -uncontrolled and with hyperglycemia -will use SSI and levemir -will check A1C  5-Chronic kidney disease (CKD): -Appears to be stable -will follow renal function trend  6-Hypertension, essential, benign: -elevated; as patient has not have any of his meds today -will resume home antihypertensive regimen -will follow VS  7-Hyperlipidemia: -continue statins  8-BPH (benign prostatic hyperplasia): -continue flomax and proscar  DVT prophylaxis: SCD's  Code Status: Full code Family Communication: no family at bedside   Disposition Plan:  Home when medically stable Consults called:oncology aware of admission  Admission status:  Observation, LOS < 2 midnights; Arby Barrette MD [facility] Pager [phone]  If 7PM-7AM, please contact night-coverage [email or web address] Password [facility]  12/10/2015, 5:09 PM

## 2015-12-10 NOTE — ED Provider Notes (Addendum)
CSN: [id number]     Arrival date & time 12/10/15  1032 History   First MD Initiated Contact with Patient 12/10/15 1052     Chief Complaint  Patient presents with  . Cancer  . Fever   HPI Patient presents to the emergency room with acute onset of shaking chills and weakness. He was feeling well over the weekend.  He was able to mow his lawn without any difficulties. Patient states when he woke up this morning he had sudden onset of diffuse shaking.  He denies any trouble with loss of consciousness. No headache. She denies any trouble with chest pain or shortness of breath. He denies any trouble with dysuria. No urinary frequency. No rashes.  No tick bite.  No travel. Past Medical History  Diagnosis Date  . Enlarged prostate   . Hypertension   . Anemia   . Coronary artery disease   . Diabetes mellitus     insulin dependent   . Chronic kidney disease   . Arthritis    Past Surgical History  Procedure Laterality Date  . Shoulder surgery    . Cardiac catheterization N/A 05/01/2015    Procedure: Left Heart Cath and Coronary Angiography;  Surgeon: Adrian Prows, MD;  Location: [place] [facility];  Service: Cardiovascular;  Laterality: N/A;  . Cardiac catheterization  05/01/2015    Procedure: Intravascular Pressure Wire/FFR Study;  Surgeon: Adrian Prows, MD;  Location: [place] [facility];  Service: Cardiovascular;;  . Cardiac catheterization N/A 05/22/2015    Procedure: Left Heart Cath and Coronary Angiography;  Surgeon: Adrian Prows, MD;  Location: [place] [facility];  Service: Cardiovascular;  Laterality: N/A;  . Cardiac catheterization N/A 05/22/2015    Procedure: Coronary Stent Intervention;  Surgeon: Adrian Prows, MD;  Location: [place] [facility];  Service: Cardiovascular;  Laterality: N/A;   Family History  Problem Relation Age of Onset  . Hypertension Mother   . Heart disease Sister   . Stroke Sister   . Diabetes Brother    Social History  Substance Use Topics  . Smoking status: Former  Research scientist (life sciences)  . Smokeless tobacco: Never Used     Comment: quit smoking in early 70's  . Alcohol Use: No    Review of Systems  All other systems reviewed and are negative.     Allergies  Review of patient's allergies indicates no known allergies.  Home Medications   Prior to Admission medications   Medication Sig Start Date End Date Taking? Authorizing Provider  ALPHAGAN P 0.1 % SOLN Place 1 drop into both eyes 2 (two) times daily. 10/17/15  Yes Historical Provider, MD  Ascorbic Acid (VITAMIN C) 1000 MG tablet Take 1,000 mg by mouth daily.   Yes Historical Provider, MD  aspirin 81 MG tablet Take 81 mg by mouth daily.   Yes Historical Provider, MD  atorvastatin (LIPITOR) 10 MG tablet Take 10 mg by mouth daily.   Yes Historical Provider, MD  carvedilol (COREG) 6.25 MG tablet Take 6.25 mg by mouth 2 (two) times daily with a meal. 11/11/15  Yes Historical Provider, MD  Cholecalciferol (VITAMIN D) 2000 units tablet Take 2,000 Units by mouth daily.   Yes Historical Provider, MD  diphenhydramine-acetaminophen (TYLENOL PM) 25-500 MG TABS tablet Take 2 tablets by mouth at bedtime.   Yes Historical Provider, MD  finasteride (PROSCAR) 5 MG tablet Take 5 mg by mouth daily.   Yes Historical Provider, MD  furosemide (LASIX) 40 MG tablet Take 40 mg by mouth  daily.   Yes Historical Provider, MD  glucose blood (BAYER CONTOUR NEXT TEST) test strip Use as instructed to check blood sugar 2 times per day dx code E11.65 Patient taking differently: 1 each by Other route See admin instructions. Check blood sugar 3 times daily 01/26/15  Yes Elayne Snare, MD  hydrALAZINE (APRESOLINE) 50 MG tablet Take 50 mg by mouth 3 (three) times daily. 04/25/15  Yes Historical Provider, MD  insulin NPH (HUMULIN N,NOVOLIN N) 100 UNIT/ML injection Inject 15-28 Units into the skin 2 (two) times daily. Inject 15 units subque in the morning and 28 units subque in the evening before bed   Yes Historical Provider, MD  isosorbide mononitrate  (IMDUR) 120 MG 24 hr tablet Take 120 mg by mouth daily.   Yes Historical Provider, MD  Multiple Vitamin (MULTIVITAMIN WITH MINERALS) TABS tablet Take 1 tablet by mouth daily.   Yes Historical Provider, MD  NOVOLIN R 100 UNIT/ML injection inject 10 units subcutaneously every morning BEFORE BREAKFAST ,5 UNITS BEFORE LUNCH, AND 10 UNITS BEFORE SUPPER Patient taking differently: Injects 6 units subcutaneous twice per day 11/12/15  Yes Elayne Snare, MD  prasugrel (EFFIENT) 10 MG TABS tablet Take 1 tablet (10 mg total) by mouth daily. Patient taking differently: Take 10 mg by mouth at bedtime.  05/24/15  Yes Neldon Labella, NP  tamsulosin (FLOMAX) 0.4 MG CAPS capsule Take 0.4 mg by mouth daily after supper.   Yes Historical Provider, MD  Travoprost, BAK Free, (TRAVATAN) 0.004 % SOLN ophthalmic solution Place 1 drop into both eyes at bedtime.   Yes Historical Provider, MD  triamcinolone cream (KENALOG) 0.1 % Apply 1 application topically daily.  10/12/14  Yes Historical Provider, MD   BP 174/55 mmHg  Pulse 72  Temp(Src) 102.9 F (39.4 C) (Rectal)  Resp 19  Ht 6\' 3"  (1.905 m)  Wt 111.131 kg  BMI 30.62 kg/m2  SpO2 100% Physical Exam  Constitutional: No distress.  Febrile, tachypnea  HENT:  Head: Normocephalic and atraumatic.  Right Ear: External ear normal.  Left Ear: External ear normal.  Eyes: Conjunctivae are normal. Right eye exhibits no discharge. Left eye exhibits no discharge. No scleral icterus.  Neck: Neck supple. No tracheal deviation present.  Cardiovascular: Normal rate, regular rhythm and intact distal pulses.   Pulmonary/Chest: Effort normal and breath sounds normal. No stridor. No respiratory distress. He has no wheezes. He has no rales.  Abdominal: Soft. Bowel sounds are normal. He exhibits no distension. There is no tenderness. There is no rebound and no guarding.  Musculoskeletal: He exhibits no edema or tenderness.  Neurological: He is alert. He has normal strength. No cranial  nerve deficit (no facial droop, extraocular movements intact, no slurred speech) or sensory deficit. He exhibits normal muscle tone. He displays no seizure activity. Coordination normal.  Skin: Skin is warm and dry. No rash noted. He is not diaphoretic.  Psychiatric: He has a normal mood and affect.  Nursing note and vitals reviewed.   ED Course  Procedures (including critical care time)  Medications  sodium chloride 0.9 % bolus 1,000 mL (1,000 mLs Intravenous New Bag/Given 12/10/15 1200)    And  sodium chloride 0.9 % bolus 500 mL (not administered)  piperacillin-tazobactam (ZOSYN) IVPB 3.375 g (not administered)  vancomycin (VANCOCIN) IVPB 1000 mg/200 mL premix (not administered)  acetaminophen (TYLENOL) tablet 650 mg (not administered)  sodium chloride 0.9 % bolus 1,000 mL (0 mLs Intravenous Stopped 12/10/15 1141)    And  sodium chloride 0.9 %  bolus 1,000 mL (0 mLs Intravenous Stopped 12/10/15 1142)    Labs Review Labs Reviewed  COMPREHENSIVE METABOLIC PANEL - Abnormal; Notable for the following:    CO2 20 (*)    Glucose, Bld 206 (*)    BUN 56 (*)    Creatinine, Ser 2.72 (*)    GFR calc non Af Amer 21 (*)    GFR calc Af Amer 24 (*)    All other components within normal limits  CBC WITH DIFFERENTIAL/PLATELET - Abnormal; Notable for the following:    WBC 17.4 (*)    RBC 3.63 (*)    Hemoglobin 10.7 (*)    HCT 31.4 (*)    Neutro Abs 9.7 (*)    Lymphs Abs 6.6 (*)    All other components within normal limits  URINALYSIS, ROUTINE W REFLEX MICROSCOPIC (NOT AT [facility]) - Abnormal; Notable for the following:    Protein, ur 100 (*)    All other components within normal limits  URINE MICROSCOPIC-ADD ON - Abnormal; Notable for the following:    Squamous Epithelial / LPF 0-5 (*)    Bacteria, UA RARE (*)    All other components within normal limits  I-STAT CG4 LACTIC ACID, ED - Abnormal; Notable for the following:    Lactic Acid, Venous 2.02 (*)    All other components within normal  limits  CULTURE, BLOOD (ROUTINE X 2)  CULTURE, BLOOD (ROUTINE X 2)  URINE CULTURE  PATHOLOGIST SMEAR REVIEW    Imaging Review Dg Chest 2 View  12/10/2015  CLINICAL DATA:  Pt sudden onset of fever with tremors. EXAM: CHEST  2 VIEW COMPARISON:  08/19/2005 FINDINGS: The heart size and mediastinal contours are within normal limits. Both lungs are clear. The visualized skeletal structures are unremarkable. IMPRESSION: No active cardiopulmonary disease. Electronically Signed   By: Skipper Cliche M.D.   On: 12/10/2015 12:50   I have personally reviewed and evaluated these images and lab results as part of my medical decision-making.   EKG Interpretation   Date/Time:  Monday Dec 10 2015 10:45:46 EDT Ventricular Rate:  74 PR Interval:  172 QRS Duration: 94 QT Interval:  374 QTC Calculation: 415 R Axis:   75 Text Interpretation:  Sinus rhythm baseline artifact No significant change  since last tracing Confirmed by Temple Sporer  MD-J, Deon Duer [id number]) on 12/10/2015  11:28:30 AM      MDM   Final diagnoses:  Fever, unspecified fever cause    Pt presents to the ED with acute weakness, fever, rigors.  No focal symptoms or source of infection.  Labs show elevated wbc and increased lactic acid level.  REnal insuff is chronic.    Sx are concerning for the possibility of bacteremia, early sepsis.  Will consult with medical service for admission, serial evaluation, monitoring.    Dorie Rank, MD 12/10/15 [phone]

## 2015-12-10 NOTE — ED Notes (Signed)
[facility] CATH PLACED BY Evalina Tabak NT. PT TOLERATED

## 2015-12-10 NOTE — ED Notes (Signed)
MD at bedside. 

## 2015-12-10 NOTE — ED Notes (Signed)
Pt resides at home. Pt sudden onset of fever with tremors. (101.5) oral at home. No other symptoms.  Denies N/V/D no pain. Neg for stroke. Urinary and BM without problems this AM.   Tylenol 1000mg  at 10:22AM.

## 2015-12-10 NOTE — ED Notes (Signed)
Attempted to call report. Charge requesting additional time. Contact information given. Charge [place] RN made aware.

## 2015-12-10 NOTE — Progress Notes (Signed)
Pharmacy Antibiotic Follow-up Note  Ray Merritt is a 80 y.o. year-old male admitted on 12/10/2015.  The patient is currently on day 1 of Vancomycin & Zosyn for r/o sepsis, source of infection unknown on admit.  Assessment/Plan: Vancomycin 2gm x1, then 1500mg   IV every 48 hours.  Goal trough 15-20 mcg/mL.  Zosyn 3.375gm x1, then 2.25gm q6hr  Temp (24hrs), Avg:102.4 F (39.1 C), Min:101.4 F (38.6 C), Max:103.4 F (39.7 C)   Recent Labs Lab 12/10/15 1101  WBC 17.4*    Recent Labs Lab 12/10/15 1101  CREATININE 2.72*   Estimated Creatinine Clearance: 30.6 mL/min (by C-G formula based on Cr of 2.72).    No Known Allergies  Antimicrobials this admission: 5/22 Zosyn >>  5/22 Vancomycin  >>   Levels/dose changes this admission:  Microbiology results: 5/22 BCx: sent 5/22 UCx: sent   Thank you for allowing pharmacy to be a part of this patient's care.  Minda Ditto PharmD 12/10/2015 1:21 PM

## 2015-12-10 NOTE — ED Notes (Signed)
MD at bedside. EDP J KNAPP

## 2015-12-10 NOTE — ED Notes (Signed)
PER LAUREN ALLEN RN SPEAKING WITH EDP JON KNAPP. NO ANTIBIOTICS WILL BE ADMINISTERED AT THIS TIME.

## 2015-12-11 ENCOUNTER — Observation Stay (HOSPITAL_BASED_OUTPATIENT_CLINIC_OR_DEPARTMENT_OTHER): Payer: Medicare Other

## 2015-12-11 DIAGNOSIS — E860 Dehydration: Secondary | ICD-10-CM | POA: Diagnosis present

## 2015-12-11 DIAGNOSIS — N400 Enlarged prostate without lower urinary tract symptoms: Secondary | ICD-10-CM

## 2015-12-11 DIAGNOSIS — R509 Fever, unspecified: Secondary | ICD-10-CM

## 2015-12-11 DIAGNOSIS — I10 Essential (primary) hypertension: Secondary | ICD-10-CM

## 2015-12-11 DIAGNOSIS — E1122 Type 2 diabetes mellitus with diabetic chronic kidney disease: Secondary | ICD-10-CM | POA: Diagnosis present

## 2015-12-11 DIAGNOSIS — C9111 Chronic lymphocytic leukemia of B-cell type in remission: Secondary | ICD-10-CM | POA: Diagnosis present

## 2015-12-11 DIAGNOSIS — Z79899 Other long term (current) drug therapy: Secondary | ICD-10-CM | POA: Diagnosis not present

## 2015-12-11 DIAGNOSIS — Z8249 Family history of ischemic heart disease and other diseases of the circulatory system: Secondary | ICD-10-CM | POA: Diagnosis not present

## 2015-12-11 DIAGNOSIS — D72829 Elevated white blood cell count, unspecified: Secondary | ICD-10-CM

## 2015-12-11 DIAGNOSIS — Z794 Long term (current) use of insulin: Secondary | ICD-10-CM | POA: Diagnosis not present

## 2015-12-11 DIAGNOSIS — Z7982 Long term (current) use of aspirin: Secondary | ICD-10-CM | POA: Diagnosis not present

## 2015-12-11 DIAGNOSIS — D638 Anemia in other chronic diseases classified elsewhere: Secondary | ICD-10-CM | POA: Diagnosis present

## 2015-12-11 DIAGNOSIS — E785 Hyperlipidemia, unspecified: Secondary | ICD-10-CM

## 2015-12-11 DIAGNOSIS — E1165 Type 2 diabetes mellitus with hyperglycemia: Secondary | ICD-10-CM | POA: Diagnosis present

## 2015-12-11 DIAGNOSIS — I2510 Atherosclerotic heart disease of native coronary artery without angina pectoris: Secondary | ICD-10-CM | POA: Diagnosis present

## 2015-12-11 DIAGNOSIS — C9110 Chronic lymphocytic leukemia of B-cell type not having achieved remission: Secondary | ICD-10-CM

## 2015-12-11 DIAGNOSIS — A419 Sepsis, unspecified organism: Secondary | ICD-10-CM | POA: Diagnosis present

## 2015-12-11 DIAGNOSIS — N390 Urinary tract infection, site not specified: Secondary | ICD-10-CM | POA: Diagnosis present

## 2015-12-11 DIAGNOSIS — E119 Type 2 diabetes mellitus without complications: Secondary | ICD-10-CM

## 2015-12-11 DIAGNOSIS — Z87891 Personal history of nicotine dependence: Secondary | ICD-10-CM | POA: Diagnosis not present

## 2015-12-11 DIAGNOSIS — N183 Chronic kidney disease, stage 3 (moderate): Secondary | ICD-10-CM | POA: Diagnosis present

## 2015-12-11 DIAGNOSIS — N189 Chronic kidney disease, unspecified: Secondary | ICD-10-CM | POA: Diagnosis not present

## 2015-12-11 DIAGNOSIS — I129 Hypertensive chronic kidney disease with stage 1 through stage 4 chronic kidney disease, or unspecified chronic kidney disease: Secondary | ICD-10-CM | POA: Diagnosis present

## 2015-12-11 DIAGNOSIS — I1310 Hypertensive heart and chronic kidney disease without heart failure, with stage 1 through stage 4 chronic kidney disease, or unspecified chronic kidney disease: Secondary | ICD-10-CM | POA: Diagnosis present

## 2015-12-11 LAB — DIFFERENTIAL
BASOS ABS: 0 10*3/uL (ref 0.0–0.1)
Basophils Relative: 0 %
Eosinophils Absolute: 0.1 10*3/uL (ref 0.0–0.7)
Eosinophils Relative: 0 %
LYMPHS ABS: 5.7 10*3/uL — AB (ref 0.7–4.0)
LYMPHS PCT: 27 %
Monocytes Absolute: 1.1 10*3/uL — ABNORMAL HIGH (ref 0.1–1.0)
Monocytes Relative: 5 %
NEUTROS ABS: 14 10*3/uL — AB (ref 1.7–7.7)
NEUTROS PCT: 68 %

## 2015-12-11 LAB — BASIC METABOLIC PANEL
Anion gap: 8 (ref 5–15)
BUN: 49 mg/dL — ABNORMAL HIGH (ref 6–20)
CHLORIDE: 105 mmol/L (ref 101–111)
CO2: 20 mmol/L — ABNORMAL LOW (ref 22–32)
Calcium: 8 mg/dL — ABNORMAL LOW (ref 8.9–10.3)
Creatinine, Ser: 2.88 mg/dL — ABNORMAL HIGH (ref 0.61–1.24)
GFR, EST AFRICAN AMERICAN: 23 mL/min — AB (ref >60)
GFR, EST NON AFRICAN AMERICAN: 20 mL/min — AB (ref >60)
Glucose, Bld: 193 mg/dL — ABNORMAL HIGH (ref 65–99)
POTASSIUM: 4.1 mmol/L (ref 3.5–5.1)
SODIUM: 133 mmol/L — AB (ref 135–145)

## 2015-12-11 LAB — GLUCOSE, CAPILLARY
GLUCOSE-CAPILLARY: 146 mg/dL — AB (ref 65–99)
GLUCOSE-CAPILLARY: 287 mg/dL — AB (ref 65–99)
GLUCOSE-CAPILLARY: 251 mg/dL — AB (ref 65–99)
GLUCOSE-CAPILLARY: 213 mg/dL — AB (ref 65–99)

## 2015-12-11 LAB — RETICULOCYTES
RBC.: 3.28 MIL/uL — ABNORMAL LOW (ref 4.22–5.81)
RETIC COUNT ABSOLUTE: 49.2 10*3/uL (ref 19.0–186.0)
Retic Ct Pct: 1.5 % (ref 0.4–3.1)

## 2015-12-11 LAB — ECHOCARDIOGRAM COMPLETE
HEIGHTINCHES: 75 in
Weight: 3772.8 oz

## 2015-12-11 LAB — FOLATE: FOLATE: 23 ng/mL (ref >5.9)

## 2015-12-11 LAB — FERRITIN: FERRITIN: 78 ng/mL (ref 24–336)

## 2015-12-11 LAB — IRON AND TIBC
IRON: 17 ug/dL — AB (ref 45–182)
SATURATION RATIOS: 6 % — AB (ref 17.9–39.5)
TIBC: 262 ug/dL (ref 250–450)
UIBC: 245 ug/dL

## 2015-12-11 LAB — SAVE SMEAR

## 2015-12-11 LAB — MAGNESIUM: Magnesium: 1.5 mg/dL — ABNORMAL LOW (ref 1.7–2.4)

## 2015-12-11 LAB — CBC
HEMATOCRIT: 29 % — AB (ref 39.0–52.0)
Hemoglobin: 9.6 g/dL — ABNORMAL LOW (ref 13.0–17.0)
MCH: 29.4 pg (ref 26.0–34.0)
MCHC: 33.1 g/dL (ref 30.0–36.0)
MCV: 89 fL (ref 78.0–100.0)
PLATELETS: 200 10*3/uL (ref 150–400)
RBC: 3.26 MIL/uL — AB (ref 4.22–5.81)
RDW: 13.8 % (ref 11.5–15.5)
WBC: 20.9 10*3/uL — AB (ref 4.0–10.5)

## 2015-12-11 LAB — URINE CULTURE

## 2015-12-11 LAB — VITAMIN B12: Vitamin B-12: 596 pg/mL (ref 180–914)

## 2015-12-11 MED ORDER — MAGNESIUM SULFATE 4 GM/100ML IV SOLN
4.0000 g | Freq: Once | INTRAVENOUS | Status: AC
  Administered 2015-12-11: 4 g via INTRAVENOUS
  Filled 2015-12-11: qty 100

## 2015-12-11 MED ORDER — PIPERACILLIN-TAZOBACTAM 3.375 G IVPB
3.3750 g | Freq: Three times a day (TID) | INTRAVENOUS | Status: DC
  Administered 2015-12-11 – 2015-12-13 (×6): 3.375 g via INTRAVENOUS
  Filled 2015-12-11 (×6): qty 50

## 2015-12-11 NOTE — Progress Notes (Signed)
PROGRESS NOTE    Ray Merritt  [id number] DOB: Mar 22, 1936 DOA: 12/10/2015 PCP: Antony Blackbird, MD    Assessment & Plan:   Principal Problem:   Fever Active Problems:   Leukocytosis   CLL (chronic lymphocytic leukemia) ([place])   Type II diabetes mellitus, uncontrolled ([place])   Chronic kidney disease (CKD)   Hypertension, essential, benign   Hyperlipidemia   HLD (hyperlipidemia)   BPH (benign prostatic hyperplasia)   FUO (fever of unknown origin)  #1 fever of unknown origin/SIRS Possible etiology. Urinalysis is negative. Chest x-ray negative. Blood cultures pending. Patient still with fevers. Leukocytosis trending back up. Continue empiric IV vancomycin and IV Zosyn. IV fluids. Supportive care. Follow.  #2 dehydration IV fluids.  #3 leukocytosis Questionable etiology. Concern for recurrent CLL however in discussion with Dr. Theophilus Bones, oncology is felt patient likely has an infectious etiology. Infectious workup negative to date including chest x-ray and urinalysis. Blood cultures pending. Patient denies any diarrhea. Follow.  #4 type 2 diabetes mellitus Hemoglobin A1c was 7.6 on 10/17/2015. CBGs have ranged from 146---> 287. Continue Levemir and sliding scale insulin.  #5 hyperlipidemia Continue Lipitor.  #6 hypertension Continue Coreg, Proscar, Lasix, hydralazine, Imdur.  #7 BPH Continue Flomax and proscar  #8 CLL in remission Per oncology.  #9 chronic kidney disease Stable.    DVT prophylaxis: SCDs Code Status: Full Family Communication: Patient, wife and daughter at bedside. Disposition Plan: Home when medically stable.   Consultants:   Curb sided oncology: Dr. Lorna Few  Procedures:   2-D echo pending 12/11/2015  Chest x-ray 12/10/2015  Antimicrobials:  IV vancomycin 12/10/2015  IV Zosyn 12/10/2015   Subjective: Patient denies any chest. No shortness of breath. Asking when he can go home.  Objective: Filed Vitals:   12/10/15  1800 12/10/15 1845 12/10/15 2116 12/11/15 0517  BP:   146/52 150/56  Pulse:   64 64  Temp: 101.6 F (38.7 C) 100.6 F (38.1 C) 98.7 F (37.1 C) 98.6 F (37 C)  TempSrc: Oral Oral Oral Oral  Resp:   20 20  Height:      Weight:    106.958 kg (235 lb 12.8 oz)  SpO2:   98% 100%    Intake/Output Summary (Last 24 hours) at 12/11/15 1151 Last data filed at 12/11/15 0515  Gross per 24 hour  Intake   3840 ml  Output   2050 ml  Net   1790 ml   Filed Weights   12/10/15 1106 12/10/15 1449 12/11/15 0517  Weight: 111.131 kg (245 lb) 111.131 kg (245 lb) 106.958 kg (235 lb 12.8 oz)    Examination:  General exam: Appears calm and comfortable  Respiratory system: Clear to auscultation. Respiratory effort normal. Cardiovascular system: S1 & S2 heard, RRR. No JVD, murmurs, rubs, gallops or clicks. No pedal edema. Gastrointestinal system: Abdomen is Mildly distended, soft and nontender. No organomegaly or masses felt. Normal bowel sounds heard. Central nervous system: Alert and oriented. No focal neurological deficits. Extremities: Symmetric 5 x 5 power. Skin: No rashes, lesions or ulcers Psychiatry: Judgement and insight appear normal. Mood & affect appropriate.     Data Reviewed: I have personally reviewed following labs and imaging studies  CBC:  Recent Labs Lab 12/10/15 1101 12/11/15 0351  WBC 17.4* 20.9*  NEUTROABS 9.7* 14.0*  HGB 10.7* 9.6*  HCT 31.4* 29.0*  MCV 86.5 89.0  PLT 224 [id number]   Basic Metabolic Panel:  Recent Labs Lab 12/10/15 1101 12/10/15 2140 12/11/15 0351 12/11/15 [id number]  NA 136  --  133*  --   K 4.5  --  4.1  --   CL 108  --  105  --   CO2 20*  --  20*  --   GLUCOSE 206*  --  193*  --   BUN 56*  --  49*  --   CREATININE 2.72*  --  2.88*  --   CALCIUM 9.0  --  8.0*  --   MG  --  1.5*  --  1.5*  PHOS  --  2.1*  --   --    GFR: Estimated Creatinine Clearance: 28.4 mL/min (by C-G formula based on Cr of 2.88). Liver Function Tests:  Recent  Labs Lab 12/10/15 1101  AST 20  ALT 26  ALKPHOS 56  BILITOT 0.7  PROT 6.6  ALBUMIN 4.1   No results for input(s): LIPASE, AMYLASE in the last 168 hours. No results for input(s): AMMONIA in the last 168 hours. Coagulation Profile: No results for input(s): INR, PROTIME in the last 168 hours. Cardiac Enzymes: No results for input(s): CKTOTAL, CKMB, CKMBINDEX, TROPONINI in the last 168 hours. BNP (last 3 results) No results for input(s): PROBNP in the last 8760 hours. HbA1C: No results for input(s): HGBA1C in the last 72 hours. CBG:  Recent Labs Lab 12/10/15 1741 12/10/15 2116 12/11/15 0728 12/11/15 1127  GLUCAP 111* 233* 146* 287*   Lipid Profile: No results for input(s): CHOL, HDL, LDLCALC, TRIG, CHOLHDL, LDLDIRECT in the last 72 hours. Thyroid Function Tests:  Recent Labs  12/10/15 2140  TSH 0.441   Anemia Panel:  Recent Labs  12/11/15 0826  RETICCTPCT 1.5   Sepsis Labs:  Recent Labs Lab 12/10/15 1114 12/10/15 1415 12/10/15 1422  PROCALCITON  --  0.46  --   LATICACIDVEN 2.02*  --  0.96    Recent Results (from the past 240 hour(s))  Urine culture     Status: Abnormal   Collection Time: 12/10/15 12:08 PM  Result Value Ref Range Status   Specimen Description URINE, CLEAN CATCH  Final   Special Requests NONE  Final   Culture MULTIPLE SPECIES PRESENT, SUGGEST RECOLLECTION (A)  Final   Report Status 12/11/2015 FINAL  Final         Radiology Studies: Dg Chest 2 View  12/10/2015  CLINICAL DATA:  Pt sudden onset of fever with tremors. EXAM: CHEST  2 VIEW COMPARISON:  08/19/2005 FINDINGS: The heart size and mediastinal contours are within normal limits. Both lungs are clear. The visualized skeletal structures are unremarkable. IMPRESSION: No active cardiopulmonary disease. Electronically Signed   By: Skipper Cliche M.D.   On: 12/10/2015 12:50        Scheduled Meds: . aspirin  81 mg Oral Daily  . atorvastatin  10 mg Oral q1800  . brimonidine  1  drop Both Eyes BID  . carvedilol  6.25 mg Oral BID WC  . cholecalciferol  2,000 Units Oral Daily  . finasteride  5 mg Oral Daily  . furosemide  40 mg Oral Daily  . hydrALAZINE  50 mg Oral TID  . insulin aspart  0-15 Units Subcutaneous TID WC  . insulin aspart  0-5 Units Subcutaneous QHS  . insulin detemir  30 Units Subcutaneous QHS  . isosorbide mononitrate  120 mg Oral Daily  . latanoprost  1 drop Both Eyes QHS  . magnesium sulfate 1 - 4 g bolus IVPB  4 g Intravenous Once  . multivitamin with minerals  1 tablet  Oral Daily  . piperacillin-tazobactam (ZOSYN)  IV  3.375 g Intravenous Q8H  . prasugrel  10 mg Oral QHS  . tamsulosin  0.4 mg Oral QPC supper  . [START ON 12/12/2015] vancomycin  1,500 mg Intravenous Q48H  . vitamin C  1,000 mg Oral Daily   Continuous Infusions:       Time spent: 40 minutes    Angas Isabell, MD [facility] Pager [phone]  If 7PM-7AM, please contact night-coverage [email or web address] Password [facility] 12/11/2015, 11:51 AM

## 2015-12-11 NOTE — Care Management Note (Signed)
Case Management Note  Patient Details  Name: Ray Merritt MRN: [id number] Date of Birth: 12-May-1936  Subjective/Objective:          80 yo admitted with fever          Action/Plan: From home with spouse.  CM following for DC needs.  Expected Discharge Date:   (UNKNOWN)               Expected Discharge Plan:  Home/Self Care  In-House Referral:     Discharge planning Services  CM Consult  Post Acute Care Choice:    Choice offered to:     DME Arranged:    DME Agency:     HH Arranged:    HH Agency:     Status of Service:  In process, will continue to follow  Medicare Important Message Given:    Date Medicare IM Given:    Medicare IM give by:    Date Additional Medicare IM Given:    Additional Medicare Important Message give by:     If discussed at [place] of Stay Meetings, dates discussed:    Additional CommentsLynnell Catalan, RN 12/11/2015, 2:00 PM  [phone]

## 2015-12-12 DIAGNOSIS — R509 Fever, unspecified: Secondary | ICD-10-CM

## 2015-12-12 DIAGNOSIS — N189 Chronic kidney disease, unspecified: Secondary | ICD-10-CM

## 2015-12-12 LAB — HEMOGLOBIN A1C
HEMOGLOBIN A1C: 7.6 % — AB (ref 4.8–5.6)
MEAN PLASMA GLUCOSE: 171 mg/dL

## 2015-12-12 LAB — PROCALCITONIN: Procalcitonin: 1.17 ng/mL

## 2015-12-12 LAB — C DIFFICILE QUICK SCREEN W PCR REFLEX
C DIFFICILE (CDIFF) TOXIN: NEGATIVE
C Diff antigen: NEGATIVE
C Diff interpretation: NEGATIVE

## 2015-12-12 LAB — GLUCOSE, CAPILLARY
GLUCOSE-CAPILLARY: 119 mg/dL — AB (ref 65–99)
GLUCOSE-CAPILLARY: 263 mg/dL — AB (ref 65–99)
GLUCOSE-CAPILLARY: 222 mg/dL — AB (ref 65–99)
Glucose-Capillary: 192 mg/dL — ABNORMAL HIGH (ref 65–99)

## 2015-12-12 LAB — BASIC METABOLIC PANEL
ANION GAP: 6 (ref 5–15)
BUN: 43 mg/dL — ABNORMAL HIGH (ref 6–20)
CHLORIDE: 108 mmol/L (ref 101–111)
CO2: 21 mmol/L — AB (ref 22–32)
Calcium: 8.5 mg/dL — ABNORMAL LOW (ref 8.9–10.3)
Creatinine, Ser: 2.75 mg/dL — ABNORMAL HIGH (ref 0.61–1.24)
GFR calc non Af Amer: 21 mL/min — ABNORMAL LOW (ref >60)
GFR, EST AFRICAN AMERICAN: 24 mL/min — AB (ref >60)
Glucose, Bld: 147 mg/dL — ABNORMAL HIGH (ref 65–99)
POTASSIUM: 4.2 mmol/L (ref 3.5–5.1)
SODIUM: 135 mmol/L (ref 135–145)

## 2015-12-12 LAB — PATHOLOGIST SMEAR REVIEW

## 2015-12-12 LAB — CBC WITH DIFFERENTIAL/PLATELET
Basophils Absolute: 0 10*3/uL (ref 0.0–0.1)
Basophils Relative: 0 %
EOS ABS: 0.3 10*3/uL (ref 0.0–0.7)
Eosinophils Relative: 2 %
HEMATOCRIT: 29.9 % — AB (ref 39.0–52.0)
HEMOGLOBIN: 10.1 g/dL — AB (ref 13.0–17.0)
LYMPHS ABS: 5.7 10*3/uL — AB (ref 0.7–4.0)
LYMPHS PCT: 30 %
MCH: 29.7 pg (ref 26.0–34.0)
MCHC: 33.8 g/dL (ref 30.0–36.0)
MCV: 87.9 fL (ref 78.0–100.0)
MONOS PCT: 6 %
Monocytes Absolute: 1.1 10*3/uL — ABNORMAL HIGH (ref 0.1–1.0)
NEUTROS ABS: 12 10*3/uL — AB (ref 1.7–7.7)
NEUTROS PCT: 62 %
Platelets: 217 10*3/uL (ref 150–400)
RBC: 3.4 MIL/uL — AB (ref 4.22–5.81)
RDW: 13.9 % (ref 11.5–15.5)
WBC: 19.2 10*3/uL — AB (ref 4.0–10.5)

## 2015-12-12 LAB — MAGNESIUM: Magnesium: 1.9 mg/dL (ref 1.7–2.4)

## 2015-12-12 MED ORDER — ALBUTEROL SULFATE (2.5 MG/3ML) 0.083% IN NEBU
2.5000 mg | INHALATION_SOLUTION | RESPIRATORY_TRACT | Status: DC | PRN
  Administered 2015-12-12 (×2): 2.5 mg via RESPIRATORY_TRACT
  Filled 2015-12-12 (×2): qty 3

## 2015-12-12 MED ORDER — DIPHENHYDRAMINE HCL 25 MG PO CAPS
25.0000 mg | ORAL_CAPSULE | Freq: Once | ORAL | Status: AC
  Administered 2015-12-12: 25 mg via ORAL
  Filled 2015-12-12: qty 1

## 2015-12-12 NOTE — Progress Notes (Signed)
[facility] PROGRESS NOTE    Ray Merritt  [id number] DOB: June 22, 1936 DOA: 12/10/2015 PCP: Antony Blackbird, MD    Assessment & Plan:   Principal Problem:   Fever Active Problems:   Leukocytosis   CLL (chronic lymphocytic leukemia) ([place])   Type II diabetes mellitus, uncontrolled ([place])   Chronic kidney disease (CKD)   Hypertension, essential, benign   Hyperlipidemia   HLD (hyperlipidemia)   BPH (benign prostatic hyperplasia)   FUO (fever of unknown origin)   Type 2 diabetes, HbA1c goal < 7% ([facility])  1 fever of unknown origin/SIRS Chest x-ray negative. Blood cultures no growth to date.  Leukocytosis trending down.  Continue empiric IV Zosyn. IV fluids. Discontinue vancomycin.  Urine culture with multiples bacteria presents. Will re send urine culture.  ECHO; normal EF, akinesis of mid-anteroseptal myocardium.   2 Dehydration NSL.   3 leukocytosis Questionable etiology. Concern for recurrent CLL however Dr Grandville Silos discussed  with Dr. Earlie Server , oncology is felt patient likely has an infectious etiology. Infectious workup negative to date including chest x-ray and urinalysis. Blood cultures pending. Patient denies any diarrhea. Follow.  4 type 2 diabetes mellitus Hemoglobin A1c was 7.6 on 10/17/2015. CBGs have ranged from 146---> 287. Continue Levemir and sliding scale insulin.  5 hyperlipidemia Continue Lipitor.  6 hypertension Continue Coreg, Proscar, Lasix, hydralazine, Imdur.  7 BPH Continue Flomax and proscar  8 CLL in remission Per oncology.  9 chronic kidney disease Stable.    DVT prophylaxis: SCDs Code Status: Full Family Communication: Patient, wife and daughter at bedside. Disposition Plan: Home when medically stable.   Consultants:   Curb sided oncology: Dr. Lorna Few  Procedures:   2-D echo pending 12/11/2015  Chest x-ray 12/10/2015  Antimicrobials:  IV vancomycin 12/10/2015  IV Zosyn 12/10/2015   Subjective: He is feeling better.  Had 2 BM today, first watery second normal.  Mild dry cough. Report mild dyspnea no chest pain.   Objective: Filed Vitals:   12/12/15 0059 12/12/15 0515 12/12/15 1031 12/12/15 1426  BP:  171/65  166/57  Pulse:  70  67  Temp:  99.2 F (37.3 C)  98.7 F (37.1 C)  TempSrc:  Oral  Oral  Resp:  20  20  Height:      Weight:  107.004 kg (235 lb 14.4 oz)    SpO2: 98% 98% 98% 99%    Intake/Output Summary (Last 24 hours) at 12/12/15 1434 Last data filed at 12/12/15 1427  Gross per 24 hour  Intake    720 ml  Output   1150 ml  Net   -430 ml   Filed Weights   12/10/15 1449 12/11/15 0517 12/12/15 0515  Weight: 111.131 kg (245 lb) 106.958 kg (235 lb 12.8 oz) 107.004 kg (235 lb 14.4 oz)    Examination:  General exam: Appears calm and comfortable  Respiratory system: Clear to auscultation. Respiratory effort normal. Cardiovascular system: S1 & S2 heard, RRR. No JVD, murmurs, rubs, gallops or clicks. No pedal edema. Gastrointestinal system: Abdomen is Mildly distended, soft and nontender. No organomegaly or masses felt. Normal bowel sounds heard. Central nervous system: Alert and oriented. No focal neurological deficits. Extremities: Symmetric 5 x 5 power. Skin: No rashes, lesions or ulcers Psychiatry: Judgement and insight appear normal. Mood & affect appropriate.     Data Reviewed: I have personally reviewed following labs and imaging studies  CBC:  Recent Labs Lab 12/10/15 1101 12/11/15 0351 12/12/15 0341  WBC 17.4* 20.9* 19.2*  NEUTROABS 9.7* 14.0*  12.0*  HGB 10.7* 9.6* 10.1*  HCT 31.4* 29.0* 29.9*  MCV 86.5 89.0 87.9  PLT 224 200 [id number]   Basic Metabolic Panel:  Recent Labs Lab 12/10/15 1101 12/10/15 2140 12/11/15 0351 12/11/15 0826 12/12/15 0341  NA 136  --  133*  --  135  K 4.5  --  4.1  --  4.2  CL 108  --  105  --  108  CO2 20*  --  20*  --  21*  GLUCOSE 206*  --  193*  --  147*  BUN 56*  --  49*  --  43*  CREATININE 2.72*  --  2.88*  --  2.75*  CALCIUM  9.0  --  8.0*  --  8.5*  MG  --  1.5*  --  1.5* 1.9  PHOS  --  2.1*  --   --   --    GFR: Estimated Creatinine Clearance: 29.8 mL/min (by C-G formula based on Cr of 2.75). Liver Function Tests:  Recent Labs Lab 12/10/15 1101  AST 20  ALT 26  ALKPHOS 56  BILITOT 0.7  PROT 6.6  ALBUMIN 4.1   No results for input(s): LIPASE, AMYLASE in the last 168 hours. No results for input(s): AMMONIA in the last 168 hours. Coagulation Profile: No results for input(s): INR, PROTIME in the last 168 hours. Cardiac Enzymes: No results for input(s): CKTOTAL, CKMB, CKMBINDEX, TROPONINI in the last 168 hours. BNP (last 3 results) No results for input(s): PROBNP in the last 8760 hours. HbA1C:  Recent Labs  12/10/15 2140  HGBA1C 7.6*   CBG:  Recent Labs Lab 12/11/15 0728 12/11/15 1127 12/11/15 1741 12/11/15 2121 12/12/15 0731  GLUCAP 146* 287* 251* 213* 119*   Lipid Profile: No results for input(s): CHOL, HDL, LDLCALC, TRIG, CHOLHDL, LDLDIRECT in the last 72 hours. Thyroid Function Tests:  Recent Labs  12/10/15 2140  TSH 0.441   Anemia Panel:  Recent Labs  12/11/15 0826  VITAMINB12 596  FOLATE 23.0  FERRITIN 78  TIBC 262  IRON 17*  RETICCTPCT 1.5   Sepsis Labs:  Recent Labs Lab 12/10/15 1114 12/10/15 1415 12/10/15 1422 12/12/15 0341  PROCALCITON  --  0.46  --  1.17  LATICACIDVEN 2.02*  --  0.96  --     Recent Results (from the past 240 hour(s))  Blood Culture (routine x 2)     Status: None (Preliminary result)   Collection Time: 12/10/15 11:01 AM  Result Value Ref Range Status   Specimen Description BLOOD RIGHT ANTECUBITAL  Final   Special Requests BOTTLES DRAWN AEROBIC AND ANAEROBIC 5ML  Final   Culture   Final    NO GROWTH 1 DAY Performed at [facility]    Report Status PENDING  Incomplete  Blood Culture (routine x 2)     Status: None (Preliminary result)   Collection Time: 12/10/15 11:05 AM  Result Value Ref Range Status   Specimen  Description BLOOD LEFT FOREARM  Final   Special Requests BOTTLES DRAWN AEROBIC AND ANAEROBIC 5ML  Final   Culture   Final    NO GROWTH 1 DAY Performed at [facility]    Report Status PENDING  Incomplete  Urine culture     Status: Abnormal   Collection Time: 12/10/15 12:08 PM  Result Value Ref Range Status   Specimen Description URINE, CLEAN CATCH  Final   Special Requests NONE  Final   Culture MULTIPLE SPECIES PRESENT, SUGGEST RECOLLECTION (A)  Final  Report Status 12/11/2015 FINAL  Final         Radiology Studies: No results found.      Scheduled Meds: . aspirin  81 mg Oral Daily  . atorvastatin  10 mg Oral q1800  . brimonidine  1 drop Both Eyes BID  . carvedilol  6.25 mg Oral BID WC  . cholecalciferol  2,000 Units Oral Daily  . finasteride  5 mg Oral Daily  . furosemide  40 mg Oral Daily  . hydrALAZINE  50 mg Oral TID  . insulin aspart  0-15 Units Subcutaneous TID WC  . insulin aspart  0-5 Units Subcutaneous QHS  . insulin detemir  30 Units Subcutaneous QHS  . isosorbide mononitrate  120 mg Oral Daily  . latanoprost  1 drop Both Eyes QHS  . multivitamin with minerals  1 tablet Oral Daily  . piperacillin-tazobactam (ZOSYN)  IV  3.375 g Intravenous Q8H  . prasugrel  10 mg Oral QHS  . tamsulosin  0.4 mg Oral QPC supper  . vitamin C  1,000 mg Oral Daily   Continuous Infusions:    LOS: 1 day    Time spent: 25 minutes    Elmarie Shiley, MD [facility] Pager [phone]  If 7PM-7AM, please contact night-coverage [email or web address] Password [facility] 12/12/2015, 2:34 PM

## 2015-12-12 NOTE — Progress Notes (Signed)
Inpatient Diabetes Program Recommendations  AACE/ADA: New Consensus Statement on Inpatient Glycemic Control (2015)  Target Ranges:  Prepandial:   less than 140 mg/dL      Peak postprandial:   less than 180 mg/dL (1-2 hours)      Critically ill patients:  140 - 180 mg/dL   Review of Glycemic Control  Inpatient Diabetes Program Recommendations:      Results for RANNY, HERTENSTEIN (MRN [id number]) as of 12/12/2015 14:07  Ref. Range 12/10/2015 21:16 12/11/2015 07:28 12/11/2015 11:27 12/11/2015 17:41 12/11/2015 21:21 12/12/2015 07:31  Glucose-Capillary Latest Ref Range: 65-99 mg/dL 233 (H) 3 units correction 146 (H) 2 units correction 287 (H) 8 units correction 251 (H) 8 units correction 213 (H) 2 units correction 119 (H) No correction neded   Fasting glucose levels are controlled. Please consider addition of meal coverage of 3 units tidwc, as he is getting typically 8 units correction before lunch and supper and still remains in the 200 mg/dL range.  Thank you Rosita Kea, RN, MSN, CDE  Diabetes Inpatient Program Office: [phone] Pager: [phone] 8:00 am to 5:00 pm

## 2015-12-13 LAB — CBC
HCT: 28.5 % — ABNORMAL LOW (ref 39.0–52.0)
HEMOGLOBIN: 9.6 g/dL — AB (ref 13.0–17.0)
MCH: 29.5 pg (ref 26.0–34.0)
MCHC: 33.7 g/dL (ref 30.0–36.0)
MCV: 87.7 fL (ref 78.0–100.0)
Platelets: 208 10*3/uL (ref 150–400)
RBC: 3.25 MIL/uL — AB (ref 4.22–5.81)
RDW: 13.6 % (ref 11.5–15.5)
WBC: 13.9 10*3/uL — ABNORMAL HIGH (ref 4.0–10.5)

## 2015-12-13 LAB — BASIC METABOLIC PANEL
ANION GAP: 6 (ref 5–15)
BUN: 41 mg/dL — ABNORMAL HIGH (ref 6–20)
CALCIUM: 8.8 mg/dL — AB (ref 8.9–10.3)
CO2: 23 mmol/L (ref 22–32)
Chloride: 108 mmol/L (ref 101–111)
Creatinine, Ser: 2.56 mg/dL — ABNORMAL HIGH (ref 0.61–1.24)
GFR calc Af Amer: 26 mL/min — ABNORMAL LOW (ref >60)
GFR calc non Af Amer: 22 mL/min — ABNORMAL LOW (ref >60)
GLUCOSE: 114 mg/dL — AB (ref 65–99)
POTASSIUM: 3.9 mmol/L (ref 3.5–5.1)
Sodium: 137 mmol/L (ref 135–145)

## 2015-12-13 LAB — GLUCOSE, CAPILLARY: Glucose-Capillary: 107 mg/dL — ABNORMAL HIGH (ref 65–99)

## 2015-12-13 MED ORDER — CIPROFLOXACIN HCL 500 MG PO TABS
500.0000 mg | ORAL_TABLET | Freq: Every day | ORAL | Status: DC
  Administered 2015-12-13: 500 mg via ORAL
  Filled 2015-12-13: qty 1

## 2015-12-13 MED ORDER — CIPROFLOXACIN HCL 500 MG PO TABS: 500.0000 mg | ORAL_TABLET | Freq: Every day | ORAL | Status: DC

## 2015-12-13 MED ORDER — INSULIN NPH (HUMAN) (ISOPHANE) 100 UNIT/ML ~~LOC~~ SUSP: 15.0000 [IU] | Freq: Two times a day (BID) | SUBCUTANEOUS | Status: DC

## 2015-12-13 MED ORDER — NOVOLIN R 100 UNIT/ML IJ SOLN: 4.0000 [IU] | Freq: Two times a day (BID) | INTRAMUSCULAR | Status: DC

## 2015-12-13 NOTE — Discharge Summary (Addendum)
Physician Discharge Summary  Ray Merritt [id number] DOB: 06/30/1936 DOA: 12/10/2015  PCP: Antony Blackbird, MD  Admit date: 12/10/2015 Discharge date: 12/13/2015  Time spent: 35 minutes  Recommendations for Outpatient Follow-up:  Please follow urine culture results.  Needs cbc and renal function check.   Discharge Diagnoses:    Sepsis   UTI   Leukocytosis   CLL (chronic lymphocytic leukemia) (HCC)   Type II diabetes mellitus, uncontrolled (HCC)   Chronic kidney disease (CKD)   Hypertension, essential, benign   Hyperlipidemia   HLD (hyperlipidemia)   BPH (benign prostatic hyperplasia)   FUO (fever of unknown origin)   Type 2 diabetes, HbA1c goal < 7% ([facility])   Discharge Condition: stable   Diet recommendation: heart healthy   Filed Weights   12/11/15 0517 12/12/15 0515 12/13/15 0530  Weight: 106.958 kg (235 lb 12.8 oz) 107.004 kg (235 lb 14.4 oz) 103.193 kg (227 lb 8 oz)    History of present illness:  HPI: Ray Merritt is a 80 y.o. male with PMH significant for with hx of CLL (in remission); HTN, HLD, CAD, AOCD, uncontrolled diabetes mellitus and CKD stage 3; who presented to [facility] complaining of general malaise, fatigue and fever. Patient found to have temp of 103 and just feeling bad. No CP, no nausea, no vomiting, no abd pain, no dysuria, no hematuria, no melena or hematochezia. There was no aggravated or alleviating factor.   ED Course: CXR neg for acute abnormalities; UA no suggesting UTI. Patient had pan-cultures taken; started on IVF's and received empiric IV antibiotics. Positive SIRS on admission (elevated lactic acid, temp of 103 and WBC's 17K); no source for infection appreciated currently.  Hospital Course:  1-Sepsis presume UTI SIRS criteria.  Chest x-ray negative. Blood cultures no growth to date. Leukocytosis trending down.  Received  IV Zosyn while in the hospital . IV fluids. Discontinue vancomycin.  Urine culture with multiples bacteria presents. Will  re send urine culture.  ECHO; normal EF, akinesis of mid-anteroseptal myocardium. Follow up with primary cardiologist  He will be discharge on cipro for 3 more days.  C diff negative.   2 Dehydration NSL.   3 leukocytosis Questionable etiology. Concern for recurrent CLL however Dr Grandville Silos discussed with Dr. Earlie Server , oncology is felt patient likely has an infectious etiology. Infectious workup negative to date including chest x-ray and urinalysis. Blood cultures no growth to date.  Trending down on antibiotics.   4 type 2 diabetes mellitus Hemoglobin A1c was 7.6 on 10/17/2015. CBGs have ranged from 146---> 287.  Resume home regimen lower dose.   5 hyperlipidemia Continue Lipitor.  6 hypertension Continue Coreg, Proscar, Lasix, hydralazine, Imdur.  7 BPH Continue Flomax and proscar  8 CLL in remission Per oncology.  9 chronic kidney disease Stable. Needs to follow up with primary nephrology   Procedures:  none  Consultations:  none  Discharge Exam: Filed Vitals:   12/12/15 2155 12/13/15 0530  BP: 172/69 173/68  Pulse: 62 62  Temp: 98 F (36.7 C) 97.9 F (36.6 C)  Resp: 20 20    General: NAD Cardiovascular: S 1, S 2 RRR Respiratory: CTA  Discharge Instructions   Discharge Instructions    Diet - low sodium heart healthy    Complete by:  As directed      Increase activity slowly    Complete by:  As directed           Discharge Medication List as of 12/13/2015 11:34 AM  START taking these medications   Details  ciprofloxacin (CIPRO) 500 MG tablet Take 1 tablet (500 mg total) by mouth daily with breakfast., Starting 12/13/2015, Until Discontinued, Print      CONTINUE these medications which have CHANGED   Details  insulin NPH Human (HUMULIN N,NOVOLIN N) 100 UNIT/ML injection Inject 0.15 mLs (15 Units total) into the skin 2 (two) times daily before a meal., Starting 12/13/2015, Until Discontinued, Print    NOVOLIN R 100 UNIT/ML injection Inject  0.04 mLs (4 Units total) into the skin 2 (two) times daily before a meal., Starting 12/13/2015, Until Discontinued, Print      CONTINUE these medications which have NOT CHANGED   Details  ALPHAGAN P 0.1 % SOLN Place 1 drop into both eyes 2 (two) times daily., Starting 10/17/2015, Until Discontinued, Historical Med    Ascorbic Acid (VITAMIN C) 1000 MG tablet Take 1,000 mg by mouth daily., Until Discontinued, Historical Med    aspirin 81 MG tablet Take 81 mg by mouth daily., Until Discontinued, Historical Med    atorvastatin (LIPITOR) 10 MG tablet Take 10 mg by mouth daily., Until Discontinued, Historical Med    carvedilol (COREG) 6.25 MG tablet Take 6.25 mg by mouth 2 (two) times daily with a meal., Starting 11/11/2015, Until Discontinued, Historical Med    Cholecalciferol (VITAMIN D) 2000 units tablet Take 2,000 Units by mouth daily., Until Discontinued, Historical Med    diphenhydramine-acetaminophen (TYLENOL PM) 25-500 MG TABS tablet Take 2 tablets by mouth at bedtime., Until Discontinued, Historical Med    finasteride (PROSCAR) 5 MG tablet Take 5 mg by mouth daily., Until Discontinued, Historical Med    furosemide (LASIX) 40 MG tablet Take 40 mg by mouth daily., Until Discontinued, Historical Med    glucose blood (BAYER CONTOUR NEXT TEST) test strip Use as instructed to check blood sugar 2 times per day dx code E11.65, Print    hydrALAZINE (APRESOLINE) 50 MG tablet Take 50 mg by mouth 3 (three) times daily., Starting 04/25/2015, Until Discontinued, Historical Med    isosorbide mononitrate (IMDUR) 120 MG 24 hr tablet Take 120 mg by mouth daily., Until Discontinued, Historical Med    Multiple Vitamin (MULTIVITAMIN WITH MINERALS) TABS tablet Take 1 tablet by mouth daily., Until Discontinued, Historical Med    prasugrel (EFFIENT) 10 MG TABS tablet Take 1 tablet (10 mg total) by mouth daily., Starting 05/24/2015, Until Discontinued, Normal    tamsulosin (FLOMAX) 0.4 MG CAPS capsule Take 0.4  mg by mouth daily after supper., Until Discontinued, Historical Med    Travoprost, BAK Free, (TRAVATAN) 0.004 % SOLN ophthalmic solution Place 1 drop into both eyes at bedtime., Until Discontinued, Historical Med    triamcinolone cream (KENALOG) 0.1 % Apply 1 application topically daily. , Starting 10/12/2014, Until Discontinued, Historical Med       No Known Allergies Follow-up Information    Follow up with [facility] K., MD In 3 days.   Specialty:  Oncology   Contact information:   [address] [place] [address] [phone]        The results of significant diagnostics from this hospitalization (including imaging, microbiology, ancillary and laboratory) are listed below for reference.    Significant Diagnostic Studies: Dg Chest 2 View  12/10/2015  CLINICAL DATA:  Pt sudden onset of fever with tremors. EXAM: CHEST  2 VIEW COMPARISON:  08/19/2005 FINDINGS: The heart size and mediastinal contours are within normal limits. Both lungs are clear. The visualized skeletal structures are unremarkable. IMPRESSION: No active  cardiopulmonary disease. Electronically Signed   By: Skipper Cliche M.D.   On: 12/10/2015 12:50    Microbiology: Recent Results (from the past 240 hour(s))  Blood Culture (routine x 2)     Status: None (Preliminary result)   Collection Time: 12/10/15 11:01 AM  Result Value Ref Range Status   Specimen Description BLOOD RIGHT ANTECUBITAL  Final   Special Requests BOTTLES DRAWN AEROBIC AND ANAEROBIC 5ML  Final   Culture   Final    NO GROWTH 2 DAYS Performed at [facility]    Report Status PENDING  Incomplete  Blood Culture (routine x 2)     Status: None (Preliminary result)   Collection Time: 12/10/15 11:05 AM  Result Value Ref Range Status   Specimen Description BLOOD LEFT FOREARM  Final   Special Requests BOTTLES DRAWN AEROBIC AND ANAEROBIC 5ML  Final   Culture   Final    NO GROWTH 2 DAYS Performed at [facility]    Report  Status PENDING  Incomplete  Urine culture     Status: Abnormal   Collection Time: 12/10/15 12:08 PM  Result Value Ref Range Status   Specimen Description URINE, CLEAN CATCH  Final   Special Requests NONE  Final   Culture MULTIPLE SPECIES PRESENT, SUGGEST RECOLLECTION (A)  Final   Report Status 12/11/2015 FINAL  Final  C difficile quick scan w PCR reflex     Status: None   Collection Time: 12/12/15  4:37 PM  Result Value Ref Range Status   C Diff antigen NEGATIVE NEGATIVE Final   C Diff toxin NEGATIVE NEGATIVE Final   C Diff interpretation Negative for toxigenic C. difficile  Final     Labs: Basic Metabolic Panel:  Recent Labs Lab 12/10/15 1101 12/10/15 2140 12/11/15 0351 12/11/15 0826 12/12/15 0341 12/13/15 0708  NA 136  --  133*  --  135 137  K 4.5  --  4.1  --  4.2 3.9  CL 108  --  105  --  108 108  CO2 20*  --  20*  --  21* 23  GLUCOSE 206*  --  193*  --  147* 114*  BUN 56*  --  49*  --  43* 41*  CREATININE 2.72*  --  2.88*  --  2.75* 2.56*  CALCIUM 9.0  --  8.0*  --  8.5* 8.8*  MG  --  1.5*  --  1.5* 1.9  --   PHOS  --  2.1*  --   --   --   --    Liver Function Tests:  Recent Labs Lab 12/10/15 1101  AST 20  ALT 26  ALKPHOS 56  BILITOT 0.7  PROT 6.6  ALBUMIN 4.1   No results for input(s): LIPASE, AMYLASE in the last 168 hours. No results for input(s): AMMONIA in the last 168 hours. CBC:  Recent Labs Lab 12/10/15 1101 12/11/15 0351 12/12/15 0341 12/13/15 0708  WBC 17.4* 20.9* 19.2* 13.9*  NEUTROABS 9.7* 14.0* 12.0*  --   HGB 10.7* 9.6* 10.1* 9.6*  HCT 31.4* 29.0* 29.9* 28.5*  MCV 86.5 89.0 87.9 87.7  PLT 224 200 217 208   Cardiac Enzymes: No results for input(s): CKTOTAL, CKMB, CKMBINDEX, TROPONINI in the last 168 hours. BNP: BNP (last 3 results) No results for input(s): BNP in the last 8760 hours.  ProBNP (last 3 results) No results for input(s): PROBNP in the last 8760 hours.  CBG:  Recent Labs Lab 12/12/15 0731 12/12/15  1147  12/12/15 1732 12/12/15 2145 12/13/15 0749  GLUCAP 119* 263* 192* 222* 107*       Signed:  Niel Hummer A MD.  [facility] 12/13/2015, 3:41 PM

## 2015-12-13 NOTE — Progress Notes (Signed)
Nursing Discharge Summary  Patient ID: Ray Merritt MRN: [id number] DOB/AGE: Apr 06, 1936 80 y.o.  Admit date: 12/10/2015 Discharge date: 12/13/2015  Discharged Condition: good  Disposition: 01-Home or Self Care  Follow-up Information    Follow up with [facility] K., MD In 3 days.   Specialty:  Oncology   Contact information:   [place] [address] [phone]       Prescriptions Given: Patient received prescriptions for insulin NPH, insulin regular, and Cipro.  Patient follow up appointments and medications discussed.  Patient and family verbalized understanding.   Means of Discharge: Patient taken downstairs via wheelchair to be discharged home.  Patient  Signed: Buel Ream 12/13/2015, 12:24 PM

## 2015-12-13 NOTE — Progress Notes (Signed)
Inpatient Diabetes Program Recommendations  AACE/ADA: New Consensus Statement on Inpatient Glycemic Control (2015)  Target Ranges:  Prepandial:   less than 140 mg/dL      Peak postprandial:   less than 180 mg/dL (1-2 hours)      Critically ill patients:  140 - 180 mg/dL   Results for OTHMAR, FACTOR (MRN [id number]) as of 12/13/2015 08:49  Ref. Range 12/12/2015 07:31 12/12/2015 11:47 12/12/2015 17:32 12/12/2015 21:45  Glucose-Capillary Latest Ref Range: 65-99 mg/dL 119 (H) 263 (H) 192 (H) 222 (H)    Home DM Meds: NPH insulin- 15 units AM/ 28 units PM       Regular insulin- 6 units bid (per pt report)   Current Insulin Orders: Levemir 30 units QHS      Novolog Moderate Correction Scale/ SSI (0-15 units) TID AC + HS      MD- Note patient is eating 75-100% of meals.  Having elevated postprandial glucose levels.  Takes Regular insulin at home with meals.  Please consider starting Novolog Meal Coverage- Novolog 3 units tid with meals (hold if pt eats <50% of meal)      --Will follow patient during hospitalization--  Wyn Quaker RN, MSN, CDE Diabetes Coordinator Inpatient Glycemic Control Team Team Pager: [phone] (8a-5p)

## 2015-12-14 LAB — URINE CULTURE: Culture: NO GROWTH

## 2015-12-15 LAB — CULTURE, BLOOD (ROUTINE X 2)
Culture: NO GROWTH
Culture: NO GROWTH

## 2015-12-18 ENCOUNTER — Encounter: Payer: Self-pay | Admitting: Internal Medicine

## 2015-12-18 ENCOUNTER — Telehealth: Payer: Self-pay | Admitting: Internal Medicine

## 2015-12-18 ENCOUNTER — Other Ambulatory Visit (HOSPITAL_BASED_OUTPATIENT_CLINIC_OR_DEPARTMENT_OTHER): Payer: Medicare Other

## 2015-12-18 ENCOUNTER — Ambulatory Visit (HOSPITAL_BASED_OUTPATIENT_CLINIC_OR_DEPARTMENT_OTHER): Payer: Medicare Other | Admitting: Internal Medicine

## 2015-12-18 VITALS — BP 175/65 | HR 60 | Temp 97.9°F | Resp 18 | Ht 75.0 in | Wt 246.2 lb

## 2015-12-18 DIAGNOSIS — N189 Chronic kidney disease, unspecified: Secondary | ICD-10-CM

## 2015-12-18 DIAGNOSIS — D509 Iron deficiency anemia, unspecified: Secondary | ICD-10-CM | POA: Diagnosis not present

## 2015-12-18 DIAGNOSIS — C9110 Chronic lymphocytic leukemia of B-cell type not having achieved remission: Secondary | ICD-10-CM | POA: Diagnosis not present

## 2015-12-18 DIAGNOSIS — D72829 Elevated white blood cell count, unspecified: Secondary | ICD-10-CM

## 2015-12-18 LAB — CBC WITH DIFFERENTIAL/PLATELET
BASO%: 0.2 % (ref 0.0–2.0)
BASOS ABS: 0 10*3/uL (ref 0.0–0.1)
EOS ABS: 0.4 10*3/uL (ref 0.0–0.5)
EOS%: 2.7 % (ref 0.0–7.0)
HEMATOCRIT: 29.4 % — AB (ref 38.4–49.9)
HEMOGLOBIN: 9.7 g/dL — AB (ref 13.0–17.1)
LYMPH%: 45.1 % (ref 14.0–49.0)
MCH: 29.1 pg (ref 27.2–33.4)
MCHC: 33 g/dL (ref 32.0–36.0)
MCV: 88.3 fL (ref 79.3–98.0)
MONO#: 0.8 10*3/uL (ref 0.1–0.9)
MONO%: 6.2 % (ref 0.0–14.0)
NEUT%: 45.8 % (ref 39.0–75.0)
NEUTROS ABS: 5.9 10*3/uL (ref 1.5–6.5)
Platelets: 290 10*3/uL (ref 140–400)
RBC: 3.33 10*6/uL — ABNORMAL LOW (ref 4.20–5.82)
RDW: 13.4 % (ref 11.0–14.6)
WBC: 13 10*3/uL — AB (ref 4.0–10.3)
lymph#: 5.9 10*3/uL — ABNORMAL HIGH (ref 0.9–3.3)

## 2015-12-18 LAB — LACTATE DEHYDROGENASE: LDH: 209 U/L (ref 125–245)

## 2015-12-18 LAB — TECHNOLOGIST REVIEW

## 2015-12-18 NOTE — Progress Notes (Signed)
[facility] [phone]   [phone]  OFFICE PROGRESS NOTE  FULP, CAMMIE, MD [address] [address]  DIAGNOSIS: Chronic lymphocytic leukemia diagnosed in July 2015  PRIOR THERAPY: None  CURRENT THERAPY: Observation.  INTERVAL HISTORY: Ray Merritt 80 y.o. male returns to the clinic today for follow-up visit accompanied by 2 family members. The patient was diagnosed with chronic lymphocytic leukemia in July 2015. He has been observation since that time with no significant complaints. He was recently admitted to [facility] with fever and questionable urinary tract infection. He was treated with a course of Cipro. His total white blood count was elevated during his hospitalization. He completed the course of antibiotics 2 days ago. He is feeling fine today and denied having any significant fatigue or weakness. He denied having any weight loss or night sweats. The patient denied having any chest pain, shortness of breath, cough or hemoptysis. He has no palpable lymphadenopathy. Anemia panel during his hospitalization showed evidence for iron deficiency anemia. He is here today for evaluation and repeat blood work.  MEDICAL HISTORY: Past Medical History  Diagnosis Date  . Enlarged prostate   . Hypertension   . Anemia   . Coronary artery disease   . Diabetes mellitus     insulin dependent   . Chronic kidney disease   . Arthritis     ALLERGIES:  has No Known Allergies.  MEDICATIONS:  Current Outpatient Prescriptions  Medication Sig Dispense Refill  . ALPHAGAN P 0.1 % SOLN Place 1 drop into both eyes 2 (two) times daily.  0  . Ascorbic Acid (VITAMIN C) 1000 MG tablet Take 1,000 mg by mouth daily.    Marland Kitchen aspirin 81 MG tablet Take 81 mg by mouth daily.    Marland Kitchen atorvastatin (LIPITOR) 10 MG tablet Take 10 mg by mouth daily.    . carvedilol (COREG) 6.25 MG tablet Take 6.25 mg by mouth 2 (two) times daily with a meal.  1  .  Cholecalciferol (VITAMIN D) 2000 units tablet Take 2,000 Units by mouth daily.    . ciprofloxacin (CIPRO) 500 MG tablet Take 1 tablet (500 mg total) by mouth daily with breakfast. 3 tablet 0  . diphenhydramine-acetaminophen (TYLENOL PM) 25-500 MG TABS tablet Take 2 tablets by mouth at bedtime.    . finasteride (PROSCAR) 5 MG tablet Take 5 mg by mouth daily.    . furosemide (LASIX) 40 MG tablet Take 40 mg by mouth daily.    Marland Kitchen glucose blood (BAYER CONTOUR NEXT TEST) test strip Use as instructed to check blood sugar 2 times per day dx code E11.65 (Patient taking differently: 1 each by Other route See admin instructions. Check blood sugar 3 times daily) 100 each 12  . hydrALAZINE (APRESOLINE) 50 MG tablet Take 50 mg by mouth 3 (three) times daily.  0  . insulin NPH Human (HUMULIN N,NOVOLIN N) 100 UNIT/ML injection Inject 0.15 mLs (15 Units total) into the skin 2 (two) times daily before a meal. 10 mL 0  . isosorbide mononitrate (IMDUR) 120 MG 24 hr tablet Take 120 mg by mouth daily.    . Multiple Vitamin (MULTIVITAMIN WITH MINERALS) TABS tablet Take 1 tablet by mouth daily.    Marland Kitchen NOVOLIN R 100 UNIT/ML injection Inject 0.04 mLs (4 Units total) into the skin 2 (two) times daily before a meal. 10 mL 0  . prasugrel (EFFIENT) 10 MG TABS tablet Take 1 tablet (10 mg  total) by mouth daily. (Patient taking differently: Take 10 mg by mouth at bedtime. ) 30 tablet 0  . tamsulosin (FLOMAX) 0.4 MG CAPS capsule Take 0.4 mg by mouth daily after supper.    . Travoprost, BAK Free, (TRAVATAN) 0.004 % SOLN ophthalmic solution Place 1 drop into both eyes at bedtime.    . triamcinolone cream (KENALOG) 0.1 % Apply 1 application topically daily.   0   No current facility-administered medications for this visit.    SURGICAL HISTORY:  Past Surgical History  Procedure Laterality Date  . Shoulder surgery    . Cardiac catheterization N/A 05/01/2015    Procedure: Left Heart Cath and Coronary Angiography;  Surgeon: Adrian Prows,  MD;  Location: [facility];  Service: Cardiovascular;  Laterality: N/A;  . Cardiac catheterization  05/01/2015    Procedure: Intravascular Pressure Wire/FFR Study;  Surgeon: Adrian Prows, MD;  Location: [facility];  Service: Cardiovascular;;  . Cardiac catheterization N/A 05/22/2015    Procedure: Left Heart Cath and Coronary Angiography;  Surgeon: Adrian Prows, MD;  Location: [facility];  Service: Cardiovascular;  Laterality: N/A;  . Cardiac catheterization N/A 05/22/2015    Procedure: Coronary Stent Intervention;  Surgeon: Adrian Prows, MD;  Location: [facility];  Service: Cardiovascular;  Laterality: N/A;    REVIEW OF SYSTEMS:  A comprehensive review of systems was negative except for: Constitutional: positive for fatigue   PHYSICAL EXAMINATION: General appearance: alert, cooperative and no distress Head: Normocephalic, without obvious abnormality, atraumatic Neck: no adenopathy, no JVD, supple, symmetrical, trachea midline and thyroid not enlarged, symmetric, no tenderness/mass/nodules Lymph nodes: Cervical, supraclavicular, and axillary nodes normal. Resp: clear to auscultation bilaterally Back: symmetric, no curvature. ROM normal. No CVA tenderness. Cardio: regular rate and rhythm, S1, S2 normal, no murmur, click, rub or gallop GI: soft, non-tender; bowel sounds normal; no masses,  no organomegaly Extremities: extremities normal, atraumatic, no cyanosis or edema Neurologic: Alert and oriented X 3, normal strength and tone. Normal symmetric reflexes. Normal coordination and gait  ECOG PERFORMANCE STATUS: 1 - Symptomatic but completely ambulatory  Blood pressure 175/65, pulse 60, temperature 97.9 F (36.6 C), temperature source Oral, resp. rate 18, height 6\' 3"  (1.905 m), weight 246 lb 3.2 oz (111.676 kg), SpO2 99 %.  LABORATORY DATA: Lab Results  Component Value Date   WBC 13.0* 12/18/2015   HGB 9.7* 12/18/2015   HCT 29.4* 12/18/2015   MCV 88.3 12/18/2015    PLT 290 12/18/2015      Chemistry      Component Value Date/Time   NA 137 12/13/2015 0708   NA 139 06/19/2015 0954   K 3.9 12/13/2015 0708   K 4.9 06/19/2015 0954   CL 108 12/13/2015 0708   CO2 23 12/13/2015 0708   CO2 19* 06/19/2015 0954   BUN 41* 12/13/2015 0708   BUN 40.7* 06/19/2015 0954   CREATININE 2.56* 12/13/2015 0708   CREATININE 2.7* 06/19/2015 0954      Component Value Date/Time   CALCIUM 8.8* 12/13/2015 0708   CALCIUM 9.1 06/19/2015 0954   ALKPHOS 56 12/10/2015 1101   ALKPHOS 61 06/19/2015 0954   AST 20 12/10/2015 1101   AST 13 06/19/2015 0954   ALT 26 12/10/2015 1101   ALT 15 06/19/2015 0954   BILITOT 0.7 12/10/2015 1101   BILITOT <0.30 06/19/2015 0954       RADIOGRAPHIC STUDIES: Dg Chest 2 View  12/10/2015  CLINICAL DATA:  Pt sudden onset of fever with tremors.  EXAM: CHEST  2 VIEW COMPARISON:  08/19/2005 FINDINGS: The heart size and mediastinal contours are within normal limits. Both lungs are clear. The visualized skeletal structures are unremarkable. IMPRESSION: No active cardiopulmonary disease. Electronically Signed   By: Skipper Cliche M.D.   On: 12/10/2015 12:50    ASSESSMENT AND PLAN: This is a very pleasant 80 years old African-American male diagnosed with chronic lymphocytic leukemia and currently on observation. The patient is feeling fine today with no specific complaints except for fatigue.  His absolute leukocyte count as well as lymphocyte count has decreased compared to his recent hospitalization and the patient was most likely secondary to his infection. I discussed the lab result with the patient. I recommended for him to continue on observation with repeat CBC, comprehensive metabolic panel and LDH in 3 months. For the iron deficiency anemia, I strongly recommended for the patient to start taking over-the-counter iron tablets 1-2 tablets every day with vitamin C. He was advised to call immediately if he has any concerning symptoms in the  interval. The patient voices understanding of current disease status and treatment options and is in agreement with the current care plan.  All questions were answered. The patient knows to call the clinic with any problems, questions or concerns. We can certainly see the patient much sooner if necessary.  Disclaimer: This note was dictated with voice recognition software. Similar sounding words can inadvertently be transcribed and may not be corrected upon review.

## 2015-12-18 NOTE — Telephone Encounter (Signed)
Gave and printed appt sched and avs for pt for Aug °

## 2015-12-27 DIAGNOSIS — Z794 Long term (current) use of insulin: Secondary | ICD-10-CM | POA: Diagnosis not present

## 2015-12-27 DIAGNOSIS — E1122 Type 2 diabetes mellitus with diabetic chronic kidney disease: Secondary | ICD-10-CM | POA: Diagnosis not present

## 2016-01-03 DIAGNOSIS — N184 Chronic kidney disease, stage 4 (severe): Secondary | ICD-10-CM | POA: Diagnosis not present

## 2016-01-03 DIAGNOSIS — N189 Chronic kidney disease, unspecified: Secondary | ICD-10-CM | POA: Diagnosis not present

## 2016-01-03 DIAGNOSIS — E7800 Pure hypercholesterolemia, unspecified: Secondary | ICD-10-CM | POA: Diagnosis not present

## 2016-01-03 DIAGNOSIS — I25119 Atherosclerotic heart disease of native coronary artery with unspecified angina pectoris: Secondary | ICD-10-CM | POA: Diagnosis not present

## 2016-01-21 ENCOUNTER — Other Ambulatory Visit (INDEPENDENT_AMBULATORY_CARE_PROVIDER_SITE_OTHER): Payer: Medicare Other

## 2016-01-21 DIAGNOSIS — Z794 Long term (current) use of insulin: Secondary | ICD-10-CM | POA: Diagnosis not present

## 2016-01-21 DIAGNOSIS — E1165 Type 2 diabetes mellitus with hyperglycemia: Secondary | ICD-10-CM | POA: Diagnosis not present

## 2016-01-21 LAB — BASIC METABOLIC PANEL
BUN: 60 mg/dL — ABNORMAL HIGH (ref 6–23)
CHLORIDE: 110 meq/L (ref 96–112)
CO2: 20 meq/L (ref 19–32)
Calcium: 9.6 mg/dL (ref 8.4–10.5)
Creatinine, Ser: 2.68 mg/dL — ABNORMAL HIGH (ref 0.40–1.50)
GFR: 24.62 mL/min — ABNORMAL LOW (ref >60.00)
Glucose, Bld: 102 mg/dL — ABNORMAL HIGH (ref 70–99)
Potassium: 4.4 mEq/L (ref 3.5–5.1)
SODIUM: 138 meq/L (ref 135–145)

## 2016-01-21 LAB — HEMOGLOBIN A1C: HEMOGLOBIN A1C: 7.3 % — AB (ref 4.6–6.5)

## 2016-01-25 ENCOUNTER — Ambulatory Visit: Payer: Medicare Other | Admitting: Endocrinology

## 2016-02-06 DIAGNOSIS — E785 Hyperlipidemia, unspecified: Secondary | ICD-10-CM | POA: Diagnosis not present

## 2016-02-13 ENCOUNTER — Encounter: Payer: Self-pay | Admitting: Endocrinology

## 2016-02-13 ENCOUNTER — Ambulatory Visit (INDEPENDENT_AMBULATORY_CARE_PROVIDER_SITE_OTHER): Payer: Medicare Other | Admitting: Endocrinology

## 2016-02-13 VITALS — BP 130/60 | HR 60 | Wt 239.0 lb

## 2016-02-13 DIAGNOSIS — I25119 Atherosclerotic heart disease of native coronary artery with unspecified angina pectoris: Secondary | ICD-10-CM | POA: Diagnosis not present

## 2016-02-13 DIAGNOSIS — E1165 Type 2 diabetes mellitus with hyperglycemia: Secondary | ICD-10-CM | POA: Diagnosis not present

## 2016-02-13 DIAGNOSIS — Z794 Long term (current) use of insulin: Secondary | ICD-10-CM

## 2016-02-13 DIAGNOSIS — E1122 Type 2 diabetes mellitus with diabetic chronic kidney disease: Secondary | ICD-10-CM | POA: Diagnosis not present

## 2016-02-13 DIAGNOSIS — I5032 Chronic diastolic (congestive) heart failure: Secondary | ICD-10-CM | POA: Diagnosis not present

## 2016-02-13 NOTE — Progress Notes (Signed)
Patient ID: Ray Merritt, male   DOB: 1935-11-12, 80 y.o.   MRN: [id number]           Reason for Appointment: Follow-up for Type 2 Diabetes  Referring physician: Fulp  History of Present Illness:          Date of diagnosis of type 2 diabetes mellitus : 1982        Background history:   He has been on insulin since the time of diagnosis, initially his blood sugars were significantly high ; he was symptomatic with dry mouth and blurred vision. He thinks he was probably taking NPH and Regular Insulin for quite some time and before his consultation was only on NPH 3 years ago he was put on Bydureon weekly in addition to NPH.  He thinks that it initially curb his appetite but he does not think he lost any significant amount of weight.  Also he does not think his blood sugar control was much better For about a year he had been switched from Bydureon to Tanzeum weekly  His  Tanzeum was stopped because of lack of benefit A1c has been as high as 11.8 in 2/16  Recent history:   INSULIN regimen is : Novolin NPH 15 units acb and 26 units bedtime, Regular Insulin 6 units at breakfast and 7 supper   On his initial consultation because of high postprandial readings he was started on Regular Insulin before each meal He was also seen by nurse educator for basic diabetes education   A1c is relatively higher at better at 7.3 although has been as low as 6.8  Current blood sugar patterns and problems identified:  He did  bring his monitor for review but could not be downloaded today  He has checked blood sugars mostly before meals and usually not after supper  His blood sugars are averaging on 120 more recently but A1c indicates probably higher average  Fasting blood sugars are fairly steady and only occasionally mildly increased  Blood sugars are variable at lunchtime and no documented hypoglycemia but he thinks that occasionally he feels the sugar getting low especially if he is active; he is  trying to get a late morning snack if being active like mowing the lawn  Blood sugars appear to be low normal at suppertime with highest reading only 117 recently  Has only a couple of readings later in the evening possibly after supper which are also near normal  Has difficulty losing weight and this is despite trying to be active  Other hypoglycemic drugs the patient is taking are:   none Side effects from medications have been: None  Compliance with the medical regimen: Good Hypoglycemia: with activity symptoms are weakness and feeling sweaty, treated with juice or other sweet drinks, usually can recognize blood sugars in the low 60s   Glucose monitoring:  done 3 times a day         Glucometer:  Contour      Blood Glucose readings by  review of his monitor  Mean values apply above for all meters except median for One Touch  PRE-MEAL Fasting Lunch Dinner 7-8 PM  Overall  Glucose range: 107-168  73-164  67-117  73-1 24    Mean/median:     119    Self-care: The diet that the patient has been following is: tries to limit portions .     Typical meal intake: Breakfast is old-fashioned oatmeal, lunch is usually a sandwich.  He has mostly  popcorn for snacks   Dinner time is usually 6-7              Dietician visit, most recent: none  CDE consultation: 7/16               Exercise: walking or gardening, More active in summer     Weight history:  Wt Readings from Last 3 Encounters:  02/13/16 239 lb (108.4 kg)  12/18/15 246 lb 3.2 oz (111.7 kg)  12/13/15 227 lb 8 oz (103.2 kg)    Glycemic control:   Lab Results  Component Value Date   HGBA1C 7.3 (H) 01/21/2016   HGBA1C 7.6 (H) 12/10/2015   HGBA1C 7.6 (H) 10/17/2015   Lab Results  Component Value Date   MICROALBUR 65.6 (H) 06/05/2015   LDLCALC 79 06/05/2015   CREATININE 2.68 (H) 01/21/2016   Microalbumin/creatinine ratio was 378 done on 12/20/14      Medication List       Accurate as of 02/13/16  3:41 PM. Always use  your most recent med list.          ALPHAGAN P 0.1 % Soln Generic drug:  brimonidine Place 1 drop into both eyes 2 (two) times daily.   aspirin 81 MG tablet Take 81 mg by mouth daily.   atorvastatin 10 MG tablet Commonly known as:  LIPITOR Take 20 mg by mouth daily.   carvedilol 6.25 MG tablet Commonly known as:  COREG Take 25 mg by mouth 2 (two) times daily with a meal.   ciprofloxacin 500 MG tablet Commonly known as:  CIPRO Take 1 tablet (500 mg total) by mouth daily with breakfast.   diphenhydramine-acetaminophen 25-500 MG Tabs tablet Commonly known as:  TYLENOL PM Take 2 tablets by mouth at bedtime.   finasteride 5 MG tablet Commonly known as:  PROSCAR Take 5 mg by mouth daily.   furosemide 40 MG tablet Commonly known as:  LASIX Take 40 mg by mouth daily.   glucose blood test strip Commonly known as:  BAYER CONTOUR NEXT TEST Use as instructed to check blood sugar 2 times per day dx code E11.65   hydrALAZINE 50 MG tablet Commonly known as:  APRESOLINE Take 50 mg by mouth 3 (three) times daily.   insulin NPH Human 100 UNIT/ML injection Commonly known as:  HUMULIN N,NOVOLIN N Inject 0.15 mLs (15 Units total) into the skin 2 (two) times daily before a meal.   isosorbide mononitrate 120 MG 24 hr tablet Commonly known as:  IMDUR Take 120 mg by mouth daily.   minoxidil 2.5 MG tablet Commonly known as:  LONITEN Take 2.5 mg by mouth daily.   multivitamin with minerals Tabs tablet Take 1 tablet by mouth daily.   NOVOLIN R 100 units/mL injection Generic drug:  insulin regular Inject 0.04 mLs (4 Units total) into the skin 2 (two) times daily before a meal.   prasugrel 10 MG Tabs tablet Commonly known as:  EFFIENT Take 1 tablet (10 mg total) by mouth daily.   tamsulosin 0.4 MG Caps capsule Commonly known as:  FLOMAX Take 0.4 mg by mouth daily after supper.   Travoprost (BAK Free) 0.004 % Soln ophthalmic solution Commonly known as:  TRAVATAN Place 1 drop  into both eyes at bedtime.   triamcinolone cream 0.1 % Commonly known as:  KENALOG Apply 1 application topically daily.   vitamin C 1000 MG tablet Take 1,000 mg by mouth daily.   Vitamin D 2000 units tablet Take 2,000 Units by  mouth daily.       Allergies: No Known Allergies  Past Medical History:  Diagnosis Date  . Anemia   . Arthritis   . Chronic kidney disease   . Coronary artery disease   . Diabetes mellitus    insulin dependent   . Enlarged prostate   . Hypertension     Past Surgical History:  Procedure Laterality Date  . CARDIAC CATHETERIZATION N/A 05/01/2015   Procedure: Left Heart Cath and Coronary Angiography;  Surgeon: Adrian Prows, MD;  Location: [facility];  Service: Cardiovascular;  Laterality: N/A;  . CARDIAC CATHETERIZATION  05/01/2015   Procedure: Intravascular Pressure Wire/FFR Study;  Surgeon: Adrian Prows, MD;  Location: [facility];  Service: Cardiovascular;;  . CARDIAC CATHETERIZATION N/A 05/22/2015   Procedure: Left Heart Cath and Coronary Angiography;  Surgeon: Adrian Prows, MD;  Location: [facility];  Service: Cardiovascular;  Laterality: N/A;  . CARDIAC CATHETERIZATION N/A 05/22/2015   Procedure: Coronary Stent Intervention;  Surgeon: Adrian Prows, MD;  Location: [facility];  Service: Cardiovascular;  Laterality: N/A;  . SHOULDER SURGERY      Family History  Problem Relation Age of Onset  . Hypertension Mother   . Heart disease Sister   . Stroke Sister   . Diabetes Brother     Social History:  reports that he has quit smoking. He has never used smokeless tobacco. He reports that he does not drink alcohol or use drugs.    Review of Systems   THYROID nodule: No recent difficulty swallowing  He has a dominant left-sided thyroid nodule and did have a biopsy done Results as follows: BENIGN FOLLICULAR CELLS, COLLOID AND MACROPHAGES, CONSISTENT WITH  BENIGN GOITROUS NODULE   Lipid history: Has been on medication for a  few years with Lipitor   Lab Results  Component Value Date   CHOL 137 06/05/2015   HDL 39.60 06/05/2015   LDLCALC 79 06/05/2015   TRIG 91.0 06/05/2015   CHOLHDL 3 06/05/2015          Eyes: Marland Kitchen  Most recent eye exam was in 3/17  Diabetic foot exam done in 7/16  RENAL dysfunction: He is followed by nephrologist And the Creatinine level is variable   Lab Results  Component Value Date   CREATININE 2.68 (H) 01/21/2016   CREATININE 2.56 (H) 12/13/2015   CREATININE 2.75 (H) 12/12/2015   HYPERTENSION: This is followed by cardiologist and nephrologist as well as PCP  Physical Examination:  BP 130/60 (BP Location: Right Arm, Patient Position: Sitting)   Pulse 60   Wt 239 lb (108.4 kg)   SpO2 96%   BMI 29.87 kg/m        ASSESSMENT:  Diabetes type 2, uncontrolled    See history of present illness for detailed discussion of his current management, blood sugar patterns and problems identified  Overall has good control on NPH twice a day and Regular Insulin at breakfast and supper. Despite his obesity is taking relatively low doses of insulin possibly from his renal failure He prefers to take NPH and regular for affordability  He is checking his blood sugar right before eating meals and not clear if he may have post prandial hyperglycemia His A1c is still relatively higher at 7.3 although slightly better  Appears to be needing most of his insulin at bedtime with the dose of 26 units NPH He has not had high readings at suppertime but usually not checking sugars 2 hours after  lunch He thinks he may feel hypoglycemic at lunchtime if he is more active Also currently suppertime readings are low normal indicating relatively high doses of NPH in the morning and less basal insulin in the morning Not checking readings after supper  HYPERTENSION:  Controlled, follow-up with cardiologist  PLAN:   He will need to reduce his morning NPH by 2 units and take 13  Check more readings in  between meals and especially more after supper and less fasting  Midmorning snack been more active  Have a protein at every meal  Patient Instructions  N insulin 13 units in am  Check blood sugars on waking up 3-4  times a week  Also check blood sugars about 2 hours after a meal and do this after different meals by rotation  Recommended blood sugar levels on waking up is 90-130 and about 2 hours after meal is 130-160  Please bring your blood sugar monitor to each visit, thank you    Counseling time on subjects discussed above is over 50% of today's 25 minute visit   Enrrique Mierzwa 02/13/2016, 3:41 PM   Note: This office note was prepared with Dragon voice recognition system technology. Any transcriptional errors that result from this process are unintentional.

## 2016-02-13 NOTE — Patient Instructions (Signed)
N insulin 13 units in am  Check blood sugars on waking up 3-4  times a week  Also check blood sugars about 2 hours after a meal and do this after different meals by rotation  Recommended blood sugar levels on waking up is 90-130 and about 2 hours after meal is 130-160  Please bring your blood sugar monitor to each visit, thank you

## 2016-03-11 ENCOUNTER — Telehealth: Payer: Self-pay | Admitting: Internal Medicine

## 2016-03-11 ENCOUNTER — Ambulatory Visit (HOSPITAL_BASED_OUTPATIENT_CLINIC_OR_DEPARTMENT_OTHER): Payer: Medicare Other | Admitting: Internal Medicine

## 2016-03-11 ENCOUNTER — Encounter: Payer: Self-pay | Admitting: Internal Medicine

## 2016-03-11 ENCOUNTER — Telehealth: Payer: Self-pay | Admitting: Medical Oncology

## 2016-03-11 ENCOUNTER — Other Ambulatory Visit (HOSPITAL_BASED_OUTPATIENT_CLINIC_OR_DEPARTMENT_OTHER): Payer: Medicare Other

## 2016-03-11 VITALS — BP 153/49 | HR 59 | Temp 98.4°F | Resp 17 | Ht 75.0 in | Wt 250.8 lb

## 2016-03-11 DIAGNOSIS — Z8579 Personal history of other malignant neoplasms of lymphoid, hematopoietic and related tissues: Secondary | ICD-10-CM | POA: Diagnosis not present

## 2016-03-11 DIAGNOSIS — D72829 Elevated white blood cell count, unspecified: Secondary | ICD-10-CM

## 2016-03-11 DIAGNOSIS — D509 Iron deficiency anemia, unspecified: Secondary | ICD-10-CM | POA: Diagnosis not present

## 2016-03-11 DIAGNOSIS — C9110 Chronic lymphocytic leukemia of B-cell type not having achieved remission: Secondary | ICD-10-CM

## 2016-03-11 LAB — CBC WITH DIFFERENTIAL/PLATELET
BASO%: 0.5 % (ref 0.0–2.0)
Basophils Absolute: 0.1 10*3/uL (ref 0.0–0.1)
EOS ABS: 0.4 10*3/uL (ref 0.0–0.5)
EOS%: 3.5 % (ref 0.0–7.0)
HCT: 31.1 % — ABNORMAL LOW (ref 38.4–49.9)
HEMOGLOBIN: 10 g/dL — AB (ref 13.0–17.1)
LYMPH%: 50.9 % — ABNORMAL HIGH (ref 14.0–49.0)
MCH: 28.9 pg (ref 27.2–33.4)
MCHC: 32.3 g/dL (ref 32.0–36.0)
MCV: 89.4 fL (ref 79.3–98.0)
MONO#: 0.8 10*3/uL (ref 0.1–0.9)
MONO%: 7 % (ref 0.0–14.0)
NEUT%: 38.1 % — ABNORMAL LOW (ref 39.0–75.0)
NEUTROS ABS: 4.1 10*3/uL (ref 1.5–6.5)
Platelets: 226 10*3/uL (ref 140–400)
RBC: 3.47 10*6/uL — ABNORMAL LOW (ref 4.20–5.82)
RDW: 14.5 % (ref 11.0–14.6)
WBC: 10.8 10*3/uL — AB (ref 4.0–10.3)
lymph#: 5.5 10*3/uL — ABNORMAL HIGH (ref 0.9–3.3)

## 2016-03-11 LAB — COMPREHENSIVE METABOLIC PANEL
ALBUMIN: 3.5 g/dL (ref 3.5–5.0)
ALK PHOS: 55 U/L (ref 40–150)
ALT: 17 U/L (ref 0–55)
AST: 13 U/L (ref 5–34)
Anion Gap: 8 mEq/L (ref 3–11)
BUN: 58.5 mg/dL — AB (ref 7.0–26.0)
CO2: 19 mEq/L — ABNORMAL LOW (ref 22–29)
CREATININE: 3.2 mg/dL — AB (ref 0.7–1.3)
Calcium: 9.2 mg/dL (ref 8.4–10.4)
Chloride: 109 mEq/L (ref 98–109)
EGFR: 18 mL/min/{1.73_m2} — AB (ref >90)
GLUCOSE: 239 mg/dL — AB (ref 70–140)
Potassium: 5 mEq/L (ref 3.5–5.1)
SODIUM: 136 meq/L (ref 136–145)
TOTAL PROTEIN: 6.3 g/dL — AB (ref 6.4–8.3)

## 2016-03-11 LAB — LACTATE DEHYDROGENASE: LDH: 241 U/L (ref 125–245)

## 2016-03-11 LAB — TECHNOLOGIST REVIEW

## 2016-03-11 NOTE — Telephone Encounter (Signed)
Faxed cmet to Cammie Fulp.

## 2016-03-11 NOTE — Progress Notes (Signed)
[facility] [phone]   Fax:([phone]  OFFICE PROGRESS NOTE  FULP, CAMMIE, MD [address] [place] [address]  DIAGNOSIS: Chronic lymphocytic leukemia diagnosed in July 2015  PRIOR THERAPY: None  CURRENT THERAPY: Observation.  INTERVAL HISTORY: Ray Merritt 80 y.o. male returns to the clinic today for follow-up visit. The patient is feeling fine today with no specific complaints. He denied having any significant chest pain but has shortness breath with exertion was no cough or hemoptysis. He denied having any fever or chills. He has no nausea or vomiting. He has no significant weight loss or night sweats. He continues to have mild swelling of the lower extremities and he is currently on diuretic by his primary care physician. He is also on multiple medications for his high blood pressure. He had repeat CBC performed earlier today and he is here for evaluation and discussion of his lab results.  MEDICAL HISTORY: Past Medical History:  Diagnosis Date  . Anemia   . Arthritis   . Chronic kidney disease   . Coronary artery disease   . Diabetes mellitus    insulin dependent   . Enlarged prostate   . Hypertension     ALLERGIES:  has No Known Allergies.  MEDICATIONS:  Current Outpatient Prescriptions  Medication Sig Dispense Refill  . ALPHAGAN P 0.1 % SOLN Place 1 drop into both eyes 2 (two) times daily.  0  . Ascorbic Acid (VITAMIN C) 1000 MG tablet Take 1,000 mg by mouth daily.    Marland Kitchen aspirin 81 MG tablet Take 81 mg by mouth daily.    Marland Kitchen atorvastatin (LIPITOR) 10 MG tablet Take 20 mg by mouth daily.     . carvedilol (COREG) 6.25 MG tablet Take 25 mg by mouth 2 (two) times daily with a meal.   1  . Cholecalciferol (VITAMIN D) 2000 units tablet Take 2,000 Units by mouth daily.    . cloNIDine (CATAPRES) 0.1 MG tablet Take 0.1 mg by mouth 2 (two) times daily.  0  . diphenhydramine-acetaminophen (TYLENOL PM) 25-500 MG TABS tablet Take 2  tablets by mouth at bedtime.    . Ferrous Sulfate (IRON) 325 (65 Fe) MG TABS Take 1 tablet by mouth daily.    . finasteride (PROSCAR) 5 MG tablet Take 5 mg by mouth daily.    . furosemide (LASIX) 40 MG tablet Take 40 mg by mouth daily.    Marland Kitchen glucose blood (BAYER CONTOUR NEXT TEST) test strip Use as instructed to check blood sugar 2 times per day dx code E11.65 (Patient taking differently: 1 each by Other route See admin instructions. Check blood sugar 3 times daily) 100 each 12  . hydrALAZINE (APRESOLINE) 50 MG tablet Take 50 mg by mouth 3 (three) times daily.  0  . insulin NPH Human (HUMULIN N,NOVOLIN N) 100 UNIT/ML injection Inject 0.15 mLs (15 Units total) into the skin 2 (two) times daily before a meal. (Patient taking differently: Inject 15 Units into the skin 2 (two) times daily before a meal. ) 10 mL 0  . isosorbide mononitrate (IMDUR) 120 MG 24 hr tablet Take 120 mg by mouth daily.    . minoxidil (LONITEN) 2.5 MG tablet Take 2.5 mg by mouth daily.    . Multiple Vitamin (MULTIVITAMIN WITH MINERALS) TABS tablet Take 1 tablet by mouth daily.    Marland Kitchen NOVOLIN R 100 UNIT/ML injection Inject 0.04 mLs (4 Units total) into the skin 2 (two) times daily  before a meal. (Patient taking differently: Inject 26 Units into the skin 2 (two) times daily before a meal. ) 10 mL 0  . Omega-3 Fatty Acids (FISH OIL) 1200 MG CAPS Take 1 capsule by mouth daily.    . prasugrel (EFFIENT) 10 MG TABS tablet Take 1 tablet (10 mg total) by mouth daily. (Patient taking differently: Take 10 mg by mouth at bedtime. ) 30 tablet 0  . tamsulosin (FLOMAX) 0.4 MG CAPS capsule Take 0.4 mg by mouth daily after supper.    . Travoprost, BAK Free, (TRAVATAN) 0.004 % SOLN ophthalmic solution Place 1 drop into both eyes at bedtime.    . triamcinolone cream (KENALOG) 0.1 % Apply 1 application topically daily.   0   No current facility-administered medications for this visit.     SURGICAL HISTORY:  Past Surgical History:  Procedure  Laterality Date  . CARDIAC CATHETERIZATION N/A 05/01/2015   Procedure: Left Heart Cath and Coronary Angiography;  Surgeon: Adrian Prows, MD;  Location: [facility];  Service: Cardiovascular;  Laterality: N/A;  . CARDIAC CATHETERIZATION  05/01/2015   Procedure: Intravascular Pressure Wire/FFR Study;  Surgeon: Adrian Prows, MD;  Location: [facility];  Service: Cardiovascular;;  . CARDIAC CATHETERIZATION N/A 05/22/2015   Procedure: Left Heart Cath and Coronary Angiography;  Surgeon: Adrian Prows, MD;  Location: [facility];  Service: Cardiovascular;  Laterality: N/A;  . CARDIAC CATHETERIZATION N/A 05/22/2015   Procedure: Coronary Stent Intervention;  Surgeon: Adrian Prows, MD;  Location: [facility];  Service: Cardiovascular;  Laterality: N/A;  . SHOULDER SURGERY      REVIEW OF SYSTEMS:  A comprehensive review of systems was negative except for: Constitutional: positive for fatigue Respiratory: positive for dyspnea on exertion   PHYSICAL EXAMINATION: General appearance: alert, cooperative and no distress Head: Normocephalic, without obvious abnormality, atraumatic Neck: no adenopathy, no JVD, supple, symmetrical, trachea midline and thyroid not enlarged, symmetric, no tenderness/mass/nodules Lymph nodes: Cervical, supraclavicular, and axillary nodes normal. Resp: clear to auscultation bilaterally Back: symmetric, no curvature. ROM normal. No CVA tenderness. Cardio: regular rate and rhythm, S1, S2 normal, no murmur, click, rub or gallop GI: soft, non-tender; bowel sounds normal; no masses,  no organomegaly Extremities: extremities normal, atraumatic, no cyanosis or edema Neurologic: Alert and oriented X 3, normal strength and tone. Normal symmetric reflexes. Normal coordination and gait  ECOG PERFORMANCE STATUS: 1 - Symptomatic but completely ambulatory  Blood pressure (!) 153/49, pulse (!) 59, temperature 98.4 F (36.9 C), temperature source Oral, resp. rate 17, height 6\' 3"   (1.905 m), weight 250 lb 12.8 oz (113.8 kg), SpO2 98 %.  LABORATORY DATA: Lab Results  Component Value Date   WBC 10.8 (H) 03/11/2016   HGB 10.0 (L) 03/11/2016   HCT 31.1 (L) 03/11/2016   MCV 89.4 03/11/2016   PLT 226 03/11/2016      Chemistry      Component Value Date/Time   NA 138 01/21/2016 0853   NA 139 06/19/2015 0954   K 4.4 01/21/2016 0853   K 4.9 06/19/2015 0954   CL 110 01/21/2016 0853   CO2 20 01/21/2016 0853   CO2 19 (L) 06/19/2015 0954   BUN 60 (H) 01/21/2016 0853   BUN 40.7 (H) 06/19/2015 0954   CREATININE 2.68 (H) 01/21/2016 0853   CREATININE 2.7 (H) 06/19/2015 0954      Component Value Date/Time   CALCIUM 9.6 01/21/2016 0853   CALCIUM 9.1 06/19/2015 0954   ALKPHOS 56 12/10/2015 1101  ALKPHOS 61 06/19/2015 0954   AST 20 12/10/2015 1101   AST 13 06/19/2015 0954   ALT 26 12/10/2015 1101   ALT 15 06/19/2015 0954   BILITOT 0.7 12/10/2015 1101   BILITOT <0.30 06/19/2015 0954       RADIOGRAPHIC STUDIES: No results found.  ASSESSMENT AND PLAN: This is a very pleasant 80 years old African-American male diagnosed with chronic lymphocytic leukemia and currently on observation. The patient is feeling fine today with no specific complaints except for fatigueAnd shortness breath with exertion.  CBC today showed continuous decrease in the total white blood count and currently 0.8 I recommended for him to continue on observation with repeat CBC, comprehensive metabolic panel and LDH in 6 months. For the iron deficiency anemia, he will continue on over-the-counter iron tablets 1-2 tablets every day with vitamin C. He was advised to call immediately if he has any concerning symptoms in the interval. The patient voices understanding of current disease status and treatment options and is in agreement with the current care plan.  All questions were answered. The patient knows to call the clinic with any problems, questions or concerns. We can certainly see the patient  much sooner if necessary.  Disclaimer: This note was dictated with voice recognition software. Similar sounding words can inadvertently be transcribed and may not be corrected upon review.

## 2016-03-11 NOTE — Telephone Encounter (Signed)
Gave patient avs report and appointments for February.  °

## 2016-03-27 DIAGNOSIS — E1122 Type 2 diabetes mellitus with diabetic chronic kidney disease: Secondary | ICD-10-CM | POA: Diagnosis not present

## 2016-03-27 DIAGNOSIS — I25119 Atherosclerotic heart disease of native coronary artery with unspecified angina pectoris: Secondary | ICD-10-CM | POA: Diagnosis not present

## 2016-03-27 DIAGNOSIS — E1165 Type 2 diabetes mellitus with hyperglycemia: Secondary | ICD-10-CM | POA: Diagnosis not present

## 2016-03-27 DIAGNOSIS — N184 Chronic kidney disease, stage 4 (severe): Secondary | ICD-10-CM | POA: Diagnosis not present

## 2016-04-01 ENCOUNTER — Inpatient Hospital Stay (HOSPITAL_COMMUNITY)
Admission: EM | Admit: 2016-04-01 | Discharge: 2016-04-05 | DRG: 871 | Disposition: A | Discharge status: Home / Self Care | Payer: Medicare Other | Attending: Internal Medicine | Admitting: Internal Medicine

## 2016-04-01 ENCOUNTER — Observation Stay (HOSPITAL_COMMUNITY): Payer: Medicare Other

## 2016-04-01 ENCOUNTER — Encounter (HOSPITAL_COMMUNITY): Payer: Self-pay | Admitting: Emergency Medicine

## 2016-04-01 ENCOUNTER — Emergency Department (HOSPITAL_COMMUNITY): Payer: Medicare Other

## 2016-04-01 DIAGNOSIS — R0602 Shortness of breath: Secondary | ICD-10-CM | POA: Diagnosis not present

## 2016-04-01 DIAGNOSIS — N281 Cyst of kidney, acquired: Secondary | ICD-10-CM | POA: Diagnosis present

## 2016-04-01 DIAGNOSIS — J189 Pneumonia, unspecified organism: Secondary | ICD-10-CM | POA: Diagnosis not present

## 2016-04-01 DIAGNOSIS — C9110 Chronic lymphocytic leukemia of B-cell type not having achieved remission: Secondary | ICD-10-CM | POA: Diagnosis not present

## 2016-04-01 DIAGNOSIS — Z955 Presence of coronary angioplasty implant and graft: Secondary | ICD-10-CM

## 2016-04-01 DIAGNOSIS — E785 Hyperlipidemia, unspecified: Secondary | ICD-10-CM | POA: Diagnosis present

## 2016-04-01 DIAGNOSIS — R0600 Dyspnea, unspecified: Secondary | ICD-10-CM

## 2016-04-01 DIAGNOSIS — Z833 Family history of diabetes mellitus: Secondary | ICD-10-CM

## 2016-04-01 DIAGNOSIS — R509 Fever, unspecified: Secondary | ICD-10-CM | POA: Diagnosis not present

## 2016-04-01 DIAGNOSIS — N179 Acute kidney failure, unspecified: Secondary | ICD-10-CM | POA: Diagnosis not present

## 2016-04-01 DIAGNOSIS — IMO0002 Reserved for concepts with insufficient information to code with codable children: Secondary | ICD-10-CM | POA: Diagnosis present

## 2016-04-01 DIAGNOSIS — A419 Sepsis, unspecified organism: Secondary | ICD-10-CM | POA: Diagnosis not present

## 2016-04-01 DIAGNOSIS — N184 Chronic kidney disease, stage 4 (severe): Secondary | ICD-10-CM | POA: Diagnosis present

## 2016-04-01 DIAGNOSIS — R05 Cough: Secondary | ICD-10-CM | POA: Diagnosis not present

## 2016-04-01 DIAGNOSIS — N400 Enlarged prostate without lower urinary tract symptoms: Secondary | ICD-10-CM | POA: Diagnosis present

## 2016-04-01 DIAGNOSIS — Z6830 Body mass index (BMI) 30.0-30.9, adult: Secondary | ICD-10-CM

## 2016-04-01 DIAGNOSIS — D72829 Elevated white blood cell count, unspecified: Secondary | ICD-10-CM

## 2016-04-01 DIAGNOSIS — R2681 Unsteadiness on feet: Secondary | ICD-10-CM | POA: Diagnosis present

## 2016-04-01 DIAGNOSIS — E669 Obesity, unspecified: Secondary | ICD-10-CM | POA: Diagnosis present

## 2016-04-01 DIAGNOSIS — N189 Chronic kidney disease, unspecified: Secondary | ICD-10-CM

## 2016-04-01 DIAGNOSIS — N390 Urinary tract infection, site not specified: Secondary | ICD-10-CM | POA: Diagnosis present

## 2016-04-01 DIAGNOSIS — E872 Acidosis: Secondary | ICD-10-CM | POA: Diagnosis present

## 2016-04-01 DIAGNOSIS — R6510 Systemic inflammatory response syndrome (SIRS) of non-infectious origin without acute organ dysfunction: Secondary | ICD-10-CM | POA: Diagnosis not present

## 2016-04-01 DIAGNOSIS — E119 Type 2 diabetes mellitus without complications: Secondary | ICD-10-CM

## 2016-04-01 DIAGNOSIS — Z23 Encounter for immunization: Secondary | ICD-10-CM

## 2016-04-01 DIAGNOSIS — I5033 Acute on chronic diastolic (congestive) heart failure: Secondary | ICD-10-CM | POA: Diagnosis not present

## 2016-04-01 DIAGNOSIS — I130 Hypertensive heart and chronic kidney disease with heart failure and stage 1 through stage 4 chronic kidney disease, or unspecified chronic kidney disease: Secondary | ICD-10-CM | POA: Diagnosis present

## 2016-04-01 DIAGNOSIS — J40 Bronchitis, not specified as acute or chronic: Secondary | ICD-10-CM | POA: Diagnosis present

## 2016-04-01 DIAGNOSIS — E1165 Type 2 diabetes mellitus with hyperglycemia: Secondary | ICD-10-CM | POA: Diagnosis present

## 2016-04-01 DIAGNOSIS — E1122 Type 2 diabetes mellitus with diabetic chronic kidney disease: Secondary | ICD-10-CM | POA: Diagnosis present

## 2016-04-01 DIAGNOSIS — Z8249 Family history of ischemic heart disease and other diseases of the circulatory system: Secondary | ICD-10-CM

## 2016-04-01 DIAGNOSIS — Z823 Family history of stroke: Secondary | ICD-10-CM

## 2016-04-01 DIAGNOSIS — I2510 Atherosclerotic heart disease of native coronary artery without angina pectoris: Secondary | ICD-10-CM | POA: Diagnosis present

## 2016-04-01 DIAGNOSIS — Z7982 Long term (current) use of aspirin: Secondary | ICD-10-CM

## 2016-04-01 DIAGNOSIS — C9111 Chronic lymphocytic leukemia of B-cell type in remission: Secondary | ICD-10-CM | POA: Diagnosis present

## 2016-04-01 DIAGNOSIS — Z87891 Personal history of nicotine dependence: Secondary | ICD-10-CM

## 2016-04-01 DIAGNOSIS — D509 Iron deficiency anemia, unspecified: Secondary | ICD-10-CM | POA: Diagnosis present

## 2016-04-01 DIAGNOSIS — Z794 Long term (current) use of insulin: Secondary | ICD-10-CM

## 2016-04-01 HISTORY — DX: Malignant (primary) neoplasm, unspecified: C80.1

## 2016-04-01 LAB — COMPREHENSIVE METABOLIC PANEL
ALBUMIN: 4.4 g/dL (ref 3.5–5.0)
ALK PHOS: 55 U/L (ref 38–126)
ALT: 24 U/L (ref 17–63)
AST: 18 U/L (ref 15–41)
Anion gap: 9 (ref 5–15)
BILIRUBIN TOTAL: 0.8 mg/dL (ref 0.3–1.2)
BUN: 81 mg/dL — AB (ref 6–20)
CALCIUM: 9.6 mg/dL (ref 8.9–10.3)
CO2: 20 mmol/L — ABNORMAL LOW (ref 22–32)
CREATININE: 3.23 mg/dL — AB (ref 0.61–1.24)
Chloride: 112 mmol/L — ABNORMAL HIGH (ref 101–111)
GFR calc Af Amer: 20 mL/min — ABNORMAL LOW (ref >60)
GFR, EST NON AFRICAN AMERICAN: 17 mL/min — AB (ref >60)
GLUCOSE: 74 mg/dL (ref 65–99)
Potassium: 4.7 mmol/L (ref 3.5–5.1)
Sodium: 141 mmol/L (ref 135–145)
TOTAL PROTEIN: 7.2 g/dL (ref 6.5–8.1)

## 2016-04-01 LAB — URINALYSIS, ROUTINE W REFLEX MICROSCOPIC
BILIRUBIN URINE: NEGATIVE
Glucose, UA: NEGATIVE mg/dL
Hgb urine dipstick: NEGATIVE
Ketones, ur: NEGATIVE mg/dL
LEUKOCYTES UA: NEGATIVE
NITRITE: NEGATIVE
PH: 5 (ref 5.0–8.0)
PROTEIN: 30 mg/dL — AB
Specific Gravity, Urine: 1.013 (ref 1.005–1.030)

## 2016-04-01 LAB — URINE MICROSCOPIC-ADD ON
Bacteria, UA: NONE SEEN
RBC / HPF: NONE SEEN RBC/hpf (ref 0–5)
Squamous Epithelial / LPF: NONE SEEN
URINE-OTHER: NONE SEEN
WBC, UA: NONE SEEN WBC/hpf (ref 0–5)

## 2016-04-01 LAB — TROPONIN I: Troponin I: 0.03 ng/mL (ref <0.03)

## 2016-04-01 LAB — CBC WITH DIFFERENTIAL/PLATELET
BASOS ABS: 0 10*3/uL (ref 0.0–0.1)
Basophils Relative: 0 %
Eosinophils Absolute: 0.2 10*3/uL (ref 0.0–0.7)
Eosinophils Relative: 1 %
HEMATOCRIT: 31.8 % — AB (ref 39.0–52.0)
Hemoglobin: 10.7 g/dL — ABNORMAL LOW (ref 13.0–17.0)
LYMPHS ABS: 7.2 10*3/uL — AB (ref 0.7–4.0)
Lymphocytes Relative: 37 %
MCH: 29.5 pg (ref 26.0–34.0)
MCHC: 33.6 g/dL (ref 30.0–36.0)
MCV: 87.6 fL (ref 78.0–100.0)
MONO ABS: 1.2 10*3/uL — AB (ref 0.1–1.0)
Monocytes Relative: 6 %
NEUTROS ABS: 10.9 10*3/uL — AB (ref 1.7–7.7)
Neutrophils Relative %: 56 %
PLATELETS: 211 10*3/uL (ref 150–400)
RBC: 3.63 MIL/uL — AB (ref 4.22–5.81)
RDW: 13.7 % (ref 11.5–15.5)
WBC: 19.5 10*3/uL — AB (ref 4.0–10.5)

## 2016-04-01 LAB — I-STAT CHEM 8, ED
BUN: 71 mg/dL — AB (ref 6–20)
CALCIUM ION: 1.2 mmol/L (ref 1.15–1.40)
CHLORIDE: 111 mmol/L (ref 101–111)
CREATININE: 3.5 mg/dL — AB (ref 0.61–1.24)
GLUCOSE: 71 mg/dL (ref 65–99)
HCT: 32 % — ABNORMAL LOW (ref 39.0–52.0)
Hemoglobin: 10.9 g/dL — ABNORMAL LOW (ref 13.0–17.0)
Potassium: 4.7 mmol/L (ref 3.5–5.1)
Sodium: 140 mmol/L (ref 135–145)
TCO2: 19 mmol/L (ref 0–100)

## 2016-04-01 LAB — I-STAT CG4 LACTIC ACID, ED: Lactic Acid, Venous: 1.46 mmol/L (ref 0.5–1.9)

## 2016-04-01 LAB — GLUCOSE, CAPILLARY: Glucose-Capillary: 80 mg/dL (ref 65–99)

## 2016-04-01 LAB — CG4 I-STAT (LACTIC ACID): LACTIC ACID, VENOUS: 0.66 mmol/L (ref 0.5–1.9)

## 2016-04-01 LAB — BRAIN NATRIURETIC PEPTIDE: B NATRIURETIC PEPTIDE 5: 135.2 pg/mL — AB (ref 0.0–100.0)

## 2016-04-01 MED ORDER — MINOXIDIL 2.5 MG PO TABS
2.5000 mg | ORAL_TABLET | Freq: Every day | ORAL | Status: DC
  Administered 2016-04-02 – 2016-04-05 (×4): 2.5 mg via ORAL
  Filled 2016-04-01 (×4): qty 1

## 2016-04-01 MED ORDER — PIPERACILLIN-TAZOBACTAM 3.375 G IVPB
3.3750 g | Freq: Once | INTRAVENOUS | Status: AC
  Administered 2016-04-01: 3.375 g via INTRAVENOUS
  Filled 2016-04-01: qty 50

## 2016-04-01 MED ORDER — VITAMIN C 500 MG PO TABS
1000.0000 mg | ORAL_TABLET | Freq: Every day | ORAL | Status: DC
Start: 2016-04-02 — End: 2016-04-05
  Administered 2016-04-02 – 2016-04-05 (×4): 1000 mg via ORAL
  Filled 2016-04-01 (×4): qty 2

## 2016-04-01 MED ORDER — TAMSULOSIN HCL 0.4 MG PO CAPS
0.4000 mg | ORAL_CAPSULE | Freq: Every day | ORAL | Status: DC
  Administered 2016-04-02 – 2016-04-04 (×3): 0.4 mg via ORAL
  Filled 2016-04-01 (×3): qty 1

## 2016-04-01 MED ORDER — CARVEDILOL 25 MG PO TABS
25.0000 mg | ORAL_TABLET | Freq: Two times a day (BID) | ORAL | Status: DC
  Administered 2016-04-02 – 2016-04-05 (×7): 25 mg via ORAL
  Filled 2016-04-01 (×7): qty 1

## 2016-04-01 MED ORDER — ASPIRIN EC 81 MG PO TBEC
81.0000 mg | DELAYED_RELEASE_TABLET | Freq: Every day | ORAL | Status: DC
  Administered 2016-04-01 – 2016-04-05 (×5): 81 mg via ORAL
  Filled 2016-04-01 (×5): qty 1

## 2016-04-01 MED ORDER — SODIUM CHLORIDE 0.9% FLUSH
3.0000 mL | Freq: Two times a day (BID) | INTRAVENOUS | Status: DC
  Administered 2016-04-01 – 2016-04-04 (×3): 3 mL via INTRAVENOUS

## 2016-04-01 MED ORDER — LATANOPROST 0.005 % OP SOLN
1.0000 [drp] | Freq: Every day | OPHTHALMIC | Status: DC
  Administered 2016-04-01 – 2016-04-04 (×4): 1 [drp] via OPHTHALMIC
  Filled 2016-04-01: qty 2.5

## 2016-04-01 MED ORDER — ISOSORBIDE MONONITRATE ER 60 MG PO TB24
120.0000 mg | ORAL_TABLET | Freq: Every day | ORAL | Status: DC
Start: 2016-04-02 — End: 2016-04-05
  Administered 2016-04-02 – 2016-04-05 (×4): 120 mg via ORAL
  Filled 2016-04-01 (×4): qty 2

## 2016-04-01 MED ORDER — FERROUS SULFATE 325 (65 FE) MG PO TABS
325.0000 mg | ORAL_TABLET | Freq: Every day | ORAL | Status: DC
  Administered 2016-04-02 – 2016-04-05 (×4): 325 mg via ORAL
  Filled 2016-04-01 (×4): qty 1

## 2016-04-01 MED ORDER — INSULIN DETEMIR 100 UNIT/ML ~~LOC~~ SOLN
5.0000 [IU] | Freq: Every day | SUBCUTANEOUS | Status: DC
  Administered 2016-04-01 – 2016-04-04 (×4): 5 [IU] via SUBCUTANEOUS
  Filled 2016-04-01 (×5): qty 0.05

## 2016-04-01 MED ORDER — PIPERACILLIN-TAZOBACTAM 3.375 G IVPB
3.3750 g | Freq: Three times a day (TID) | INTRAVENOUS | Status: DC
  Administered 2016-04-02 – 2016-04-05 (×10): 3.375 g via INTRAVENOUS
  Filled 2016-04-01 (×10): qty 50

## 2016-04-01 MED ORDER — HEPARIN SODIUM (PORCINE) 5000 UNIT/ML IJ SOLN
5000.0000 [IU] | Freq: Three times a day (TID) | INTRAMUSCULAR | Status: DC
  Administered 2016-04-01 – 2016-04-05 (×11): 5000 [IU] via SUBCUTANEOUS
  Filled 2016-04-01 (×9): qty 1

## 2016-04-01 MED ORDER — ACETAMINOPHEN 325 MG PO TABS
650.0000 mg | ORAL_TABLET | Freq: Once | ORAL | Status: AC | PRN
  Administered 2016-04-01: 650 mg via ORAL
  Filled 2016-04-01: qty 2

## 2016-04-01 MED ORDER — IRON 325 (65 FE) MG PO TABS
1.0000 | ORAL_TABLET | Freq: Every day | ORAL | Status: DC
Start: 2016-04-01 — End: 2016-04-01

## 2016-04-01 MED ORDER — ADULT MULTIVITAMIN W/MINERALS CH
1.0000 | ORAL_TABLET | Freq: Every day | ORAL | Status: DC
  Administered 2016-04-02 – 2016-04-05 (×4): 1 via ORAL
  Filled 2016-04-01 (×4): qty 1

## 2016-04-01 MED ORDER — VITAMIN D 1000 UNITS PO TABS
2000.0000 [IU] | ORAL_TABLET | Freq: Every day | ORAL | Status: DC
Start: 2016-04-02 — End: 2016-04-05
  Administered 2016-04-02 – 2016-04-05 (×4): 2000 [IU] via ORAL
  Filled 2016-04-01 (×4): qty 2

## 2016-04-01 MED ORDER — INSULIN ASPART 100 UNIT/ML ~~LOC~~ SOLN
0.0000 [IU] | Freq: Three times a day (TID) | SUBCUTANEOUS | Status: DC
  Administered 2016-04-02: 5 [IU] via SUBCUTANEOUS
  Administered 2016-04-02 (×2): 2 [IU] via SUBCUTANEOUS
  Administered 2016-04-03 (×2): 3 [IU] via SUBCUTANEOUS
  Administered 2016-04-03: 2 [IU] via SUBCUTANEOUS
  Administered 2016-04-04: 3 [IU] via SUBCUTANEOUS
  Administered 2016-04-04: 5 [IU] via SUBCUTANEOUS
  Administered 2016-04-04: 3 [IU] via SUBCUTANEOUS
  Administered 2016-04-05: 2 [IU] via SUBCUTANEOUS
  Administered 2016-04-05: 3 [IU] via SUBCUTANEOUS

## 2016-04-01 MED ORDER — INFLUENZA VAC SPLIT QUAD 0.5 ML IM SUSY
0.5000 mL | PREFILLED_SYRINGE | INTRAMUSCULAR | Status: AC
  Administered 2016-04-02: 0.5 mL via INTRAMUSCULAR
  Filled 2016-04-01 (×2): qty 0.5

## 2016-04-01 MED ORDER — HYDRALAZINE HCL 50 MG PO TABS
50.0000 mg | ORAL_TABLET | Freq: Three times a day (TID) | ORAL | Status: DC
  Administered 2016-04-01 – 2016-04-05 (×11): 50 mg via ORAL
  Filled 2016-04-01 (×10): qty 1

## 2016-04-01 MED ORDER — ONDANSETRON HCL 4 MG/2ML IJ SOLN: 4.0000 mg | Freq: Four times a day (QID) | INTRAMUSCULAR | Status: DC | PRN

## 2016-04-01 MED ORDER — BRIMONIDINE TARTRATE 0.15 % OP SOLN
1.0000 [drp] | Freq: Two times a day (BID) | OPHTHALMIC | Status: DC
  Administered 2016-04-01 – 2016-04-05 (×8): 1 [drp] via OPHTHALMIC
  Filled 2016-04-01: qty 5

## 2016-04-01 MED ORDER — PRASUGREL HCL 10 MG PO TABS
10.0000 mg | ORAL_TABLET | Freq: Every day | ORAL | Status: DC
  Administered 2016-04-01 – 2016-04-04 (×4): 10 mg via ORAL
  Filled 2016-04-01 (×4): qty 1

## 2016-04-01 MED ORDER — VANCOMYCIN HCL 10 G IV SOLR
2000.0000 mg | INTRAVENOUS | Status: AC
  Administered 2016-04-01: 2000 mg via INTRAVENOUS
  Filled 2016-04-01: qty 2000

## 2016-04-01 MED ORDER — SODIUM CHLORIDE 0.9 % IV BOLUS (SEPSIS)
1000.0000 mL | Freq: Once | INTRAVENOUS | Status: AC
  Administered 2016-04-01: 1000 mL via INTRAVENOUS

## 2016-04-01 MED ORDER — FINASTERIDE 5 MG PO TABS
5.0000 mg | ORAL_TABLET | Freq: Every day | ORAL | Status: DC
  Administered 2016-04-01 – 2016-04-05 (×5): 5 mg via ORAL
  Filled 2016-04-01 (×5): qty 1

## 2016-04-01 MED ORDER — PNEUMOCOCCAL VAC POLYVALENT 25 MCG/0.5ML IJ INJ
0.5000 mL | INJECTION | INTRAMUSCULAR | Status: AC
  Administered 2016-04-02: 0.5 mL via INTRAMUSCULAR
  Filled 2016-04-01 (×2): qty 0.5

## 2016-04-01 MED ORDER — CLONIDINE HCL 0.1 MG PO TABS
0.1000 mg | ORAL_TABLET | Freq: Two times a day (BID) | ORAL | Status: DC
  Administered 2016-04-01 – 2016-04-05 (×8): 0.1 mg via ORAL
  Filled 2016-04-01 (×8): qty 1

## 2016-04-01 MED ORDER — VANCOMYCIN HCL IN DEXTROSE 1-5 GM/200ML-% IV SOLN: 1000.0000 mg | Freq: Once | INTRAVENOUS | Status: DC

## 2016-04-01 MED ORDER — VANCOMYCIN HCL 10 G IV SOLR: 1500.0000 mg | INTRAVENOUS | Status: DC

## 2016-04-01 NOTE — Progress Notes (Signed)
CRITICAL VALUE ALERT  Critical value received:  Troponin 0.03  Date of notification:  04/01/16   Time of notification:  2330  Critical value read back:Yes.    Nurse who received alert:  Mena Pauls   MD notified (1st page):  Schorr   Time of first page:  2356  MD notified (2nd page):  Time of second page:  Responding MD:   Time MD responded:

## 2016-04-01 NOTE — ED Provider Notes (Signed)
[facility] DEPT Provider Note   CSN: [id number] Arrival date & time: 04/01/16  1752     History   Chief Complaint Chief Complaint  Patient presents with  . Shortness of Breath    one hour hx of Shortness of breath    HPI Ray Merritt is a 80 y.o. male.  Patient complains of fever chills weakness. Patient had sepsis 3 months ago was admitted to the hospital   The history is provided by the patient. No language interpreter was used.  Fever   This is a new problem. The current episode started 6 to 12 hours ago. The problem occurs constantly. The problem has not changed since onset.His temperature was unmeasured prior to arrival. Pertinent negatives include no chest pain, no diarrhea, no congestion, no headaches and no cough. He has tried nothing for the symptoms. The treatment provided no relief.    Past Medical History:  Diagnosis Date  . Anemia   . Arthritis   . Cancer ([place])   . Chronic kidney disease   . Coronary artery disease   . Diabetes mellitus    insulin dependent   . Enlarged prostate   . Hypertension     Patient Active Problem List   Diagnosis Date Noted  . Type 2 diabetes, HbA1c goal < 7% (HCC)   . Fever 12/10/2015  . HLD (hyperlipidemia) 12/10/2015  . BPH (benign prostatic hyperplasia) 12/10/2015  . FUO (fever of unknown origin) 12/10/2015  . CAD (coronary artery disease), native coronary artery 05/21/2015  . Coronary artery disease involving coronary bypass graft 04/30/2015  . Angina pectoris associated with type 2 diabetes mellitus ([place]) 04/29/2015  . Type II diabetes mellitus, uncontrolled ([place]) 01/25/2015  . Chronic kidney disease (CKD) 01/25/2015  . Hypertension, essential, benign 01/25/2015  . Hyperlipidemia 01/25/2015  . CLL (chronic lymphocytic leukemia) ([place]) 03/02/2014  . Leukocytosis 01/27/2014    Past Surgical History:  Procedure Laterality Date  . CARDIAC CATHETERIZATION N/A 05/01/2015   Procedure: Left Heart Cath and Coronary  Angiography;  Surgeon: Adrian Prows, MD;  Location: [facility];  Service: Cardiovascular;  Laterality: N/A;  . CARDIAC CATHETERIZATION  05/01/2015   Procedure: Intravascular Pressure Wire/FFR Study;  Surgeon: Adrian Prows, MD;  Location: [facility];  Service: Cardiovascular;;  . CARDIAC CATHETERIZATION N/A 05/22/2015   Procedure: Left Heart Cath and Coronary Angiography;  Surgeon: Adrian Prows, MD;  Location: [facility];  Service: Cardiovascular;  Laterality: N/A;  . CARDIAC CATHETERIZATION N/A 05/22/2015   Procedure: Coronary Stent Intervention;  Surgeon: Adrian Prows, MD;  Location: [facility];  Service: Cardiovascular;  Laterality: N/A;  . SHOULDER SURGERY         Home Medications    Prior to Admission medications   Medication Sig Start Date End Date Taking? Authorizing Provider  ALPHAGAN P 0.1 % SOLN Place 1 drop into both eyes 2 (two) times daily. 10/17/15  Yes Historical Provider, MD  Ascorbic Acid (VITAMIN C) 1000 MG tablet Take 1,000 mg by mouth daily.   Yes Historical Provider, MD  aspirin 81 MG tablet Take 81 mg by mouth daily.   Yes Historical Provider, MD  atorvastatin (LIPITOR) 10 MG tablet Take 20 mg by mouth daily.    Yes Historical Provider, MD  carvedilol (COREG) 25 MG tablet Take 25 mg by mouth 2 (two) times daily with a meal.  03/30/16  Yes Historical Provider, MD  Cholecalciferol (VITAMIN D) 2000 units tablet Take 2,000 Units by mouth daily.  Yes Historical Provider, MD  cloNIDine (CATAPRES) 0.1 MG tablet Take 0.1 mg by mouth 2 (two) times daily. 02/27/16  Yes Historical Provider, MD  diphenhydramine-acetaminophen (TYLENOL PM) 25-500 MG TABS tablet Take 2 tablets by mouth at bedtime.   Yes Historical Provider, MD  Ferrous Sulfate (IRON) 325 (65 Fe) MG TABS Take 1 tablet by mouth daily.   Yes Historical Provider, MD  finasteride (PROSCAR) 5 MG tablet Take 5 mg by mouth daily.   Yes Historical Provider, MD  furosemide (LASIX) 40 MG tablet Take 40 mg by  mouth daily.   Yes Historical Provider, MD  hydrALAZINE (APRESOLINE) 50 MG tablet Take 50 mg by mouth 3 (three) times daily. 04/25/15  Yes Historical Provider, MD  insulin NPH Human (HUMULIN N,NOVOLIN N) 100 UNIT/ML injection Inject 0.15 mLs (15 Units total) into the skin 2 (two) times daily before a meal. 12/13/15  Yes Belkys A Regalado, MD  isosorbide mononitrate (IMDUR) 120 MG 24 hr tablet Take 120 mg by mouth daily.   Yes Historical Provider, MD  minoxidil (LONITEN) 2.5 MG tablet Take 2.5 mg by mouth daily.   Yes Historical Provider, MD  Multiple Vitamin (MULTIVITAMIN WITH MINERALS) TABS tablet Take 1 tablet by mouth daily.   Yes Historical Provider, MD  NOVOLIN R 100 UNIT/ML injection Inject 0.04 mLs (4 Units total) into the skin 2 (two) times daily before a meal. Patient taking differently: Inject 6-7 Units into the skin 2 (two) times daily before a meal. Take 7 units in the am and Take 6 units at bedtime 12/13/15  Yes Belkys A Regalado, MD  Omega-3 Fatty Acids (FISH OIL) 1200 MG CAPS Take 1 capsule by mouth daily.   Yes Historical Provider, MD  prasugrel (EFFIENT) 10 MG TABS tablet Take 1 tablet (10 mg total) by mouth daily. Patient taking differently: Take 10 mg by mouth at bedtime.  05/24/15  Yes Neldon Labella, NP  tamsulosin (FLOMAX) 0.4 MG CAPS capsule Take 0.4 mg by mouth daily after supper.   Yes Historical Provider, MD  Travoprost, BAK Free, (TRAVATAN) 0.004 % SOLN ophthalmic solution Place 1 drop into both eyes at bedtime.   Yes Historical Provider, MD  triamcinolone cream (KENALOG) 0.1 % Apply 1 application topically daily.  10/12/14  Yes Historical Provider, MD  glucose blood (BAYER CONTOUR NEXT TEST) test strip Use as instructed to check blood sugar 2 times per day dx code E11.65 Patient taking differently: 1 each by Other route See admin instructions. Check blood sugar 3 times daily 01/26/15   Elayne Snare, MD    Family History Family History  Problem Relation Age of Onset  .  Hypertension Mother   . Heart disease Sister   . Stroke Sister   . Diabetes Brother     Social History Social History  Substance Use Topics  . Smoking status: Former Research scientist (life sciences)  . Smokeless tobacco: Never Used     Comment: quit smoking in early 70's  . Alcohol use No     Allergies   Review of patient's allergies indicates no known allergies.   Review of Systems Review of Systems  Constitutional: Positive for fever. Negative for appetite change and fatigue.  HENT: Negative for congestion, ear discharge and sinus pressure.   Eyes: Negative for discharge.  Respiratory: Negative for cough.   Cardiovascular: Negative for chest pain.  Gastrointestinal: Negative for abdominal pain and diarrhea.  Genitourinary: Negative for frequency and hematuria.  Musculoskeletal: Negative for back pain.  Skin: Negative for rash.  Neurological:  Negative for seizures and headaches.  Psychiatric/Behavioral: Negative for hallucinations.     Physical Exam Updated Vital Signs BP (!) 155/52 (BP Location: Left Arm)   Pulse 73   Temp 99.9 F (37.7 C) (Oral)   Resp 18   Ht 6' 3.5" (1.918 m)   Wt 248 lb (112.5 kg)   SpO2 99%   BMI 30.59 kg/m   Physical Exam  Constitutional: He is oriented to person, place, and time. He appears well-developed.  HENT:  Head: Normocephalic.  Eyes: Conjunctivae and EOM are normal. No scleral icterus.  Neck: Neck supple. No thyromegaly present.  Cardiovascular: Normal rate and regular rhythm.  Exam reveals no gallop and no friction rub.   No murmur heard. Pulmonary/Chest: No stridor. He has no wheezes. He has no rales. He exhibits no tenderness.  Abdominal: He exhibits no distension. There is no tenderness. There is no rebound.  Musculoskeletal: Normal range of motion. He exhibits no edema.  Lymphadenopathy:    He has no cervical adenopathy.  Neurological: He is oriented to person, place, and time. He exhibits normal muscle tone. Coordination normal.  Skin: No  rash noted. No erythema.  Psychiatric: He has a normal mood and affect. His behavior is normal.     ED Treatments / Results  Labs (all labs ordered are listed, but only abnormal results are displayed) Labs Reviewed  COMPREHENSIVE METABOLIC PANEL - Abnormal; Notable for the following:       Result Value   Chloride 112 (*)    CO2 20 (*)    BUN 81 (*)    Creatinine, Ser 3.23 (*)    GFR calc non Af Amer 17 (*)    GFR calc Af Amer 20 (*)    All other components within normal limits  CBC WITH DIFFERENTIAL/PLATELET - Abnormal; Notable for the following:    WBC 19.5 (*)    RBC 3.63 (*)    Hemoglobin 10.7 (*)    HCT 31.8 (*)    Neutro Abs 10.9 (*)    Lymphs Abs 7.2 (*)    Monocytes Absolute 1.2 (*)    All other components within normal limits  URINALYSIS, ROUTINE W REFLEX MICROSCOPIC (NOT AT [facility]) - Abnormal; Notable for the following:    Protein, ur 30 (*)    All other components within normal limits  I-STAT CHEM 8, ED - Abnormal; Notable for the following:    BUN 71 (*)    Creatinine, Ser 3.50 (*)    Hemoglobin 10.9 (*)    HCT 32.0 (*)    All other components within normal limits  CULTURE, BLOOD (ROUTINE X 2)  CULTURE, BLOOD (ROUTINE X 2)  URINE CULTURE  URINE MICROSCOPIC-ADD ON  I-STAT CG4 LACTIC ACID, ED  I-STAT CG4 LACTIC ACID, ED    EKG  EKG Interpretation  Date/Time:  Tuesday April 01 2016 18:03:31 EDT Ventricular Rate:  78 PR Interval:    QRS Duration: 90 QT Interval:  349 QTC Calculation: 398 R Axis:   57 Text Interpretation:  Sinus rhythm Atrial premature complex Borderline repolarization abnormality Confirmed by Itzel Lowrimore  MD, Preet Perrier [id number]) on 04/01/2016 8:24:09 PM       Radiology Dg Chest 2 View  Result Date: 04/01/2016 CLINICAL DATA:  80 year old male complains of shortness of breath, cough and emesis EXAM: CHEST  2 VIEW COMPARISON:  12/10/2015, 03/17/2000 FINDINGS: AP and lateral views of the chest. No acute consolidation or pleural effusion  identified. Borderline enlargement of the heart size. Suggestion of mild  central vascular congestion. No overt edema. Atherosclerosis of the aortic arch. No pneumothorax. IMPRESSION: 1. No acute pulmonary infiltrate or focal consolidation identified 2. Borderline enlargement of heart size with suggestion of mild central vascular congestion. Electronically Signed   By: Donavan Foil M.D.   On: 04/01/2016 19:22    Procedures Procedures (including critical care time)  Medications Ordered in ED Medications  acetaminophen (TYLENOL) tablet 650 mg (650 mg Oral Given 04/01/16 1834)  sodium chloride 0.9 % bolus 1,000 mL (0 mLs Intravenous Stopped 04/01/16 1935)  piperacillin-tazobactam (ZOSYN) IVPB 3.375 g (0 g Intravenous Stopped 04/01/16 1926)     Initial Impression / Assessment and Plan / ED Course  I have reviewed the triage vital signs and the nursing notes.  Pertinent labs & imaging results that were available during my care of the patient were reviewed by me and considered in my medical decision making (see chart for details).  Clinical Course    Patient with fevers and chills along with elevated white blood count. Urine unremarkable chest x-ray unremarkable cultures have been done. Patient will be admitted to obs and empirically treated with antibiotics  Final Clinical Impressions(s) / ED Diagnoses   Final diagnoses:  Fever chills    New Prescriptions New Prescriptions   No medications on file     Milton Ferguson, MD 04/01/16 2101

## 2016-04-01 NOTE — ED Triage Notes (Signed)
Pt stated that he was at home and started feeling short of breath, Pt stated that it got worse when he was on his way to the hospital. Wife at bedside

## 2016-04-01 NOTE — ED Notes (Signed)
Hospitalist at bedside to evaluate pt.

## 2016-04-01 NOTE — Progress Notes (Signed)
Pharmacy Antibiotic Note  Ray Merritt is a 80 y.o. male admitted on 04/01/2016 with sepsis.  Pharmacy has been consulted for Vancomycin & Zosyn dosing.  Plan:  Vancomycin 2gm IV x 1 followed by 1500mg  IV q48h  Vancomycin Trough Level = 15-20 mcg/ml  Zosyn 3.375gm IV q8h (each dose infused over 4 hrs)  F/U cultures/sensitivities  Follow renal function  Height: 6' 3.5" (191.8 cm) Weight: 248 lb 10.9 oz (112.8 kg) IBW/kg (Calculated) : 85.65  Temp (24hrs), Avg:100.2 F (37.9 C), Min:99.3 F (37.4 C), Max:101 F (38.3 C)   Recent Labs Lab 04/01/16 1827 04/01/16 1840 04/01/16 1841  WBC 19.5*  --   --   CREATININE 3.23* 3.50*  --   LATICACIDVEN  --   --  1.46    Estimated Creatinine Clearance: 23.7 mL/min (by C-G formula based on SCr of 3.5 mg/dL).   9/12: CrCl (n) = 17 ml/min  No Known Allergies  Antimicrobials this admission: 9/12 vanc >>   9/12 zosyn >>    Dose adjustments this admission:    Microbiology results: 9/12 BCx: sent 9/12 UCx: sent   Thank you for allowing pharmacy to be a part of this patient's care.  Everette Rank, PharmD 04/01/2016 10:33 PM

## 2016-04-01 NOTE — ED Notes (Signed)
Bed: SL37 Expected date:  Expected time:  Means of arrival:  Comments: Hym[place], [place]

## 2016-04-01 NOTE — H&P (Addendum)
History and Physical    Ray Merritt:[id number] DOB: 1935/11/20 DOA: 04/01/2016  Referring MD/NP/PA: Dr. Roderic Palau PCP: Antony Blackbird, MD  Patient coming from: Home  Chief Complaint: Chills  HPI: Ray Merritt is a 80 y.o. male with medical history significant of CLL(previously noted to be in remission), HTN, HLD, CAD s/p stents, CKD, BPH, and uncontrolled DM type 2; who presents with complaints of chills. Symptoms started sometime around 4:30 PM this afternoon. Family is present at bedside and helps provide additional history. They note that he vomited once or twice, complained of shortness of breath, was mildly confused, and unsteady on his feet. He had otherwise been in his normal state of health prior. Patient had similar symptoms back in 11/2015 for which she was admitted with sepsis to be secondary to a UTI possibly. He is followed by Dr. Earlie Server of oncology last saw him on 8/22 and at that time lab work was done. It appears that his creatinine had jumped from 2.68 up to 3.2 at that time. Patient notes that he's followed by Dr. Florene Glen of nephrology but does not have a follow-up appointment set up at this time. He has not notified him of the decrease in his kidney function. Patient notes that he's been having shortness of breath that was worse while at home, but is now back to baseline. He notes that he's had worsening leg swelling, but believes that his weight has been relatively stable. Over this past weekend they had traveled in a car to [place] to go to a funeral. Family notes multiple stops for the patient to use the restroom along the way. PDenies having any chest pain, dysuria, focal weakness, loss of consciousness,  blood in stool/urine, or recent changes in medications.  ED Course: Upon admission to the emergency department patient was evaluated and seen to be febrile up to 101F, respirations seen up to 30, blood pressure as high as 165/63, and O2 saturations maintain. Laboratory  revealed WBC 19.5 (10.8 on 8/22),  Hemoglobin 10.7, creatinine 3.23, BUN 81, and lactic acid 1.46. Chest x-ray showed borderline cardiac enlargement and vascular congestion. Urinalysis was negative for any signs of infection.   Review of Systems: As per HPI otherwise 10 point review of systems negative.   Past Medical History:  Diagnosis Date  . Anemia   . Arthritis   . Cancer ([place])   . Chronic kidney disease   . Coronary artery disease   . Diabetes mellitus    insulin dependent   . Enlarged prostate   . Hypertension     Past Surgical History:  Procedure Laterality Date  . CARDIAC CATHETERIZATION N/A 05/01/2015   Procedure: Left Heart Cath and Coronary Angiography;  Surgeon: Adrian Prows, MD;  Location: [place] [facility];  Service: Cardiovascular;  Laterality: N/A;  . CARDIAC CATHETERIZATION  05/01/2015   Procedure: Intravascular Pressure Wire/FFR Study;  Surgeon: Adrian Prows, MD;  Location: [place] [facility];  Service: Cardiovascular;;  . CARDIAC CATHETERIZATION N/A 05/22/2015   Procedure: Left Heart Cath and Coronary Angiography;  Surgeon: Adrian Prows, MD;  Location: [place] [facility];  Service: Cardiovascular;  Laterality: N/A;  . CARDIAC CATHETERIZATION N/A 05/22/2015   Procedure: Coronary Stent Intervention;  Surgeon: Adrian Prows, MD;  Location: [place] [facility];  Service: Cardiovascular;  Laterality: N/A;  . SHOULDER SURGERY       reports that he has quit smoking. He has never used smokeless tobacco. He reports that he does not drink alcohol  or use drugs.  No Known Allergies  Family History  Problem Relation Age of Onset  . Hypertension Mother   . Heart disease Sister   . Stroke Sister   . Diabetes Brother     Prior to Admission medications   Medication Sig Start Date End Date Taking? Authorizing Provider  ALPHAGAN P 0.1 % SOLN Place 1 drop into both eyes 2 (two) times daily. 10/17/15  Yes Historical Provider, MD  Ascorbic Acid (VITAMIN C) 1000 MG tablet Take 1,000 mg by  mouth daily.   Yes Historical Provider, MD  aspirin 81 MG tablet Take 81 mg by mouth daily.   Yes Historical Provider, MD  atorvastatin (LIPITOR) 10 MG tablet Take 20 mg by mouth daily.    Yes Historical Provider, MD  carvedilol (COREG) 25 MG tablet Take 25 mg by mouth 2 (two) times daily with a meal.  03/30/16  Yes Historical Provider, MD  Cholecalciferol (VITAMIN D) 2000 units tablet Take 2,000 Units by mouth daily.   Yes Historical Provider, MD  cloNIDine (CATAPRES) 0.1 MG tablet Take 0.1 mg by mouth 2 (two) times daily. 02/27/16  Yes Historical Provider, MD  diphenhydramine-acetaminophen (TYLENOL PM) 25-500 MG TABS tablet Take 2 tablets by mouth at bedtime.   Yes Historical Provider, MD  Ferrous Sulfate (IRON) 325 (65 Fe) MG TABS Take 1 tablet by mouth daily.   Yes Historical Provider, MD  finasteride (PROSCAR) 5 MG tablet Take 5 mg by mouth daily.   Yes Historical Provider, MD  furosemide (LASIX) 40 MG tablet Take 40 mg by mouth daily.   Yes Historical Provider, MD  hydrALAZINE (APRESOLINE) 50 MG tablet Take 50 mg by mouth 3 (three) times daily. 04/25/15  Yes Historical Provider, MD  insulin NPH Human (HUMULIN N,NOVOLIN N) 100 UNIT/ML injection Inject 0.15 mLs (15 Units total) into the skin 2 (two) times daily before a meal. 12/13/15  Yes Belkys A Regalado, MD  isosorbide mononitrate (IMDUR) 120 MG 24 hr tablet Take 120 mg by mouth daily.   Yes Historical Provider, MD  minoxidil (LONITEN) 2.5 MG tablet Take 2.5 mg by mouth daily.   Yes Historical Provider, MD  Multiple Vitamin (MULTIVITAMIN WITH MINERALS) TABS tablet Take 1 tablet by mouth daily.   Yes Historical Provider, MD  NOVOLIN R 100 UNIT/ML injection Inject 0.04 mLs (4 Units total) into the skin 2 (two) times daily before a meal. Patient taking differently: Inject 6-7 Units into the skin 2 (two) times daily before a meal. Take 7 units in the am and Take 6 units at bedtime 12/13/15  Yes Belkys A Regalado, MD  Omega-3 Fatty Acids (FISH OIL)  1200 MG CAPS Take 1 capsule by mouth daily.   Yes Historical Provider, MD  prasugrel (EFFIENT) 10 MG TABS tablet Take 1 tablet (10 mg total) by mouth daily. Patient taking differently: Take 10 mg by mouth at bedtime.  05/24/15  Yes Neldon Labella, NP  tamsulosin (FLOMAX) 0.4 MG CAPS capsule Take 0.4 mg by mouth daily after supper.   Yes Historical Provider, MD  Travoprost, BAK Free, (TRAVATAN) 0.004 % SOLN ophthalmic solution Place 1 drop into both eyes at bedtime.   Yes Historical Provider, MD  triamcinolone cream (KENALOG) 0.1 % Apply 1 application topically daily.  10/12/14  Yes Historical Provider, MD  glucose blood (BAYER CONTOUR NEXT TEST) test strip Use as instructed to check blood sugar 2 times per day dx code E11.65 Patient taking differently: 1 each by Other route See admin instructions. Check  blood sugar 3 times daily 01/26/15   Elayne Snare, MD    Physical Exam:    Constitutional: Elderly male who appears in mild distress NAD, calm, comfortable Vitals:   04/01/16 1806 04/01/16 1836 04/01/16 1922 04/01/16 2000  BP:   (!) 162/53 165/63  Pulse: 77  74 74  Resp:   18 17  Temp: 101 F (38.3 C)  100.7 F (38.2 C)   TempSrc: Oral  Oral   SpO2: 100%  99% 95%  Weight:  112.5 kg (248 lb)    Height:  6' 3.5" (1.918 m)     Eyes: PERRL, lids and conjunctivae normal ENMT: Mucous membranes are moist. Posterior pharynx clear of any exudate or lesions.Normal dentition.  Neck: normal, supple, no masses, no thyromegaly Respiratory: clear to auscultation bilaterally, no wheezing, no crackles. Normal respiratory effort. No accessory muscle use.  Cardiovascular: Regular rate and rhythm, no murmurs / rubs / gallops. +2 pitting edema of the lower extremitiy. 2+ pedal pulses. No carotid bruits.  Abdomen: no tenderness, no masses palpated. No hepatosplenomegaly. Bowel sounds positive.  Musculoskeletal: no clubbing / cyanosis. No joint deformity upper and lower extremities. Good ROM, no contractures.  Normal muscle tone.  Skin: The patient is mildly diaphoretic. no rashes, lesions, ulcers. No induration Neurologic: CN 2-12 grossly intact. Sensation intact, DTR normal. Strength 4+/5 in all 4.  Psychiatric: Normal judgment and insight. Alert and oriented x 3. Normal mood.     Labs on Admission: I have personally reviewed following labs and imaging studies  CBC:  Recent Labs Lab 04/01/16 1827 04/01/16 1840  WBC 19.5*  --   NEUTROABS 10.9*  --   HGB 10.7* 10.9*  HCT 31.8* 32.0*  MCV 87.6  --   PLT 211  --    Basic Metabolic Panel:  Recent Labs Lab 04/01/16 1827 04/01/16 1840  NA 141 140  K 4.7 4.7  CL 112* 111  CO2 20*  --   GLUCOSE 74 71  BUN 81* 71*  CREATININE 3.23* 3.50*  CALCIUM 9.6  --    GFR: Estimated Creatinine Clearance: 23.7 mL/min (by C-G formula based on SCr of 3.5 mg/dL). Liver Function Tests:  Recent Labs Lab 04/01/16 1827  AST 18  ALT 24  ALKPHOS 55  BILITOT 0.8  PROT 7.2  ALBUMIN 4.4   No results for input(s): LIPASE, AMYLASE in the last 168 hours. No results for input(s): AMMONIA in the last 168 hours. Coagulation Profile: No results for input(s): INR, PROTIME in the last 168 hours. Cardiac Enzymes: No results for input(s): CKTOTAL, CKMB, CKMBINDEX, TROPONINI in the last 168 hours. BNP (last 3 results) No results for input(s): PROBNP in the last 8760 hours. HbA1C: No results for input(s): HGBA1C in the last 72 hours. CBG: No results for input(s): GLUCAP in the last 168 hours. Lipid Profile: No results for input(s): CHOL, HDL, LDLCALC, TRIG, CHOLHDL, LDLDIRECT in the last 72 hours. Thyroid Function Tests: No results for input(s): TSH, T4TOTAL, FREET4, T3FREE, THYROIDAB in the last 72 hours. Anemia Panel: No results for input(s): VITAMINB12, FOLATE, FERRITIN, TIBC, IRON, RETICCTPCT in the last 72 hours. Urine analysis:    Component Value Date/Time   COLORURINE YELLOW 04/01/2016 1857   APPEARANCEUR CLEAR 04/01/2016 1857   LABSPEC  1.013 04/01/2016 1857   PHURINE 5.0 04/01/2016 1857   GLUCOSEU NEGATIVE 04/01/2016 1857   HGBUR NEGATIVE 04/01/2016 1857   BILIRUBINUR NEGATIVE 04/01/2016 1857   KETONESUR NEGATIVE 04/01/2016 1857   PROTEINUR 30 (A) 04/01/2016 1857  UROBILINOGEN 0.2 10/07/2011 1203   NITRITE NEGATIVE 04/01/2016 [place] 04/01/2016 1857   Sepsis Labs: No results found for this or any previous visit (from the past 240 hour(s)).   Radiological Exams on Admission: Dg Chest 2 View  Result Date: 04/01/2016 CLINICAL DATA:  80 year old male complains of shortness of breath, cough and emesis EXAM: CHEST  2 VIEW COMPARISON:  12/10/2015, 03/17/2000 FINDINGS: AP and lateral views of the chest. No acute consolidation or pleural effusion identified. Borderline enlargement of the heart size. Suggestion of mild central vascular congestion. No overt edema. Atherosclerosis of the aortic arch. No pneumothorax. IMPRESSION: 1. No acute pulmonary infiltrate or focal consolidation identified 2. Borderline enlargement of heart size with suggestion of mild central vascular congestion. Electronically Signed   By: Donavan Foil M.D.   On: 04/01/2016 19:22    EKG: Independently reviewed. Sinus rhythm with atrial premature complex  Assessment/Plan SIRS/Fever of unknown origin : acute. Patient reports chills found to have a fever 101F with elevated WBC of 19.5 on admission. Patient meets SIRS criteria question source at this time as chest x-ray appears negative for infection and urinalysis is clean. - Admit to a telemetry bed - Follow up blood cultures - Continue empiric antibiotics  Acute kidney injury on chronic kidney disease stage IV: Previously baseline creatinines have been in the 2.6 range. However creatinine noted to be as high as 3.50 by i-stat with a BUN of 71. - Check FeUr - Renal ultrasound - Held Lasix and Benadryl for now, reassess in a.m.  - Gentle IV fluids if tolerated  Shortness of breath  Leg swelling: Last echo showing EF of 50-55% with akinesis of the mid-anteroseptal myocardium. - continuous pulse oximetry overnight - Check BNP and troponin - check  vascular duplex Doppler ultrasound of the lower extremities and I am  CLL (chronic lymphocytic leukemia) ([place]): Patient is followed by Dr. Earlie Server of oncology last seen on 8/22. - will need to notify oncology of admission in am  CAD s/p stents - Continue ASA, isosorbide mononitrate and Effient  Type 2 diabetes mellitus, uncontrolled:  last hemoglobin A1c noted to be 7.3 and 01/2016 - Hypoglycemic protocols - SSI with levemir for now  Essential hypertension - Continue clonidine and Coreg  Hyperlipidemia - Continue atorvastatin   Benign prostatic hyperplasia - Continue Flomax and Proscar   DVT prophylaxis: heparin Code Status: Full code  Family Communication:  Discussed plan with patient and family at bedside. Disposition Plan: Home when medically stable Consults called: none Admission status: Observation MedSurg bed  Norval Morton MD [facility] Pager [phone]  If 7PM-7AM, please contact night-coverage [email or web address] Password [facility]  04/01/2016, 8:43 PM

## 2016-04-02 ENCOUNTER — Observation Stay (HOSPITAL_BASED_OUTPATIENT_CLINIC_OR_DEPARTMENT_OTHER): Payer: Medicare Other

## 2016-04-02 ENCOUNTER — Encounter (HOSPITAL_COMMUNITY): Payer: Self-pay | Admitting: Radiology

## 2016-04-02 ENCOUNTER — Observation Stay (HOSPITAL_COMMUNITY): Payer: Medicare Other

## 2016-04-02 DIAGNOSIS — D631 Anemia in chronic kidney disease: Secondary | ICD-10-CM | POA: Diagnosis not present

## 2016-04-02 DIAGNOSIS — Z823 Family history of stroke: Secondary | ICD-10-CM | POA: Diagnosis not present

## 2016-04-02 DIAGNOSIS — I5032 Chronic diastolic (congestive) heart failure: Secondary | ICD-10-CM | POA: Diagnosis not present

## 2016-04-02 DIAGNOSIS — N184 Chronic kidney disease, stage 4 (severe): Secondary | ICD-10-CM | POA: Diagnosis present

## 2016-04-02 DIAGNOSIS — N189 Chronic kidney disease, unspecified: Secondary | ICD-10-CM | POA: Diagnosis not present

## 2016-04-02 DIAGNOSIS — Z6830 Body mass index (BMI) 30.0-30.9, adult: Secondary | ICD-10-CM | POA: Diagnosis not present

## 2016-04-02 DIAGNOSIS — Z23 Encounter for immunization: Secondary | ICD-10-CM | POA: Diagnosis not present

## 2016-04-02 DIAGNOSIS — N281 Cyst of kidney, acquired: Secondary | ICD-10-CM | POA: Diagnosis present

## 2016-04-02 DIAGNOSIS — E669 Obesity, unspecified: Secondary | ICD-10-CM | POA: Diagnosis present

## 2016-04-02 DIAGNOSIS — J40 Bronchitis, not specified as acute or chronic: Secondary | ICD-10-CM | POA: Diagnosis present

## 2016-04-02 DIAGNOSIS — N390 Urinary tract infection, site not specified: Secondary | ICD-10-CM | POA: Diagnosis present

## 2016-04-02 DIAGNOSIS — R2681 Unsteadiness on feet: Secondary | ICD-10-CM | POA: Diagnosis present

## 2016-04-02 DIAGNOSIS — M7989 Other specified soft tissue disorders: Secondary | ICD-10-CM

## 2016-04-02 DIAGNOSIS — Z955 Presence of coronary angioplasty implant and graft: Secondary | ICD-10-CM | POA: Diagnosis not present

## 2016-04-02 DIAGNOSIS — J189 Pneumonia, unspecified organism: Secondary | ICD-10-CM | POA: Diagnosis present

## 2016-04-02 DIAGNOSIS — E785 Hyperlipidemia, unspecified: Secondary | ICD-10-CM | POA: Diagnosis present

## 2016-04-02 DIAGNOSIS — I5033 Acute on chronic diastolic (congestive) heart failure: Secondary | ICD-10-CM | POA: Diagnosis present

## 2016-04-02 DIAGNOSIS — N179 Acute kidney failure, unspecified: Secondary | ICD-10-CM | POA: Diagnosis not present

## 2016-04-02 DIAGNOSIS — C9111 Chronic lymphocytic leukemia of B-cell type in remission: Secondary | ICD-10-CM | POA: Diagnosis present

## 2016-04-02 DIAGNOSIS — A419 Sepsis, unspecified organism: Secondary | ICD-10-CM | POA: Diagnosis not present

## 2016-04-02 DIAGNOSIS — I130 Hypertensive heart and chronic kidney disease with heart failure and stage 1 through stage 4 chronic kidney disease, or unspecified chronic kidney disease: Secondary | ICD-10-CM | POA: Diagnosis present

## 2016-04-02 DIAGNOSIS — I2510 Atherosclerotic heart disease of native coronary artery without angina pectoris: Secondary | ICD-10-CM | POA: Diagnosis present

## 2016-04-02 DIAGNOSIS — E1129 Type 2 diabetes mellitus with other diabetic kidney complication: Secondary | ICD-10-CM | POA: Diagnosis not present

## 2016-04-02 DIAGNOSIS — C9110 Chronic lymphocytic leukemia of B-cell type not having achieved remission: Secondary | ICD-10-CM | POA: Diagnosis not present

## 2016-04-02 DIAGNOSIS — D509 Iron deficiency anemia, unspecified: Secondary | ICD-10-CM | POA: Diagnosis present

## 2016-04-02 DIAGNOSIS — E1122 Type 2 diabetes mellitus with diabetic chronic kidney disease: Secondary | ICD-10-CM | POA: Diagnosis present

## 2016-04-02 DIAGNOSIS — R0600 Dyspnea, unspecified: Secondary | ICD-10-CM | POA: Diagnosis not present

## 2016-04-02 DIAGNOSIS — E8770 Fluid overload, unspecified: Secondary | ICD-10-CM | POA: Diagnosis not present

## 2016-04-02 DIAGNOSIS — R509 Fever, unspecified: Secondary | ICD-10-CM | POA: Diagnosis not present

## 2016-04-02 DIAGNOSIS — Z87891 Personal history of nicotine dependence: Secondary | ICD-10-CM | POA: Diagnosis not present

## 2016-04-02 DIAGNOSIS — N400 Enlarged prostate without lower urinary tract symptoms: Secondary | ICD-10-CM | POA: Diagnosis present

## 2016-04-02 DIAGNOSIS — J439 Emphysema, unspecified: Secondary | ICD-10-CM | POA: Diagnosis not present

## 2016-04-02 DIAGNOSIS — E872 Acidosis: Secondary | ICD-10-CM | POA: Diagnosis present

## 2016-04-02 DIAGNOSIS — E1165 Type 2 diabetes mellitus with hyperglycemia: Secondary | ICD-10-CM | POA: Diagnosis present

## 2016-04-02 LAB — COMPREHENSIVE METABOLIC PANEL
ALBUMIN: 3.5 g/dL (ref 3.5–5.0)
ALK PHOS: 40 U/L (ref 38–126)
ALT: 21 U/L (ref 17–63)
AST: 15 U/L (ref 15–41)
Anion gap: 9 (ref 5–15)
BUN: 78 mg/dL — ABNORMAL HIGH (ref 6–20)
CALCIUM: 8.6 mg/dL — AB (ref 8.9–10.3)
CHLORIDE: 110 mmol/L (ref 101–111)
CO2: 18 mmol/L — AB (ref 22–32)
CREATININE: 3.22 mg/dL — AB (ref 0.61–1.24)
GFR calc Af Amer: 20 mL/min — ABNORMAL LOW (ref >60)
GFR calc non Af Amer: 17 mL/min — ABNORMAL LOW (ref >60)
GLUCOSE: 185 mg/dL — AB (ref 65–99)
Potassium: 4.6 mmol/L (ref 3.5–5.1)
SODIUM: 137 mmol/L (ref 135–145)
Total Bilirubin: 0.4 mg/dL (ref 0.3–1.2)
Total Protein: 5.9 g/dL — ABNORMAL LOW (ref 6.5–8.1)

## 2016-04-02 LAB — GLUCOSE, CAPILLARY
GLUCOSE-CAPILLARY: 190 mg/dL — AB (ref 65–99)
GLUCOSE-CAPILLARY: 162 mg/dL — AB (ref 65–99)
GLUCOSE-CAPILLARY: 256 mg/dL — AB (ref 65–99)
GLUCOSE-CAPILLARY: 234 mg/dL — AB (ref 65–99)

## 2016-04-02 LAB — CBC
HCT: 27.9 % — ABNORMAL LOW (ref 39.0–52.0)
HEMOGLOBIN: 9.3 g/dL — AB (ref 13.0–17.0)
MCH: 29.7 pg (ref 26.0–34.0)
MCHC: 33.3 g/dL (ref 30.0–36.0)
MCV: 89.1 fL (ref 78.0–100.0)
PLATELETS: 195 10*3/uL (ref 150–400)
RBC: 3.13 MIL/uL — AB (ref 4.22–5.81)
RDW: 14.2 % (ref 11.5–15.5)
WBC: 21.3 10*3/uL — AB (ref 4.0–10.5)

## 2016-04-02 LAB — CREATININE, URINE, RANDOM: CREATININE, URINE: 87.2 mg/dL

## 2016-04-02 LAB — PROCALCITONIN: PROCALCITONIN: 6.08 ng/mL

## 2016-04-02 MED ORDER — SODIUM CHLORIDE 0.9 % IV SOLN
INTRAVENOUS | Status: DC
  Administered 2016-04-02: 04:00:00 via INTRAVENOUS

## 2016-04-02 NOTE — Care Management Obs Status (Signed)
MEDICARE OBSERVATION STATUS NOTIFICATION   Patient Details  Name: Ray Merritt MRN: [id number] Date of Birth: [birth year]   Medicare Observation Status Notification Given:  Yes    MahabirJuliann Pulse, RN 04/02/2016, 12:51 PM

## 2016-04-02 NOTE — Progress Notes (Signed)
*  PRELIMINARY RESULTS* Vascular Ultrasound Lower extremity venous duplex has been completed.  Preliminary findings: No evidence of DVT or baker's cyst.   Landry Mellow, RDMS, RVT  04/02/2016, 9:31 AM

## 2016-04-02 NOTE — Care Management Note (Signed)
Case Management Note  Patient Details  Name: Ray Merritt MRN: [id number] Date of Birth: [birth year]  Subjective/Objective: 80 y/o m admitted s/SIRS. From home.                   Action/Plan:d/c plan home.   Expected Discharge Date:                  Expected Discharge Plan:  Home/Self Care  In-House Referral:     Discharge planning Services  CM Consult  Post Acute Care Choice:    Choice offered to:     DME Arranged:    DME Agency:     HH Arranged:    HH Agency:     Status of Service:  In process, will continue to follow  If discussed at Long Length of Stay Meetings, dates discussed:    Additional Comments:  Dessa Phi, RN 04/02/2016, 12:51 PM

## 2016-04-02 NOTE — Progress Notes (Addendum)
Patient ID: Ray Merritt, male   DOB: 15-Sep-1935, 80 y.o.   MRN: [id number]    PROGRESS NOTE    Ray Merritt  ION:[id number] DOB: 10/08/1935 DOA: 04/01/2016  PCP: Antony Blackbird, MD   Brief Narrative:  80 y.o. male with medical history significant of CLL (previously noted to be in remission), HTN, HLD, CAD s/p stents, CKD, BPH, and uncontrolled DM type 2; who presented with main concern of 1-2 days duration of poor oral intake, nausea and few episodes of non bloody vomiting. This was associated with subjective fevers, chills, confusion, unsteady gait. Pt reported similar event in the past in May 2017 when he was admitted for sepsis due to UTI. Pt explained that his dyspnea has been present for longer time and was associated with LE swelling, no specific weight change but he has noted he was unable to lay down flat.   Assessment & Plan:   Principal Problem:   Sepsis of unknown etiology - please note that pt met sepsis criteria on admission with with T 101, WBC 19K, procalcitonin > 6, RR up to 30 - source is not clear - blood cultures pending at this time - UA looks clear and CXR with no evidence of an infectious etiology - I am however concerned with possible PNA, given pt's cough and fevers, dyspnea, will therefore obtain CT chest for clearer evaluation  - continue zosyn day #2 until source determined  - suspect that vanc can be stopped especially given underlying kidney diease   Active Problems:   CLL (chronic lymphocytic leukemia) ([facility]) - used to follow with Dr. Julien Nordmann, will notify him of pt's admission via EPIC     Type 2 diabetes, HbA1c goal < 7% ([place]) with complications of nephropathy - recent A1C was 7.2 in July 2017 - continue Insulin detemir and SSI for now     Acute kidney injury superimposed on chronic kidney disease ([place]) - review of records indicate that three weeks ago, Cr was 3.2 - suspect mild increase in Cr up to 3.5 has to do with acute illness - Cr is now back to  3.2, will continue to monitor  - will stop IVF as pt more congested on exam with crackles at bases and mild dyspnea     Right renal cyst and masslike echogenic structure about the inferior bladder  - likely arising from the prostate gland as seen on prior CT - however, per renal US, a discrete bladder mass is difficult to exclude based on sonographic findings alone - Cystoscopy considered reasonable per radiologist, will discuss with urology team     Essential HTN - continue Coreg, clonidine, Hydralazine, Imdur per home medical regimen     Acute diastolic CHF - last ECHO in May 2017 with stable EF - pt now with more crackles on exam and LE swelling - stop IVF and monitor - may need to add lasix     Anemia of iron deficiency - continue Iron supplementation     Obesity  - Body mass index is 30.67 kg/m  DVT prophylaxis: Heparin Sq Code Status: Full  Family Communication: Patient at bedside  Disposition Plan: Home once sepsis etiology resolved and pt off IV ABX  Consultants:   None   Procedures:   None   Antimicrobials:   Vancomycin 9/12 --> 9/13  Zosyn 9/12 -->  Subjective: Pt reports feeling better but still with nausea.   Objective: Vitals:   04/01/16 2047 04/01/16 2133 04/01/16 2208 04/02/16 0536  BP: Marland Kitchen)  155/52 159/64 (!) 160/45 (!) 158/60  Pulse: 73 76 71 63  Resp: 18 16 (!) 22 20  Temp: 99.9 F (37.7 C)  99.3 F (37.4 C) 99.4 F (37.4 C)  TempSrc: Oral  Oral Oral  SpO2: 99% 98% 99% 100%  Weight:   112.8 kg (248 lb 10.9 oz)   Height:   6' 3.5" (1.918 m)     Intake/Output Summary (Last 24 hours) at 04/02/16 1330 Last data filed at 04/02/16 0549  Gross per 24 hour  Intake           948.75 ml  Output              550 ml  Net           398.75 ml   Filed Weights   04/01/16 1836 04/01/16 2208  Weight: 112.5 kg (248 lb) 112.8 kg (248 lb 10.9 oz)    Examination:  General exam: Appears calm and comfortable  Respiratory system: Crackles at bases  with diminished breath sounds  Cardiovascular system: S1 & S2 heard, RRR. No JVD, murmurs, rubs, gallops or clicks. LE edema +1  Gastrointestinal system: Abdomen is nondistended, soft and nontender. No organomegaly or masses felt.  Central nervous system: Alert and oriented. No focal neurological deficits.  Data Reviewed: I have personally reviewed following labs and imaging studies  CBC:  Recent Labs Lab 04/01/16 1827 04/01/16 1840 04/02/16 0542  WBC 19.5*  --  21.3*  NEUTROABS 10.9*  --   --   HGB 10.7* 10.9* 9.3*  HCT 31.8* 32.0* 27.9*  MCV 87.6  --  89.1  PLT 211  --  185   Basic Metabolic Panel:  Recent Labs Lab 04/01/16 1827 04/01/16 1840 04/02/16 0542  NA 141 140 137  K 4.7 4.7 4.6  CL 112* 111 110  CO2 20*  --  18*  GLUCOSE 74 71 185*  BUN 81* 71* 78*  CREATININE 3.23* 3.50* 3.22*  CALCIUM 9.6  --  8.6*   Liver Function Tests:  Recent Labs Lab 04/01/16 1827 04/02/16 0542  AST 18 15  ALT 24 21  ALKPHOS 55 40  BILITOT 0.8 0.4  PROT 7.2 5.9*  ALBUMIN 4.4 3.5   Cardiac Enzymes:  Recent Labs Lab 04/01/16 2221  TROPONINI 0.03*   CBG:  Recent Labs Lab 04/01/16 2215 04/02/16 0809 04/02/16 1121  GLUCAP 80 190* 162*   Urine analysis:    Component Value Date/Time   COLORURINE YELLOW 04/01/2016 1857   APPEARANCEUR CLEAR 04/01/2016 1857   LABSPEC 1.013 04/01/2016 1857   PHURINE 5.0 04/01/2016 1857   GLUCOSEU NEGATIVE 04/01/2016 1857   HGBUR NEGATIVE 04/01/2016 1857   BILIRUBINUR NEGATIVE 04/01/2016 1857   KETONESUR NEGATIVE 04/01/2016 1857   PROTEINUR 30 (A) 04/01/2016 1857   UROBILINOGEN 0.2 10/07/2011 1203   NITRITE NEGATIVE 04/01/2016 1857   LEUKOCYTESUR NEGATIVE 04/01/2016 1857   Radiology Studies: Dg Chest 2 View Result Date: 04/01/2016 CLINICAL DATA:  No acute pulmonary infiltrate or focal consolidation identified 2. Borderline enlargement of heart size with suggestion of mild central vascular congestion.   US Renal Result Date:  04/02/2016 CLINICAL DATA: No hydronephrosis or obstructive uropathy.  Right renal cyst. 2. Masslike echogenic structure about the inferior bladder is likely arising from the prostate gland as seen on prior CT, however a discrete bladder mass is difficult to exclude based on sonographic findings alone. Cystoscopy may be helpful.   Scheduled Meds: . aspirin EC  81 mg Oral Daily  .  brimonidine  1 drop Both Eyes BID  . carvedilol  25 mg Oral BID WC  . cholecalciferol  2,000 Units Oral Daily  . cloNIDine  0.1 mg Oral BID  . ferrous sulfate  325 mg Oral Q breakfast  . finasteride  5 mg Oral Daily  . heparin  5,000 Units Subcutaneous Q8H  . hydrALAZINE  50 mg Oral TID  . insulin aspart  0-9 Units Subcutaneous TID WC  . insulin detemir  5 Units Subcutaneous QHS  . isosorbide mononitrate  120 mg Oral Daily  . latanoprost  1 drop Both Eyes QHS  . minoxidil  2.5 mg Oral Daily  . multivitamin with minerals  1 tablet Oral Daily  . piperacillin-tazobactam (ZOSYN)  IV  3.375 g Intravenous Q8H  . prasugrel  10 mg Oral QHS  . sodium chloride flush  3 mL Intravenous Q12H  . tamsulosin  0.4 mg Oral QPC supper  . [START ON 04/03/2016] vancomycin  1,500 mg Intravenous Q48H  . vitamin C  1,000 mg Oral Daily   Continuous Infusions: . sodium chloride 75 mL/hr at 04/02/16 0330     LOS: 0 days    Time spent: 20 minutes    Faye Ramsay, MD [facility] Pager [phone]  If 7PM-7AM, please contact night-coverage [email or web address] Password [email or web address] 04/02/2016, 1:30 PM

## 2016-04-03 DIAGNOSIS — R509 Fever, unspecified: Secondary | ICD-10-CM

## 2016-04-03 DIAGNOSIS — C9110 Chronic lymphocytic leukemia of B-cell type not having achieved remission: Secondary | ICD-10-CM

## 2016-04-03 LAB — CBC
HCT: 28.4 % — ABNORMAL LOW (ref 39.0–52.0)
HEMOGLOBIN: 9.4 g/dL — AB (ref 13.0–17.0)
MCH: 29.5 pg (ref 26.0–34.0)
MCHC: 33.1 g/dL (ref 30.0–36.0)
MCV: 89 fL (ref 78.0–100.0)
Platelets: 175 10*3/uL (ref 150–400)
RBC: 3.19 MIL/uL — AB (ref 4.22–5.81)
RDW: 14.1 % (ref 11.5–15.5)
WBC: 16.3 10*3/uL — ABNORMAL HIGH (ref 4.0–10.5)

## 2016-04-03 LAB — GLUCOSE, CAPILLARY
GLUCOSE-CAPILLARY: 183 mg/dL — AB (ref 65–99)
GLUCOSE-CAPILLARY: 219 mg/dL — AB (ref 65–99)
GLUCOSE-CAPILLARY: 226 mg/dL — AB (ref 65–99)
Glucose-Capillary: 245 mg/dL — ABNORMAL HIGH (ref 65–99)

## 2016-04-03 LAB — URINE CULTURE

## 2016-04-03 LAB — BASIC METABOLIC PANEL
Anion gap: 7 (ref 5–15)
BUN: 84 mg/dL — AB (ref 6–20)
CHLORIDE: 109 mmol/L (ref 101–111)
CO2: 19 mmol/L — AB (ref 22–32)
Calcium: 8.7 mg/dL — ABNORMAL LOW (ref 8.9–10.3)
Creatinine, Ser: 3.41 mg/dL — ABNORMAL HIGH (ref 0.61–1.24)
GFR calc Af Amer: 18 mL/min — ABNORMAL LOW (ref >60)
GFR calc non Af Amer: 16 mL/min — ABNORMAL LOW (ref >60)
GLUCOSE: 194 mg/dL — AB (ref 65–99)
POTASSIUM: 4.6 mmol/L (ref 3.5–5.1)
Sodium: 135 mmol/L (ref 135–145)

## 2016-04-03 LAB — BLOOD CULTURE ID PANEL (REFLEXED)
Acinetobacter baumannii: NOT DETECTED
CANDIDA GLABRATA: NOT DETECTED
CANDIDA KRUSEI: NOT DETECTED
CANDIDA TROPICALIS: NOT DETECTED
Candida albicans: NOT DETECTED
Candida parapsilosis: NOT DETECTED
ENTEROBACTER CLOACAE COMPLEX: NOT DETECTED
ESCHERICHIA COLI: NOT DETECTED
Enterobacteriaceae species: NOT DETECTED
Enterococcus species: NOT DETECTED
Haemophilus influenzae: NOT DETECTED
KLEBSIELLA PNEUMONIAE: NOT DETECTED
Klebsiella oxytoca: NOT DETECTED
LISTERIA MONOCYTOGENES: NOT DETECTED
Methicillin resistance: NOT DETECTED
Neisseria meningitidis: NOT DETECTED
PROTEUS SPECIES: NOT DETECTED
Pseudomonas aeruginosa: NOT DETECTED
STAPHYLOCOCCUS SPECIES: DETECTED — AB
STREPTOCOCCUS AGALACTIAE: NOT DETECTED
Serratia marcescens: NOT DETECTED
Staphylococcus aureus (BCID): NOT DETECTED
Streptococcus pneumoniae: NOT DETECTED
Streptococcus pyogenes: NOT DETECTED
Streptococcus species: NOT DETECTED

## 2016-04-03 LAB — PHOSPHORUS: Phosphorus: 3.6 mg/dL (ref 2.5–4.6)

## 2016-04-03 LAB — UREA NITROGEN, URINE: Urea Nitrogen, Ur: 626 mg/dL

## 2016-04-03 MED ORDER — FUROSEMIDE 10 MG/ML IJ SOLN: 20.0000 mg | Freq: Once | INTRAMUSCULAR | Status: DC

## 2016-04-03 MED ORDER — GUAIFENESIN 100 MG/5ML PO SOLN
5.0000 mL | ORAL | Status: DC | PRN
  Administered 2016-04-03 – 2016-04-04 (×3): 100 mg via ORAL
  Filled 2016-04-03 (×3): qty 10

## 2016-04-03 MED ORDER — FUROSEMIDE 10 MG/ML IJ SOLN
40.0000 mg | Freq: Once | INTRAMUSCULAR | Status: AC
  Administered 2016-04-03: 40 mg via INTRAVENOUS
  Filled 2016-04-03: qty 4

## 2016-04-03 MED ORDER — FUROSEMIDE 10 MG/ML IJ SOLN
40.0000 mg | Freq: Every day | INTRAMUSCULAR | Status: DC
  Administered 2016-04-04 – 2016-04-05 (×2): 40 mg via INTRAVENOUS
  Filled 2016-04-03 (×2): qty 4

## 2016-04-03 NOTE — Consult Note (Signed)
[facility] Consult Note     Date: 04/03/2016                  Patient Name:  Ray Merritt  MRN: [id number]  DOB: 06/17/1936  Age / Sex: 80 y.o., male         PCP: FULP, CAMMIE, MD                 Service Requesting Consult: [facility]                 Reason for Consult: Acute on chronic kidney injury            Chief Complaint: weakness/ malaise/ fevers  HPI: Pt is a 17M with a PMH significant for CLL (previously noted to be in remission), HTN, HLD, CAD s/p stents, CKD Stage G IV A3, BPH, and uncontrolled DM type 2 who initially presented to [facility] hospital with complaints of weakness, unsteady gait, fevers, and malaise.  Now seen at the request of Dr. Doyle Askew for evaluation and recommendations surrouding acute on chronic kidney injury.  Briefly, pt's baseline creatinine appears to be 2.5-2.6 although most recently creatinine was around 3.2 on 03/11/2016.  He was admitted on 9/12 for the above-described constellation of symptoms which were concerning for sepsis; source undefined as of yet.  He was placed on vanc/ Zosyn.  Vanc was on 9/12-9/13 and Zosyn is still being given.  Creatinine was 3.2 on admission and has fluctuated between 3.2 and 3.5.  He has LE edema for which he reports taking 80 mg PO Lasix every other day.  Lasix 40 IV has been given here.  He thinks his LE edema is better than it is as an outpatient.    Blood cultures have been indicative of possible contamination.  New ones are pending as of 04/03/2016.  Previous culture on 9/12 have grown multiple isolates (contaminant) and a staph species which hasn't been fully speciated yet.    Of note, he presented similarly in May when he was diagnosed with a UTI.  Multiple species were present in the urine culture.    Past Medical History:  Diagnosis Date  . Anemia   . Arthritis   . Cancer ([place])   . Chronic kidney disease   . Coronary artery disease   . Diabetes mellitus    insulin dependent   . Enlarged  prostate   . Hypertension     Past Surgical History:  Procedure Laterality Date  . CARDIAC CATHETERIZATION N/A 05/01/2015   Procedure: Left Heart Cath and Coronary Angiography;  Surgeon: Adrian Prows, MD;  Location: [place] [facility];  Service: Cardiovascular;  Laterality: N/A;  . CARDIAC CATHETERIZATION  05/01/2015   Procedure: Intravascular Pressure Wire/FFR Study;  Surgeon: Adrian Prows, MD;  Location: [place] [facility];  Service: Cardiovascular;;  . CARDIAC CATHETERIZATION N/A 05/22/2015   Procedure: Left Heart Cath and Coronary Angiography;  Surgeon: Adrian Prows, MD;  Location: [place] [facility];  Service: Cardiovascular;  Laterality: N/A;  . CARDIAC CATHETERIZATION N/A 05/22/2015   Procedure: Coronary Stent Intervention;  Surgeon: Adrian Prows, MD;  Location: [place] [facility];  Service: Cardiovascular;  Laterality: N/A;  . SHOULDER SURGERY      Family History  Problem Relation Age of Onset  . Hypertension Mother   . Heart disease Sister   . Stroke Sister   . Diabetes Brother    Social History:  reports that he has quit smoking. He has never used  smokeless tobacco. He reports that he does not drink alcohol or use drugs.  Allergies: No Known Allergies  Medications Prior to Admission  Medication Sig Dispense Refill  . ALPHAGAN P 0.1 % SOLN Place 1 drop into both eyes 2 (two) times daily.  0  . Ascorbic Acid (VITAMIN C) 1000 MG tablet Take 1,000 mg by mouth daily.    Marland Kitchen aspirin 81 MG tablet Take 81 mg by mouth daily.    Marland Kitchen atorvastatin (LIPITOR) 10 MG tablet Take 20 mg by mouth daily.     . carvedilol (COREG) 25 MG tablet Take 25 mg by mouth 2 (two) times daily with a meal.   0  . Cholecalciferol (VITAMIN D) 2000 units tablet Take 2,000 Units by mouth daily.    . cloNIDine (CATAPRES) 0.1 MG tablet Take 0.1 mg by mouth 2 (two) times daily.  0  . diphenhydramine-acetaminophen (TYLENOL PM) 25-500 MG TABS tablet Take 2 tablets by mouth at bedtime.    . Ferrous Sulfate (IRON) 325 (65 Fe) MG  TABS Take 1 tablet by mouth daily.    . finasteride (PROSCAR) 5 MG tablet Take 5 mg by mouth daily.    . furosemide (LASIX) 40 MG tablet Take 40 mg by mouth daily.    . hydrALAZINE (APRESOLINE) 50 MG tablet Take 50 mg by mouth 3 (three) times daily.  0  . insulin NPH Human (HUMULIN N,NOVOLIN N) 100 UNIT/ML injection Inject 0.15 mLs (15 Units total) into the skin 2 (two) times daily before a meal. 10 mL 0  . isosorbide mononitrate (IMDUR) 120 MG 24 hr tablet Take 120 mg by mouth daily.    . minoxidil (LONITEN) 2.5 MG tablet Take 2.5 mg by mouth daily.    . Multiple Vitamin (MULTIVITAMIN WITH MINERALS) TABS tablet Take 1 tablet by mouth daily.    Marland Kitchen NOVOLIN R 100 UNIT/ML injection Inject 0.04 mLs (4 Units total) into the skin 2 (two) times daily before a meal. (Patient taking differently: Inject 6-7 Units into the skin 2 (two) times daily before a meal. Take 7 units in the am and Take 6 units at bedtime) 10 mL 0  . Omega-3 Fatty Acids (FISH OIL) 1200 MG CAPS Take 1 capsule by mouth daily.    . prasugrel (EFFIENT) 10 MG TABS tablet Take 1 tablet (10 mg total) by mouth daily. (Patient taking differently: Take 10 mg by mouth at bedtime. ) 30 tablet 0  . tamsulosin (FLOMAX) 0.4 MG CAPS capsule Take 0.4 mg by mouth daily after supper.    . Travoprost, BAK Free, (TRAVATAN) 0.004 % SOLN ophthalmic solution Place 1 drop into both eyes at bedtime.    . triamcinolone cream (KENALOG) 0.1 % Apply 1 application topically daily.   0  . glucose blood (BAYER CONTOUR NEXT TEST) test strip Use as instructed to check blood sugar 2 times per day dx code E11.65 (Patient taking differently: 1 each by Other route See admin instructions. Check blood sugar 3 times daily) 100 each 12    Results for orders placed or performed during the hospital encounter of 04/01/16 (from the past 48 hour(s))  Blood Culture (routine x 2)     Status: None (Preliminary result)   Collection Time: 04/01/16  6:25 PM  Result Value Ref Range    Specimen Description BLOOD RIGHT ANTECUBITAL    Special Requests BOTTLES DRAWN AEROBIC AND ANAEROBIC 4 CC EACH    Culture      NO GROWTH 2 DAYS Performed at [facility]  [facility]    Report Status PENDING   Comprehensive metabolic panel     Status: Abnormal   Collection Time: 04/01/16  6:27 PM  Result Value Ref Range   Sodium 141 135 - 145 mmol/L   Potassium 4.7 3.5 - 5.1 mmol/L   Chloride 112 (H) 101 - 111 mmol/L   CO2 20 (L) 22 - 32 mmol/L   Glucose, Bld 74 65 - 99 mg/dL   BUN 81 (H) 6 - 20 mg/dL   Creatinine, Ser 3.23 (H) 0.61 - 1.24 mg/dL   Calcium 9.6 8.9 - 10.3 mg/dL   Total Protein 7.2 6.5 - 8.1 g/dL   Albumin 4.4 3.5 - 5.0 g/dL   AST 18 15 - 41 U/L   ALT 24 17 - 63 U/L   Alkaline Phosphatase 55 38 - 126 U/L   Total Bilirubin 0.8 0.3 - 1.2 mg/dL   GFR calc non Af Amer 17 (L) >60 mL/min   GFR calc Af Amer 20 (L) >60 mL/min    Comment: (NOTE) The eGFR has been calculated using the CKD EPI equation. This calculation has not been validated in all clinical situations. eGFR's persistently <60 mL/min signify possible Chronic Kidney Disease.    Anion gap 9 5 - 15  CBC WITH DIFFERENTIAL     Status: Abnormal   Collection Time: 04/01/16  6:27 PM  Result Value Ref Range   WBC 19.5 (H) 4.0 - 10.5 K/uL   RBC 3.63 (L) 4.22 - 5.81 MIL/uL   Hemoglobin 10.7 (L) 13.0 - 17.0 g/dL   HCT 31.8 (L) 39.0 - 52.0 %   MCV 87.6 78.0 - 100.0 fL   MCH 29.5 26.0 - 34.0 pg   MCHC 33.6 30.0 - 36.0 g/dL   RDW 13.7 11.5 - 15.5 %   Platelets 211 150 - 400 K/uL   Neutrophils Relative % 56 %   Lymphocytes Relative 37 %   Monocytes Relative 6 %   Eosinophils Relative 1 %   Basophils Relative 0 %   Neutro Abs 10.9 (H) 1.7 - 7.7 K/uL   Lymphs Abs 7.2 (H) 0.7 - 4.0 K/uL   Monocytes Absolute 1.2 (H) 0.1 - 1.0 K/uL   Eosinophils Absolute 0.2 0.0 - 0.7 K/uL   Basophils Absolute 0.0 0.0 - 0.1 K/uL   WBC Morphology WHITE COUNT CONFIRMED ON SMEAR   Blood Culture (routine x 2)     Status: None (Preliminary  result)   Collection Time: 04/01/16  6:27 PM  Result Value Ref Range   Specimen Description BLOOD BLOOD LEFT FOREARM    Special Requests IN PEDIATRIC BOTTLE 1 CC    Culture  Setup Time      GRAM POSITIVE COCCI IN CLUSTERS IN PEDIATRIC BOTTLE Organism ID to follow CRITICAL RESULT CALLED TO, READ BACK BY AND VERIFIED WITH: Lavell Luster, PHARM AT 0535 ON [id number] BY M. KELLY    Culture      TOO YOUNG TO READ Performed at [facility]    Report Status PENDING   Blood Culture ID Panel (Reflexed)     Status: Abnormal   Collection Time: 04/01/16  6:27 PM  Result Value Ref Range   Enterococcus species NOT DETECTED NOT DETECTED   Listeria monocytogenes NOT DETECTED NOT DETECTED   Staphylococcus species DETECTED (A) NOT DETECTED    Comment: CRITICAL RESULT CALLED TO, READ BACK BY AND VERIFIED WITH: JULIAN GRIMSLEY,PHARMD '@[id number]'  04/03/16 MKELLY    Staphylococcus aureus NOT DETECTED NOT DETECTED   Methicillin  resistance NOT DETECTED NOT DETECTED   Streptococcus species NOT DETECTED NOT DETECTED   Streptococcus agalactiae NOT DETECTED NOT DETECTED   Streptococcus pneumoniae NOT DETECTED NOT DETECTED   Streptococcus pyogenes NOT DETECTED NOT DETECTED   Acinetobacter baumannii NOT DETECTED NOT DETECTED   Enterobacteriaceae species NOT DETECTED NOT DETECTED   Enterobacter cloacae complex NOT DETECTED NOT DETECTED   Escherichia coli NOT DETECTED NOT DETECTED   Klebsiella oxytoca NOT DETECTED NOT DETECTED   Klebsiella pneumoniae NOT DETECTED NOT DETECTED   Proteus species NOT DETECTED NOT DETECTED   Serratia marcescens NOT DETECTED NOT DETECTED   Haemophilus influenzae NOT DETECTED NOT DETECTED   Neisseria meningitidis NOT DETECTED NOT DETECTED   Pseudomonas aeruginosa NOT DETECTED NOT DETECTED   Candida albicans NOT DETECTED NOT DETECTED   Candida glabrata NOT DETECTED NOT DETECTED   Candida krusei NOT DETECTED NOT DETECTED   Candida parapsilosis NOT DETECTED NOT DETECTED   Candida  tropicalis NOT DETECTED NOT DETECTED    Comment: Performed at [facility]  I-stat chem 8, ed     Status: Abnormal   Collection Time: 04/01/16  6:40 PM  Result Value Ref Range   Sodium 140 135 - 145 mmol/L   Potassium 4.7 3.5 - 5.1 mmol/L   Chloride 111 101 - 111 mmol/L   BUN 71 (H) 6 - 20 mg/dL   Creatinine, Ser 3.50 (H) 0.61 - 1.24 mg/dL   Glucose, Bld 71 65 - 99 mg/dL   Calcium, Ion 1.20 1.15 - 1.40 mmol/L   TCO2 19 0 - 100 mmol/L   Hemoglobin 10.9 (L) 13.0 - 17.0 g/dL   HCT 32.0 (L) 39.0 - 52.0 %  I-Stat CG4 Lactic Acid, ED  (not at  [facility])     Status: None   Collection Time: 04/01/16  6:41 PM  Result Value Ref Range   Lactic Acid, Venous 1.46 0.5 - 1.9 mmol/L  Urinalysis, Routine w reflex microscopic (not at [facility])     Status: Abnormal   Collection Time: 04/01/16  6:57 PM  Result Value Ref Range   Color, Urine YELLOW YELLOW   APPearance CLEAR CLEAR   Specific Gravity, Urine 1.013 1.005 - 1.030   pH 5.0 5.0 - 8.0   Glucose, UA NEGATIVE NEGATIVE mg/dL   Hgb urine dipstick NEGATIVE NEGATIVE   Bilirubin Urine NEGATIVE NEGATIVE   Ketones, ur NEGATIVE NEGATIVE mg/dL   Protein, ur 30 (A) NEGATIVE mg/dL   Nitrite NEGATIVE NEGATIVE   Leukocytes, UA NEGATIVE NEGATIVE  Urine microscopic-add on     Status: None   Collection Time: 04/01/16  6:57 PM  Result Value Ref Range   Squamous Epithelial / LPF NONE SEEN NONE SEEN   WBC, UA NONE SEEN 0 - 5 WBC/hpf   RBC / HPF NONE SEEN 0 - 5 RBC/hpf   Bacteria, UA NONE SEEN NONE SEEN   Urine-Other      NO FORMED ELEMENTS SEEN ON URINE MICROSCOPIC EXAMINATION  Urine culture     Status: Abnormal   Collection Time: 04/01/16  6:58 PM  Result Value Ref Range   Specimen Description URINE, RANDOM    Special Requests NONE    Culture MULTIPLE SPECIES PRESENT, SUGGEST RECOLLECTION (A)    Report Status 04/03/2016 FINAL   CG4 I-STAT (Lactic acid)     Status: None   Collection Time: 04/01/16  9:39 PM  Result Value Ref Range   Lactic Acid,  Venous 0.66 0.5 - 1.9 mmol/L  Glucose, capillary  Status: None   Collection Time: 04/01/16 10:15 PM  Result Value Ref Range   Glucose-Capillary 80 65 - 99 mg/dL  Procalcitonin     Status: None   Collection Time: 04/01/16 10:21 PM  Result Value Ref Range   Procalcitonin 6.08 ng/mL    Comment:        Interpretation: PCT > 2 ng/mL: Systemic infection (sepsis) is likely, unless other causes are known. (NOTE)         ICU PCT Algorithm               Non ICU PCT Algorithm    ----------------------------     ------------------------------         PCT < 0.25 ng/mL                 PCT < 0.1 ng/mL     Stopping of antibiotics            Stopping of antibiotics       strongly encouraged.               strongly encouraged.    ----------------------------     ------------------------------       PCT level decrease by               PCT < 0.25 ng/mL       >= 80% from peak PCT       OR PCT 0.25 - 0.5 ng/mL          Stopping of antibiotics                                             encouraged.     Stopping of antibiotics           encouraged.    ----------------------------     ------------------------------       PCT level decrease by              PCT >= 0.25 ng/mL       < 80% from peak PCT        AND PCT >= 0.5 ng/mL            Continuing antibiotics                                               encouraged.       Continuing antibiotics            encouraged.    ----------------------------     ------------------------------     PCT level increase compared          PCT > 0.5 ng/mL         with peak PCT AND          PCT >= 0.5 ng/mL             Escalation of antibiotics                                          strongly encouraged.      Escalation of antibiotics        strongly encouraged. Performed at [facility]  Troponin I     Status: Abnormal   Collection Time: 04/01/16 10:21 PM  Result Value Ref Range   Troponin I 0.03 (HH) <0.03 ng/mL    Comment: CRITICAL RESULT CALLED  TO, READ BACK BY AND VERIFIED WITH: M.DELEON,RN 2321 04/01/16 W.SHEA   Brain natriuretic peptide     Status: Abnormal   Collection Time: 04/01/16 10:21 PM  Result Value Ref Range   B Natriuretic Peptide 135.2 (H) 0.0 - 100.0 pg/mL  Urea nitrogen, urine     Status: None   Collection Time: 04/01/16 10:44 PM  Result Value Ref Range   Urea Nitrogen, Ur 626 Not Estab. mg/dL    Comment: (NOTE) Performed At: [facility] [place], [place] [id number] Lindon Romp MD OV:[id number]   Creatinine, urine, random     Status: None   Collection Time: 04/01/16 10:44 PM  Result Value Ref Range   Creatinine, Urine 87.20 mg/dL    Comment: Performed at [facility]  CBC     Status: Abnormal   Collection Time: 04/02/16  5:42 AM  Result Value Ref Range   WBC 21.3 (H) 4.0 - 10.5 K/uL   RBC 3.13 (L) 4.22 - 5.81 MIL/uL   Hemoglobin 9.3 (L) 13.0 - 17.0 g/dL   HCT 27.9 (L) 39.0 - 52.0 %   MCV 89.1 78.0 - 100.0 fL   MCH 29.7 26.0 - 34.0 pg   MCHC 33.3 30.0 - 36.0 g/dL   RDW 14.2 11.5 - 15.5 %   Platelets 195 150 - 400 K/uL  Comprehensive metabolic panel     Status: Abnormal   Collection Time: 04/02/16  5:42 AM  Result Value Ref Range   Sodium 137 135 - 145 mmol/L   Potassium 4.6 3.5 - 5.1 mmol/L   Chloride 110 101 - 111 mmol/L   CO2 18 (L) 22 - 32 mmol/L   Glucose, Bld 185 (H) 65 - 99 mg/dL   BUN 78 (H) 6 - 20 mg/dL   Creatinine, Ser 3.22 (H) 0.61 - 1.24 mg/dL   Calcium 8.6 (L) 8.9 - 10.3 mg/dL   Total Protein 5.9 (L) 6.5 - 8.1 g/dL   Albumin 3.5 3.5 - 5.0 g/dL   AST 15 15 - 41 U/L   ALT 21 17 - 63 U/L   Alkaline Phosphatase 40 38 - 126 U/L   Total Bilirubin 0.4 0.3 - 1.2 mg/dL   GFR calc non Af Amer 17 (L) >60 mL/min   GFR calc Af Amer 20 (L) >60 mL/min    Comment: (NOTE) The eGFR has been calculated using the CKD EPI equation. This calculation has not been validated in all clinical situations. eGFR's persistently <60 mL/min signify possible Chronic  Kidney Disease.    Anion gap 9 5 - 15  Glucose, capillary     Status: Abnormal   Collection Time: 04/02/16  8:09 AM  Result Value Ref Range   Glucose-Capillary 190 (H) 65 - 99 mg/dL  Glucose, capillary     Status: Abnormal   Collection Time: 04/02/16 11:21 AM  Result Value Ref Range   Glucose-Capillary 162 (H) 65 - 99 mg/dL  Glucose, capillary     Status: Abnormal   Collection Time: 04/02/16  4:04 PM  Result Value Ref Range   Glucose-Capillary 256 (H) 65 - 99 mg/dL  Glucose, capillary     Status: Abnormal   Collection Time: 04/02/16  9:26 PM  Result Value Ref Range   Glucose-Capillary 234 (H) 65 -  99 mg/dL  CBC     Status: Abnormal   Collection Time: 04/03/16  4:57 AM  Result Value Ref Range   WBC 16.3 (H) 4.0 - 10.5 K/uL   RBC 3.19 (L) 4.22 - 5.81 MIL/uL   Hemoglobin 9.4 (L) 13.0 - 17.0 g/dL   HCT 28.4 (L) 39.0 - 52.0 %   MCV 89.0 78.0 - 100.0 fL   MCH 29.5 26.0 - 34.0 pg   MCHC 33.1 30.0 - 36.0 g/dL   RDW 14.1 11.5 - 15.5 %   Platelets 175 150 - 400 K/uL  Basic metabolic panel     Status: Abnormal   Collection Time: 04/03/16  4:57 AM  Result Value Ref Range   Sodium 135 135 - 145 mmol/L   Potassium 4.6 3.5 - 5.1 mmol/L   Chloride 109 101 - 111 mmol/L   CO2 19 (L) 22 - 32 mmol/L   Glucose, Bld 194 (H) 65 - 99 mg/dL   BUN 84 (H) 6 - 20 mg/dL   Creatinine, Ser 3.41 (H) 0.61 - 1.24 mg/dL   Calcium 8.7 (L) 8.9 - 10.3 mg/dL   GFR calc non Af Amer 16 (L) >60 mL/min   GFR calc Af Amer 18 (L) >60 mL/min    Comment: (NOTE) The eGFR has been calculated using the CKD EPI equation. This calculation has not been validated in all clinical situations. eGFR's persistently <60 mL/min signify possible Chronic Kidney Disease.    Anion gap 7 5 - 15  Glucose, capillary     Status: Abnormal   Collection Time: 04/03/16  7:41 AM  Result Value Ref Range   Glucose-Capillary 183 (H) 65 - 99 mg/dL  Glucose, capillary     Status: Abnormal   Collection Time: 04/03/16 11:56 AM  Result  Value Ref Range   Glucose-Capillary 219 (H) 65 - 99 mg/dL  Glucose, capillary     Status: Abnormal   Collection Time: 04/03/16  4:58 PM  Result Value Ref Range   Glucose-Capillary 245 (H) 65 - 99 mg/dL   Dg Chest 2 View  Result Date: 04/01/2016 CLINICAL DATA:  80 year old male complains of shortness of breath, cough and emesis EXAM: CHEST  2 VIEW COMPARISON:  12/10/2015, 03/17/2000 FINDINGS: AP and lateral views of the chest. No acute consolidation or pleural effusion identified. Borderline enlargement of the heart size. Suggestion of mild central vascular congestion. No overt edema. Atherosclerosis of the aortic arch. No pneumothorax. IMPRESSION: 1. No acute pulmonary infiltrate or focal consolidation identified 2. Borderline enlargement of heart size with suggestion of mild central vascular congestion. Electronically Signed   By: Donavan Foil M.D.   On: 04/01/2016 19:22   Ct Chest Wo Contrast  Result Date: 04/02/2016 CLINICAL DATA:  Dyspnea.  Fever, chills, weakness. EXAM: CT CHEST WITHOUT CONTRAST TECHNIQUE: Multidetector CT imaging of the chest was performed following the standard protocol without IV contrast. COMPARISON:  Chest radiograph yesterday. FINDINGS: Cardiovascular: Coronary artery calcifications versus stent. Borderline mild multi chamber cardiomegaly. Mild atherosclerosis of the thoracic aorta without aneurysm or periaortic soft tissue stranding Mediastinum/Nodes: Calcified right hilar lymph nodes, sequela of prior granulomatous disease. Small noncalcified mediastinal nodes are not enlarged by size criteria. Similar small axillary nodes, not enlarged. No definite pathologic hilar adenopathy. No pericardial fluid. Heterogeneous enlargement of the left thyroid gland, previously characterized on thyroid ultrasound September 2016. Lungs/Pleura: Mild apical predominant emphysema. No focal airspace opacity or pulmonary edema. No pulmonary mass or suspicious nodule. No pleural fluid. Scattered  fissural thickening. Trachea  and mainstem bronchi are patent. Upper Abdomen: No acute abnormality. Musculoskeletal: Well-defined lucency within the right scapula is peripheral sclerotic border and no suspicious features. Small subcentimeter lucency in mid thoracic vertebra, unchanged from CT 09/30/2011 and considered benign. No suspicious destructive or sclerotic osseous lesions. IMPRESSION: 1. No acute abnormality. 2. Minimal emphysema and bronchial thickening. 3. Thoracic aortic atherosclerosis. Coronary artery calcifications/stents. Electronically Signed   By: Jeb Levering M.D.   On: 04/02/2016 18:59   US Renal  Result Date: 04/02/2016 CLINICAL DATA:  Acute kidney injury. EXAM: RENAL / URINARY TRACT ULTRASOUND COMPLETE COMPARISON:  CT 09/30/2011 FINDINGS: Right Kidney: Length: 12.4 cm. There is thinning of the renal parenchyma. Echogenicity within normal limits. Cyst in the mid upper kidney measures 2.9 x 2.3 x 2.0 cm. No mass or hydronephrosis visualized. Left Kidney: Length: 11.6 cm. There is thinning of the renal parenchyma. Echogenicity within normal limits. No mass or hydronephrosis visualized. Bladder: Physiologically distended. Rounded masslike echogenic structure measures 5.0 x 3.9 x 5.6 cm inferiorly, likely prostate as seen on prior CT. IMPRESSION: 1. No hydronephrosis or obstructive uropathy.  Right renal cyst. 2. Masslike echogenic structure about the inferior bladder is likely arising from the prostate gland as seen on prior CT, however a discrete bladder mass is difficult to exclude based on sonographic findings alone. Cystoscopy may be helpful. Electronically Signed   By: Jeb Levering M.D.   On: 04/02/2016 02:40    Review of Systems  All other systems reviewed and are negative.   Blood pressure (!) 147/59, pulse (!) 56, temperature 98.7 F (37.1 C), temperature source Oral, resp. rate 18, height 6' 3.5" (1.918 m), weight 116.5 kg (256 lb 13.4 oz), SpO2 99 %. Physical Exam   GEN: elderly man, NAD, eating dinner HEENT: EOMI, PERRL, sclerae anicteric NECK: minimal JVD PULM: nL WOB, R basilar crackles sound but otherwise clear CV : RRR no m/r/g ABD: soft, obese, nontender, NABS EXT: 1+ LE edema NEURO: AAO x 3 SKIN: no rashes or lesions, no subungual hemorrhages   Assessment/Plan  1.  Acute on chronic kidney disease:  Suspect a combination of septic AKI and nephrotoxicity from vanc/ Zosyn.  Zosyn can also cause AIN but would expect creatinine to continue to rise rather than plateau.  Underlying kidney disease thought to be due to DM and HTN without obstructive component despite large prostate on renal US .  I expect renal function to recover, but this may take several days.  2.  Sepsis, source undetermined:  Had a previous admission in May for similar symptoms.  Cultures aren't showing anything definitive yet although there are 1/2 blood cultures growing a staph species and we are awaiting a repeat urine culture although UA from 9/12 was negative.  He does not have back pain, toothache, or any stigmata of endocarditis.  Continuing Zosyn per primary.  Agree with holding vanc, if he spikes another fever can add back MRSA coverage.  3.  Volume overload: mild LE edema and R basilar crackles.  Agree with d/c IVFs.  He takes Lasix 80 mg PO every other day at home; would do Lasix 40 IV daily to help pt achieve euvolemia.  Would hold if creatinine were to rise again but in this case would not add back IVF; would allow pt to autoregulate as long as pt is non-oliguric and able to drink to thirst   4.  Acute diastolic CHF: Cardiology consulted; EF 50-55% 11/2015.  Volume management as above.   On Coreg, hydralazine, imdur,  minoxidil, and clonidine.  Bps still high; thing that some volume removal may help this.  Pt mildly bradycardic to 50s; could consider tapering clonidine and adding amlodipine if this becomes more of an issue.    5. Questionable bladder mass: urology  consulting; possible cystoscopy  6.  Anemia: on ferrous sulfate 325 mg daily; adding on Tsat and iron panel, pt may need ESA  7.  Bone/ mineral metabolism: on Vit D daily.  Adding on Phos.     Madelon Lips, MD 04/03/2016, 6:13 PM

## 2016-04-03 NOTE — Progress Notes (Signed)
PHARMACY - PHYSICIAN COMMUNICATION CRITICAL VALUE ALERT - BLOOD CULTURE IDENTIFICATION (BCID)  Results for orders placed or performed during the hospital encounter of 04/01/16  Blood Culture ID Panel (Reflexed) (Collected: 04/01/2016  6:27 PM)  Result Value Ref Range   Enterococcus species NOT DETECTED NOT DETECTED   Listeria monocytogenes NOT DETECTED NOT DETECTED   Staphylococcus species DETECTED (A) NOT DETECTED   Staphylococcus aureus NOT DETECTED NOT DETECTED   Methicillin resistance NOT DETECTED NOT DETECTED   Streptococcus species NOT DETECTED NOT DETECTED   Streptococcus agalactiae NOT DETECTED NOT DETECTED   Streptococcus pneumoniae NOT DETECTED NOT DETECTED   Streptococcus pyogenes NOT DETECTED NOT DETECTED   Acinetobacter baumannii NOT DETECTED NOT DETECTED   Enterobacteriaceae species NOT DETECTED NOT DETECTED   Enterobacter cloacae complex NOT DETECTED NOT DETECTED   Escherichia coli NOT DETECTED NOT DETECTED   Klebsiella oxytoca NOT DETECTED NOT DETECTED   Klebsiella pneumoniae NOT DETECTED NOT DETECTED   Proteus species NOT DETECTED NOT DETECTED   Serratia marcescens NOT DETECTED NOT DETECTED   Haemophilus influenzae NOT DETECTED NOT DETECTED   Neisseria meningitidis NOT DETECTED NOT DETECTED   Pseudomonas aeruginosa NOT DETECTED NOT DETECTED   Candida albicans NOT DETECTED NOT DETECTED   Candida glabrata NOT DETECTED NOT DETECTED   Candida krusei NOT DETECTED NOT DETECTED   Candida parapsilosis NOT DETECTED NOT DETECTED   Candida tropicalis NOT DETECTED NOT DETECTED    Name of physician (or Provider) Contacted: K. Schorr.  Changes to prescribed antibiotics required: None at this time.  Since results were only 1 out of 2 cultures, per lab, there is a concern for contamination vs infection.  Will wait until second results  Ray Merritt [name] 04/03/2016  5:56 AM

## 2016-04-03 NOTE — Progress Notes (Signed)
Patient ID: Ray Merritt, male   DOB: Jul 04, 1936, 80 y.o.   MRN: [id number]    PROGRESS NOTE    Ray Merritt  ERX:[id number] DOB: 1936-03-15 DOA: 04/01/2016  PCP: Antony Blackbird, MD   Brief Narrative:  80 y.o. male with medical history significant of CLL (previously noted to be in remission), HTN, HLD, CAD s/p stents, CKD, BPH, and uncontrolled DM type 2; who presented with main concern of 1-2 days duration of poor oral intake, nausea and few episodes of non bloody vomiting. This was associated with subjective fevers, chills, confusion, unsteady gait. Pt reported similar event in the past in May 2017 when he was admitted for sepsis due to UTI. Pt explained that his dyspnea has been present for longer time and was associated with LE swelling, no specific weight change but he has noted he was unable to lay down flat.   Assessment & Plan:   Principal Problem:   Sepsis of unknown etiology, Possibly UTI  - please note that pt met sepsis criteria on admission with with T 101, WBC 19K, procalcitonin > 6, RR up to 30 - source is not clear and urine culture with multiple sp so recollection was recommended which will do today - blood cultures 1/2 positive for staph but could be contaminant so we need to wait for other culture  - Ct chest and CXR with no evidence of PNA but bronchitis can not be excluded  - continue zosyn day #3 until source determined  - if blood cultures turn out positive for staph we can add vanc but will hold for now as pt is better and only one out of two cultures positive so again could be contamination   Active Problems:   CLL (chronic lymphocytic leukemia) (HCC) - used to follow with Dr. Julien Nordmann, notified him of pt's admission via EPIC     Type 2 diabetes, HbA1c goal < 7% ([place]) with complications of nephropathy - recent A1C was 7.2 in July 2017 - continue Insulin detemir and SSI for now     Acute kidney injury superimposed on chronic kidney disease ([place]) - review of  records indicate that three weeks ago, Cr was 3.2 - suspect mild increase in Cr up to 3.5 has to do with acute illness - stopped fluid 9/13 as pt with some volume overload - placed on lasix 40 mg IV to see how pt does - nephrologist consulted for assistance, pt sees Dr. Florene Glen     Right renal cyst and masslike echogenic structure about the inferior bladder  - likely arising from the prostate gland as seen on prior CT - however, per renal US, a discrete bladder mass is difficult to exclude based on sonographic findings alone - Cystoscopy considered reasonable per radiologist, will discuss with urology team     Essential HTN - continue Coreg, clonidine, Hydralazine, Imdur per home medical regimen     Acute diastolic CHF - last ECHO in May 2017 with stable EF - pt now with more crackles on exam and LE swelling - IVF have been discontinued 9/13 and cardiology consulted for assistance, pt follows with Dr. Einar Gip     Anemia of iron deficiency - continue Iron supplementation     Obesity  - Body mass index is 30.67 kg/m  DVT prophylaxis: Heparin Sq Code Status: Full  Family Communication: Patient at bedside, family at bedside  Disposition Plan: Home once sepsis etiology resolved and pt off IV ABX  Consultants:   Nephrology  Cardiology  Procedures:   None   Antimicrobials:   Vancomycin 9/12 --> 9/13  Zosyn 9/12 -->  Subjective: Pt reports feeling better but still with nausea.   Objective: Vitals:   04/02/16 1527 04/02/16 2129 04/03/16 0527 04/03/16 1435  BP: (!) 150/50 (!) 146/52 (!) 146/57 (!) 147/59  Pulse: 61 64 (!) 58 (!) 56  Resp: _0 Temp: 98.6 F (37 C) 98.6 F (37 C) 98.3 F (36.8 C) 98.7 F (37.1 C)  TempSrc: Oral Oral Oral Oral  SpO2: 100% 99% 99% 99%  Weight:   116.5 kg (256 lb 13.4 oz)   Height:        Intake/Output Summary (Last 24 hours) at 04/03/16 1445 Last data filed at 04/03/16 1250  Gross per 24 hour  Intake              820 ml    Output             1225 ml  Net             -405 ml   Filed Weights   04/01/16 1836 04/01/16 2208 04/03/16 0527  Weight: 112.5 kg (248 lb) 112.8 kg (248 lb 10.9 oz) 116.5 kg (256 lb 13.4 oz)    Examination:  General exam: Appears calm and comfortable  Respiratory system: Crackles at bases with diminished breath sounds  Cardiovascular system: S1 & S2 heard, RRR. No JVD, murmurs, rubs, gallops or clicks. LE edema +2 Gastrointestinal system: Abdomen is nondistended, soft and nontender. No organomegaly or masses felt.  Central nervous system: Alert and oriented. No focal neurological deficits.  Data Reviewed: I have personally reviewed following labs and imaging studies  CBC:  Recent Labs Lab 04/01/16 1827 04/01/16 1840 04/02/16 0542 04/03/16 0457  WBC 19.5*  --  21.3* 16.3*  NEUTROABS 10.9*  --   --   --   HGB 10.7* 10.9* 9.3* 9.4*  HCT 31.8* 32.0* 27.9* 28.4*  MCV 87.6  --  89.1 89.0  PLT 211  --  195 892   Basic Metabolic Panel:  Recent Labs Lab 04/01/16 1827 04/01/16 1840 04/02/16 0542 04/03/16 0457  NA 141 140 137 135  K 4.7 4.7 4.6 4.6  CL 112* 111 110 109  CO2 20*  --  18* 19*  GLUCOSE 74 71 185* 194*  BUN 81* 71* 78* 84*  CREATININE 3.23* 3.50* 3.22* 3.41*  CALCIUM 9.6  --  8.6* 8.7*   Liver Function Tests:  Recent Labs Lab 04/01/16 1827 04/02/16 0542  AST 18 15  ALT 24 21  ALKPHOS 55 40  BILITOT 0.8 0.4  PROT 7.2 5.9*  ALBUMIN 4.4 3.5   Cardiac Enzymes:  Recent Labs Lab 04/01/16 2221  TROPONINI 0.03*   CBG:  Recent Labs Lab 04/02/16 1121 04/02/16 1604 04/02/16 2126 04/03/16 0741 04/03/16 1156  GLUCAP 162* 256* 234* 183* 219*   Urine analysis:    Component Value Date/Time   COLORURINE YELLOW 04/01/2016 1857   APPEARANCEUR CLEAR 04/01/2016 1857   LABSPEC 1.013 04/01/2016 1857   PHURINE 5.0 04/01/2016 1857   GLUCOSEU NEGATIVE 04/01/2016 1857   HGBUR NEGATIVE 04/01/2016 1857   BILIRUBINUR NEGATIVE 04/01/2016 1857    KETONESUR NEGATIVE 04/01/2016 1857   PROTEINUR 30 (A) 04/01/2016 1857   UROBILINOGEN 0.2 10/07/2011 1203   NITRITE NEGATIVE 04/01/2016 1857   LEUKOCYTESUR NEGATIVE 04/01/2016 1857   Radiology Studies: Dg Chest 2 View Result Date: 04/01/2016 CLINICAL DATA:  No acute pulmonary infiltrate or focal consolidation  identified 2. Borderline enlargement of heart size with suggestion of mild central vascular congestion.   US Renal Result Date: 04/02/2016 CLINICAL DATA: No hydronephrosis or obstructive uropathy.  Right renal cyst. 2. Masslike echogenic structure about the inferior bladder is likely arising from the prostate gland as seen on prior CT, however a discrete bladder mass is difficult to exclude based on sonographic findings alone. Cystoscopy may be helpful.   Scheduled Meds: . aspirin EC  81 mg Oral Daily  . brimonidine  1 drop Both Eyes BID  . carvedilol  25 mg Oral BID WC  . cholecalciferol  2,000 Units Oral Daily  . cloNIDine  0.1 mg Oral BID  . ferrous sulfate  325 mg Oral Q breakfast  . finasteride  5 mg Oral Daily  . heparin  5,000 Units Subcutaneous Q8H  . hydrALAZINE  50 mg Oral TID  . insulin aspart  0-9 Units Subcutaneous TID WC  . insulin detemir  5 Units Subcutaneous QHS  . isosorbide mononitrate  120 mg Oral Daily  . latanoprost  1 drop Both Eyes QHS  . minoxidil  2.5 mg Oral Daily  . multivitamin with minerals  1 tablet Oral Daily  . piperacillin-tazobactam (ZOSYN)  IV  3.375 g Intravenous Q8H  . prasugrel  10 mg Oral QHS  . sodium chloride flush  3 mL Intravenous Q12H  . tamsulosin  0.4 mg Oral QPC supper  . vitamin C  1,000 mg Oral Daily   Continuous Infusions:     LOS: 1 day    Time spent: 20 minutes    Faye Ramsay, MD [facility] Pager [phone]  If 7PM-7AM, please contact night-coverage [email or web address] Password [email or web address] 04/03/2016, 2:45 PM

## 2016-04-04 LAB — URINE CULTURE: CULTURE: NO GROWTH

## 2016-04-04 LAB — BASIC METABOLIC PANEL
Anion gap: 10 (ref 5–15)
BUN: 83 mg/dL — AB (ref 6–20)
CHLORIDE: 107 mmol/L (ref 101–111)
CO2: 18 mmol/L — AB (ref 22–32)
Calcium: 8.9 mg/dL (ref 8.9–10.3)
Creatinine, Ser: 3.37 mg/dL — ABNORMAL HIGH (ref 0.61–1.24)
GFR calc Af Amer: 19 mL/min — ABNORMAL LOW (ref >60)
GFR calc non Af Amer: 16 mL/min — ABNORMAL LOW (ref >60)
Glucose, Bld: 191 mg/dL — ABNORMAL HIGH (ref 65–99)
POTASSIUM: 4.5 mmol/L (ref 3.5–5.1)
SODIUM: 135 mmol/L (ref 135–145)

## 2016-04-04 LAB — GLUCOSE, CAPILLARY
GLUCOSE-CAPILLARY: 202 mg/dL — AB (ref 65–99)
GLUCOSE-CAPILLARY: 222 mg/dL — AB (ref 65–99)
GLUCOSE-CAPILLARY: 201 mg/dL — AB (ref 65–99)
Glucose-Capillary: 257 mg/dL — ABNORMAL HIGH (ref 65–99)

## 2016-04-04 LAB — IRON AND TIBC
Iron: 38 ug/dL — ABNORMAL LOW (ref 45–182)
SATURATION RATIOS: 14 % — AB (ref 17.9–39.5)
TIBC: 263 ug/dL (ref 250–450)
UIBC: 225 ug/dL

## 2016-04-04 LAB — C DIFFICILE QUICK SCREEN W PCR REFLEX
C DIFFICILE (CDIFF) INTERP: NOT DETECTED
C DIFFICILE (CDIFF) TOXIN: NEGATIVE
C Diff antigen: NEGATIVE

## 2016-04-04 LAB — CBC
HEMATOCRIT: 27.2 % — AB (ref 39.0–52.0)
HEMOGLOBIN: 9.2 g/dL — AB (ref 13.0–17.0)
MCH: 29 pg (ref 26.0–34.0)
MCHC: 33.8 g/dL (ref 30.0–36.0)
MCV: 85.8 fL (ref 78.0–100.0)
Platelets: 180 10*3/uL (ref 150–400)
RBC: 3.17 MIL/uL — AB (ref 4.22–5.81)
RDW: 13.7 % (ref 11.5–15.5)
WBC: 13.3 10*3/uL — ABNORMAL HIGH (ref 4.0–10.5)

## 2016-04-04 LAB — CULTURE, BLOOD (ROUTINE X 2)

## 2016-04-04 MED ORDER — SODIUM BICARBONATE 650 MG PO TABS
650.0000 mg | ORAL_TABLET | Freq: Three times a day (TID) | ORAL | Status: DC
  Administered 2016-04-04 – 2016-04-05 (×3): 650 mg via ORAL
  Filled 2016-04-04 (×3): qty 1

## 2016-04-04 MED ORDER — LORATADINE 10 MG PO TABS
10.0000 mg | ORAL_TABLET | Freq: Every day | ORAL | Status: DC
  Administered 2016-04-05 (×2): 10 mg via ORAL
  Filled 2016-04-04 (×2): qty 1

## 2016-04-04 NOTE — Progress Notes (Signed)
C.diff negative. enteric precautions discontinued.  Barbee [place]. Brigitte Pulse, RN

## 2016-04-04 NOTE — Progress Notes (Signed)
Inpatient Diabetes Program Recommendations  AACE/ADA: New Consensus Statement on Inpatient Glycemic Control (2015)  Target Ranges:  Prepandial:   less than 140 mg/dL      Peak postprandial:   less than 180 mg/dL (1-2 hours)      Critically ill patients:  140 - 180 mg/dL   Lab Results  Component Value Date   GLUCAP 202 (H) 04/04/2016   HGBA1C 7.3 (H) 01/21/2016    Review of Glycemic Control  Eating 100%. Post-prandial blood sugars elevated. FBS > 180 mg/dL.  Inpatient Diabetes Program Recommendations:    Consider addition of Novolog 3 units tidwc for meal coverage insulin. May benefit from increasing Levemir to 7 units QHS.  Will continue to follow. Thank you. Lorenda Peck, RD, LDN, CDE Inpatient Diabetes Coordinator [phone]

## 2016-04-04 NOTE — Progress Notes (Signed)
[facility] ROUNDING NOTE   Subjective:   Interval History: feels somewhat better with diuresis to lasix   Objective:  Vital signs in last 24 hours:  Temp:  [98.7 F (37.1 C)] 98.7 F (37.1 C) (09/15 0555) Pulse Rate:  [56-64] 63 (09/15 0555) Resp:  [18] 18 (09/15 0555) BP: (147-152)/(51-59) 152/59 (09/15 0555) SpO2:  [99 %-100 %] 99 % (09/15 0555) Weight:  [114.8 kg (253 lb 1.4 oz)] 114.8 kg (253 lb 1.4 oz) (09/15 0500)  Weight change: -1.7 kg (-3 lb 12 oz) Filed Weights   04/01/16 2208 04/03/16 0527 04/04/16 0500  Weight: 112.8 kg (248 lb 10.9 oz) 116.5 kg (256 lb 13.4 oz) 114.8 kg (253 lb 1.4 oz)    Intake/Output: I/O last 3 completed shifts: In: 1210 [P.O.:960; IV Piggyback:250] Out: 1800 [Urine:1800]   Intake/Output this shift:  Total I/O In: 240 [P.O.:240] Out: 950 [Urine:950]  CVS- RRR RS- CTA  Diminished at bases  ABD- BS present soft non-distended EXT- trace  edema   Basic Metabolic Panel:  Recent Labs Lab 04/01/16 1827 04/01/16 1840 04/02/16 0542 04/03/16 0457 04/03/16 0505 04/04/16 0522  NA 141 140 137 135  --  135  K 4.7 4.7 4.6 4.6  --  4.5  CL 112* 111 110 109  --  107  CO2 20*  --  18* 19*  --  18*  GLUCOSE 74 71 185* 194*  --  191*  BUN 81* 71* 78* 84*  --  83*  CREATININE 3.23* 3.50* 3.22* 3.41*  --  3.37*  CALCIUM 9.6  --  8.6* 8.7*  --  8.9  PHOS  --   --   --   --  3.6  --     Liver Function Tests:  Recent Labs Lab 04/01/16 1827 04/02/16 0542  AST 18 15  ALT 24 21  ALKPHOS 55 40  BILITOT 0.8 0.4  PROT 7.2 5.9*  ALBUMIN 4.4 3.5   No results for input(s): LIPASE, AMYLASE in the last 168 hours. No results for input(s): AMMONIA in the last 168 hours.  CBC:  Recent Labs Lab 04/01/16 1827 04/01/16 1840 04/02/16 0542 04/03/16 0457 04/04/16 0522  WBC 19.5*  --  21.3* 16.3* 13.3*  NEUTROABS 10.9*  --   --   --   --   HGB 10.7* 10.9* 9.3* 9.4* 9.2*  HCT 31.8* 32.0* 27.9* 28.4* 27.2*  MCV 87.6  --  89.1  89.0 85.8  PLT 211  --  195 175 180    Cardiac Enzymes:  Recent Labs Lab 04/01/16 2221  TROPONINI 0.03*    BNP: Invalid input(s): POCBNP  CBG:  Recent Labs Lab 04/03/16 0741 04/03/16 1156 04/03/16 1658 04/03/16 2112 04/04/16 0748  GLUCAP 183* 219* 245* 226* 202*    Microbiology: Results for orders placed or performed during the hospital encounter of 04/01/16  Blood Culture (routine x 2)     Status: None (Preliminary result)   Collection Time: 04/01/16  6:25 PM  Result Value Ref Range Status   Specimen Description BLOOD RIGHT ANTECUBITAL  Final   Special Requests BOTTLES DRAWN AEROBIC AND ANAEROBIC 4 CC EACH  Final   Culture   Final    NO GROWTH 2 DAYS Performed at [facility]    Report Status PENDING  Incomplete  Blood Culture (routine x 2)     Status: Abnormal   Collection Time: 04/01/16  6:27 PM  Result Value Ref Range Status   Specimen Description BLOOD BLOOD  LEFT FOREARM  Final   Special Requests IN PEDIATRIC BOTTLE 1 CC  Final   Culture  Setup Time   Final    GRAM POSITIVE COCCI IN CLUSTERS IN PEDIATRIC BOTTLE CRITICAL RESULT CALLED TO, READ BACK BY AND VERIFIED WITH: J. [place], PHARM AT [place] ON [id number] BY M. KELLY    Culture (A)  Final    STAPHYLOCOCCUS SPECIES (COAGULASE NEGATIVE) THE SIGNIFICANCE OF ISOLATING THIS ORGANISM FROM A SINGLE SET OF BLOOD CULTURES WHEN MULTIPLE SETS ARE DRAWN IS UNCERTAIN. PLEASE NOTIFY THE MICROBIOLOGY DEPARTMENT WITHIN ONE WEEK IF SPECIATION AND SENSITIVITIES ARE REQUIRED. Performed at [facility]    Report Status 04/04/2016 FINAL  Final  Blood Culture ID Panel (Reflexed)     Status: Abnormal   Collection Time: 04/01/16  6:27 PM  Result Value Ref Range Status   Enterococcus species NOT DETECTED NOT DETECTED Final   Listeria monocytogenes NOT DETECTED NOT DETECTED Final   Staphylococcus species DETECTED (A) NOT DETECTED Final    Comment: CRITICAL RESULT CALLED TO, READ BACK BY AND VERIFIED WITH: JULIAN  GRIMSLEY,PHARMD @0535  04/03/16 MKELLY    Staphylococcus aureus NOT DETECTED NOT DETECTED Final   Methicillin resistance NOT DETECTED NOT DETECTED Final   Streptococcus species NOT DETECTED NOT DETECTED Final   Streptococcus agalactiae NOT DETECTED NOT DETECTED Final   Streptococcus pneumoniae NOT DETECTED NOT DETECTED Final   Streptococcus pyogenes NOT DETECTED NOT DETECTED Final   Acinetobacter baumannii NOT DETECTED NOT DETECTED Final   Enterobacteriaceae species NOT DETECTED NOT DETECTED Final   Enterobacter cloacae complex NOT DETECTED NOT DETECTED Final   Escherichia coli NOT DETECTED NOT DETECTED Final   Klebsiella oxytoca NOT DETECTED NOT DETECTED Final   Klebsiella pneumoniae NOT DETECTED NOT DETECTED Final   Proteus species NOT DETECTED NOT DETECTED Final   Serratia marcescens NOT DETECTED NOT DETECTED Final   Haemophilus influenzae NOT DETECTED NOT DETECTED Final   Neisseria meningitidis NOT DETECTED NOT DETECTED Final   Pseudomonas aeruginosa NOT DETECTED NOT DETECTED Final   Candida albicans NOT DETECTED NOT DETECTED Final   Candida glabrata NOT DETECTED NOT DETECTED Final   Candida krusei NOT DETECTED NOT DETECTED Final   Candida parapsilosis NOT DETECTED NOT DETECTED Final   Candida tropicalis NOT DETECTED NOT DETECTED Final    Comment: Performed at [facility]  Urine culture     Status: Abnormal   Collection Time: 04/01/16  6:58 PM  Result Value Ref Range Status   Specimen Description URINE, RANDOM  Final   Special Requests NONE  Final   Culture MULTIPLE SPECIES PRESENT, SUGGEST RECOLLECTION (A)  Final   Report Status 04/03/2016 FINAL  Final  C difficile quick scan w PCR reflex     Status: None   Collection Time: 04/04/16  7:41 AM  Result Value Ref Range Status   C Diff antigen NEGATIVE NEGATIVE Final   C Diff toxin NEGATIVE NEGATIVE Final   C Diff interpretation No C. difficile detected.  Final    Coagulation Studies: No results for input(s): LABPROT,  INR in the last 72 hours.  Urinalysis:  Recent Labs  04/01/16 1857  COLORURINE YELLOW  LABSPEC 1.013  PHURINE 5.0  GLUCOSEU NEGATIVE  HGBUR NEGATIVE  BILIRUBINUR NEGATIVE  KETONESUR NEGATIVE  PROTEINUR 30*  NITRITE NEGATIVE  LEUKOCYTESUR NEGATIVE      Imaging: Ct Chest Wo Contrast  Result Date: 04/02/2016 CLINICAL DATA:  Dyspnea.  Fever, chills, weakness. EXAM: CT CHEST WITHOUT CONTRAST TECHNIQUE: Multidetector CT imaging of the chest  was performed following the standard protocol without IV contrast. COMPARISON:  Chest radiograph yesterday. FINDINGS: Cardiovascular: Coronary artery calcifications versus stent. Borderline mild multi chamber cardiomegaly. Mild atherosclerosis of the thoracic aorta without aneurysm or periaortic soft tissue stranding Mediastinum/Nodes: Calcified right hilar lymph nodes, sequela of prior granulomatous disease. Small noncalcified mediastinal nodes are not enlarged by size criteria. Similar small axillary nodes, not enlarged. No definite pathologic hilar adenopathy. No pericardial fluid. Heterogeneous enlargement of the left thyroid gland, previously characterized on thyroid ultrasound September 2016. Lungs/Pleura: Mild apical predominant emphysema. No focal airspace opacity or pulmonary edema. No pulmonary mass or suspicious nodule. No pleural fluid. Scattered fissural thickening. Trachea and mainstem bronchi are patent. Upper Abdomen: No acute abnormality. Musculoskeletal: Well-defined lucency within the right scapula is peripheral sclerotic border and no suspicious features. Small subcentimeter lucency in mid thoracic vertebra, unchanged from CT 09/30/2011 and considered benign. No suspicious destructive or sclerotic osseous lesions. IMPRESSION: 1. No acute abnormality. 2. Minimal emphysema and bronchial thickening. 3. Thoracic aortic atherosclerosis. Coronary artery calcifications/stents. Electronically Signed   By: Jeb Levering M.D.   On: 04/02/2016 18:59      Medications:     . aspirin EC  81 mg Oral Daily  . brimonidine  1 drop Both Eyes BID  . carvedilol  25 mg Oral BID WC  . cholecalciferol  2,000 Units Oral Daily  . cloNIDine  0.1 mg Oral BID  . ferrous sulfate  325 mg Oral Q breakfast  . finasteride  5 mg Oral Daily  . furosemide  40 mg Intravenous Daily  . heparin  5,000 Units Subcutaneous Q8H  . hydrALAZINE  50 mg Oral TID  . insulin aspart  0-9 Units Subcutaneous TID WC  . insulin detemir  5 Units Subcutaneous QHS  . isosorbide mononitrate  120 mg Oral Daily  . latanoprost  1 drop Both Eyes QHS  . minoxidil  2.5 mg Oral Daily  . multivitamin with minerals  1 tablet Oral Daily  . piperacillin-tazobactam (ZOSYN)  IV  3.375 g Intravenous Q8H  . prasugrel  10 mg Oral QHS  . sodium chloride flush  3 mL Intravenous Q12H  . tamsulosin  0.4 mg Oral QPC supper  . vitamin C  1,000 mg Oral Daily   guaiFENesin, ondansetron (ZOFRAN) IV  Assessment/ Plan:  Acute on chronic kidney disease:  Suspect a combination of septic AKI and nephrotoxicity from vanc/ Zosyn. Vancomycin discontinued .  Underlying kidney disease thought to be due to DM and HTN without obstructive component despite large prostate on renal US .  I expect renal function to recover, but this may take several days.  Metabolic acidosis will add sodium bicarbonate  Phos 3.6  Stable no additional labs pending  Anemia   Tsats pending  Volume continue IV diuresis for now       LOS: 2 Coralyn Roselli W @TODAY @11 :32 AM

## 2016-04-04 NOTE — Consult Note (Signed)
CARDIOLOGY CONSULT NOTE  Patient ID: Ray Merritt MRN: [id number] DOB/AGE: 1935/09/27 80 y.o.  Admit date: 04/01/2016 Referring Physician  Mart Piggs, MD Primary Physician:  Antony Blackbird, MD Reason for Consultation  CHF  HPI: Ray Merritt  is a 80 y.o. male  With Patient with history of hypertension, hyperlipidemia, uncontrolled diabetes mellitus with stage 3-4 chronic kidney disease and known coronary artery disease with history of angioplasty to his mid LAD on 05/22/2015 with a 3.5 mm resolute DES. He also has asymptomatic right coronary artery stenosis of 50-60%.  He has chronic anemia, hyperlipidemia, and his baseline creatinine is around 2.5. He is now admitted to the hospital with acute onset of shortness of breath, cough, fever and chills, sepsis.  Patient has had difficulty in controlling his hypertension. About 2 weeks ago I started him on minoxidil cream which he is tolerating. He has chronic leg edema, at times worse than the others. I been asked to see the patient to manage his acute on chronic diastolic heart failure.  Patient was confused on admission to the hospital, this morning he is alert and oriented and states that he has started to feel well. He still complains of dry hacking cough, feels his chest is congested. Denies any loose stools, nausea or vomiting. Denies any chest pain. No hemoptysis. States that his leg edema is stable. No pain in his lower extremities.  Past Medical History:  Diagnosis Date  . Anemia   . Arthritis   . Cancer ([place])   . Chronic kidney disease   . Coronary artery disease   . Diabetes mellitus    insulin dependent   . Enlarged prostate   . Hypertension      Past Surgical History:  Procedure Laterality Date  . CARDIAC CATHETERIZATION N/A 05/01/2015   Procedure: Left Heart Cath and Coronary Angiography;  Surgeon: Adrian Prows, MD;  Location: [facility];  Service: Cardiovascular;  Laterality: N/A;  . CARDIAC CATHETERIZATION   05/01/2015   Procedure: Intravascular Pressure Wire/FFR Study;  Surgeon: Adrian Prows, MD;  Location: [facility];  Service: Cardiovascular;;  . CARDIAC CATHETERIZATION N/A 05/22/2015   Procedure: Left Heart Cath and Coronary Angiography;  Surgeon: Adrian Prows, MD;  Location: [facility];  Service: Cardiovascular;  Laterality: N/A;  . CARDIAC CATHETERIZATION N/A 05/22/2015   Procedure: Coronary Stent Intervention;  Surgeon: Adrian Prows, MD;  Location: [facility];  Service: Cardiovascular;  Laterality: N/A;  . SHOULDER SURGERY       Family History  Problem Relation Age of Onset  . Hypertension Mother   . Heart disease Sister   . Stroke Sister   . Diabetes Brother      Social History: Social History   Social History  . Marital status: Married    Spouse name: N/A  . Number of children: N/A  . Years of education: N/A   Occupational History  . Not on file.   Social History Main Topics  . Smoking status: Former Research scientist (life sciences)  . Smokeless tobacco: Never Used     Comment: quit smoking in early 70's  . Alcohol use No  . Drug use: No  . Sexual activity: Not on file   Other Topics Concern  . Not on file   Social History Narrative  . No narrative on file     Prescriptions Prior to Admission  Medication Sig Dispense Refill Last Dose  . ALPHAGAN P 0.1 % SOLN Place 1 drop into both eyes  2 (two) times daily.  0 04/01/2016 at Unknown time  . Ascorbic Acid (VITAMIN C) 1000 MG tablet Take 1,000 mg by mouth daily.   04/01/2016 at Unknown time  . aspirin 81 MG tablet Take 81 mg by mouth daily.   03/31/2016 at Unknown time  . atorvastatin (LIPITOR) 10 MG tablet Take 20 mg by mouth daily.    03/31/2016 at Unknown time  . carvedilol (COREG) 25 MG tablet Take 25 mg by mouth 2 (two) times daily with a meal.   0 04/01/2016 at 0800  . Cholecalciferol (VITAMIN D) 2000 units tablet Take 2,000 Units by mouth daily.   04/01/2016 at Unknown time  . cloNIDine (CATAPRES) 0.1 MG tablet Take 0.1 mg by  mouth 2 (two) times daily.  0 04/01/2016 at 0800  . diphenhydramine-acetaminophen (TYLENOL PM) 25-500 MG TABS tablet Take 2 tablets by mouth at bedtime.   03/31/2016 at Unknown time  . Ferrous Sulfate (IRON) 325 (65 Fe) MG TABS Take 1 tablet by mouth daily.   04/01/2016 at Unknown time  . finasteride (PROSCAR) 5 MG tablet Take 5 mg by mouth daily.   03/31/2016 at Unknown time  . furosemide (LASIX) 40 MG tablet Take 40 mg by mouth daily.   04/01/2016 at Unknown time  . hydrALAZINE (APRESOLINE) 50 MG tablet Take 50 mg by mouth 3 (three) times daily.  0 04/01/2016 at Unknown time  . insulin NPH Human (HUMULIN N,NOVOLIN N) 100 UNIT/ML injection Inject 0.15 mLs (15 Units total) into the skin 2 (two) times daily before a meal. 10 mL 0 04/01/2016 at Unknown time  . isosorbide mononitrate (IMDUR) 120 MG 24 hr tablet Take 120 mg by mouth daily.   04/01/2016 at Unknown time  . minoxidil (LONITEN) 2.5 MG tablet Take 2.5 mg by mouth daily.   04/01/2016 at 0800  . Multiple Vitamin (MULTIVITAMIN WITH MINERALS) TABS tablet Take 1 tablet by mouth daily.   04/01/2016 at Unknown time  . NOVOLIN R 100 UNIT/ML injection Inject 0.04 mLs (4 Units total) into the skin 2 (two) times daily before a meal. (Patient taking differently: Inject 6-7 Units into the skin 2 (two) times daily before a meal. Take 7 units in the am and Take 6 units at bedtime) 10 mL 0 04/01/2016 at Unknown time  . Omega-3 Fatty Acids (FISH OIL) 1200 MG CAPS Take 1 capsule by mouth daily.   04/01/2016 at Unknown time  . prasugrel (EFFIENT) 10 MG TABS tablet Take 1 tablet (10 mg total) by mouth daily. (Patient taking differently: Take 10 mg by mouth at bedtime. ) 30 tablet 0 03/31/2016 at 2030  . tamsulosin (FLOMAX) 0.4 MG CAPS capsule Take 0.4 mg by mouth daily after supper.   03/31/2016 at Unknown time  . Travoprost, BAK Free, (TRAVATAN) 0.004 % SOLN ophthalmic solution Place 1 drop into both eyes at bedtime.   03/31/2016 at Unknown time  . triamcinolone cream  (KENALOG) 0.1 % Apply 1 application topically daily.   0 04/01/2016 at Unknown time  . glucose blood (BAYER CONTOUR NEXT TEST) test strip Use as instructed to check blood sugar 2 times per day dx code E11.65 (Patient taking differently: 1 each by Other route See admin instructions. Check blood sugar 3 times daily) 100 each 12 Taking     ROS: General: no fevers/chills/night sweats Eyes: no blurry vision, diplopia, or amaurosis ENT: no sore throat or hearing loss Resp: no cough, wheezing, or hemoptysis CV: no edema or palpitations GI: no abdominal pain, nausea,  vomiting, diarrhea, or constipation GU: no dysuria, frequency, or hematuria Skin: no rash Neuro: no headache, numbness, tingling, or weakness of extremities Musculoskeletal: no joint pain or swelling Heme: no bleeding, DVT, or easy bruising Endo: no polydipsia or polyuria    Physical Exam: Blood pressure (!) 152/59, pulse 63, temperature 98.7 F (37.1 C), temperature source Oral, resp. rate 18, height 6' 3.5" (1.918 m), weight 114.8 kg (253 lb 1.4 oz), SpO2 99 %. Body mass index is 31.22 kg/m.   Patient is alert oriented 3. No acute distress. Mildly obese. Chest examination is clear, cardiac examination reveals normal S1 and S2, no murmurs appreciated. There is no JVD. Abdomen is obese, small pannus present, nontender no organomegaly. Vascular examination reveals 2+ bilateral pitting edema, nontender. Normal femoral and popliteal pulse on the left, popliteal pulses decreased on the right, pedal pulses are very faint.  Labs:   Lab Results  Component Value Date   WBC 13.3 (H) 04/04/2016   HGB 9.2 (L) 04/04/2016   HCT 27.2 (L) 04/04/2016   MCV 85.8 04/04/2016   PLT 180 04/04/2016    Recent Labs Lab 04/02/16 0542  04/04/16 0522  NA 137  < > 135  K 4.6  < > 4.5  CL 110  < > 107  CO2 18*  < > 18*  BUN 78*  < > 83*  CREATININE 3.22*  < > 3.37*  CALCIUM 8.6*  < > 8.9  PROT 5.9*  --   --   BILITOT 0.4  --   --   ALKPHOS  40  --   --   ALT 21  --   --   AST 15  --   --   GLUCOSE 185*  < > 191*  < > = values in this interval not displayed.  Lipid Panel     Component Value Date/Time   CHOL 137 06/05/2015 0914   TRIG 91.0 06/05/2015 0914   HDL 39.60 06/05/2015 0914   CHOLHDL 3 06/05/2015 0914   VLDL 18.2 06/05/2015 0914   LDLCALC 79 06/05/2015 0914    Recent Labs  04/01/16 2221  BNP 135.2*    HEMOGLOBIN A1C Lab Results  Component Value Date   HGBA1C 7.3 (H) 01/21/2016   MPG 171 12/10/2015     Recent Labs  05/22/15 1834  05/23/15 1510 05/24/15 0650 04/01/16 2221  CKTOTAL 417*  --   --   --   --   CKMB 14.3*  --   --   --   --   TROPONINI 0.43*  < > 19.07* 25.48* 0.03*  RELINDX 3.4*  --   --   --   --   < > = values in this interval not displayed.  Lab Results  Component Value Date   CKTOTAL 417 (H) 05/22/2015   CKMB 14.3 (H) 05/22/2015   TROPONINI 0.03 (HH) 04/01/2016     Recent Labs  12/10/15 2140  TSH 0.441    EKG 04/01/2016: Normal sinus rhythm at a rate of 78 bpm, normal axis. No evidence of ischemia. Normal EKG.  Lower exclude a venous duplex 04/07/2016: No evidence of DVT.  Echocardiogram 12/11/2015: Normal LV systolic function, EF 26-94%, mid anteroseptal akinesis, mild MR. Moderate left atrial enlargement.  Chest x-ray 04/02/2016: No acute abnormality, minimal emphysema and bronchial thickening. Thoracic aortic atherosclerosis, coronary calcification and stents. Radiology: Ct Chest Wo Contrast  Result Date: 04/02/2016 CLINICAL DATA:  Dyspnea.  Fever, chills, weakness. EXAM: CT CHEST WITHOUT CONTRAST TECHNIQUE: Multidetector  CT imaging of the chest was performed following the standard protocol without IV contrast. COMPARISON:  Chest radiograph yesterday. FINDINGS: Cardiovascular: Coronary artery calcifications versus stent. Borderline mild multi chamber cardiomegaly. Mild atherosclerosis of the thoracic aorta without aneurysm or periaortic soft tissue stranding  Mediastinum/Nodes: Calcified right hilar lymph nodes, sequela of prior granulomatous disease. Small noncalcified mediastinal nodes are not enlarged by size criteria. Similar small axillary nodes, not enlarged. No definite pathologic hilar adenopathy. No pericardial fluid. Heterogeneous enlargement of the left thyroid gland, previously characterized on thyroid ultrasound September 2016. Lungs/Pleura: Mild apical predominant emphysema. No focal airspace opacity or pulmonary edema. No pulmonary mass or suspicious nodule. No pleural fluid. Scattered fissural thickening. Trachea and mainstem bronchi are patent. Upper Abdomen: No acute abnormality. Musculoskeletal: Well-defined lucency within the right scapula is peripheral sclerotic border and no suspicious features. Small subcentimeter lucency in mid thoracic vertebra, unchanged from CT 09/30/2011 and considered benign. No suspicious destructive or sclerotic osseous lesions. IMPRESSION: 1. No acute abnormality. 2. Minimal emphysema and bronchial thickening. 3. Thoracic aortic atherosclerosis. Coronary artery calcifications/stents. Electronically Signed   By: Jeb Levering M.D.   On: 04/02/2016 18:59    Scheduled Meds: . aspirin EC  81 mg Oral Daily  . brimonidine  1 drop Both Eyes BID  . carvedilol  25 mg Oral BID WC  . cholecalciferol  2,000 Units Oral Daily  . cloNIDine  0.1 mg Oral BID  . ferrous sulfate  325 mg Oral Q breakfast  . finasteride  5 mg Oral Daily  . furosemide  40 mg Intravenous Daily  . heparin  5,000 Units Subcutaneous Q8H  . hydrALAZINE  50 mg Oral TID  . insulin aspart  0-9 Units Subcutaneous TID WC  . insulin detemir  5 Units Subcutaneous QHS  . isosorbide mononitrate  120 mg Oral Daily  . latanoprost  1 drop Both Eyes QHS  . minoxidil  2.5 mg Oral Daily  . multivitamin with minerals  1 tablet Oral Daily  . piperacillin-tazobactam (ZOSYN)  IV  3.375 g Intravenous Q8H  . prasugrel  10 mg Oral QHS  . sodium chloride flush  3  mL Intravenous Q12H  . tamsulosin  0.4 mg Oral QPC supper  . vitamin C  1,000 mg Oral Daily   Continuous Infusions:  PRN Meds:.guaiFENesin, ondansetron (ZOFRAN) IV  ASSESSMENT AND PLAN:  1. Acute on chronic diastolic heart failure, preserved LVEF, precipitated by acute sepsis. 2. Acute on chronic renal failure 3. Diabetes mellitus type 2 uncontrolled with hyperglycemia 4. Difficult to control hypertension 5. Coronary artery disease, stable without angina pectoris, history of angioplasty to his LAD with DES stenting on 05/22/2015.  Recommendation: Blood pressure still remains elevated. Patient has chronic anemia. Consider workup of anemia, may be contributing to his systolic hypertension.  From cardiac standpoint, he is not in any acute florid heart failure. I suspect is minimally elevated BNP is his chronic baseline. There is no clinical evidence or EKG evidence of myocardial ischemia. No active cardiac issues, please call me if questions.  Adrian Prows, MD 04/04/2016, 8:40 AM [facility]. [place] Pager: [phone] Office: [phone] If no answer Cell [phone]

## 2016-04-04 NOTE — Progress Notes (Signed)
Patient ID: Ray Merritt, male   DOB: 01-18-1936, 80 y.o.   MRN: [id number]    PROGRESS NOTE    LEELYND MALDONADO  JOA:[id number] DOB: 1936/01/30 DOA: 04/01/2016  PCP: Antony Blackbird, MD   Brief Narrative:  79 y.o. male with medical history significant of CLL (previously noted to be in remission), HTN, HLD, CAD s/p stents, CKD, BPH, and uncontrolled DM type 2; who presented with main concern of 1-2 days duration of poor oral intake, nausea and few episodes of non bloody vomiting. This was associated with subjective fevers, chills, confusion, unsteady gait. Pt reported similar event in the past in May 2017 when he was admitted for sepsis due to UTI. Pt explained that his dyspnea has been present for longer time and was associated with LE swelling, no specific weight change but he has noted he was unable to lay down flat.   Assessment & Plan:   Principal Problem:   Sepsis of unknown etiology, Possibly UTI  - please note that pt met sepsis criteria on admission with with T 101, WBC 19K, procalcitonin > 6, RR up to 30 - source is not clear and urine culture with multiple sp so recollection was recommended, results pending  - blood cultures 1/2 positive for staph but could be contaminant so we need to wait for other culture  - Ct chest and CXR with no evidence of PNA but bronchitis can not be excluded  - continue zosyn day #4 until source determined  - if blood cultures turn out positive for staph we can add vanc but will hold for now as pt is better and only one out of two cultures positive so again could be contamination   Active Problems:   CLL (chronic lymphocytic leukemia) (HCC) - used to follow with Dr. Julien Nordmann, notified him of pt's admission via EPIC     Type 2 diabetes, HbA1c goal < 7% ([place]) with complications of nephropathy - recent A1C was 7.2 in July 2017 - continue Insulin detemir and SSI for now     Acute kidney injury superimposed on chronic kidney disease ([place]), metabolic acidosis   - review of records indicate that three weeks ago, Cr was 3.2 - suspect mild increase in Cr up to 3.5 has to do with acute illness - stopped fluids 9/13 as pt volume overloaded - placed on lasix 40 mg IV and continue same regimen for now - nephrologist consulted for assistance, appreciate recommendations, pt sees Dr. Florene Glen in an outpatient setting     Right renal cyst and masslike echogenic structure about the inferior bladder  - likely arising from the prostate gland as seen on prior CT - however, per renal US, a discrete bladder mass is difficult to exclude based on sonographic findings alone - Cystoscopy considered reasonable per radiologist, will discuss with urology team     Essential HTN - continue Coreg, clonidine, Hydralazine, Imdur per home medical regimen     Acute on chronic diastolic CHF - last ECHO in May 2017 with stable EF - appreciate Dr. Irven Shelling assistance  - weight down drom 256 --> 253 lbs this AM    Anemia of iron deficiency - continue Iron supplementation  - anemia panel pending     Obesity  - Body mass index is 30.67 kg/m  DVT prophylaxis: Heparin Sq Code Status: Full  Family Communication: Patient at bedside, family at bedside  Disposition Plan: Home once sepsis etiology resolved and pt off IV ABX, IV lasix   Consultants:  Nephrology  Cardiology   Procedures:   None   Antimicrobials:   Vancomycin 9/12 --> 9/13  Zosyn 9/12 -->  Subjective: Pt reports feeling better but still with LE swelling.   Objective: Vitals:   04/03/16 1435 04/03/16 2116 04/04/16 0500 04/04/16 0555  BP: (!) 147/59 (!) 151/51  (!) 152/59  Pulse: (!) 56 64  63  Resp: '18 18  18  ' Temp: 98.7 F (37.1 C) 98.7 F (37.1 C)  98.7 F (37.1 C)  TempSrc: Oral Oral  Oral  SpO2: 99% 100%  99%  Weight:   114.8 kg (253 lb 1.4 oz)   Height:        Intake/Output Summary (Last 24 hours) at 04/04/16 1242 Last data filed at 04/04/16 1103  Gross per 24 hour  Intake              1060 ml  Output             1975 ml  Net             -915 ml   Filed Weights   04/01/16 2208 04/03/16 0527 04/04/16 0500  Weight: 112.8 kg (248 lb 10.9 oz) 116.5 kg (256 lb 13.4 oz) 114.8 kg (253 lb 1.4 oz)    Examination:  General exam: Appears calm and comfortable  Respiratory system: Crackles at bases with diminished breath sounds  Cardiovascular system: S1 & S2 heard, RRR. No JVD, murmurs, rubs, gallops or clicks. LE edema +2 Gastrointestinal system: Abdomen is nondistended, soft and nontender. No organomegaly or masses felt.  Central nervous system: Alert and oriented. No focal neurological deficits.  Data Reviewed: I have personally reviewed following labs and imaging studies  CBC:  Recent Labs Lab 04/01/16 1827 04/01/16 1840 04/02/16 0542 04/03/16 0457 04/04/16 0522  WBC 19.5*  --  21.3* 16.3* 13.3*  NEUTROABS 10.9*  --   --   --   --   HGB 10.7* 10.9* 9.3* 9.4* 9.2*  HCT 31.8* 32.0* 27.9* 28.4* 27.2*  MCV 87.6  --  89.1 89.0 85.8  PLT 211  --  195 175 761   Basic Metabolic Panel:  Recent Labs Lab 04/01/16 1827 04/01/16 1840 04/02/16 0542 04/03/16 0457 04/03/16 0505 04/04/16 0522  NA 141 140 137 135  --  135  K 4.7 4.7 4.6 4.6  --  4.5  CL 112* 111 110 109  --  107  CO2 20*  --  18* 19*  --  18*  GLUCOSE 74 71 185* 194*  --  191*  BUN 81* 71* 78* 84*  --  83*  CREATININE 3.23* 3.50* 3.22* 3.41*  --  3.37*  CALCIUM 9.6  --  8.6* 8.7*  --  8.9  PHOS  --   --   --   --  3.6  --    Liver Function Tests:  Recent Labs Lab 04/01/16 1827 04/02/16 0542  AST 18 15  ALT 24 21  ALKPHOS 55 40  BILITOT 0.8 0.4  PROT 7.2 5.9*  ALBUMIN 4.4 3.5   Cardiac Enzymes:  Recent Labs Lab 04/01/16 2221  TROPONINI 0.03*   CBG:  Recent Labs Lab 04/03/16 0741 04/03/16 1156 04/03/16 1658 04/03/16 2112 04/04/16 0748  GLUCAP 183* 219* 245* 226* 202*   Urine analysis:    Component Value Date/Time   COLORURINE YELLOW 04/01/2016 1857   APPEARANCEUR  CLEAR 04/01/2016 1857   LABSPEC 1.013 04/01/2016 1857   PHURINE 5.0 04/01/2016 1857   GLUCOSEU NEGATIVE 04/01/2016  [place] NEGATIVE 04/01/2016 1857   BILIRUBINUR NEGATIVE 04/01/2016 1857   KETONESUR NEGATIVE 04/01/2016 1857   PROTEINUR 30 (A) 04/01/2016 1857   UROBILINOGEN 0.2 10/07/2011 1203   NITRITE NEGATIVE 04/01/2016 1857   LEUKOCYTESUR NEGATIVE 04/01/2016 1857   Radiology Studies: Dg Chest 2 View Result Date: 04/01/2016 CLINICAL DATA:  No acute pulmonary infiltrate or focal consolidation identified 2. Borderline enlargement of heart size with suggestion of mild central vascular congestion.   US Renal Result Date: 04/02/2016 CLINICAL DATA: No hydronephrosis or obstructive uropathy.  Right renal cyst. 2. Masslike echogenic structure about the inferior bladder is likely arising from the prostate gland as seen on prior CT, however a discrete bladder mass is difficult to exclude based on sonographic findings alone. Cystoscopy may be helpful.   Scheduled Meds: . aspirin EC  81 mg Oral Daily  . brimonidine  1 drop Both Eyes BID  . carvedilol  25 mg Oral BID WC  . cholecalciferol  2,000 Units Oral Daily  . cloNIDine  0.1 mg Oral BID  . ferrous sulfate  325 mg Oral Q breakfast  . finasteride  5 mg Oral Daily  . furosemide  40 mg Intravenous Daily  . heparin  5,000 Units Subcutaneous Q8H  . hydrALAZINE  50 mg Oral TID  . insulin aspart  0-9 Units Subcutaneous TID WC  . insulin detemir  5 Units Subcutaneous QHS  . isosorbide mononitrate  120 mg Oral Daily  . latanoprost  1 drop Both Eyes QHS  . minoxidil  2.5 mg Oral Daily  . multivitamin with minerals  1 tablet Oral Daily  . piperacillin-tazobactam (ZOSYN)  IV  3.375 g Intravenous Q8H  . prasugrel  10 mg Oral QHS  . sodium bicarbonate  650 mg Oral TID  . sodium chloride flush  3 mL Intravenous Q12H  . tamsulosin  0.4 mg Oral QPC supper  . vitamin C  1,000 mg Oral Daily   Continuous Infusions:     LOS: 2 days    Time  spent: 20 minutes    Faye Ramsay, MD [facility] Pager [phone]  If 7PM-7AM, please contact night-coverage [email or web address] Password [facility] 04/04/2016, 12:42 PM

## 2016-04-05 DIAGNOSIS — R0600 Dyspnea, unspecified: Secondary | ICD-10-CM

## 2016-04-05 LAB — RENAL FUNCTION PANEL
ANION GAP: 8 (ref 5–15)
Albumin: 3.3 g/dL — ABNORMAL LOW (ref 3.5–5.0)
BUN: 77 mg/dL — AB (ref 6–20)
CHLORIDE: 106 mmol/L (ref 101–111)
CO2: 20 mmol/L — AB (ref 22–32)
Calcium: 8.8 mg/dL — ABNORMAL LOW (ref 8.9–10.3)
Creatinine, Ser: 3.35 mg/dL — ABNORMAL HIGH (ref 0.61–1.24)
GFR calc Af Amer: 19 mL/min — ABNORMAL LOW (ref >60)
GFR calc non Af Amer: 16 mL/min — ABNORMAL LOW (ref >60)
GLUCOSE: 193 mg/dL — AB (ref 65–99)
PHOSPHORUS: 4.4 mg/dL (ref 2.5–4.6)
POTASSIUM: 4.3 mmol/L (ref 3.5–5.1)
Sodium: 134 mmol/L — ABNORMAL LOW (ref 135–145)

## 2016-04-05 LAB — CBC
HEMATOCRIT: 28.1 % — AB (ref 39.0–52.0)
HEMOGLOBIN: 9.4 g/dL — AB (ref 13.0–17.0)
MCH: 29.4 pg (ref 26.0–34.0)
MCHC: 33.5 g/dL (ref 30.0–36.0)
MCV: 87.8 fL (ref 78.0–100.0)
Platelets: 214 10*3/uL (ref 150–400)
RBC: 3.2 MIL/uL — ABNORMAL LOW (ref 4.22–5.81)
RDW: 13.8 % (ref 11.5–15.5)
WBC: 12.8 10*3/uL — ABNORMAL HIGH (ref 4.0–10.5)

## 2016-04-05 LAB — GLUCOSE, CAPILLARY
Glucose-Capillary: 174 mg/dL — ABNORMAL HIGH (ref 65–99)
Glucose-Capillary: 218 mg/dL — ABNORMAL HIGH (ref 65–99)

## 2016-04-05 MED ORDER — POTASSIUM CHLORIDE ER 10 MEQ PO TBCR: 10.0000 meq | EXTENDED_RELEASE_TABLET | Freq: Every day | ORAL | 0 refills | Status: DC

## 2016-04-05 MED ORDER — NA FERRIC GLUC CPLX IN SUCROSE 12.5 MG/ML IV SOLN
125.0000 mg | Freq: Once | INTRAVENOUS | Status: AC
Start: 2016-04-05 — End: 2016-04-05
  Administered 2016-04-05: 125 mg via INTRAVENOUS
  Filled 2016-04-05: qty 10

## 2016-04-05 MED ORDER — ACETAMINOPHEN 325 MG PO TABS
325.0000 mg | ORAL_TABLET | Freq: Once | ORAL | Status: AC
  Administered 2016-04-05: 325 mg via ORAL
  Filled 2016-04-05: qty 1

## 2016-04-05 MED ORDER — DIPHENHYDRAMINE HCL 25 MG PO CAPS
25.0000 mg | ORAL_CAPSULE | Freq: Once | ORAL | Status: AC
  Administered 2016-04-05: 25 mg via ORAL
  Filled 2016-04-05: qty 1

## 2016-04-05 MED ORDER — LEVOFLOXACIN 500 MG PO TABS: 500.0000 mg | ORAL_TABLET | ORAL | 0 refills | Status: DC

## 2016-04-05 MED ORDER — FUROSEMIDE 40 MG PO TABS: 40.0000 mg | ORAL_TABLET | Freq: Every day | ORAL | 0 refills | Status: DC

## 2016-04-05 NOTE — Progress Notes (Signed)
Pt is discharged to home. DC instructions given. Prescriptions also given. No concerns voiced. Left unit in wheelchair pushed by nurse tech accompanied by male family member. Left in good condition.  No concerns voiced. Madelin Headings

## 2016-04-05 NOTE — Consult Note (Signed)
[facility] ROUNDING NOTE   Subjective:   Interval History:no complaints of swelling or dyspnea  Feels well   Objective:  Vital signs in last 24 hours:  Temp:  [97.7 F (36.5 C)-98.2 F (36.8 C)] 98.2 F (36.8 C) (09/16 0438) Pulse Rate:  [57-65] 63 (09/16 0438) Resp:  [16] 16 (09/16 0438) BP: (151-165)/(48-76) 165/58 (09/16 0927) SpO2:  [97 %-99 %] 97 % (09/16 0438) Weight:  [114.2 kg (251 lb 12.3 oz)] 114.2 kg (251 lb 12.3 oz) (09/16 0438)  Weight change: -0.6 kg (-1 lb 5.2 oz) Filed Weights   04/03/16 0527 04/04/16 0500 04/05/16 0438  Weight: 116.5 kg (256 lb 13.4 oz) 114.8 kg (253 lb 1.4 oz) 114.2 kg (251 lb 12.3 oz)    Intake/Output: I/O last 3 completed shifts: In: 1130 [P.O.:1080; IV Piggyback:50] Out: 2875 [Urine:2875]   Intake/Output this shift:  Total I/O In: 240 [P.O.:240] Out: -   CVS- RRR RS- CTA ABD- BS present soft non-distended EXT- no edema   Basic Metabolic Panel:  Recent Labs Lab 04/01/16 1827 04/01/16 1840 04/02/16 0542 04/03/16 0457 04/03/16 0505 04/04/16 0522 04/05/16 0533  NA 141 140 137 135  --  135 134*  K 4.7 4.7 4.6 4.6  --  4.5 4.3  CL 112* 111 110 109  --  107 106  CO2 20*  --  18* 19*  --  18* 20*  GLUCOSE 74 71 185* 194*  --  191* 193*  BUN 81* 71* 78* 84*  --  83* 77*  CREATININE 3.23* 3.50* 3.22* 3.41*  --  3.37* 3.35*  CALCIUM 9.6  --  8.6* 8.7*  --  8.9 8.8*  PHOS  --   --   --   --  3.6  --  4.4    Liver Function Tests:  Recent Labs Lab 04/01/16 1827 04/02/16 0542 04/05/16 0533  AST 18 15  --   ALT 24 21  --   ALKPHOS 55 40  --   BILITOT 0.8 0.4  --   PROT 7.2 5.9*  --   ALBUMIN 4.4 3.5 3.3*   No results for input(s): LIPASE, AMYLASE in the last 168 hours. No results for input(s): AMMONIA in the last 168 hours.  CBC:  Recent Labs Lab 04/01/16 1827 04/01/16 1840 04/02/16 0542 04/03/16 0457 04/04/16 0522 04/05/16 0533  WBC 19.5*  --  21.3* 16.3* 13.3* 12.8*  NEUTROABS 10.9*  --    --   --   --   --   HGB 10.7* 10.9* 9.3* 9.4* 9.2* 9.4*  HCT 31.8* 32.0* 27.9* 28.4* 27.2* 28.1*  MCV 87.6  --  89.1 89.0 85.8 87.8  PLT 211  --  195 175 180 214    Cardiac Enzymes:  Recent Labs Lab 04/01/16 2221  TROPONINI 0.03*    BNP: Invalid input(s): POCBNP  CBG:  Recent Labs Lab 04/04/16 0748 04/04/16 1225 04/04/16 1709 04/04/16 2243 04/05/16 0727  GLUCAP 202* 222* 257* 201* 174*    Microbiology: Results for orders placed or performed during the hospital encounter of 04/01/16  Blood Culture (routine x 2)     Status: None (Preliminary result)   Collection Time: 04/01/16  6:25 PM  Result Value Ref Range Status   Specimen Description BLOOD RIGHT ANTECUBITAL  Final   Special Requests BOTTLES DRAWN AEROBIC AND ANAEROBIC 4 CC EACH  Final   Culture   Final    NO GROWTH 3 DAYS Performed at [facility]  Report Status PENDING  Incomplete  Blood Culture (routine x 2)     Status: Abnormal   Collection Time: 04/01/16  6:27 PM  Result Value Ref Range Status   Specimen Description BLOOD BLOOD LEFT FOREARM  Final   Special Requests IN PEDIATRIC BOTTLE 1 CC  Final   Culture  Setup Time   Final    GRAM POSITIVE COCCI IN CLUSTERS IN PEDIATRIC BOTTLE CRITICAL RESULT CALLED TO, READ BACK BY AND VERIFIED WITH: J. Gibson, [place] [facility] [place] ON [id number] BY M. KELLY    Culture (A)  Final    STAPHYLOCOCCUS SPECIES (COAGULASE NEGATIVE) THE SIGNIFICANCE OF ISOLATING THIS ORGANISM FROM A SINGLE SET OF BLOOD CULTURES WHEN MULTIPLE SETS ARE DRAWN IS UNCERTAIN. PLEASE NOTIFY THE MICROBIOLOGY DEPARTMENT WITHIN ONE WEEK IF SPECIATION AND SENSITIVITIES ARE REQUIRED. Performed at [facility]    Report Status 04/04/2016 FINAL  Final  Blood Culture ID Panel (Reflexed)     Status: Abnormal   Collection Time: 04/01/16  6:27 PM  Result Value Ref Range Status   Enterococcus species NOT DETECTED NOT DETECTED Final   Listeria monocytogenes NOT DETECTED NOT DETECTED Final    Staphylococcus species DETECTED (A) NOT DETECTED Final    Comment: CRITICAL RESULT CALLED TO, READ BACK BY AND VERIFIED WITH: JULIAN GRIMSLEY,PHARMD @0535  04/03/16 MKELLY    Staphylococcus aureus NOT DETECTED NOT DETECTED Final   Methicillin resistance NOT DETECTED NOT DETECTED Final   Streptococcus species NOT DETECTED NOT DETECTED Final   Streptococcus agalactiae NOT DETECTED NOT DETECTED Final   Streptococcus pneumoniae NOT DETECTED NOT DETECTED Final   Streptococcus pyogenes NOT DETECTED NOT DETECTED Final   Acinetobacter baumannii NOT DETECTED NOT DETECTED Final   Enterobacteriaceae species NOT DETECTED NOT DETECTED Final   Enterobacter cloacae complex NOT DETECTED NOT DETECTED Final   Escherichia coli NOT DETECTED NOT DETECTED Final   Klebsiella oxytoca NOT DETECTED NOT DETECTED Final   Klebsiella pneumoniae NOT DETECTED NOT DETECTED Final   Proteus species NOT DETECTED NOT DETECTED Final   Serratia marcescens NOT DETECTED NOT DETECTED Final   Haemophilus influenzae NOT DETECTED NOT DETECTED Final   Neisseria meningitidis NOT DETECTED NOT DETECTED Final   Pseudomonas aeruginosa NOT DETECTED NOT DETECTED Final   Candida albicans NOT DETECTED NOT DETECTED Final   Candida glabrata NOT DETECTED NOT DETECTED Final   Candida krusei NOT DETECTED NOT DETECTED Final   Candida parapsilosis NOT DETECTED NOT DETECTED Final   Candida tropicalis NOT DETECTED NOT DETECTED Final    Comment: Performed at [facility]  Urine culture     Status: Abnormal   Collection Time: 04/01/16  6:58 PM  Result Value Ref Range Status   Specimen Description URINE, RANDOM  Final   Special Requests NONE  Final   Culture MULTIPLE SPECIES PRESENT, SUGGEST RECOLLECTION (A)  Final   Report Status 04/03/2016 FINAL  Final  Culture, Urine     Status: None   Collection Time: 04/03/16 12:01 PM  Result Value Ref Range Status   Specimen Description URINE, CLEAN CATCH  Final   Special Requests NONE  Final    Culture NO GROWTH Performed at [facility]   Final   Report Status 04/04/2016 FINAL  Final  C difficile quick scan w PCR reflex     Status: None   Collection Time: 04/04/16  7:41 AM  Result Value Ref Range Status   C Diff antigen NEGATIVE NEGATIVE Final   C Diff toxin NEGATIVE NEGATIVE Final   C  Diff interpretation No C. difficile detected.  Final    Coagulation Studies: No results for input(s): LABPROT, INR in the last 72 hours.  Urinalysis: No results for input(s): COLORURINE, LABSPEC, PHURINE, GLUCOSEU, HGBUR, BILIRUBINUR, KETONESUR, PROTEINUR, UROBILINOGEN, NITRITE, LEUKOCYTESUR in the last 72 hours.  Invalid input(s): APPERANCEUR    Imaging: No results found.   Medications:     . aspirin EC  81 mg Oral Daily  . brimonidine  1 drop Both Eyes BID  . carvedilol  25 mg Oral BID WC  . cholecalciferol  2,000 Units Oral Daily  . cloNIDine  0.1 mg Oral BID  . ferrous sulfate  325 mg Oral Q breakfast  . finasteride  5 mg Oral Daily  . heparin  5,000 Units Subcutaneous Q8H  . hydrALAZINE  50 mg Oral TID  . insulin aspart  0-9 Units Subcutaneous TID WC  . insulin detemir  5 Units Subcutaneous QHS  . isosorbide mononitrate  120 mg Oral Daily  . latanoprost  1 drop Both Eyes QHS  . loratadine  10 mg Oral Daily  . minoxidil  2.5 mg Oral Daily  . multivitamin with minerals  1 tablet Oral Daily  . piperacillin-tazobactam (ZOSYN)  IV  3.375 g Intravenous Q8H  . prasugrel  10 mg Oral QHS  . sodium bicarbonate  650 mg Oral TID  . sodium chloride flush  3 mL Intravenous Q12H  . tamsulosin  0.4 mg Oral QPC supper  . vitamin C  1,000 mg Oral Daily   guaiFENesin, ondansetron (ZOFRAN) IV  Assessment/ Plan:    Sepsis of unknown etiology, Possibly UTI  - continue zosyn day #4 until source determined    Active Problems:   CLL (chronic lymphocytic leukemia) ([place]) - used to follow with Dr. 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 - recent A1C was 7.2 in July 2017 - continue Insulin detemir and SSI for now     Acute kidney injury superimposed on chronic kidney disease ([place]), metabolic acidosis  - Baseline creatinine about 3  Suspect not much change - Follow up Dr Florene Glen - Stop lasix today change back to outpatient dose 40mg  daily    Right renal cyst and masslike echogenic structure about the inferior bladder  - Cystoscopy considered reasonable per radiologist, will discuss with urology team     Essential HTN - continue Coreg, clonidine, Hydralazine, Imdur per home medical regimen     Acute on chronic diastolic CHF - last ECHO in May 2017 with stable EF      Anemia of iron deficiency - continue Iron supplementation  - anemia panel pending  - Tsats low will give IV iron    Obesity  - Body mass index is 30.67 kg/m  Will sign off today  Call [phone] with concerns   LOS: 3 Danyel Tobey W @TODAY @11 :14 AM

## 2016-04-05 NOTE — Discharge Instructions (Signed)
Levofloxacin tablets °What is this medicine? °LEVOFLOXACIN (lee voe FLOX a sin) is a quinolone antibiotic. It is used to treat certain kinds of bacterial infections. It will not work for colds, flu, or other viral infections. °This medicine may be used for other purposes; ask your health care provider or pharmacist if you have questions. °What should I tell my health care provider before I take this medicine? °They need to know if you have any of these conditions: °-bone problems °-cerebral disease °-history of low levels of potassium in the blood °-irregular heartbeat °-joint problems °-kidney disease °-myasthenia gravis °-seizures °-tendon problems °-tingling of the fingers or toes, or other nerve disorder °-an unusual or allergic reaction to levofloxacin, other quinolone antibiotics, foods, dyes, or preservatives °-pregnant or trying to get pregnant °-breast-feeding °How should I use this medicine? °Take this medicine by mouth with a full glass of water. Follow the directions on the prescription label. This medicine can be taken with or without food. Take your medicine at regular intervals. Do not take your medicine more often than directed. Do not skip doses or stop your medicine early even if you feel better. Do not stop taking except on your doctor's advice. °A special MedGuide will be given to you by the pharmacist with each prescription and refill. Be sure to read this information carefully each time. °Talk to your pediatrician regarding the use of this medicine in children. While this drug may be prescribed for children as young as 6 months for selected conditions, precautions do apply. °Overdosage: If you think you have taken too much of this medicine contact a poison control center or emergency room at once. °NOTE: This medicine is only for you. Do not share this medicine with others. °What if I miss a dose? °If you miss a dose, take it as soon as you remember. If it is almost time for your next dose,  take only that dose. Do not take double or extra doses. °What may interact with this medicine? °Do not take this medicine with any of the following medications: °-arsenic trioxide °-chloroquine °-droperidol °-medicines for irregular heart rhythm like amiodarone, disopyramide, dofetilide, flecainide, quinidine, procainamide, sotalol °-some medicines for depression or mental problems like phenothiazines, pimozide, and ziprasidone °This medicine may also interact with the following medications: °-amoxapine °-antacids °-birth control pills °-cisapride °-dairy products °-didanosine (ddI) buffered tablets or powder °-haloperidol °-multivitamins °-NSAIDS, medicines for pain and inflammation, like ibuprofen or naproxen °-retinoid products like tretinoin or isotretinoin °-risperidone °-some other antibiotics like clarithromycin or erythromycin °-sucralfate °-theophylline °-warfarin °This list may not describe all possible interactions. Give your health care provider a list of all the medicines, herbs, non-prescription drugs, or dietary supplements you use. Also tell them if you smoke, drink alcohol, or use illegal drugs. Some items may interact with your medicine. °What should I watch for while using this medicine? °Tell your doctor or health care professional if your symptoms do not improve or if they get worse. Drink several glasses of water a day and cut down on drinks that contain caffeine. You must not get dehydrated while taking this medicine. °You may get drowsy or dizzy. Do not drive, use machinery, or do anything that needs mental alertness until you know how this medicine affects you. Do not sit or stand up quickly, especially if you are an older patient. This reduces the risk of dizzy or fainting spells. °This medicine can make you more sensitive to the sun. Keep out of the sun. If you cannot avoid   being in the sun, wear protective clothing and use a sunscreen. Do not use sun lamps or tanning beds/booths. Contact  your doctor if you get a sunburn. °If you are a diabetic monitor your blood glucose carefully. If you get an unusual reading stop taking this medicine and call your doctor right away. °Do not treat diarrhea with over-the-counter products. Contact your doctor if you have diarrhea that lasts more than 2 days or if the diarrhea is severe and watery. °Avoid antacids, calcium, iron, and zinc products for 2 hours before and 2 hours after taking a dose of this medicine. °What side effects may I notice from receiving this medicine? °Side effects that you should report to your doctor or health care professional as soon as possible: °-allergic reactions like skin rash or hives, swelling of the face, lips, or tongue °-anxious °-confusion °-depressed mood °-diarrhea °-fast, irregular heartbeat °-hallucination, loss of contact with reality °-joint, muscle, or tendon pain or swelling °-pain, tingling, numbness in the hands or feet °-suicidal thoughts or other mood changes °-sunburn °-unusually weak or tired °Side effects that usually do not require medical attention (report to your doctor or health care professional if they continue or are bothersome): °-dry mouth °-headache °-nausea °-trouble sleeping °This list may not describe all possible side effects. Call your doctor for medical advice about side effects. You may report side effects to FDA at 1-800-FDA-1088. °Where should I keep my medicine? °Keep out of the reach of children. °Store at room temperature between 15 and 30 degrees C (59 and 86 degrees F). Keep in a tightly closed container. Throw away any unused medicine after the expiration date. °NOTE: This sheet is a summary. It may not cover all possible information. If you have questions about this medicine, talk to your doctor, pharmacist, or health care provider. °  °© 2016, [employer]. (2015-02-15 12:40:18) ° °

## 2016-04-05 NOTE — Discharge Summary (Signed)
Physician Discharge Summary  Ray Merritt KYH:[id number] DOB: 06-04-1936 DOA: 04/01/2016  PCP: Antony Blackbird, MD  Admit date: 04/01/2016 Discharge date: 04/05/2016  Recommendations for Outpatient Follow-up:  1. Pt will need to follow up with PCP in 1-2 weeks post discharge 2. Please obtain BMP to evaluate electrolytes and kidney function 3. Please also check CBC to evaluate Hg and Hct levels 4. per renal US, a discrete bladder mass is difficult to exclude based on sonographic findings alone 5. Cystoscopy considered reasonable per radiologist, please provide referral to urologist for further evaluation    Discharge Diagnoses:  Principal Problem:   SIRS (systemic inflammatory response syndrome) ([place]) Active Problems:   Leukocytosis   CLL (chronic lymphocytic leukemia) (HCC)   Type II diabetes mellitus, uncontrolled (HCC)   FUO (fever of unknown origin)   Type 2 diabetes, HbA1c goal < 7% (HCC)   Acute kidney injury superimposed on chronic kidney disease ([place])  Discharge Condition: Stable  Diet recommendation: Heart healthy diet discussed in details   Brief Narrative:  80 y.o.malewith medical history significant of CLL (previously noted to be in remission), HTN, HLD, CAD s/p stents, CKD, BPH, and uncontrolled DM type 2; who presented with main concern of 1-2 days duration of poor oral intake, nausea and few episodes of non bloody vomiting. This was associated with subjective fevers, chills, confusion, unsteady gait. Pt reported similar event in the past in May 2017 when he was admitted for sepsis due to UTI. Pt explained that his dyspnea has been present for longer time and was associated with LE swelling, no specific weight change but he has noted he was unable to lay down flat.   Assessment & Plan:   Principal Problem:   Sepsis of unknown etiology, Possibly UTI  - please note that pt met sepsis criteria on admission with with T 101, WBC 19K, procalcitonin > 6, RR up to 30 -  source is not clear and urine culture with multiple sp so recollection was recommended - blood cultures 1/2 positive for staph but could be contaminant so we need to wait for other culture  - Ct chest and CXR with no evidence of PNA but bronchitis can not be excluded  - pt was on zosyn and since UTI suspected to be source, pt advised to complete therapy with Levaquin upon discharge   Active Problems:   CLL (chronic lymphocytic leukemia) (HCC) - used to follow with Dr. Julien Nordmann, notified him of pt's admission via EPIC     Type 2 diabetes, HbA1c goal < 7% ([place]) with complications of nephropathy - recent A1C was 7.2 in July 2017 - continue Insulin per home medical regimen     Acute kidney injury superimposed on chronic kidney disease ([place]), metabolic acidosis  - review of records indicate that three weeks ago, Cr was 3.2 - suspect mild increase in Cr up to 3.5 has to do with acute illness - stopped fluids 9/13 as pt volume overloaded - placed on lasix 40 mg IV and will be discharge on Lasix  - will also provide K-dur as pt on Lasix  - nephrologist consulted for assistance, appreciate recommendations, pt sees Dr. Florene Glen in an outpatient setting     Right renal cyst and masslike echogenic structure about the inferior bladder  - likely arising from the prostate gland as seen on prior CT - however, per renal US, a discrete bladder mass is difficult to exclude based on sonographic findings alone - Cystoscopy considered reasonable per radiologist  Essential HTN - continue Coreg, clonidine, Hydralazine, Imdur per home medical regimen     Acute on chronic diastolic CHF - last ECHO in May 2017 with stable EF - appreciate Dr. Irven Shelling assistance  - weight down drom 256 --> 253 --> 251 lbs    Anemia of iron deficiency - continue Iron supplementation     Obesity  - Body mass index is 30.67 kg/m  DVT prophylaxis: Heparin Sq Code Status: Full  Family Communication: Patient at  bedside, family at bedside  Disposition Plan: Home   Consultants:   Nephrology  Cardiology   Procedures:   None   Antimicrobials:   Vancomycin 9/12 --> 9/13  Zosyn 9/12 --> transitioned to Levaquin to complete therapy at home   Procedures/Studies: Dg Chest 2 View  Result Date: 04/01/2016 CLINICAL DATA:  80 year old male complains of shortness of breath, cough and emesis EXAM: CHEST  2 VIEW COMPARISON:  12/10/2015, 03/17/2000 FINDINGS: AP and lateral views of the chest. No acute consolidation or pleural effusion identified. Borderline enlargement of the heart size. Suggestion of mild central vascular congestion. No overt edema. Atherosclerosis of the aortic arch. No pneumothorax. IMPRESSION: 1. No acute pulmonary infiltrate or focal consolidation identified 2. Borderline enlargement of heart size with suggestion of mild central vascular congestion. Electronically Signed   By: Donavan Foil M.D.   On: 04/01/2016 19:22   Ct Chest Wo Contrast  Result Date: 04/02/2016 CLINICAL DATA:  Dyspnea.  Fever, chills, weakness. EXAM: CT CHEST WITHOUT CONTRAST TECHNIQUE: Multidetector CT imaging of the chest was performed following the standard protocol without IV contrast. COMPARISON:  Chest radiograph yesterday. FINDINGS: Cardiovascular: Coronary artery calcifications versus stent. Borderline mild multi chamber cardiomegaly. Mild atherosclerosis of the thoracic aorta without aneurysm or periaortic soft tissue stranding Mediastinum/Nodes: Calcified right hilar lymph nodes, sequela of prior granulomatous disease. Small noncalcified mediastinal nodes are not enlarged by size criteria. Similar small axillary nodes, not enlarged. No definite pathologic hilar adenopathy. No pericardial fluid. Heterogeneous enlargement of the left thyroid gland, previously characterized on thyroid ultrasound September 2016. Lungs/Pleura: Mild apical predominant emphysema. No focal airspace opacity or pulmonary edema. No  pulmonary mass or suspicious nodule. No pleural fluid. Scattered fissural thickening. Trachea and mainstem bronchi are patent. Upper Abdomen: No acute abnormality. Musculoskeletal: Well-defined lucency within the right scapula is peripheral sclerotic border and no suspicious features. Small subcentimeter lucency in mid thoracic vertebra, unchanged from CT 09/30/2011 and considered benign. No suspicious destructive or sclerotic osseous lesions. IMPRESSION: 1. No acute abnormality. 2. Minimal emphysema and bronchial thickening. 3. Thoracic aortic atherosclerosis. Coronary artery calcifications/stents. Electronically Signed   By: Jeb Levering M.D.   On: 04/02/2016 18:59   US Renal  Result Date: 04/02/2016 CLINICAL DATA:  Acute kidney injury. EXAM: RENAL / URINARY TRACT ULTRASOUND COMPLETE COMPARISON:  CT 09/30/2011 FINDINGS: Right Kidney: Length: 12.4 cm. There is thinning of the renal parenchyma. Echogenicity within normal limits. Cyst in the mid upper kidney measures 2.9 x 2.3 x 2.0 cm. No mass or hydronephrosis visualized. Left Kidney: Length: 11.6 cm. There is thinning of the renal parenchyma. Echogenicity within normal limits. No mass or hydronephrosis visualized. Bladder: Physiologically distended. Rounded masslike echogenic structure measures 5.0 x 3.9 x 5.6 cm inferiorly, likely prostate as seen on prior CT. IMPRESSION: 1. No hydronephrosis or obstructive uropathy.  Right renal cyst. 2. Masslike echogenic structure about the inferior bladder is likely arising from the prostate gland as seen on prior CT, however a discrete bladder mass is difficult to exclude based  on sonographic findings alone. Cystoscopy may be helpful. Electronically Signed   By: Jeb Levering M.D.   On: 04/02/2016 02:40     Discharge Exam: Vitals:   04/05/16 0927 04/05/16 1300  BP: (!) 165/58 (!) 150/56  Pulse:  63  Resp:  16  Temp:  99.1 F (37.3 C)   Vitals:   04/05/16 0438 04/05/16 0900 04/05/16 0927 04/05/16 1300   BP: (!) 153/55 (!) 165/58 (!) 165/58 (!) 150/56  Pulse: 63   63  Resp: 16   16  Temp: 98.2 F (36.8 C)   99.1 F (37.3 C)  TempSrc: Oral   Oral  SpO2: 97%   98%  Weight: 114.2 kg (251 lb 12.3 oz)     Height:        General: Pt is alert, follows commands appropriately, not in acute distress Cardiovascular: Regular rate and rhythm, S1/S2 +, no murmurs, no rubs, no gallops Respiratory: Clear to auscultation bilaterally, no wheezing, no crackles, no rhonchi Abdominal: Soft, non tender, non distended, bowel sounds +, no guarding Extremities: +1-2 LE pitting edema, no cyanosis, pulses palpable bilaterally DP and PT Neuro: Grossly nonfocal  Discharge Instructions  Discharge Instructions    Call MD for:  difficulty breathing, headache or visual disturbances    Complete by:  As directed    Call MD for:  persistant nausea and vomiting    Complete by:  As directed    Call MD for:  redness, tenderness, or signs of infection (pain, swelling, redness, odor or green/yellow discharge around incision site)    Complete by:  As directed    Call MD for:  severe uncontrolled pain    Complete by:  As directed    Diet - low sodium heart healthy    Complete by:  As directed    Discharge instructions    Complete by:  As directed    Continue lasix 40 mg daily along with low dose potassium supplementation. Continue Levaquin for 3 more doses every other day for bronchitis   Increase activity slowly    Complete by:  As directed        Medication List    TAKE these medications   ALPHAGAN P 0.1 % Soln Generic drug:  brimonidine Place 1 drop into both eyes 2 (two) times daily.   aspirin 81 MG tablet Take 81 mg by mouth daily.   atorvastatin 10 MG tablet Commonly known as:  LIPITOR Take 20 mg by mouth daily.   carvedilol 25 MG tablet Commonly known as:  COREG Take 25 mg by mouth 2 (two) times daily with a meal.   cloNIDine 0.1 MG tablet Commonly known as:  CATAPRES Take 0.1 mg by mouth 2  (two) times daily.   diphenhydramine-acetaminophen 25-500 MG Tabs tablet Commonly known as:  TYLENOL PM Take 2 tablets by mouth at bedtime.   finasteride 5 MG tablet Commonly known as:  PROSCAR Take 5 mg by mouth daily.   Fish Oil 1200 MG Caps Take 1 capsule by mouth daily.   furosemide 40 MG tablet Commonly known as:  LASIX Take 1 tablet (40 mg total) by mouth daily.   glucose blood test strip Commonly known as:  BAYER CONTOUR NEXT TEST Use as instructed to check blood sugar 2 times per day dx code E11.65 What changed:  how much to take  how to take this  when to take this  additional instructions   hydrALAZINE 50 MG tablet Commonly known as:  APRESOLINE Take  50 mg by mouth 3 (three) times daily.   insulin NPH Human 100 UNIT/ML injection Commonly known as:  HUMULIN N,NOVOLIN N Inject 0.15 mLs (15 Units total) into the skin 2 (two) times daily before a meal.   Iron 325 (65 Fe) MG Tabs Take 1 tablet by mouth daily.   isosorbide mononitrate 120 MG 24 hr tablet Commonly known as:  IMDUR Take 120 mg by mouth daily.   levofloxacin 500 MG tablet Commonly known as:  LEVAQUIN Take 1 tablet (500 mg total) by mouth every other day.   minoxidil 2.5 MG tablet Commonly known as:  LONITEN Take 2.5 mg by mouth daily.   multivitamin with minerals Tabs tablet Take 1 tablet by mouth daily.   NOVOLIN R 100 units/mL injection Generic drug:  insulin regular Inject 0.04 mLs (4 Units total) into the skin 2 (two) times daily before a meal. What changed:  how much to take  additional instructions   potassium chloride 10 MEQ tablet Commonly known as:  K-DUR Take 1 tablet (10 mEq total) by mouth daily.   prasugrel 10 MG Tabs tablet Commonly known as:  EFFIENT Take 1 tablet (10 mg total) by mouth daily. What changed:  when to take this   tamsulosin 0.4 MG Caps capsule Commonly known as:  FLOMAX Take 0.4 mg by mouth daily after supper.   Travoprost (BAK Free) 0.004 %  Soln ophthalmic solution Commonly known as:  TRAVATAN Place 1 drop into both eyes at bedtime.   triamcinolone cream 0.1 % Commonly known as:  KENALOG Apply 1 application topically daily.   vitamin C 1000 MG tablet Take 1,000 mg by mouth daily.   Vitamin D 2000 units tablet Take 2,000 Units by mouth daily.      Follow-up Information    FULP, CAMMIE, MD. Schedule an appointment as soon as possible for a visit in 1 week(s).   Specialty:  Family Medicine Contact information: [address] [place] [address] [phone]            The results of significant diagnostics from this hospitalization (including imaging, microbiology, ancillary and laboratory) are listed below for reference.     Microbiology: Recent Results (from the past 240 hour(s))  Blood Culture (routine x 2)     Status: None (Preliminary result)   Collection Time: 04/01/16  6:25 PM  Result Value Ref Range Status   Specimen Description BLOOD RIGHT ANTECUBITAL  Final   Special Requests BOTTLES DRAWN AEROBIC AND ANAEROBIC 4 CC EACH  Final   Culture   Final    NO GROWTH 4 DAYS Performed at [facility]    Report Status PENDING  Incomplete  Blood Culture (routine x 2)     Status: Abnormal   Collection Time: 04/01/16  6:27 PM  Result Value Ref Range Status   Specimen Description BLOOD BLOOD LEFT FOREARM  Final   Special Requests IN PEDIATRIC BOTTLE 1 CC  Final   Culture  Setup Time   Final    GRAM POSITIVE COCCI IN CLUSTERS IN PEDIATRIC BOTTLE CRITICAL RESULT CALLED TO, READ BACK BY AND VERIFIED WITH: J. Clio, PHARM AT [place] ON [id number] BY M. KELLY    Culture (A)  Final    STAPHYLOCOCCUS SPECIES (COAGULASE NEGATIVE) THE SIGNIFICANCE OF ISOLATING THIS ORGANISM FROM A SINGLE SET OF BLOOD CULTURES WHEN MULTIPLE SETS ARE DRAWN IS UNCERTAIN. PLEASE NOTIFY THE MICROBIOLOGY DEPARTMENT WITHIN ONE WEEK IF SPECIATION AND SENSITIVITIES ARE REQUIRED. Performed at [facility]  Report Status  04/04/2016 FINAL  Final  Blood Culture ID Panel (Reflexed)     Status: Abnormal   Collection Time: 04/01/16  6:27 PM  Result Value Ref Range Status   Enterococcus species NOT DETECTED NOT DETECTED Final   Listeria monocytogenes NOT DETECTED NOT DETECTED Final   Staphylococcus species DETECTED (A) NOT DETECTED Final    Comment: CRITICAL RESULT CALLED TO, READ BACK BY AND VERIFIED WITH: JULIAN GRIMSLEY,PHARMD _0  04/03/16 MKELLY    Staphylococcus aureus NOT DETECTED NOT DETECTED Final   Methicillin resistance NOT DETECTED NOT DETECTED Final   Streptococcus species NOT DETECTED NOT DETECTED Final   Streptococcus agalactiae NOT DETECTED NOT DETECTED Final   Streptococcus pneumoniae NOT DETECTED NOT DETECTED Final   Streptococcus pyogenes NOT DETECTED NOT DETECTED Final   Acinetobacter baumannii NOT DETECTED NOT DETECTED Final   Enterobacteriaceae species NOT DETECTED NOT DETECTED Final   Enterobacter cloacae complex NOT DETECTED NOT DETECTED Final   Escherichia coli NOT DETECTED NOT DETECTED Final   Klebsiella oxytoca NOT DETECTED NOT DETECTED Final   Klebsiella pneumoniae NOT DETECTED NOT DETECTED Final   Proteus species NOT DETECTED NOT DETECTED Final   Serratia marcescens NOT DETECTED NOT DETECTED Final   Haemophilus influenzae NOT DETECTED NOT DETECTED Final   Neisseria meningitidis NOT DETECTED NOT DETECTED Final   Pseudomonas aeruginosa NOT DETECTED NOT DETECTED Final   Candida albicans NOT DETECTED NOT DETECTED Final   Candida glabrata NOT DETECTED NOT DETECTED Final   Candida krusei NOT DETECTED NOT DETECTED Final   Candida parapsilosis NOT DETECTED NOT DETECTED Final   Candida tropicalis NOT DETECTED NOT DETECTED Final    Comment: Performed at [facility]  Urine culture     Status: Abnormal   Collection Time: 04/01/16  6:58 PM  Result Value Ref Range Status   Specimen Description URINE, RANDOM  Final   Special Requests NONE  Final   Culture MULTIPLE SPECIES  PRESENT, SUGGEST RECOLLECTION (A)  Final   Report Status 04/03/2016 FINAL  Final  Culture, Urine     Status: None   Collection Time: 04/03/16 12:01 PM  Result Value Ref Range Status   Specimen Description URINE, CLEAN CATCH  Final   Special Requests NONE  Final   Culture NO GROWTH Performed at [facility]   Final   Report Status 04/04/2016 FINAL  Final  C difficile quick scan w PCR reflex     Status: None   Collection Time: 04/04/16  7:41 AM  Result Value Ref Range Status   C Diff antigen NEGATIVE NEGATIVE Final   C Diff toxin NEGATIVE NEGATIVE Final   C Diff interpretation No C. difficile detected.  Final     Labs: Basic Metabolic Panel:  Recent Labs Lab 04/01/16 1827 04/01/16 1840 04/02/16 0542 04/03/16 0457 04/03/16 0505 04/04/16 0522 04/05/16 0533  NA 141 140 137 135  --  135 134*  K 4.7 4.7 4.6 4.6  --  4.5 4.3  CL 112* 111 110 109  --  107 106  CO2 20*  --  18* 19*  --  18* 20*  GLUCOSE 74 71 185* 194*  --  191* 193*  BUN 81* 71* 78* 84*  --  83* 77*  CREATININE 3.23* 3.50* 3.22* 3.41*  --  3.37* 3.35*  CALCIUM 9.6  --  8.6* 8.7*  --  8.9 8.8*  PHOS  --   --   --   --  3.6  --  4.4   Liver  Function Tests:  Recent Labs Lab 04/01/16 1827 04/02/16 0542 04/05/16 0533  AST 18 15  --   ALT 24 21  --   ALKPHOS 55 40  --   BILITOT 0.8 0.4  --   PROT 7.2 5.9*  --   ALBUMIN 4.4 3.5 3.3*   CBC:  Recent Labs Lab 04/01/16 1827 04/01/16 1840 04/02/16 0542 04/03/16 0457 04/04/16 0522 04/05/16 0533  WBC 19.5*  --  21.3* 16.3* 13.3* 12.8*  NEUTROABS 10.9*  --   --   --   --   --   HGB 10.7* 10.9* 9.3* 9.4* 9.2* 9.4*  HCT 31.8* 32.0* 27.9* 28.4* 27.2* 28.1*  MCV 87.6  --  89.1 89.0 85.8 87.8  PLT 211  --  195 175 180 214   Cardiac Enzymes:  Recent Labs Lab 04/01/16 2221  TROPONINI 0.03*   BNP: BNP (last 3 results)  Recent Labs  04/01/16 2221  BNP 135.2*   CBG:  Recent Labs Lab 04/04/16 1225 04/04/16 1709 04/04/16 2243  04/05/16 0727 04/05/16 1140  GLUCAP 222* 257* 201* 174* 218*     SIGNED: Time coordinating discharge:  30 minutes  MAGICK-Shaughn Thomley, MD  [facility] 04/05/2016, 9:49 PM Pager [phone]  If 7PM-7AM, please contact night-coverage [email or web address] Password [email or web address]

## 2016-04-06 LAB — CULTURE, BLOOD (ROUTINE X 2): CULTURE: NO GROWTH

## 2016-04-17 DIAGNOSIS — A419 Sepsis, unspecified organism: Secondary | ICD-10-CM | POA: Diagnosis not present

## 2016-04-17 DIAGNOSIS — Z794 Long term (current) use of insulin: Secondary | ICD-10-CM | POA: Diagnosis not present

## 2016-04-17 DIAGNOSIS — I10 Essential (primary) hypertension: Secondary | ICD-10-CM | POA: Diagnosis not present

## 2016-04-17 DIAGNOSIS — D638 Anemia in other chronic diseases classified elsewhere: Secondary | ICD-10-CM | POA: Diagnosis not present

## 2016-04-17 DIAGNOSIS — R1011 Right upper quadrant pain: Secondary | ICD-10-CM | POA: Diagnosis not present

## 2016-04-17 DIAGNOSIS — I5032 Chronic diastolic (congestive) heart failure: Secondary | ICD-10-CM | POA: Diagnosis not present

## 2016-04-17 DIAGNOSIS — N183 Chronic kidney disease, stage 3 (moderate): Secondary | ICD-10-CM | POA: Diagnosis not present

## 2016-04-17 DIAGNOSIS — R93429 Abnormal radiologic findings on diagnostic imaging of unspecified kidney: Secondary | ICD-10-CM | POA: Diagnosis not present

## 2016-04-17 DIAGNOSIS — E1122 Type 2 diabetes mellitus with diabetic chronic kidney disease: Secondary | ICD-10-CM | POA: Diagnosis not present

## 2016-04-20 DIAGNOSIS — I2510 Atherosclerotic heart disease of native coronary artery without angina pectoris: Secondary | ICD-10-CM

## 2016-04-20 HISTORY — DX: Atherosclerotic heart disease of native coronary artery without angina pectoris: I25.10

## 2016-05-12 ENCOUNTER — Other Ambulatory Visit (INDEPENDENT_AMBULATORY_CARE_PROVIDER_SITE_OTHER): Payer: Medicare Other

## 2016-05-12 DIAGNOSIS — Z794 Long term (current) use of insulin: Secondary | ICD-10-CM | POA: Diagnosis not present

## 2016-05-12 DIAGNOSIS — E1165 Type 2 diabetes mellitus with hyperglycemia: Secondary | ICD-10-CM

## 2016-05-12 LAB — HEMOGLOBIN A1C: HEMOGLOBIN A1C: 6.6 % — AB (ref 4.6–6.5)

## 2016-05-12 LAB — LIPID PANEL
CHOLESTEROL: 147 mg/dL (ref 0–200)
HDL: 38.5 mg/dL — AB (ref >39.00)
LDL Cholesterol: 83 mg/dL (ref 0–99)
NonHDL: 108.53
TRIGLYCERIDES: 127 mg/dL (ref 0.0–149.0)
Total CHOL/HDL Ratio: 4
VLDL: 25.4 mg/dL (ref 0.0–40.0)

## 2016-05-12 LAB — MICROALBUMIN / CREATININE URINE RATIO
CREATININE, U: 116 mg/dL
MICROALB/CREAT RATIO: 34.4 mg/g — AB (ref 0.0–30.0)
Microalb, Ur: 39.9 mg/dL — ABNORMAL HIGH (ref 0.0–1.9)

## 2016-05-12 LAB — BASIC METABOLIC PANEL
BUN: 63 mg/dL — ABNORMAL HIGH (ref 6–23)
CHLORIDE: 108 meq/L (ref 96–112)
CO2: 22 meq/L (ref 19–32)
Calcium: 9.4 mg/dL (ref 8.4–10.5)
Creatinine, Ser: 3.1 mg/dL — ABNORMAL HIGH (ref 0.40–1.50)
GFR: 20.79 mL/min — ABNORMAL LOW (ref >60.00)
Glucose, Bld: 104 mg/dL — ABNORMAL HIGH (ref 70–99)
Potassium: 4.5 mEq/L (ref 3.5–5.1)
SODIUM: 137 meq/L (ref 135–145)

## 2016-05-15 ENCOUNTER — Encounter: Payer: Self-pay | Admitting: Endocrinology

## 2016-05-15 ENCOUNTER — Other Ambulatory Visit: Payer: 59

## 2016-05-15 ENCOUNTER — Ambulatory Visit (INDEPENDENT_AMBULATORY_CARE_PROVIDER_SITE_OTHER): Payer: Medicare Other | Admitting: Endocrinology

## 2016-05-15 VITALS — BP 144/62 | HR 64 | Ht 75.0 in | Wt 246.0 lb

## 2016-05-15 DIAGNOSIS — E1165 Type 2 diabetes mellitus with hyperglycemia: Secondary | ICD-10-CM

## 2016-05-15 DIAGNOSIS — E041 Nontoxic single thyroid nodule: Secondary | ICD-10-CM

## 2016-05-15 DIAGNOSIS — Z794 Long term (current) use of insulin: Secondary | ICD-10-CM

## 2016-05-15 NOTE — Patient Instructions (Signed)
N insulin 1o units  More sugars at 9-10 pm  Rotate timing of testing

## 2016-05-15 NOTE — Progress Notes (Signed)
Patient ID: Ray Merritt, male   DOB: 17-Apr-1936, 80 y.o.   MRN: [id number]           Reason for Appointment: Follow-up for Type 2 Diabetes  Referring physician: Fulp  History of Present Illness:          Date of diagnosis of type 2 diabetes mellitus : 1982        Background history:   He has been on insulin since the time of diagnosis, initially his blood sugars were significantly high ; he was symptomatic with dry mouth and blurred vision. He thinks he was probably taking NPH and Regular Insulin for quite some time and before his consultation was only on NPH 3 years ago he was put on Bydureon weekly in addition to NPH.  He thinks that it initially curb his appetite but he does not think he lost any significant amount of weight.  Also he does not think his blood sugar control was much better For about a year he had been switched from Bydureon to Tanzeum weekly  His  Tanzeum was stopped because of lack of benefit A1c has been as high as 11.8 in 2/16  Recent history:   INSULIN regimen is : Novolin NPH 13 units acb and 26 units bedtime, Regular Insulin 6 units at breakfast and 7 supper   On his initial consultation because of high postprandial readings he was started on Regular Insulin before each meal   A1c is significantly better at 6.6, has been as high as 7.6  Current blood sugar patterns and problems identified:  He has checked blood sugars mostly before meals and usually not after supper  His blood sugars are excellent with very little variability before meals  Fasting blood sugars are fairly close to normal without being low.  Lunchtime blood sugars are not checked very much but they are mostly near normal and lowest reading was 64  This is despite reducing his NPH on the last visit  He tends to forget to check his blood sugars after supper but no hypoglycemia reported  He has lost weight on this visit with trying to watch his diet but also was admitted to the  Hospital last month  He is very compliant with taking his insulin on time before meals  Other hypoglycemic drugs the patient is taking are:   none Side effects from medications have been: None  Compliance with the medical regimen: Good Hypoglycemia: with activity symptoms are weakness and feeling sweaty, treated with juice or other sweet drinks, usually can recognize blood sugars in the low 60s   Glucose monitoring:  done 3 times a day         Glucometer:  Contour      Blood Glucose readings by  review of his monitor  Mean values apply above for all meters except median for One Touch  PRE-MEAL Fasting Lunch Dinner Bedtime Overall  Glucose range: 74-152  64-118  76-147     Mean/median: 109 90 107  10    Self-care: The diet that the patient has been following is: tries to limit portions .     Typical meal intake: Breakfast is old-fashioned oatmeal, lunch is usually a sandwich.  He has mostly popcorn for snacks   Dinner time is usually 6-7              Dietician visit, most recent: none  CDE consultation: 7/16  Exercise: walking or gardening  Weight history:  Wt Readings from Last 3 Encounters:  05/15/16 246 lb (111.6 kg)  04/05/16 251 lb 12.3 oz (114.2 kg)  03/11/16 250 lb 12.8 oz (113.8 kg)    Glycemic control:   Lab Results  Component Value Date   HGBA1C 6.6 (H) 05/12/2016   HGBA1C 7.3 (H) 01/21/2016   HGBA1C 7.6 (H) 12/10/2015   Lab Results  Component Value Date   MICROALBUR 39.9 (H) 05/12/2016   LDLCALC 83 05/12/2016   CREATININE 3.10 (H) 05/12/2016        Medication List       Accurate as of 05/15/16  1:36 PM. Always use your most recent med list.          ALPHAGAN P 0.1 % Soln Generic drug:  brimonidine Place 1 drop into both eyes 2 (two) times daily.   aspirin 81 MG tablet Take 81 mg by mouth daily.   atorvastatin 10 MG tablet Commonly known as:  LIPITOR Take 20 mg by mouth daily.   carvedilol 25 MG tablet Commonly known as:   COREG Take 25 mg by mouth 2 (two) times daily with a meal.   cloNIDine 0.1 MG tablet Commonly known as:  CATAPRES Take 0.1 mg by mouth 2 (two) times daily.   diphenhydramine-acetaminophen 25-500 MG Tabs tablet Commonly known as:  TYLENOL PM Take 2 tablets by mouth at bedtime.   finasteride 5 MG tablet Commonly known as:  PROSCAR Take 5 mg by mouth daily.   Fish Oil 1200 MG Caps Take 1 capsule by mouth daily.   furosemide 40 MG tablet Commonly known as:  LASIX Take 1 tablet (40 mg total) by mouth daily.   glucose blood test strip Commonly known as:  BAYER CONTOUR NEXT TEST Use as instructed to check blood sugar 2 times per day dx code E11.65   hydrALAZINE 50 MG tablet Commonly known as:  APRESOLINE Take 50 mg by mouth 3 (three) times daily.   insulin NPH Human 100 UNIT/ML injection Commonly known as:  HUMULIN N,NOVOLIN N Inject 0.15 mLs (15 Units total) into the skin 2 (two) times daily before a meal.   Iron 325 (65 Fe) MG Tabs Take 1 tablet by mouth daily.   isosorbide mononitrate 120 MG 24 hr tablet Commonly known as:  IMDUR Take 120 mg by mouth daily.   levofloxacin 500 MG tablet Commonly known as:  LEVAQUIN Take 1 tablet (500 mg total) by mouth every other day.   minoxidil 2.5 MG tablet Commonly known as:  LONITEN Take 2.5 mg by mouth daily.   multivitamin with minerals Tabs tablet Take 1 tablet by mouth daily.   NOVOLIN R 100 units/mL injection Generic drug:  insulin regular Inject 0.04 mLs (4 Units total) into the skin 2 (two) times daily before a meal.   potassium chloride 10 MEQ tablet Commonly known as:  K-DUR Take 1 tablet (10 mEq total) by mouth daily.   prasugrel 10 MG Tabs tablet Commonly known as:  EFFIENT Take 1 tablet (10 mg total) by mouth daily.   tamsulosin 0.4 MG Caps capsule Commonly known as:  FLOMAX Take 0.4 mg by mouth daily after supper.   Travoprost (BAK Free) 0.004 % Soln ophthalmic solution Commonly known as:   TRAVATAN Place 1 drop into both eyes at bedtime.   triamcinolone cream 0.1 % Commonly known as:  KENALOG Apply 1 application topically daily.   vitamin C 1000 MG tablet Take 1,000 mg by mouth daily.  Vitamin D 2000 units tablet Take 2,000 Units by mouth daily.       Allergies: No Known Allergies  Past Medical History:  Diagnosis Date  . Anemia   . Arthritis   . Cancer ([place])   . Chronic kidney disease   . Coronary artery disease   . Diabetes mellitus    insulin dependent   . Enlarged prostate   . Hypertension     Past Surgical History:  Procedure Laterality Date  . CARDIAC CATHETERIZATION N/A 05/01/2015   Procedure: Left Heart Cath and Coronary Angiography;  Surgeon: Adrian Prows, MD;  Location: [facility];  Service: Cardiovascular;  Laterality: N/A;  . CARDIAC CATHETERIZATION  05/01/2015   Procedure: Intravascular Pressure Wire/FFR Study;  Surgeon: Adrian Prows, MD;  Location: [facility];  Service: Cardiovascular;;  . CARDIAC CATHETERIZATION N/A 05/22/2015   Procedure: Left Heart Cath and Coronary Angiography;  Surgeon: Adrian Prows, MD;  Location: [facility];  Service: Cardiovascular;  Laterality: N/A;  . CARDIAC CATHETERIZATION N/A 05/22/2015   Procedure: Coronary Stent Intervention;  Surgeon: Adrian Prows, MD;  Location: [facility];  Service: Cardiovascular;  Laterality: N/A;  . SHOULDER SURGERY      Family History  Problem Relation Age of Onset  . Hypertension Mother   . Heart disease Sister   . Stroke Sister   . Diabetes Brother     Social History:  reports that he has quit smoking. He has never used smokeless tobacco. He reports that he does not drink alcohol or use drugs.    Review of Systems   THYROID nodule:  He has a dominant left-sided thyroid nodule and did have a biopsy done Results as follows: BENIGN FOLLICULAR CELLS, COLLOID AND MACROPHAGES, CONSISTENT WITH  BENIGN GOITROUS NODULE   Lipid history: Has been on medication  for a few years with Lipitor   Lab Results  Component Value Date   CHOL 147 05/12/2016   HDL 38.50 (L) 05/12/2016   LDLCALC 83 05/12/2016   TRIG 127.0 05/12/2016   CHOLHDL 4 05/12/2016          Eyes: Marland Kitchen  Most recent eye exam was in 3/17  Has occasional tingling in his feet  Diabetic foot exam done in 10/17  RENAL dysfunction: He is followed by nephrologist And the Creatinine level is overall stable   Lab Results  Component Value Date   CREATININE 3.10 (H) 05/12/2016   CREATININE 3.35 (H) 04/05/2016   CREATININE 3.37 (H) 04/04/2016   HYPERTENSIONAnd edema: This is followed by cardiologist and nephrologist as well as PCP  Physical Examination:  BP (!) 144/62 (BP Location: Left Arm)   Pulse 64   Ht 6\' 3"  (1.905 m)   Wt 246 lb (111.6 kg)   SpO2 97%   BMI 30.75 kg/m     Diabetic Foot Exam - Simple   Simple Foot Form Diabetic Foot exam was performed with the following findings:  Yes 05/15/2016  9:31 AM  Visual Inspection No deformities, no ulcerations, no other skin breakdown bilaterally:  Yes See comments:  Yes Sensation Testing Intact to touch and monofilament testing bilaterally:  Yes Pulse Check Posterior Tibialis and Dorsalis pulse intact bilaterally:  Yes See comments:  Yes Comments 2+ left and 1+ right ankle edema present Dorsalis pedis pulse is not palpable Significant onychomycosis of toes present Mild thickening and scaling of skin present        ASSESSMENT:  Diabetes type 2, uncontrolled  See history of present illness for detailed discussion of his current management, blood sugar patterns and problems identified  Overall has good control on NPH twice a day and Regular Insulin at breakfast and supper.  Currently not having much fluctuation in sugars even with taking NPH Again tends to have low normal readings made day A1c is the best in the long time Not checking readings after supper  HYPERTENSION:  Controlled, follow-up with nephrologist  to be done  PLAN:   He will need to reduce his morning NPH 10 units now  Check more readings in between meals and especially more after supper    Does not need to check fasting readings  He will call if he has any further hypoglycemia.    Patient Instructions  N insulin 1o units  More sugars at 9-10 pm  Rotate timing of testing     [facility] 05/15/2016, 1:36 PM   Note: This office note was prepared with Estate agent. Any transcriptional errors that result from this process are unintentional.

## 2016-05-19 DIAGNOSIS — E8770 Fluid overload, unspecified: Secondary | ICD-10-CM | POA: Diagnosis not present

## 2016-05-19 DIAGNOSIS — N183 Chronic kidney disease, stage 3 (moderate): Secondary | ICD-10-CM | POA: Diagnosis not present

## 2016-05-19 DIAGNOSIS — C9110 Chronic lymphocytic leukemia of B-cell type not having achieved remission: Secondary | ICD-10-CM | POA: Diagnosis not present

## 2016-05-19 DIAGNOSIS — I10 Essential (primary) hypertension: Secondary | ICD-10-CM | POA: Diagnosis not present

## 2016-06-03 DIAGNOSIS — N401 Enlarged prostate with lower urinary tract symptoms: Secondary | ICD-10-CM | POA: Diagnosis not present

## 2016-06-09 DIAGNOSIS — R972 Elevated prostate specific antigen [PSA]: Secondary | ICD-10-CM | POA: Diagnosis not present

## 2016-06-09 DIAGNOSIS — N400 Enlarged prostate without lower urinary tract symptoms: Secondary | ICD-10-CM | POA: Diagnosis not present

## 2016-06-18 DIAGNOSIS — C9110 Chronic lymphocytic leukemia of B-cell type not having achieved remission: Secondary | ICD-10-CM | POA: Diagnosis not present

## 2016-06-18 DIAGNOSIS — N2581 Secondary hyperparathyroidism of renal origin: Secondary | ICD-10-CM | POA: Diagnosis not present

## 2016-06-18 DIAGNOSIS — E8770 Fluid overload, unspecified: Secondary | ICD-10-CM | POA: Diagnosis not present

## 2016-06-18 DIAGNOSIS — N183 Chronic kidney disease, stage 3 (moderate): Secondary | ICD-10-CM | POA: Diagnosis not present

## 2016-06-18 DIAGNOSIS — I10 Essential (primary) hypertension: Secondary | ICD-10-CM | POA: Diagnosis not present

## 2016-07-16 DIAGNOSIS — H401131 Primary open-angle glaucoma, bilateral, mild stage: Secondary | ICD-10-CM | POA: Diagnosis not present

## 2016-07-30 DIAGNOSIS — E1122 Type 2 diabetes mellitus with diabetic chronic kidney disease: Secondary | ICD-10-CM | POA: Diagnosis not present

## 2016-07-30 DIAGNOSIS — N184 Chronic kidney disease, stage 4 (severe): Secondary | ICD-10-CM | POA: Diagnosis not present

## 2016-07-30 DIAGNOSIS — E1165 Type 2 diabetes mellitus with hyperglycemia: Secondary | ICD-10-CM | POA: Diagnosis not present

## 2016-07-30 DIAGNOSIS — I25119 Atherosclerotic heart disease of native coronary artery with unspecified angina pectoris: Secondary | ICD-10-CM | POA: Diagnosis not present

## 2016-08-08 ENCOUNTER — Emergency Department (HOSPITAL_COMMUNITY): Payer: Medicare Other

## 2016-08-08 ENCOUNTER — Inpatient Hospital Stay (HOSPITAL_COMMUNITY)
Admission: EM | Admit: 2016-08-08 | Discharge: 2016-08-10 | DRG: 872 | Disposition: A | Discharge status: Home / Self Care | Payer: Medicare Other | Attending: Family Medicine | Admitting: Family Medicine

## 2016-08-08 ENCOUNTER — Encounter (HOSPITAL_COMMUNITY): Payer: Self-pay | Admitting: *Deleted

## 2016-08-08 DIAGNOSIS — R509 Fever, unspecified: Secondary | ICD-10-CM | POA: Diagnosis not present

## 2016-08-08 DIAGNOSIS — Z79899 Other long term (current) drug therapy: Secondary | ICD-10-CM

## 2016-08-08 DIAGNOSIS — Z955 Presence of coronary angioplasty implant and graft: Secondary | ICD-10-CM

## 2016-08-08 DIAGNOSIS — Z794 Long term (current) use of insulin: Secondary | ICD-10-CM

## 2016-08-08 DIAGNOSIS — N400 Enlarged prostate without lower urinary tract symptoms: Secondary | ICD-10-CM | POA: Diagnosis present

## 2016-08-08 DIAGNOSIS — E860 Dehydration: Secondary | ICD-10-CM | POA: Diagnosis present

## 2016-08-08 DIAGNOSIS — E119 Type 2 diabetes mellitus without complications: Secondary | ICD-10-CM | POA: Diagnosis not present

## 2016-08-08 DIAGNOSIS — C9110 Chronic lymphocytic leukemia of B-cell type not having achieved remission: Secondary | ICD-10-CM | POA: Diagnosis not present

## 2016-08-08 DIAGNOSIS — N179 Acute kidney failure, unspecified: Secondary | ICD-10-CM | POA: Diagnosis present

## 2016-08-08 DIAGNOSIS — Z8249 Family history of ischemic heart disease and other diseases of the circulatory system: Secondary | ICD-10-CM | POA: Diagnosis not present

## 2016-08-08 DIAGNOSIS — E1122 Type 2 diabetes mellitus with diabetic chronic kidney disease: Secondary | ICD-10-CM | POA: Diagnosis present

## 2016-08-08 DIAGNOSIS — N184 Chronic kidney disease, stage 4 (severe): Secondary | ICD-10-CM | POA: Diagnosis present

## 2016-08-08 DIAGNOSIS — R05 Cough: Secondary | ICD-10-CM | POA: Diagnosis not present

## 2016-08-08 DIAGNOSIS — N189 Chronic kidney disease, unspecified: Secondary | ICD-10-CM | POA: Diagnosis not present

## 2016-08-08 DIAGNOSIS — I129 Hypertensive chronic kidney disease with stage 1 through stage 4 chronic kidney disease, or unspecified chronic kidney disease: Secondary | ICD-10-CM | POA: Diagnosis present

## 2016-08-08 DIAGNOSIS — A419 Sepsis, unspecified organism: Principal | ICD-10-CM | POA: Diagnosis present

## 2016-08-08 DIAGNOSIS — R6510 Systemic inflammatory response syndrome (SIRS) of non-infectious origin without acute organ dysfunction: Secondary | ICD-10-CM

## 2016-08-08 DIAGNOSIS — Z833 Family history of diabetes mellitus: Secondary | ICD-10-CM

## 2016-08-08 DIAGNOSIS — I2510 Atherosclerotic heart disease of native coronary artery without angina pectoris: Secondary | ICD-10-CM | POA: Diagnosis present

## 2016-08-08 DIAGNOSIS — Z7982 Long term (current) use of aspirin: Secondary | ICD-10-CM | POA: Diagnosis not present

## 2016-08-08 LAB — COMPREHENSIVE METABOLIC PANEL
ALK PHOS: 61 U/L (ref 38–126)
ALT: 19 U/L (ref 17–63)
ANION GAP: 11 (ref 5–15)
AST: 18 U/L (ref 15–41)
Albumin: 4.3 g/dL (ref 3.5–5.0)
BILIRUBIN TOTAL: 0.8 mg/dL (ref 0.3–1.2)
BUN: 88 mg/dL — ABNORMAL HIGH (ref 6–20)
CALCIUM: 9.8 mg/dL (ref 8.9–10.3)
CO2: 20 mmol/L — AB (ref 22–32)
CREATININE: 3.77 mg/dL — AB (ref 0.61–1.24)
Chloride: 108 mmol/L (ref 101–111)
GFR, EST AFRICAN AMERICAN: 16 mL/min — AB (ref >60)
GFR, EST NON AFRICAN AMERICAN: 14 mL/min — AB (ref >60)
Glucose, Bld: 125 mg/dL — ABNORMAL HIGH (ref 65–99)
Potassium: 4.4 mmol/L (ref 3.5–5.1)
SODIUM: 139 mmol/L (ref 135–145)
TOTAL PROTEIN: 7 g/dL (ref 6.5–8.1)

## 2016-08-08 LAB — CBC WITH DIFFERENTIAL/PLATELET
Basophils Absolute: 0 10*3/uL (ref 0.0–0.1)
Basophils Relative: 0 %
EOS ABS: 0.2 10*3/uL (ref 0.0–0.7)
EOS PCT: 1 %
HCT: 31.8 % — ABNORMAL LOW (ref 39.0–52.0)
Hemoglobin: 10.7 g/dL — ABNORMAL LOW (ref 13.0–17.0)
LYMPHS ABS: 8 10*3/uL — AB (ref 0.7–4.0)
Lymphocytes Relative: 35 %
MCH: 29.6 pg (ref 26.0–34.0)
MCHC: 33.6 g/dL (ref 30.0–36.0)
MCV: 88.1 fL (ref 78.0–100.0)
MONO ABS: 1.4 10*3/uL — AB (ref 0.1–1.0)
Monocytes Relative: 6 %
NEUTROS PCT: 58 %
Neutro Abs: 13.3 10*3/uL — ABNORMAL HIGH (ref 1.7–7.7)
PLATELETS: 226 10*3/uL (ref 150–400)
RBC: 3.61 MIL/uL — AB (ref 4.22–5.81)
RDW: 12.9 % (ref 11.5–15.5)
WBC: 22.9 10*3/uL — AB (ref 4.0–10.5)

## 2016-08-08 LAB — URINALYSIS, ROUTINE W REFLEX MICROSCOPIC
Bilirubin Urine: NEGATIVE
Glucose, UA: NEGATIVE mg/dL
Hgb urine dipstick: NEGATIVE
Ketones, ur: NEGATIVE mg/dL
Leukocytes, UA: NEGATIVE
NITRITE: NEGATIVE
PH: 5 (ref 5.0–8.0)
Protein, ur: NEGATIVE mg/dL
SPECIFIC GRAVITY, URINE: 1.009 (ref 1.005–1.030)

## 2016-08-08 LAB — I-STAT CG4 LACTIC ACID, ED
LACTIC ACID, VENOUS: 1.88 mmol/L (ref 0.5–1.9)
Lactic Acid, Venous: 1.68 mmol/L (ref 0.5–1.9)

## 2016-08-08 LAB — INFLUENZA PANEL BY PCR (TYPE A & B)
INFLAPCR: NEGATIVE
INFLBPCR: NEGATIVE

## 2016-08-08 LAB — PROTIME-INR
INR: 0.99
PROTHROMBIN TIME: 13.1 s (ref 11.4–15.2)

## 2016-08-08 MED ORDER — MORPHINE SULFATE (PF) 2 MG/ML IV SOLN
2.0000 mg | Freq: Once | INTRAVENOUS | Status: AC
  Administered 2016-08-08: 2 mg via INTRAVENOUS
  Filled 2016-08-08: qty 1

## 2016-08-08 MED ORDER — HYDRALAZINE HCL 50 MG PO TABS
50.0000 mg | ORAL_TABLET | Freq: Three times a day (TID) | ORAL | Status: DC
  Administered 2016-08-09 – 2016-08-10 (×5): 50 mg via ORAL
  Filled 2016-08-08 (×5): qty 1

## 2016-08-08 MED ORDER — INSULIN NPH (HUMAN) (ISOPHANE) 100 UNIT/ML ~~LOC~~ SUSP
26.0000 [IU] | Freq: Every day | SUBCUTANEOUS | Status: DC
  Administered 2016-08-09 (×2): 26 [IU] via SUBCUTANEOUS
  Filled 2016-08-08: qty 10

## 2016-08-08 MED ORDER — CARVEDILOL 25 MG PO TABS
25.0000 mg | ORAL_TABLET | Freq: Two times a day (BID) | ORAL | Status: DC
  Administered 2016-08-09 – 2016-08-10 (×3): 25 mg via ORAL
  Filled 2016-08-08 (×4): qty 1

## 2016-08-08 MED ORDER — ASPIRIN EC 81 MG PO TBEC
81.0000 mg | DELAYED_RELEASE_TABLET | Freq: Every day | ORAL | Status: DC
  Administered 2016-08-09 – 2016-08-10 (×2): 81 mg via ORAL
  Filled 2016-08-08 (×2): qty 1

## 2016-08-08 MED ORDER — ATORVASTATIN CALCIUM 10 MG PO TABS
20.0000 mg | ORAL_TABLET | Freq: Every day | ORAL | Status: DC
  Administered 2016-08-09 – 2016-08-10 (×2): 20 mg via ORAL
  Filled 2016-08-08 (×2): qty 2

## 2016-08-08 MED ORDER — VANCOMYCIN HCL IN DEXTROSE 1-5 GM/200ML-% IV SOLN: 1000.0000 mg | Freq: Once | INTRAVENOUS | Status: DC

## 2016-08-08 MED ORDER — INSULIN NPH (HUMAN) (ISOPHANE) 100 UNIT/ML ~~LOC~~ SUSP
10.0000 [IU] | Freq: Every day | SUBCUTANEOUS | Status: DC
  Administered 2016-08-09 – 2016-08-10 (×2): 10 [IU] via SUBCUTANEOUS
  Filled 2016-08-08: qty 10

## 2016-08-08 MED ORDER — MINOXIDIL 2.5 MG PO TABS
2.5000 mg | ORAL_TABLET | Freq: Every day | ORAL | Status: DC
  Administered 2016-08-09 – 2016-08-10 (×2): 2.5 mg via ORAL
  Filled 2016-08-08 (×2): qty 1

## 2016-08-08 MED ORDER — INSULIN ASPART 100 UNIT/ML ~~LOC~~ SOLN
0.0000 [IU] | Freq: Three times a day (TID) | SUBCUTANEOUS | Status: DC
  Administered 2016-08-09: 3 [IU] via SUBCUTANEOUS
  Administered 2016-08-09: 5 [IU] via SUBCUTANEOUS
  Administered 2016-08-09: 3 [IU] via SUBCUTANEOUS
  Administered 2016-08-10: 1 [IU] via SUBCUTANEOUS
  Administered 2016-08-10: 3 [IU] via SUBCUTANEOUS

## 2016-08-08 MED ORDER — PIPERACILLIN-TAZOBACTAM 3.375 G IVPB 30 MIN
3.3750 g | Freq: Once | INTRAVENOUS | Status: AC
  Administered 2016-08-08: 3.375 g via INTRAVENOUS
  Filled 2016-08-08: qty 50

## 2016-08-08 MED ORDER — LATANOPROST 0.005 % OP SOLN
1.0000 [drp] | Freq: Every day | OPHTHALMIC | Status: DC
  Administered 2016-08-09 (×2): 1 [drp] via OPHTHALMIC
  Filled 2016-08-08: qty 2.5

## 2016-08-08 MED ORDER — SODIUM CHLORIDE 0.9 % IV BOLUS (SEPSIS)
1000.0000 mL | Freq: Once | INTRAVENOUS | Status: AC
  Administered 2016-08-08: 1000 mL via INTRAVENOUS

## 2016-08-08 MED ORDER — OSELTAMIVIR PHOSPHATE 75 MG PO CAPS: 75.0000 mg | ORAL_CAPSULE | Freq: Once | ORAL | Status: DC

## 2016-08-08 MED ORDER — CLONIDINE HCL 0.1 MG PO TABS
0.1000 mg | ORAL_TABLET | Freq: Two times a day (BID) | ORAL | Status: DC
  Administered 2016-08-09 – 2016-08-10 (×4): 0.1 mg via ORAL
  Filled 2016-08-08 (×4): qty 1

## 2016-08-08 MED ORDER — ONDANSETRON HCL 4 MG PO TABS: 4.0000 mg | ORAL_TABLET | Freq: Four times a day (QID) | ORAL | Status: DC | PRN

## 2016-08-08 MED ORDER — VANCOMYCIN HCL 10 G IV SOLR
2250.0000 mg | INTRAVENOUS | Status: AC
  Administered 2016-08-08: 2250 mg via INTRAVENOUS
  Filled 2016-08-08: qty 2000

## 2016-08-08 MED ORDER — ACETAMINOPHEN 325 MG PO TABS: 650.0000 mg | ORAL_TABLET | Freq: Four times a day (QID) | ORAL | Status: DC | PRN

## 2016-08-08 MED ORDER — ENOXAPARIN SODIUM 30 MG/0.3ML ~~LOC~~ SOLN
30.0000 mg | Freq: Every day | SUBCUTANEOUS | Status: DC
  Administered 2016-08-09 (×2): 30 mg via SUBCUTANEOUS
  Filled 2016-08-08 (×2): qty 0.3

## 2016-08-08 MED ORDER — SODIUM CHLORIDE 0.9 % IV SOLN
INTRAVENOUS | Status: DC
  Administered 2016-08-08 – 2016-08-09 (×2): via INTRAVENOUS

## 2016-08-08 MED ORDER — VANCOMYCIN HCL 10 G IV SOLR
1500.0000 mg | INTRAVENOUS | Status: DC
  Filled 2016-08-08: qty 1500

## 2016-08-08 MED ORDER — MEPERIDINE HCL 50 MG/ML IJ SOLN: 25.0000 mg | INTRAMUSCULAR | Status: DC | PRN

## 2016-08-08 MED ORDER — BRIMONIDINE TARTRATE 0.15 % OP SOLN
1.0000 [drp] | Freq: Two times a day (BID) | OPHTHALMIC | Status: DC
  Administered 2016-08-09 – 2016-08-10 (×4): 1 [drp] via OPHTHALMIC
  Filled 2016-08-08: qty 5

## 2016-08-08 MED ORDER — VITAMIN C 500 MG PO TABS
1000.0000 mg | ORAL_TABLET | Freq: Every day | ORAL | Status: DC
  Administered 2016-08-09 – 2016-08-10 (×2): 1000 mg via ORAL
  Filled 2016-08-08 (×2): qty 2

## 2016-08-08 MED ORDER — MORPHINE SULFATE (PF) 4 MG/ML IV SOLN
4.0000 mg | Freq: Once | INTRAVENOUS | Status: AC
  Administered 2016-08-08: 4 mg via INTRAVENOUS
  Filled 2016-08-08: qty 1

## 2016-08-08 MED ORDER — TAMSULOSIN HCL 0.4 MG PO CAPS
0.4000 mg | ORAL_CAPSULE | Freq: Every day | ORAL | Status: DC
  Administered 2016-08-09: 0.4 mg via ORAL
  Filled 2016-08-08: qty 1

## 2016-08-08 MED ORDER — ONDANSETRON HCL 4 MG/2ML IJ SOLN: 4.0000 mg | Freq: Four times a day (QID) | INTRAMUSCULAR | Status: DC | PRN

## 2016-08-08 MED ORDER — ACETAMINOPHEN 650 MG RE SUPP
650.0000 mg | Freq: Once | RECTAL | Status: AC
  Administered 2016-08-08: 650 mg via RECTAL
  Filled 2016-08-08: qty 1

## 2016-08-08 MED ORDER — ACETAMINOPHEN 650 MG RE SUPP: 650.0000 mg | Freq: Four times a day (QID) | RECTAL | Status: DC | PRN

## 2016-08-08 MED ORDER — PIPERACILLIN-TAZOBACTAM 3.375 G IVPB
3.3750 g | Freq: Three times a day (TID) | INTRAVENOUS | Status: DC
  Administered 2016-08-09 – 2016-08-10 (×4): 3.375 g via INTRAVENOUS
  Filled 2016-08-08 (×4): qty 50

## 2016-08-08 MED ORDER — FERROUS SULFATE 325 (65 FE) MG PO TABS
325.0000 mg | ORAL_TABLET | Freq: Every day | ORAL | Status: DC
Start: 2016-08-09 — End: 2016-08-10
  Administered 2016-08-09 – 2016-08-10 (×2): 325 mg via ORAL
  Filled 2016-08-08 (×2): qty 1

## 2016-08-08 MED ORDER — MORPHINE SULFATE (PF) 2 MG/ML IV SOLN: 2.0000 mg | INTRAVENOUS | Status: DC | PRN

## 2016-08-08 MED ORDER — SODIUM CHLORIDE 0.9 % IV BOLUS (SEPSIS): 500.0000 mL | Freq: Once | INTRAVENOUS | Status: DC

## 2016-08-08 MED ORDER — FINASTERIDE 5 MG PO TABS
5.0000 mg | ORAL_TABLET | Freq: Every day | ORAL | Status: DC
  Administered 2016-08-09 – 2016-08-10 (×2): 5 mg via ORAL
  Filled 2016-08-08 (×2): qty 1

## 2016-08-08 MED ORDER — ISOSORBIDE MONONITRATE ER 60 MG PO TB24
120.0000 mg | ORAL_TABLET | Freq: Every day | ORAL | Status: DC
Start: 2016-08-09 — End: 2016-08-10
  Administered 2016-08-09 – 2016-08-10 (×2): 120 mg via ORAL
  Filled 2016-08-08 (×3): qty 2

## 2016-08-08 NOTE — ED Triage Notes (Signed)
Sudden onset of fever, chills, nausea and vomiting

## 2016-08-08 NOTE — H&P (Signed)
History and Physical    LADARIUS SEUBERT JEH:[id number] DOB: 07-Apr-1936 DOA: 08/08/2016  PCP: Sheryn Bison Consultants:  Einar Gip - cardiology; Florene Glen - nephrology; Dwyane Dee - endocrinology; Mohamed - oncology Patient coming from: home - lives with wife and daughter; Donald Prose: wife, [phone]  Chief Complaint: fever, rigors  HPI: Ray Merritt is a 81 y.o. male with medical history significant of HTN, BPH, DM, CAD s/p stent, CKD, and CLL presenting with c/o "I was kicking in the bed." He started feeling like he was getting a cold yesterday. Took some cold medicine.  This AM, felt better.  About 4pm, he developed rigors, "those shimmers that he got" about 3 different times.  Has h/o similar in the past, his wife reports that they would get his temperature down and let him go home usually.  Now reporting that he feels worse than when he came in.  T max 103.  +cough - nonproductive.  Emesis earlier.  Pain in head.  Did have a pain run up his left arm x 1, resolved spontaneously.  Prior admissions were 5/22-25 for sepsis from likely urinary source and 9/12-16 for sepsis of unknown etiology but also possibly urinary.  Urine culture from both admissions showed multiple species on the initial culture and no growth on the recollection.  ED Course: Per Dr. Maryan Rued: Patient was sudden onset of flulike symptoms around 4:00 this afternoon. Upon arrival here patient's temperature was 103 with mild tachypnea but normal heart rate and mildly elevated blood pressure. Patient was coughing but denied any shortness of breath oxygen saturation 96%. He had one episode of vomiting but no reproducible abdominal pain. Patient denied any recent urinary or bowel issues. He felt his normal self earlier today. Low suspicion that this is ACS, CHF or dissection. Feel most likely patient's symptoms are related to influenza or potential pneumonia. Lower suspicion for UTI.  Sepsis workup started. Patient does meet SIRS criteria. Patient given  Tylenol and labs pending  9:09 PM  Labs with new AKI of 3.77. Lactate normal. New leukocytosis of 22,000. CXR and uA without acute findings. Blood and urine cx pending. Flu neg. Patient does not display signs of meningitis. Patient is still weak and shaky. With extensive medical history and fever of unknown origin will admit for further care. Will discuss with hospitalist antibiotics.   Review of Systems: As per HPI; otherwise 10 point review of systems reviewed and negative.   Ambulatory Status:  Ambulates independently  Past Medical History:  Diagnosis Date  . Anemia   . Arthritis   . Cancer ([place]) 2015-present   CLL  . Chronic kidney disease   . Coronary artery disease 04/2016   1 stent  . Diabetes mellitus    insulin dependent   . Enlarged prostate   . Hypertension     Past Surgical History:  Procedure Laterality Date  . CARDIAC CATHETERIZATION N/A 05/01/2015   Procedure: Left Heart Cath and Coronary Angiography;  Surgeon: Adrian Prows, MD;  Location: [facility];  Service: Cardiovascular;  Laterality: N/A;  . CARDIAC CATHETERIZATION  05/01/2015   Procedure: Intravascular Pressure Wire/FFR Study;  Surgeon: Adrian Prows, MD;  Location: [facility];  Service: Cardiovascular;;  . CARDIAC CATHETERIZATION N/A 05/22/2015   Procedure: Left Heart Cath and Coronary Angiography;  Surgeon: Adrian Prows, MD;  Location: [facility];  Service: Cardiovascular;  Laterality: N/A;  . CARDIAC CATHETERIZATION N/A 05/22/2015   Procedure: Coronary Stent Intervention;  Surgeon: Adrian Prows, MD;  Location: [facility];  Service: Cardiovascular;  Laterality: N/A;  . SHOULDER SURGERY      Social History   Social History  . Marital status: Married    Spouse name: N/A  . Number of children: N/A  . Years of education: N/A   Occupational History  . retired    Social History Main Topics  . Smoking status: Former Research scientist (life sciences)  . Smokeless tobacco: Never Used     Comment: quit smoking  in early 70's  . Alcohol use No  . Drug use: No  . Sexual activity: Not on file   Other Topics Concern  . Not on file   Social History Narrative  . No narrative on file    No Known Allergies  Family History  Problem Relation Age of Onset  . Hypertension Mother   . Heart disease Sister   . Stroke Sister   . Diabetes Brother     Prior to Admission medications   Medication Sig Start Date End Date Taking? Authorizing Provider  ALPHAGAN P 0.1 % SOLN Place 1 drop into both eyes 2 (two) times daily. 10/17/15  Yes Historical Provider, MD  Ascorbic Acid (VITAMIN C) 1000 MG tablet Take 1,000 mg by mouth daily.   Yes Historical Provider, MD  aspirin 81 MG tablet Take 81 mg by mouth daily.   Yes Historical Provider, MD  atorvastatin (LIPITOR) 10 MG tablet Take 20 mg by mouth daily.    Yes Historical Provider, MD  carvedilol (COREG) 25 MG tablet Take 25 mg by mouth 2 (two) times daily with a meal.  03/30/16  Yes Historical Provider, MD  cholecalciferol (VITAMIN D) 1000 units tablet Take 1,000 Units by mouth daily.    Yes Historical Provider, MD  cloNIDine (CATAPRES) 0.1 MG tablet Take 0.1 mg by mouth 2 (two) times daily. 02/27/16  Yes Historical Provider, MD  diphenhydrAMINE (BENADRYL) 25 mg capsule Take 50 mg by mouth at bedtime.   Yes Historical Provider, MD  Ferrous Sulfate (IRON) 325 (65 Fe) MG TABS Take 1 tablet by mouth daily.   Yes Historical Provider, MD  finasteride (PROSCAR) 5 MG tablet Take 5 mg by mouth daily.   Yes Historical Provider, MD  furosemide (LASIX) 40 MG tablet Take 1 tablet (40 mg total) by mouth daily. Patient taking differently: Take 80-160 mg by mouth daily.  04/05/16  Yes Robbie Lis, MD  hydrALAZINE (APRESOLINE) 50 MG tablet Take 50 mg by mouth 3 (three) times daily. 04/25/15  Yes Historical Provider, MD  insulin NPH Human (HUMULIN N,NOVOLIN N) 100 UNIT/ML injection Inject 0.15 mLs (15 Units total) into the skin 2 (two) times daily before a meal. Patient taking  differently: Inject 10-26 Units into the skin 2 (two) times daily before a meal. 10 units in the morning and 26 units in the evening 12/13/15  Yes Belkys A Regalado, MD  isosorbide mononitrate (IMDUR) 120 MG 24 hr tablet Take 120 mg by mouth daily.   Yes Historical Provider, MD  minoxidil (LONITEN) 2.5 MG tablet Take 2.5 mg by mouth daily.   Yes Historical Provider, MD  Multiple Vitamin (MULTIVITAMIN WITH MINERALS) TABS tablet Take 1 tablet by mouth daily.   Yes Historical Provider, MD  NOVOLIN R 100 UNIT/ML injection Inject 0.04 mLs (4 Units total) into the skin 2 (two) times daily before a meal. Patient taking differently: Inject 6-7 Units into the skin 2 (two) times daily before a meal. Take 6 units in the am and Take 7 units at  bedtime 12/13/15  Yes Belkys A Regalado, MD  Omega-3 Fatty Acids (FISH OIL) 1200 MG CAPS Take 1 capsule by mouth daily.   Yes Historical Provider, MD  potassium chloride (K-DUR) 10 MEQ tablet Take 1 tablet (10 mEq total) by mouth daily. 04/05/16  Yes Robbie Lis, MD  tamsulosin (FLOMAX) 0.4 MG CAPS capsule Take 0.4 mg by mouth daily after supper.   Yes Historical Provider, MD  Travoprost, BAK Free, (TRAVATAN) 0.004 % SOLN ophthalmic solution Place 1 drop into both eyes at bedtime.   Yes Historical Provider, MD  triamcinolone cream (KENALOG) 0.1 % Apply 1 application topically daily.  10/12/14  Yes Historical Provider, MD  glucose blood (BAYER CONTOUR NEXT TEST) test strip Use as instructed to check blood sugar 2 times per day dx code E11.65 Patient taking differently: 1 each by Other route See admin instructions. Check blood sugar 3 times daily 01/26/15   Elayne Snare, MD  levofloxacin (LEVAQUIN) 500 MG tablet Take 1 tablet (500 mg total) by mouth every other day. Patient not taking: Reported on 08/08/2016 04/05/16   Robbie Lis, MD  prasugrel (EFFIENT) 10 MG TABS tablet Take 1 tablet (10 mg total) by mouth daily. Patient not taking: Reported on 08/08/2016 05/24/15   Neldon Labella, NP    Physical Exam: Vitals:   08/08/16 2215 08/08/16 2230 08/08/16 2245 08/08/16 2300  BP: (!) 175/53 170/67 171/55 (!) 157/54  Pulse: 73 76 77 76  Resp: 17 16 17 11   Temp:      TempSrc:      SpO2: 99% 91% 98% 96%  Weight:      Height:         General: Ill appearing but in NAD Eyes: PERRL, EOMI, normal lids, iris ENT:  grossly normal hearing, lips & tongue, mmm Neck:  no LAD, masses or thyromegaly Cardiovascular:  RRR, no m/r/g. No LE edema.  Respiratory:  CTA bilaterally, no w/r/r. Normal respiratory effort. Abdomen:  soft, ntnd, NABS Skin:  no rash or induration seen on limited exam Musculoskeletal:  grossly normal tone BUE/BLE, good ROM, no bony abnormality Psychiatric:  grossly normal mood and affect, speech fluent and appropriate, AOx3 Neurologic:  CN 2-12 grossly intact, moves all extremities in coordinated fashion, sensation intact  Labs on Admission: I have personally reviewed following labs and imaging studies  CBC:  Recent Labs Lab 08/08/16 1816  WBC 22.9*  NEUTROABS 13.3*  HGB 10.7*  HCT 31.8*  MCV 88.1  PLT 563   Basic Metabolic Panel:  Recent Labs Lab 08/08/16 1816  NA 139  K 4.4  CL 108  CO2 20*  GLUCOSE 125*  BUN 88*  CREATININE 3.77*  CALCIUM 9.8   GFR: Estimated Creatinine Clearance: 21.5 mL/min (by C-G formula based on SCr of 3.77 mg/dL (H)). Liver Function Tests:  Recent Labs Lab 08/08/16 1816  AST 18  ALT 19  ALKPHOS 61  BILITOT 0.8  PROT 7.0  ALBUMIN 4.3   No results for input(s): LIPASE, AMYLASE in the last 168 hours. No results for input(s): AMMONIA in the last 168 hours. Coagulation Profile:  Recent Labs Lab 08/08/16 1816  INR 0.99   Cardiac Enzymes: No results for input(s): CKTOTAL, CKMB, CKMBINDEX, TROPONINI in the last 168 hours. BNP (last 3 results) No results for input(s): PROBNP in the last 8760 hours. HbA1C: No results for input(s): HGBA1C in the last 72 hours. CBG: No results for input(s):  GLUCAP in the last 168 hours. Lipid Profile: No results for  input(s): CHOL, HDL, LDLCALC, TRIG, CHOLHDL, LDLDIRECT in the last 72 hours. Thyroid Function Tests: No results for input(s): TSH, T4TOTAL, FREET4, T3FREE, THYROIDAB in the last 72 hours. Anemia Panel: No results for input(s): VITAMINB12, FOLATE, FERRITIN, TIBC, IRON, RETICCTPCT in the last 72 hours. Urine analysis:    Component Value Date/Time   COLORURINE STRAW (A) 08/08/2016 1817   APPEARANCEUR CLEAR 08/08/2016 1817   LABSPEC 1.009 08/08/2016 1817   PHURINE 5.0 08/08/2016 1817   GLUCOSEU NEGATIVE 08/08/2016 1817   HGBUR NEGATIVE 08/08/2016 1817   BILIRUBINUR NEGATIVE 08/08/2016 1817   KETONESUR NEGATIVE 08/08/2016 1817   PROTEINUR NEGATIVE 08/08/2016 1817   UROBILINOGEN 0.2 10/07/2011 1203   NITRITE NEGATIVE 08/08/2016 1817   LEUKOCYTESUR NEGATIVE 08/08/2016 1817    Creatinine Clearance: Estimated Creatinine Clearance: 21.5 mL/min (by C-G formula based on SCr of 3.77 mg/dL (H)).  Sepsis Labs: @LABRCNTIP (procalcitonin:4,lacticidven:4) ) Recent Results (from the past 240 hour(s))  Culture, blood (Routine x 2)     Status: None (Preliminary result)   Collection Time: 08/08/16  6:39 PM  Result Value Ref Range Status   Specimen Description   Final    BLOOD RIGHT ANTECUBITAL Performed at [facility], [place] [address] [place], [place] [address]    Special Requests BOTTLES DRAWN AEROBIC AND ANAEROBIC 5CC  Final   Culture PENDING  Incomplete   Report Status PENDING  Incomplete     Radiological Exams on Admission: Dg Chest 2 View  Result Date: 08/08/2016 CLINICAL DATA:  Cough, chills today EXAM: CHEST  2 VIEW COMPARISON:  04/01/2016 FINDINGS: The heart size and mediastinal contours are within normal limits. Both lungs are clear. The visualized skeletal structures are unremarkable. IMPRESSION: No active cardiopulmonary disease. Electronically Signed   By: Kathreen Devoid   On: 08/08/2016 19:09    EKG: not  done  Assessment/Plan Principal Problem:   Sepsis ([place]) Active Problems:   CLL (chronic lymphocytic leukemia) (HCC)   Type 2 diabetes, HbA1c goal < 7% (HCC)   Acute kidney injury superimposed on chronic kidney disease (HCC)   Fever of unknown origin   Sepsis of uncertain etiology -Elevated WBC count (22.9, baseline about 12-13), fever with normal lactate but AKI present -Sepsis protocol initiated -Suspected urinary source on 2 prior similar admissions and with rigors this seems a likely scenario now, but with normal UA this is hard to fathom -Blood and urine cultures pending -Influenza A and B negative -Will admit to Med Surg and continue to monitor -Treat with IV Vanc and Zosyn as per order set -Will add HIV -Will trend lactate  -While this may simply be a product of his CLL, which is otherwise asymptomatic, this is his third hospitalization for FUO and may warrant more intensive evaluation.  Would consider oncology consultation as well as ID. -His family would like very much to know if there is anything they can do to help stave this off in the future. -Will treat rigors with Demerol, but otherwise pain with morphine.  CLL -As above, consider onc consult.  AKI on CKD -Creatinine 3.77  (baseline about 3.3) -Will rehydrate with IVF bolus per sepsis protocol and ongoing IV hydration for now and recheck in AM.  DM -Glucose 125, A1c 6.6 on 05/12/16 -This is appropriate for his age -Continue NPH -Cover with SSI   DVT prophylaxis: Lovenox Code Status: Full - confirmed with patient/family Family Communication: Wife present throughout evaluation, daughter arrived near the completion of the visit Disposition Plan:  Home once clinically improved  Consults called: None   Admission status: Admit - It is my clinical opinion that admission to INPATIENT is reasonable and necessary because this patient will require at least 2 midnights in the hospital to treat this condition based on the  medical complexity of the problems presented.  Given the aforementioned information, the predictability of an adverse outcome is felt to be significant.    Karmen Bongo MD [facility]  If 7PM-7AM, please contact night-coverage [email or web address] Password [email or web address]  08/08/2016, 11:16 PM

## 2016-08-08 NOTE — Progress Notes (Signed)
Pharmacy Antibiotic Note  Ray Merritt is a 81 y.o. male with PMH of CKD, anemia, CAD, DM, HTN, CLL who presented on 08/08/2016 with fever, emesis, and cough. Pharmacy has been consulted for Vancomycin and Zosyn dosing for sepsis.  Plan: Vancomycin 2250mg  IV x 1, then 1500mg  IV q48h. Plan for Vancomycin trough level at steady state. Goal trough level 15-20 mcg/mL. Zosyn 3.375g IV x 1 over 30 minutes, then Zosyn 3.375g IV q8h (infuse over 4 hours). Monitor renal function, cultures, clinical course.   Height: 6\' 3"  (190.5 cm) Weight: 240 lb (108.9 kg) IBW/kg (Calculated) : 84.5  Temp (24hrs), Avg:101.4 F (38.6 C), Min:99.9 F (37.7 C), Max:103 F (39.4 C)   Recent Labs Lab 08/08/16 1816 08/08/16 1825 08/08/16 2047  WBC 22.9*  --   --   CREATININE 3.77*  --   --   LATICACIDVEN  --  1.68 1.88    Estimated Creatinine Clearance: 21.5 mL/min (by C-G formula based on SCr of 3.77 mg/dL (H)).    No Known Allergies  Antimicrobials this admission: 1/19 >> Vancomycin >> 1/19 >> Zosyn >>  Dose adjustments this admission: --  Microbiology results: 1/19 BCx: sent 1/19 UCx: sent  1/19 Influenza PCR: negative  Thank you for allowing pharmacy to be a part of this patient's care.   Lindell Spar, PharmD, BCPS Pager: [phone] 08/08/2016 10:01 PM

## 2016-08-08 NOTE — ED Provider Notes (Signed)
[facility] DEPT Provider Note   CSN: [id number] Arrival date & time: 08/08/16  0086     History   Chief Complaint Chief Complaint  Patient presents with  . Fever  . Emesis    HPI Ray Merritt is a 81 y.o. male.  Patient is a 81 year old male with a history of anemia, chronic kidney disease, coronary artery disease, diabetes, hypertension and CLL who presents today with diffuse shaking and chills with new onset of cough. He felt totally fine this morning and this afternoon around 4:00 started to have the above symptoms. He has had mild productive cough but denies any shortness of breath. He has had one episode of vomiting but no abdominal pain. No diarrhea. He did receive the flu shot this year and is also had the pneumonia shot. He denies any smoking history or lung pathology.   The history is provided by the patient.  Fever   This is a new problem. The current episode started 3 to 5 hours ago. The problem occurs constantly. The problem has been gradually worsening. His temperature was unmeasured prior to arrival. Associated symptoms include vomiting and cough. Pertinent negatives include no chest pain, no diarrhea, no headaches and no sore throat. Associated symptoms comments: Shaking and chills. He has tried nothing for the symptoms. The treatment provided no relief.  Emesis   Associated symptoms include cough and a fever. Pertinent negatives include no diarrhea and no headaches.    Past Medical History:  Diagnosis Date  . Anemia   . Arthritis   . Cancer ([place])   . Chronic kidney disease   . Coronary artery disease   . Diabetes mellitus    insulin dependent   . Enlarged prostate   . Hypertension     Patient Active Problem List   Diagnosis Date Noted  . Dyspnea   . Acute kidney injury superimposed on chronic kidney disease ([place]) 04/02/2016  . SIRS (systemic inflammatory response syndrome) ([place]) 04/01/2016  . Type 2 diabetes, HbA1c goal < 7% (HCC)   . Fever  12/10/2015  . HLD (hyperlipidemia) 12/10/2015  . BPH (benign prostatic hyperplasia) 12/10/2015  . FUO (fever of unknown origin) 12/10/2015  . CAD (coronary artery disease), native coronary artery 05/21/2015  . Coronary artery disease involving coronary bypass graft 04/30/2015  . Angina pectoris associated with type 2 diabetes mellitus ([place]) 04/29/2015  . Type II diabetes mellitus, uncontrolled ([place]) 01/25/2015  . Chronic kidney disease (CKD) 01/25/2015  . Hypertension, essential, benign 01/25/2015  . Hyperlipidemia 01/25/2015  . CLL (chronic lymphocytic leukemia) ([place]) 03/02/2014  . Leukocytosis 01/27/2014    Past Surgical History:  Procedure Laterality Date  . CARDIAC CATHETERIZATION N/A 05/01/2015   Procedure: Left Heart Cath and Coronary Angiography;  Surgeon: Adrian Prows, MD;  Location: [facility];  Service: Cardiovascular;  Laterality: N/A;  . CARDIAC CATHETERIZATION  05/01/2015   Procedure: Intravascular Pressure Wire/FFR Study;  Surgeon: Adrian Prows, MD;  Location: [facility];  Service: Cardiovascular;;  . CARDIAC CATHETERIZATION N/A 05/22/2015   Procedure: Left Heart Cath and Coronary Angiography;  Surgeon: Adrian Prows, MD;  Location: [facility];  Service: Cardiovascular;  Laterality: N/A;  . CARDIAC CATHETERIZATION N/A 05/22/2015   Procedure: Coronary Stent Intervention;  Surgeon: Adrian Prows, MD;  Location: [facility];  Service: Cardiovascular;  Laterality: N/A;  . SHOULDER SURGERY         Home Medications    Prior to Admission medications   Medication Sig Start  Date End Date Taking? Authorizing Provider  ALPHAGAN P 0.1 % SOLN Place 1 drop into both eyes 2 (two) times daily. 10/17/15   Historical Provider, MD  Ascorbic Acid (VITAMIN C) 1000 MG tablet Take 1,000 mg by mouth daily.    Historical Provider, MD  aspirin 81 MG tablet Take 81 mg by mouth daily.    Historical Provider, MD  atorvastatin (LIPITOR) 10 MG tablet Take 20 mg by mouth daily.      Historical Provider, MD  carvedilol (COREG) 25 MG tablet Take 25 mg by mouth 2 (two) times daily with a meal.  03/30/16   Historical Provider, MD  Cholecalciferol (VITAMIN D) 2000 units tablet Take 2,000 Units by mouth daily.    Historical Provider, MD  cloNIDine (CATAPRES) 0.1 MG tablet Take 0.1 mg by mouth 2 (two) times daily. 02/27/16   Historical Provider, MD  diphenhydramine-acetaminophen (TYLENOL PM) 25-500 MG TABS tablet Take 2 tablets by mouth at bedtime.    Historical Provider, MD  Ferrous Sulfate (IRON) 325 (65 Fe) MG TABS Take 1 tablet by mouth daily.    Historical Provider, MD  finasteride (PROSCAR) 5 MG tablet Take 5 mg by mouth daily.    Historical Provider, MD  furosemide (LASIX) 40 MG tablet Take 1 tablet (40 mg total) by mouth daily. 04/05/16   Robbie Lis, MD  glucose blood (BAYER CONTOUR NEXT TEST) test strip Use as instructed to check blood sugar 2 times per day dx code E11.65 Patient taking differently: 1 each by Other route See admin instructions. Check blood sugar 3 times daily 01/26/15   Elayne Snare, MD  hydrALAZINE (APRESOLINE) 50 MG tablet Take 50 mg by mouth 3 (three) times daily. 04/25/15   Historical Provider, MD  insulin NPH Human (HUMULIN N,NOVOLIN N) 100 UNIT/ML injection Inject 0.15 mLs (15 Units total) into the skin 2 (two) times daily before a meal. Patient taking differently: Inject 13 Units into the skin 2 (two) times daily before a meal. 13 units in the morning and 26 units in the evening 12/13/15   Belkys A Regalado, MD  isosorbide mononitrate (IMDUR) 120 MG 24 hr tablet Take 120 mg by mouth daily.    Historical Provider, MD  levofloxacin (LEVAQUIN) 500 MG tablet Take 1 tablet (500 mg total) by mouth every other day. 04/05/16   Robbie Lis, MD  minoxidil (LONITEN) 2.5 MG tablet Take 2.5 mg by mouth daily.    Historical Provider, MD  Multiple Vitamin (MULTIVITAMIN WITH MINERALS) TABS tablet Take 1 tablet by mouth daily.    Historical Provider, MD  NOVOLIN R 100  UNIT/ML injection Inject 0.04 mLs (4 Units total) into the skin 2 (two) times daily before a meal. Patient taking differently: Inject 6-7 Units into the skin 2 (two) times daily before a meal. Take 6 units in the am and Take 7 units at bedtime 12/13/15   Belkys A Regalado, MD  Omega-3 Fatty Acids (FISH OIL) 1200 MG CAPS Take 1 capsule by mouth daily.    Historical Provider, MD  potassium chloride (K-DUR) 10 MEQ tablet Take 1 tablet (10 mEq total) by mouth daily. 04/05/16   Robbie Lis, MD  prasugrel (EFFIENT) 10 MG TABS tablet Take 1 tablet (10 mg total) by mouth daily. Patient taking differently: Take 10 mg by mouth at bedtime.  05/24/15   Neldon Labella, NP  tamsulosin (FLOMAX) 0.4 MG CAPS capsule Take 0.4 mg by mouth daily after supper.    Historical Provider, MD  Travoprost,  BAK Free, (TRAVATAN) 0.004 % SOLN ophthalmic solution Place 1 drop into both eyes at bedtime.    Historical Provider, MD  triamcinolone cream (KENALOG) 0.1 % Apply 1 application topically daily.  10/12/14   Historical Provider, MD    Family History Family History  Problem Relation Age of Onset  . Hypertension Mother   . Heart disease Sister   . Stroke Sister   . Diabetes Brother     Social History Social History  Substance Use Topics  . Smoking status: Former Research scientist (life sciences)  . Smokeless tobacco: Never Used     Comment: quit smoking in early 70's  . Alcohol use No     Allergies   Patient has no known allergies.   Review of Systems Review of Systems  Constitutional: Positive for fever.  HENT: Negative for sore throat.   Respiratory: Positive for cough.   Cardiovascular: Negative for chest pain.  Gastrointestinal: Positive for vomiting. Negative for diarrhea.  Neurological: Negative for headaches.  All other systems reviewed and are negative.    Physical Exam Updated Vital Signs BP (!) 157/47 (BP Location: Right Arm)   Pulse 78   Temp 103 F (39.4 C) (Oral)   Resp 22   Ht 6\' 3"  (1.905 m)   Wt 240  lb (108.9 kg)   SpO2 96%   BMI 30.00 kg/m   Physical Exam  Constitutional: He is oriented to person, place, and time. He appears well-developed and well-nourished. No distress.  Chills and mild rigors  HENT:  Head: Normocephalic and atraumatic.  Mouth/Throat: Mucous membranes are dry.  Eyes: Conjunctivae and EOM are normal. Pupils are equal, round, and reactive to light.  Neck: Normal range of motion. Neck supple.  Cardiovascular: Normal rate, regular rhythm and intact distal pulses.   No murmur heard. Pulmonary/Chest: Effort normal and breath sounds normal. Tachypnea noted. No respiratory distress. He has no wheezes. He has no rales.  Coughing on exam  Abdominal: Soft. He exhibits no distension. There is no tenderness. There is no rebound and no guarding.  Musculoskeletal: Normal range of motion. He exhibits no edema or tenderness.  Neurological: He is alert and oriented to person, place, and time.  Skin: Skin is warm and dry. No rash noted. No erythema.  Psychiatric: He has a normal mood and affect. His behavior is normal.  Nursing note and vitals reviewed.    ED Treatments / Results  Labs (all labs ordered are listed, but only abnormal results are displayed) Labs Reviewed  COMPREHENSIVE METABOLIC PANEL - Abnormal; Notable for the following:       Result Value   CO2 20 (*)    Glucose, Bld 125 (*)    BUN 88 (*)    Creatinine, Ser 3.77 (*)    GFR calc non Af Amer 14 (*)    GFR calc Af Amer 16 (*)    All other components within normal limits  CBC WITH DIFFERENTIAL/PLATELET - Abnormal; Notable for the following:    WBC 22.9 (*)    RBC 3.61 (*)    Hemoglobin 10.7 (*)    HCT 31.8 (*)    Neutro Abs 13.3 (*)    Lymphs Abs 8.0 (*)    Monocytes Absolute 1.4 (*)    All other components within normal limits  URINALYSIS, ROUTINE W REFLEX MICROSCOPIC - Abnormal; Notable for the following:    Color, Urine STRAW (*)    All other components within normal limits  CULTURE, BLOOD  (ROUTINE X 2)  CULTURE,  BLOOD (ROUTINE X 2)  URINE CULTURE  PROTIME-INR  INFLUENZA PANEL BY PCR (TYPE A & B)  I-STAT CG4 LACTIC ACID, ED  I-STAT CG4 LACTIC ACID, ED    EKG  EKG Interpretation None       Radiology Dg Chest 2 View  Result Date: 08/08/2016 CLINICAL DATA:  Cough, chills today EXAM: CHEST  2 VIEW COMPARISON:  04/01/2016 FINDINGS: The heart size and mediastinal contours are within normal limits. Both lungs are clear. The visualized skeletal structures are unremarkable. IMPRESSION: No active cardiopulmonary disease. Electronically Signed   By: Kathreen Devoid   On: 08/08/2016 19:09    Procedures Procedures (including critical care time)  Medications Ordered in ED Medications  morphine 4 MG/ML injection 4 mg (not administered)  0.9 %  sodium chloride infusion (not administered)  sodium chloride 0.9 % bolus 1,000 mL (not administered)    And  sodium chloride 0.9 % bolus 1,000 mL (not administered)    And  sodium chloride 0.9 % bolus 500 mL (not administered)  piperacillin-tazobactam (ZOSYN) IVPB 3.375 g (not administered)  vancomycin (VANCOCIN) IVPB 1000 mg/200 mL premix (not administered)  acetaminophen (TYLENOL) suppository 650 mg (650 mg Rectal Given 08/08/16 1804)  morphine 2 MG/ML injection 2 mg (2 mg Intravenous Given 08/08/16 2046)     Initial Impression / Assessment and Plan / ED Course  I have reviewed the triage vital signs and the nursing notes.  Pertinent labs & imaging results that were available during my care of the patient were reviewed by me and considered in my medical decision making (see chart for details).    Patient was sudden onset of flulike symptoms around 4:00 this afternoon. Upon arrival here patient's temperature was 103 with mild tachypnea but normal heart rate and mildly elevated blood pressure. Patient was coughing but denied any shortness of breath oxygen saturation 96%. He had one episode of vomiting but no reproducible abdominal  pain. Patient denied any recent urinary or bowel issues. He felt his normal self earlier today. Low suspicion that this is ACS, CHF or dissection. Feel most likely patient's symptoms are related to influenza or potential pneumonia. Lower suspicion for UTI. Sepsis workup started. Patient does meet SIRS criteria.  Patient given Tylenol and labs pending  9:09 PM Labs with new AKI of 3.77.  Lactate normal.  New leukocytosis of 22,000.  CXR and uA without acute findings.  Blood and urine cx pending.  Flu neg. Patient does not display signs of meningitis.  Patient is still weak and shaky. With extensive medical history and fever of unknown origin will admit for further care. Will discuss with hospitalist antibiotics.  Final Clinical Impressions(s) / ED Diagnoses   Final diagnoses:  Sepsis, due to unspecified organism [facility])  Fever of unknown origin    New Prescriptions New Prescriptions   No medications on file     Blanchie Dessert, MD 08/08/16 2143

## 2016-08-09 ENCOUNTER — Inpatient Hospital Stay (HOSPITAL_COMMUNITY): Payer: Medicare Other

## 2016-08-09 DIAGNOSIS — C9110 Chronic lymphocytic leukemia of B-cell type not having achieved remission: Secondary | ICD-10-CM

## 2016-08-09 DIAGNOSIS — E119 Type 2 diabetes mellitus without complications: Secondary | ICD-10-CM

## 2016-08-09 DIAGNOSIS — A419 Sepsis, unspecified organism: Principal | ICD-10-CM

## 2016-08-09 DIAGNOSIS — N179 Acute kidney failure, unspecified: Secondary | ICD-10-CM

## 2016-08-09 DIAGNOSIS — R509 Fever, unspecified: Secondary | ICD-10-CM

## 2016-08-09 DIAGNOSIS — N189 Chronic kidney disease, unspecified: Secondary | ICD-10-CM

## 2016-08-09 LAB — GLUCOSE, CAPILLARY
GLUCOSE-CAPILLARY: 153 mg/dL — AB (ref 65–99)
Glucose-Capillary: 209 mg/dL — ABNORMAL HIGH (ref 65–99)
Glucose-Capillary: 238 mg/dL — ABNORMAL HIGH (ref 65–99)
Glucose-Capillary: 261 mg/dL — ABNORMAL HIGH (ref 65–99)
Glucose-Capillary: 244 mg/dL — ABNORMAL HIGH (ref 65–99)

## 2016-08-09 LAB — BASIC METABOLIC PANEL
Anion gap: 10 (ref 5–15)
BUN: 90 mg/dL — ABNORMAL HIGH (ref 6–20)
CO2: 20 mmol/L — ABNORMAL LOW (ref 22–32)
Calcium: 8.5 mg/dL — ABNORMAL LOW (ref 8.9–10.3)
Chloride: 107 mmol/L (ref 101–111)
Creatinine, Ser: 3.32 mg/dL — ABNORMAL HIGH (ref 0.61–1.24)
GFR calc Af Amer: 19 mL/min — ABNORMAL LOW (ref >60)
GFR calc non Af Amer: 16 mL/min — ABNORMAL LOW (ref >60)
Glucose, Bld: 252 mg/dL — ABNORMAL HIGH (ref 65–99)
Potassium: 4.6 mmol/L (ref 3.5–5.1)
Sodium: 137 mmol/L (ref 135–145)

## 2016-08-09 LAB — LACTIC ACID, PLASMA
LACTIC ACID, VENOUS: 0.9 mmol/L (ref 0.5–1.9)
LACTIC ACID, VENOUS: 1.1 mmol/L (ref 0.5–1.9)

## 2016-08-09 LAB — CBC
HCT: 28.8 % — ABNORMAL LOW (ref 39.0–52.0)
Hemoglobin: 9.8 g/dL — ABNORMAL LOW (ref 13.0–17.0)
MCH: 29.6 pg (ref 26.0–34.0)
MCHC: 34 g/dL (ref 30.0–36.0)
MCV: 87 fL (ref 78.0–100.0)
PLATELETS: 199 10*3/uL (ref 150–400)
RBC: 3.31 MIL/uL — ABNORMAL LOW (ref 4.22–5.81)
RDW: 13.1 % (ref 11.5–15.5)
WBC: 26.7 10*3/uL — ABNORMAL HIGH (ref 4.0–10.5)

## 2016-08-09 LAB — PROCALCITONIN: PROCALCITONIN: 2.72 ng/mL

## 2016-08-09 LAB — HIV ANTIBODY (ROUTINE TESTING W REFLEX): HIV Screen 4th Generation wRfx: NONREACTIVE

## 2016-08-09 MED ORDER — FUROSEMIDE 40 MG PO TABS
40.0000 mg | ORAL_TABLET | Freq: Two times a day (BID) | ORAL | Status: DC
Start: 2016-08-10 — End: 2016-08-10

## 2016-08-09 NOTE — Progress Notes (Signed)
PROGRESS NOTE    Ray Merritt  NLG:[id number]  DOB: June 25, 1936  DOA: 08/08/2016 PCP: Antony Blackbird, MD  Hospital course: Ray Merritt is a 81 y.o. male with medical history significant of HTN, BPH, DM, CAD s/p stent, CKD, and CLL presenting with c/o "I was kicking in the bed." He started feeling like he was getting a cold yesterday. Took some cold medicine.  This AM, felt better.  About 4pm, he developed rigors, "those shimmers that he got" about 3 different times.  Has h/o similar in the past, his wife reports that they would get his temperature down and let him go home usually.  Now reporting that he feels worse than when he came in.  T max 103.  +cough - nonproductive.  Emesis earlier.  Pain in head.  Did have a pain run up his left arm x 1, resolved spontaneously.  Assessment & Plan:   SIRS - Pt presented with fever and rigors, blood cultures NGTD, continue IVF and IV antibiotics, no source of infection found, plan to repeat CXR in AM after hydration.  Pt clinically improving with this treatment. Influenza testing negative. Following lactic acid levels.    Dehydration - IVF hydration ordered.   Insulin Requiring DM - monitor closely, resumed home basal insulin and added sliding scale coverage.   AKI on CKD - Follow renal function panel.  Likely prerenal given dehydration.  Holding diuretics for 24 hours.    CLL - Pt will follow up with his oncologist after discharge.   DVT prophylaxis: lovenox Code Status: Full Family Communication: wife/daughter Disposition Plan: TBD  Subjective: Pt says that he is starting to feel better.   Objective: Vitals:   08/08/16 2330 08/09/16 0045 08/09/16 0600 08/09/16 0843  BP: (!) 166/62 (!) 162/57 (!) 143/51 (!) 150/50  Pulse: 88 79 60 64  Resp: 16 16 15 16   Temp:  99 F (37.2 C) 98.5 F (36.9 C) 97.8 F (36.6 C)  TempSrc:   Oral Oral  SpO2: 97% 95% 100%   Weight:      Height:        Intake/Output Summary (Last 24 hours) at  08/09/16 1412 Last data filed at 08/09/16 1253  Gross per 24 hour  Intake          4502.92 ml  Output              550 ml  Net          3952.92 ml   Filed Weights   08/08/16 1756  Weight: 108.9 kg (240 lb)    Exam:  General exam: awake, alert, NAD. Cooperative.  Respiratory system:  No increased work of breathing. Cardiovascular system: S1 & S2 heard,  Gastrointestinal system: Abdomen is nondistended, soft and nontender. Normal bowel sounds heard. Central nervous system: Alert and oriented. No focal neurological deficits. Extremities: no cyanosis.  Data Reviewed: Basic Metabolic Panel:  Recent Labs Lab 08/08/16 1816 08/09/16 0615  NA 139 137  K 4.4 4.6  CL 108 107  CO2 20* 20*  GLUCOSE 125* 252*  BUN 88* 90*  CREATININE 3.77* 3.32*  CALCIUM 9.8 8.5*   Liver Function Tests:  Recent Labs Lab 08/08/16 1816  AST 18  ALT 19  ALKPHOS 61  BILITOT 0.8  PROT 7.0  ALBUMIN 4.3   No results for input(s): LIPASE, AMYLASE in the last 168 hours. No results for input(s): AMMONIA in the last 168 hours. CBC:  Recent Labs Lab 08/08/16 1816 08/09/16 0615  WBC 22.9* 26.7*  NEUTROABS 13.3*  --   HGB 10.7* 9.8*  HCT 31.8* 28.8*  MCV 88.1 87.0  PLT 226 199   Cardiac Enzymes: No results for input(s): CKTOTAL, CKMB, CKMBINDEX, TROPONINI in the last 168 hours. CBG (last 3)   Recent Labs  08/09/16 0004 08/09/16 0818 08/09/16 1229  GLUCAP 153* 209* 238*   Recent Results (from the past 240 hour(s))  Culture, blood (Routine x 2)     Status: None (Preliminary result)   Collection Time: 08/08/16  6:19 PM  Result Value Ref Range Status   Specimen Description BLOOD LEFT ANTECUBITAL  Final   Special Requests BOTTLES DRAWN AEROBIC AND ANAEROBIC 5CC  Final   Culture   Final    NO GROWTH < 24 HOURS Performed at [facility], [place] [address], [place], [place] [address]    Report Status PENDING  Incomplete  Culture, blood (Routine x 2)     Status: None (Preliminary  result)   Collection Time: 08/08/16  6:39 PM  Result Value Ref Range Status   Specimen Description BLOOD RIGHT ANTECUBITAL  Final   Special Requests BOTTLES DRAWN AEROBIC AND ANAEROBIC 5CC  Final   Culture   Final    NO GROWTH < 24 HOURS Performed at [facility], [place] [address], [place], [place] [address]    Report Status PENDING  Incomplete     Studies: Dg Chest 2 View  Result Date: 08/08/2016 CLINICAL DATA:  Cough, chills today EXAM: CHEST  2 VIEW COMPARISON:  04/01/2016 FINDINGS: The heart size and mediastinal contours are within normal limits. Both lungs are clear. The visualized skeletal structures are unremarkable. IMPRESSION: No active cardiopulmonary disease. Electronically Signed   By: Kathreen Devoid   On: 08/08/2016 19:09   Dg Chest Port 1 View  Result Date: 08/09/2016 CLINICAL DATA:  Sepsis. EXAM: PORTABLE CHEST 1 VIEW COMPARISON:  August 08, 2016 FINDINGS: Stable cardiomegaly. The hila and mediastinum are normal. No pneumothorax. No pulmonary nodules or masses. No focal infiltrates identified. IMPRESSION: No active disease. Electronically Signed   By: Dorise Bullion III M.D   On: 08/09/2016 07:21     Scheduled Meds: . aspirin EC  81 mg Oral Daily  . atorvastatin  20 mg Oral Daily  . brimonidine  1 drop Both Eyes BID  . carvedilol  25 mg Oral BID WC  . cloNIDine  0.1 mg Oral BID  . enoxaparin (LOVENOX) injection  30 mg Subcutaneous QHS  . ferrous sulfate  325 mg Oral Daily  . finasteride  5 mg Oral Daily  . [START ON 08/10/2016] furosemide  40 mg Oral BID  . hydrALAZINE  50 mg Oral TID  . insulin aspart  0-9 Units Subcutaneous TID WC  . insulin NPH Human  10 Units Subcutaneous QAC breakfast  . insulin NPH Human  26 Units Subcutaneous QHS  . isosorbide mononitrate  120 mg Oral Daily  . latanoprost  1 drop Both Eyes QHS  . minoxidil  2.5 mg Oral Daily  . piperacillin-tazobactam (ZOSYN)  IV  3.375 g Intravenous Q8H  . sodium chloride  500 mL Intravenous Once  .  tamsulosin  0.4 mg Oral QPC supper  . [START ON 08/10/2016] vancomycin  1,500 mg Intravenous Q48H  . vitamin C  1,000 mg Oral Daily   Continuous Infusions: . sodium chloride 125 mL/hr at 08/08/16 2151    Principal Problem:   Sepsis ([place]) Active Problems:   CLL (chronic lymphocytic leukemia) ([place])  Type 2 diabetes, HbA1c goal < 7% (HCC)   Acute kidney injury superimposed on chronic kidney disease (HCC)   Fever of unknown origin  Time spent:   Irwin Brakeman, MD, FAAFP [facility] Pager [phone]  If 7PM-7AM, please contact night-coverage [email or web address] Password [email or web address] 08/09/2016, 2:12 PM    LOS: 1 day

## 2016-08-10 ENCOUNTER — Inpatient Hospital Stay (HOSPITAL_COMMUNITY): Payer: Medicare Other

## 2016-08-10 DIAGNOSIS — R6510 Systemic inflammatory response syndrome (SIRS) of non-infectious origin without acute organ dysfunction: Secondary | ICD-10-CM

## 2016-08-10 LAB — BASIC METABOLIC PANEL
ANION GAP: 9 (ref 5–15)
BUN: 75 mg/dL — ABNORMAL HIGH (ref 6–20)
CALCIUM: 8.4 mg/dL — AB (ref 8.9–10.3)
CO2: 20 mmol/L — ABNORMAL LOW (ref 22–32)
Chloride: 105 mmol/L (ref 101–111)
Creatinine, Ser: 3.26 mg/dL — ABNORMAL HIGH (ref 0.61–1.24)
GFR, EST AFRICAN AMERICAN: 19 mL/min — AB (ref >60)
GFR, EST NON AFRICAN AMERICAN: 17 mL/min — AB (ref >60)
Glucose, Bld: 146 mg/dL — ABNORMAL HIGH (ref 65–99)
POTASSIUM: 3.9 mmol/L (ref 3.5–5.1)
SODIUM: 134 mmol/L — AB (ref 135–145)

## 2016-08-10 LAB — URINE CULTURE

## 2016-08-10 LAB — GLUCOSE, CAPILLARY
Glucose-Capillary: 166 mg/dL — ABNORMAL HIGH (ref 65–99)
Glucose-Capillary: 137 mg/dL — ABNORMAL HIGH (ref 65–99)
Glucose-Capillary: 201 mg/dL — ABNORMAL HIGH (ref 65–99)

## 2016-08-10 MED ORDER — DOXYCYCLINE HYCLATE 100 MG PO CAPS: 100.0000 mg | ORAL_CAPSULE | Freq: Two times a day (BID) | ORAL | 0 refills | Status: AC

## 2016-08-10 NOTE — Progress Notes (Addendum)
Pharmacy Antibiotic Note  Ray Merritt is a 81 y.o. male with PMH of CKD, anemia, CAD, DM, HTN, CLL who presented on 08/08/2016 with fever, emesis, and cough. Pharmacy has been consulted for Vancomycin and Zosyn dosing for sepsis.  Today, 08/10/2016: - now afeb, wbc elevated - scr elevated but down 3.26 (crcl~19 N)--> scr was 3.35 in sept  2016 - PCT 2.72    Plan: - with poor renal function, will check random level tonight (~48 hrs after last dose) to assess clearance.  Will plan to re-dose if level is <20 - continue Zosyn 3.375g IV q8h (infuse over 4 hours). - Monitor renal function, cultures, clinical course.    Adden: patient to be discharged today on doxycycline.  Will hold off on checking vancomycin level ______________________________________  Height: 6\' 3"  (190.5 cm) Weight: 240 lb (108.9 kg) IBW/kg (Calculated) : 84.5  Temp (24hrs), Avg:97.9 F (36.6 C), Min:97.5 F (36.4 C), Max:98.3 F (36.8 C)   Recent Labs Lab 08/08/16 1816 08/08/16 1825 08/08/16 2047 08/09/16 0010 08/09/16 0615 08/10/16 0612  WBC 22.9*  --   --   --  26.7*  --   CREATININE 3.77*  --   --   --  3.32* 3.26*  LATICACIDVEN  --  1.68 1.88 0.9 1.1  --     Estimated Creatinine Clearance: 24.9 mL/min (by C-G formula based on SCr of 3.26 mg/dL (H)).    No Known Allergies  Antimicrobials this admission: 1/19 >> Vancomycin >> 1/19 >> Zosyn >>    Microbiology results: 1/19 BCx x2:  1/19 UCx: multi species, suggest recollection 1/19 Influenza PCR: negative 1/20 HIV antb: NR  Thank you for allowing pharmacy to be a part of this patient's care.   Dia Sitter, PharmD, BCPS 08/10/2016 12:30 PM

## 2016-08-10 NOTE — Progress Notes (Signed)
Both IV's discontinued each has a gauze dressing, Discharge instructions explained to pt, 1 prescription called in to [facility].Daughter in to take pt home, discharged via wheelchair.

## 2016-08-10 NOTE — Discharge Summary (Signed)
Physician Discharge Summary  JAHAN FRIEDLANDER ZOX:[id number] DOB: May 08, 1936 DOA: 08/08/2016  PCP: Antony Blackbird, MD  Admit date: 08/08/2016 Discharge date: 08/10/2016  Admitted From: Home  Disposition: Home   Recommendations for Outpatient Follow-up:  1. Follow up with PCP in 1 week 2. Follow up with heme/onc in 1 -2 weeks  Discharge Condition: STABLE CODE STATUS: FULL  Brief/Interim Summary: HPI: Ray Merritt is a 81 y.o. male with medical history significant of HTN, BPH, DM, CAD s/p stent, CKD, and CLL presenting with c/o "I was kicking in the bed." He started feeling like he was getting a cold yesterday. Took some cold medicine.  This AM, felt better.  About 4pm, he developed rigors, "those shimmers that he got" about 3 different times.  Has h/o similar in the past, his wife reports that they would get his temperature down and let him go home usually.  Now reporting that he feels worse than when he came in.  T max 103.  +cough - nonproductive.  Emesis earlier.  Pain in head.  Did have a pain run up his left arm x 1, resolved spontaneously.  Prior admissions were 5/22-25 for sepsis from likely urinary source and 9/12-16 for sepsis of unknown etiology but also possibly urinary.  Urine culture from both admissions showed multiple species on the initial culture and no growth on the recollection.  ED Course: Per Dr. Maryan Rued: Patient was sudden onset of flulike symptoms around 4:00 this afternoon. Upon arrival here patient's temperature was 103 with mild tachypnea but normal heart rate and mildly elevated blood pressure. Patient was coughing but denied any shortness of breath oxygen saturation 96%. He had one episode of vomiting but no reproducible abdominal pain. Patient denied any recent urinary or bowel issues. He felt his normal self earlier today. Low suspicion that this is ACS, CHF or dissection. Feel most likely patient's symptoms are related to influenza or potential pneumonia. Lower  suspicion for UTI.  Sepsis workup started. Patient does meet SIRS criteria. Patient given Tylenol and labs pending  Labs with new AKI of 3.77. Lactate normal. New leukocytosis of 22,000. CXR and uA without acute findings. Blood and urine cx pending. Flu neg. Patient does not display signs of meningitis. Patient is still weak and shaky. With extensive medical history and fever of unknown origin will admit for further care. Will discuss with hospitalist antibiotics.  SIRS - Pt presented with fever and rigors, blood cultures NGTD, continue IVF and IV antibiotics, no source of infection found, suspect he had a viral URI from recent sick contacts, responded well to supportive therapy, send home on 5 days of doxycycline.  Repeat CXR no acute findings.  Pt clinically improved and asking to go home.  Influenza testing negative.  Lactate levels normal. No more fever or chills.   Dehydration - IVF hydration ordered in hospital.   Insulin Requiring DM - monitored closely, resumed home basal insulin and added sliding scale coverage.   CBG (last 3)   Recent Labs  08/10/16 0302 08/10/16 0743 08/10/16 1141  GLUCAP 166* 137* 201*   AKI on CKD - Improved with gentle hydration.   CLL - Pt will follow up with his oncologist after discharge.   DVT prophylaxis: lovenox Code Status: Full Family Communication: wife/daughter Disposition Plan: TBD  Discharge Diagnoses:  Principal Problem:   Sepsis ([place]) Active Problems:   CLL (chronic lymphocytic leukemia) ([place])   Type 2 diabetes, HbA1c goal < 7% ([facility])   Acute kidney injury superimposed  on chronic kidney disease ([place])   Fever of unknown origin  Discharge Instructions  Discharge Instructions    Increase activity slowly    Complete by:  As directed      Allergies as of 08/10/2016   No Known Allergies     Medication List    TAKE these medications   ALPHAGAN P 0.1 % Soln Generic drug:  brimonidine Place 1 drop into both eyes 2 (two) times  daily.   aspirin 81 MG tablet Take 81 mg by mouth daily.   atorvastatin 10 MG tablet Commonly known as:  LIPITOR Take 20 mg by mouth daily.   carvedilol 25 MG tablet Commonly known as:  COREG Take 25 mg by mouth 2 (two) times daily with a meal.   cholecalciferol 1000 units tablet Commonly known as:  VITAMIN D Take 1,000 Units by mouth daily.   cloNIDine 0.1 MG tablet Commonly known as:  CATAPRES Take 0.1 mg by mouth 2 (two) times daily.   diphenhydrAMINE 25 mg capsule Commonly known as:  BENADRYL Take 50 mg by mouth at bedtime.   doxycycline 100 MG capsule Commonly known as:  VIBRAMYCIN Take 1 capsule (100 mg total) by mouth 2 (two) times daily.   finasteride 5 MG tablet Commonly known as:  PROSCAR Take 5 mg by mouth daily.   Fish Oil 1200 MG Caps Take 1 capsule by mouth daily.   furosemide 40 MG tablet Commonly known as:  LASIX Take 1 tablet (40 mg total) by mouth daily. What changed:  how much to take   glucose blood test strip Commonly known as:  BAYER CONTOUR NEXT TEST Use as instructed to check blood sugar 2 times per day dx code E11.65 What changed:  how much to take  how to take this  when to take this  additional instructions   hydrALAZINE 50 MG tablet Commonly known as:  APRESOLINE Take 50 mg by mouth 3 (three) times daily.   insulin NPH Human 100 UNIT/ML injection Commonly known as:  HUMULIN N,NOVOLIN N Inject 0.15 mLs (15 Units total) into the skin 2 (two) times daily before a meal. What changed:  how much to take  additional instructions   Iron 325 (65 Fe) MG Tabs Take 1 tablet by mouth daily.   isosorbide mononitrate 120 MG 24 hr tablet Commonly known as:  IMDUR Take 120 mg by mouth daily.   minoxidil 2.5 MG tablet Commonly known as:  LONITEN Take 2.5 mg by mouth daily.   multivitamin with minerals Tabs tablet Take 1 tablet by mouth daily.   NOVOLIN R 250 units/2.48mL (100 units/mL) injection Generic drug:  insulin  regular Inject 0.04 mLs (4 Units total) into the skin 2 (two) times daily before a meal. What changed:  how much to take  additional instructions   potassium chloride 10 MEQ tablet Commonly known as:  K-DUR Take 1 tablet (10 mEq total) by mouth daily.   tamsulosin 0.4 MG Caps capsule Commonly known as:  FLOMAX Take 0.4 mg by mouth daily after supper.   Travoprost (BAK Free) 0.004 % Soln ophthalmic solution Commonly known as:  TRAVATAN Place 1 drop into both eyes at bedtime.   triamcinolone cream 0.1 % Commonly known as:  KENALOG Apply 1 application topically daily.   vitamin C 1000 MG tablet Take 1,000 mg by mouth daily.      Follow-up Information    FULP, CAMMIE, MD. Schedule an appointment as soon as possible for a visit in 1 week(s).  Specialty:  Family Medicine Contact information: [address] [place] [address] [phone]          No Known Allergies  Procedures/Studies: Dg Chest 2 View  Result Date: 08/08/2016 CLINICAL DATA:  Cough, chills today EXAM: CHEST  2 VIEW COMPARISON:  04/01/2016 FINDINGS: The heart size and mediastinal contours are within normal limits. Both lungs are clear. The visualized skeletal structures are unremarkable. IMPRESSION: No active cardiopulmonary disease. Electronically Signed   By: Kathreen Devoid   On: 08/08/2016 19:09   Dg Chest Port 1 View  Result Date: 08/10/2016 CLINICAL DATA:  81 y/o  M; SIRS. EXAM: PORTABLE CHEST 1 VIEW COMPARISON:  08/09/2016 chest radiograph. FINDINGS: Stable borderline cardiomegaly. Aortic atherosclerosis with calcification. Both lungs are clear. The visualized skeletal structures are unremarkable. IMPRESSION: No active disease. Electronically Signed   By: Kristine Garbe M.D.   On: 08/10/2016 05:42   Dg Chest Port 1 View  Result Date: 08/09/2016 CLINICAL DATA:  Sepsis. EXAM: PORTABLE CHEST 1 VIEW COMPARISON:  August 08, 2016 FINDINGS: Stable cardiomegaly. The hila and mediastinum are  normal. No pneumothorax. No pulmonary nodules or masses. No focal infiltrates identified. IMPRESSION: No active disease. Electronically Signed   By: Dorise Bullion III M.D   On: 08/09/2016 07:21     Subjective: Pt says he feels much better.  No fever or chills, no n/v/d  Discharge Exam: Vitals:   08/09/16 2115 08/10/16 0304  BP: (!) 144/54 (!) 137/50  Pulse: 65 63  Resp: 18 18  Temp: 97.5 F (36.4 C) 98.3 F (36.8 C)   Vitals:   08/09/16 0843 08/09/16 1500 08/09/16 2115 08/10/16 0304  BP: (!) 150/50 (!) 143/51 (!) 144/54 (!) 137/50  Pulse: 64 63 65 63  Resp: 16 18 18 18   Temp: 97.8 F (36.6 C) 97.8 F (36.6 C) 97.5 F (36.4 C) 98.3 F (36.8 C)  TempSrc: Oral Oral Oral Oral  SpO2:  100% 98% 99%  Weight:      Height:       General exam: awake, alert, NAD. Cooperative.  Respiratory system:  No increased work of breathing. Cardiovascular system: S1 & S2 heard,  Gastrointestinal system: Abdomen is nondistended, soft and nontender. Normal bowel sounds heard. Central nervous system: Alert and oriented. No focal neurological deficits. Extremities: no cyanosis.  The results of significant diagnostics from this hospitalization (including imaging, microbiology, ancillary and laboratory) are listed below for reference.     Microbiology: Recent Results (from the past 240 hour(s))  Urine culture     Status: Abnormal   Collection Time: 08/08/16  6:17 PM  Result Value Ref Range Status   Specimen Description URINE, CLEAN CATCH  Final   Special Requests NONE  Final   Culture MULTIPLE SPECIES PRESENT, SUGGEST RECOLLECTION (A)  Final   Report Status 08/10/2016 FINAL  Final  Culture, blood (Routine x 2)     Status: None (Preliminary result)   Collection Time: 08/08/16  6:19 PM  Result Value Ref Range Status   Specimen Description BLOOD LEFT ANTECUBITAL  Final   Special Requests BOTTLES DRAWN AEROBIC AND ANAEROBIC 5CC  Final   Culture   Final    NO GROWTH < 24 HOURS Performed at  [facility], [place] [address], [place], [place] [address]    Report Status PENDING  Incomplete  Culture, blood (Routine x 2)     Status: None (Preliminary result)   Collection Time: 08/08/16  6:39 PM  Result Value Ref Range Status  Specimen Description BLOOD RIGHT ANTECUBITAL  Final   Special Requests BOTTLES DRAWN AEROBIC AND ANAEROBIC 5CC  Final   Culture   Final    NO GROWTH < 24 HOURS Performed at [facility], [place] [address], [place], [place] [address]    Report Status PENDING  Incomplete     Labs: BNP (last 3 results)  Recent Labs  04/01/16 2221  BNP 119.4*   Basic Metabolic Panel:  Recent Labs Lab 08/08/16 1816 08/09/16 0615 08/10/16 0612  NA 139 137 134*  K 4.4 4.6 3.9  CL 108 107 105  CO2 20* 20* 20*  GLUCOSE 125* 252* 146*  BUN 88* 90* 75*  CREATININE 3.77* 3.32* 3.26*  CALCIUM 9.8 8.5* 8.4*   Liver Function Tests:  Recent Labs Lab 08/08/16 1816  AST 18  ALT 19  ALKPHOS 61  BILITOT 0.8  PROT 7.0  ALBUMIN 4.3   No results for input(s): LIPASE, AMYLASE in the last 168 hours. No results for input(s): AMMONIA in the last 168 hours. CBC:  Recent Labs Lab 08/08/16 1816 08/09/16 0615  WBC 22.9* 26.7*  NEUTROABS 13.3*  --   HGB 10.7* 9.8*  HCT 31.8* 28.8*  MCV 88.1 87.0  PLT 226 199   Cardiac Enzymes: No results for input(s): CKTOTAL, CKMB, CKMBINDEX, TROPONINI in the last 168 hours. BNP: Invalid input(s): POCBNP CBG:  Recent Labs Lab 08/09/16 1632 08/09/16 2113 08/10/16 0302 08/10/16 0743 08/10/16 1141  GLUCAP 261* 244* 166* 137* 201*   D-Dimer No results for input(s): DDIMER in the last 72 hours. Hgb A1c No results for input(s): HGBA1C in the last 72 hours. Lipid Profile No results for input(s): CHOL, HDL, LDLCALC, TRIG, CHOLHDL, LDLDIRECT in the last 72 hours. Thyroid function studies No results for input(s): TSH, T4TOTAL, T3FREE, THYROIDAB in the last 72 hours.  Invalid input(s): FREET3 Anemia work up No  results for input(s): VITAMINB12, FOLATE, FERRITIN, TIBC, IRON, RETICCTPCT in the last 72 hours. Urinalysis    Component Value Date/Time   COLORURINE STRAW (A) 08/08/2016 1817   APPEARANCEUR CLEAR 08/08/2016 1817   LABSPEC 1.009 08/08/2016 1817   PHURINE 5.0 08/08/2016 1817   GLUCOSEU NEGATIVE 08/08/2016 1817   HGBUR NEGATIVE 08/08/2016 1817   BILIRUBINUR NEGATIVE 08/08/2016 1817   KETONESUR NEGATIVE 08/08/2016 1817   PROTEINUR NEGATIVE 08/08/2016 1817   UROBILINOGEN 0.2 10/07/2011 1203   NITRITE NEGATIVE 08/08/2016 1817   LEUKOCYTESUR NEGATIVE 08/08/2016 1817   Sepsis Labs Invalid input(s): PROCALCITONIN,  WBC,  LACTICIDVEN Microbiology Recent Results (from the past 240 hour(s))  Urine culture     Status: Abnormal   Collection Time: 08/08/16  6:17 PM  Result Value Ref Range Status   Specimen Description URINE, CLEAN CATCH  Final   Special Requests NONE  Final   Culture MULTIPLE SPECIES PRESENT, SUGGEST RECOLLECTION (A)  Final   Report Status 08/10/2016 FINAL  Final  Culture, blood (Routine x 2)     Status: None (Preliminary result)   Collection Time: 08/08/16  6:19 PM  Result Value Ref Range Status   Specimen Description BLOOD LEFT ANTECUBITAL  Final   Special Requests BOTTLES DRAWN AEROBIC AND ANAEROBIC 5CC  Final   Culture   Final    NO GROWTH < 24 HOURS Performed at [facility], [place] [address] [place], [place] [address]    Report Status PENDING  Incomplete  Culture, blood (Routine x 2)     Status: None (Preliminary result)   Collection Time: 08/08/16  6:39  PM  Result Value Ref Range Status   Specimen Description BLOOD RIGHT ANTECUBITAL  Final   Special Requests BOTTLES DRAWN AEROBIC AND ANAEROBIC 5CC  Final   Culture   Final    NO GROWTH < 24 HOURS Performed at [facility], [place] [address], [place], [place] [address]    Report Status PENDING  Incomplete   Time coordinating discharge: 27 minutes  SIGNED:  Irwin Brakeman, MD  [facility] 08/10/2016, 12:28 PM Pager   If 7PM-7AM, please contact night-coverage [email or web address] Password [email or web address]

## 2016-08-10 NOTE — Discharge Instructions (Signed)
Upper Respiratory Infection, Adult Most upper respiratory infections (URIs) are caused by a virus. A URI affects the nose, throat, and upper air passages. The most common type of URI is often called "the common cold." Follow these instructions at home:  Take medicines only as told by your doctor.  Gargle warm saltwater or take cough drops to comfort your throat as told by your doctor.  Use a warm mist humidifier or inhale steam from a shower to increase air moisture. This may make it easier to breathe.  Drink enough fluid to keep your pee (urine) clear or pale yellow.  Eat soups and other clear broths.  Have a healthy diet.  Rest as needed.  Go back to work when your fever is gone or your doctor says it is okay.  You may need to stay home longer to avoid giving your URI to others.  You can also wear a face mask and wash your hands often to prevent spread of the virus.  Use your inhaler more if you have asthma.  Do not use any tobacco products, including cigarettes, chewing tobacco, or electronic cigarettes. If you need help quitting, ask your doctor. Contact a doctor if:  You are getting worse, not better.  Your symptoms are not helped by medicine.  You have chills.  You are getting more short of breath.  You have brown or red mucus.  You have yellow or brown discharge from your nose.  You have pain in your face, especially when you bend forward.  You have a fever.  You have puffy (swollen) neck glands.  You have pain while swallowing.  You have white areas in the back of your throat. Get help right away if:  You have very bad or constant:  Headache.  Ear pain.  Pain in your forehead, behind your eyes, and over your cheekbones (sinus pain).  Chest pain.  You have long-lasting (chronic) lung disease and any of the following:  Wheezing.  Long-lasting cough.  Coughing up blood.  A change in your usual mucus.  You have a stiff neck.  You have  changes in your:  Vision.  Hearing.  Thinking.  Mood. This information is not intended to replace advice given to you by your health care provider. Make sure you discuss any questions you have with your health care provider. Document Released: 12/24/2007 Document Revised: 03/09/2016 Document Reviewed: 10/12/2013 Elsevier Interactive Patient Education  2017 [place].    Fever, Adult A fever is an increase in the bodys temperature. It is usually defined as a temperature of 100F (38C) or higher. Brief mild or moderate fevers generally have no long-term effects, and they often do not require treatment. Moderate or high fevers may make you feel uncomfortable and can sometimes be a sign of a serious illness or disease. The sweating that may occur with repeated or prolonged fever may also cause dehydration. Fever is confirmed by taking a temperature with a thermometer. A measured temperature can vary with:  Age.  Time of day.  Location of the thermometer:  Mouth (oral).  Rectum (rectal).  Ear (tympanic).  Underarm (axillary).  Forehead (temporal). Follow these instructions at home: Pay attention to any changes in your symptoms. Take these actions to help with your condition:  Take over-the counter and prescription medicines only as told by your health care provider. Follow the dosing instructions carefully.  If you were prescribed an antibiotic medicine, take it as told by your health care provider. Do not stop  taking the antibiotic even if you start to feel better.  Rest as needed.  Drink enough fluid to keep your urine clear or pale yellow. This helps to prevent dehydration.  Sponge yourself or bathe with room-temperature water to help reduce your body temperature as needed. Do not use ice water.  Do not overbundle yourself in blankets or heavy clothes. Contact a health care provider if:  You vomit.  You cannot eat or drink without vomiting.  You have  diarrhea.  You have pain when you urinate.  Your symptoms do not improve with treatment.  You develop new symptoms.  You develop excessive weakness. Get help right away if:  You have shortness of breath or have trouble breathing.  You are dizzy or you faint.  You are disoriented or confused.  You develop signs of dehydration, such as a dry mouth, decreased urination, or paleness.  You develop severe pain in your abdomen.  You have persistent vomiting or diarrhea.  You develop a skin rash.  Your symptoms suddenly get worse. This information is not intended to replace advice given to you by your health care provider. Make sure you discuss any questions you have with your health care provider. Document Released: 12/31/2000 Document Revised: 12/13/2015 Document Reviewed: 08/31/2014 Elsevier Interactive Patient Education  2017 [employer]    Dehydration, Adult Dehydration is when there is not enough fluid or water in your body. This happens when you lose more fluids than you take in. Dehydration can range from mild to very bad. It should be treated right away to keep it from getting very bad. Symptoms of mild dehydration may include:  Thirst.  Dry lips.  Slightly dry mouth.  Dry, warm skin.  Dizziness. Symptoms of moderate dehydration may include:  Very dry mouth.  Muscle cramps.  Dark pee (urine). Pee may be the color of tea.  Your body making less pee.  Your eyes making fewer tears.  Heartbeat that is uneven or faster than normal (palpitations).  Headache.  Light-headedness, especially when you stand up from sitting.  Fainting (syncope). Symptoms of very bad dehydration may include:  Changes in skin, such as:  Cold and clammy skin.  Blotchy (mottled) or pale skin.  Skin that does not quickly return to normal after being lightly pinched and let go (poor skin turgor).  Changes in body fluids, such as:  Feeling very thirsty.  Your eyes making  fewer tears.  Not sweating when body temperature is high, such as in hot weather.  Your body making very little pee.  Changes in vital signs, such as:  Weak pulse.  Pulse that is more than 100 beats a minute when you are sitting still.  Fast breathing.  Low blood pressure.  Other changes, such as:  Sunken eyes.  Cold hands and feet.  Confusion.  Lack of energy (lethargy).  Trouble waking up from sleep.  Short-term weight loss.  Unconsciousness. Follow these instructions at home:  If told by your doctor, drink an ORS:  Make an ORS by using instructions on the package.  Start by drinking small amounts, about  cup (120 mL) every 5-10 minutes.  Slowly drink more until you have had the amount that your doctor said to have.  Drink enough clear fluid to keep your pee clear or pale yellow. If you were told to drink an ORS, finish the ORS first, then start slowly drinking clear fluids. Drink fluids such as:  Water. Do not drink only water by itself. Doing  that can make the salt (sodium) level in your body get too low (hyponatremia).  Ice chips.  Fruit juice that you have added water to (diluted).  Low-calorie sports drinks.  Avoid:  Alcohol.  Drinks that have a lot of sugar. These include high-calorie sports drinks, fruit juice that does not have water added, and soda.  Caffeine.  Foods that are greasy or have a lot of fat or sugar.  Take over-the-counter and prescription medicines only as told by your doctor.  Do not take salt tablets. Doing that can make the salt level in your body get too high (hypernatremia).  Eat foods that have minerals (electrolytes). Examples include bananas, oranges, potatoes, tomatoes, and spinach.  Keep all follow-up visits as told by your doctor. This is important. Contact a doctor if:  You have belly (abdominal) pain that:  Gets worse.  Stays in one area (localizes).  You have a rash.  You have a stiff neck.  You  get angry or annoyed more easily than normal (irritability).  You are more sleepy than normal.  You have a harder time waking up than normal.  You feel:  Weak.  Dizzy.  Very thirsty.  You have peed (urinated) only a small amount of very dark pee during 6-8 hours. Get help right away if:  You have symptoms of very bad dehydration.  You cannot drink fluids without throwing up (vomiting).  Your symptoms get worse with treatment.  You have a fever.  You have a very bad headache.  You are throwing up or having watery poop (diarrhea) and it:  Gets worse.  Does not go away.  You have blood or something green (bile) in your throw-up.  You have blood in your poop (stool). This may cause poop to look black and tarry.  You have not peed in 6-8 hours.  You pass out (faint).  Your heart rate when you are sitting still is more than 100 beats a minute.  You have trouble breathing. This information is not intended to replace advice given to you by your health care provider. Make sure you discuss any questions you have with your health care provider. Document Released: 05/03/2009 Document Revised: 01/25/2016 Document Reviewed: 08/31/2015 Elsevier Interactive Patient Education  2017 [employer].

## 2016-08-12 ENCOUNTER — Other Ambulatory Visit (INDEPENDENT_AMBULATORY_CARE_PROVIDER_SITE_OTHER): Payer: Medicare Other

## 2016-08-12 DIAGNOSIS — E1165 Type 2 diabetes mellitus with hyperglycemia: Secondary | ICD-10-CM

## 2016-08-12 DIAGNOSIS — Z794 Long term (current) use of insulin: Secondary | ICD-10-CM | POA: Diagnosis not present

## 2016-08-12 DIAGNOSIS — E041 Nontoxic single thyroid nodule: Secondary | ICD-10-CM | POA: Diagnosis not present

## 2016-08-12 LAB — COMPREHENSIVE METABOLIC PANEL
ALBUMIN: 3.8 g/dL (ref 3.5–5.2)
ALT: 14 U/L (ref 0–53)
AST: 15 U/L (ref 0–37)
Alkaline Phosphatase: 53 U/L (ref 39–117)
BUN: 68 mg/dL — AB (ref 6–23)
CHLORIDE: 107 meq/L (ref 96–112)
CO2: 23 meq/L (ref 19–32)
CREATININE: 3.11 mg/dL — AB (ref 0.40–1.50)
Calcium: 9.5 mg/dL (ref 8.4–10.5)
GFR: 20.7 mL/min — ABNORMAL LOW (ref >60.00)
GLUCOSE: 157 mg/dL — AB (ref 70–99)
POTASSIUM: 4.1 meq/L (ref 3.5–5.1)
SODIUM: 138 meq/L (ref 135–145)
Total Bilirubin: 0.3 mg/dL (ref 0.2–1.2)
Total Protein: 6.6 g/dL (ref 6.0–8.3)

## 2016-08-12 LAB — HEMOGLOBIN A1C: HEMOGLOBIN A1C: 7.6 % — AB (ref 4.6–6.5)

## 2016-08-12 LAB — TSH: TSH: 2.13 u[IU]/mL (ref 0.35–4.50)

## 2016-08-12 NOTE — Progress Notes (Signed)
Acute Exacerbation of CKD stage 4

## 2016-08-13 LAB — CULTURE, BLOOD (ROUTINE X 2)
CULTURE: NO GROWTH
Culture: NO GROWTH

## 2016-08-15 ENCOUNTER — Encounter: Payer: Self-pay | Admitting: Endocrinology

## 2016-08-15 ENCOUNTER — Ambulatory Visit (INDEPENDENT_AMBULATORY_CARE_PROVIDER_SITE_OTHER): Payer: Medicare Other | Admitting: Endocrinology

## 2016-08-15 VITALS — BP 136/52 | HR 60 | Ht 75.0 in | Wt 247.0 lb

## 2016-08-15 DIAGNOSIS — E1165 Type 2 diabetes mellitus with hyperglycemia: Secondary | ICD-10-CM

## 2016-08-15 DIAGNOSIS — Z794 Long term (current) use of insulin: Secondary | ICD-10-CM

## 2016-08-15 NOTE — Patient Instructions (Signed)
Check blood sugars on waking up  2-3x weekly  Also check blood sugars about 2 hours after a meal and do this after different meals by rotation  Recommended blood sugar levels on waking up is 90-130 and about 2 hours after meal is 130-160  Please bring your blood sugar monitor to each visit, thank you

## 2016-08-15 NOTE — Progress Notes (Signed)
Patient ID: Ray Merritt, male   DOB: 1936/05/20, 81 y.o.   MRN: [id number]           Reason for Appointment: Follow-up for Type 2 Diabetes  Referring physician: Fulp  History of Present Illness:          Date of diagnosis of type 2 diabetes mellitus : 1982        Background history:   He has been on insulin since the time of diagnosis, initially his blood sugars were significantly high ; he was symptomatic with dry mouth and blurred vision. He thinks he was probably taking NPH and Regular Insulin for quite some time and before his consultation was only on NPH 3 years ago he was put on Bydureon weekly in addition to NPH.  He thinks that it initially curb his appetite but he does not think he lost any significant amount of weight.  Also he does not think his blood sugar control was much better For about a year he had been switched from Bydureon to Tanzeum weekly  His  Tanzeum was stopped because of lack of benefit A1c has been as high as 11.8 in 2/16  Recent history:   INSULIN regimen is : Novolin NPH 10 units acb and 26 units bedtime, Regular Insulin 6 units at breakfast and 7 supper   On his initial consultation because of high postprandial readings he was started on Regular Insulin before each meal   A1c is back up to 7.6, previously 6.6  Current blood sugar patterns and problems identified:  He has had intercurrent current illnesses and hospitalization which has affected his blood sugar control  Blood sugars had gone up significantly before his sort of sepsis earlier this month  More recently has been eating less and blood sugars are relatively lower including low sugars 2 days ago  Otherwise blood sugars appear to be getting back to close to normal recently  He has not changed his insulin regimen  Has tried to take his regular insulin 15-30 minutes before eating breakfast and supper  Also has not been very active lately  Recently has checked blood sugars at various  times  Other hypoglycemic drugs the patient is taking are:   none Side effects from medications have been: None  Compliance with the medical regimen: Good Hypoglycemia: with activity symptoms are weakness and feeling sweaty, treated with juice or other sweet drinks, usually can recognize blood sugars in the low 60s   Glucose monitoring:  done up to 3 times a day         Glucometer:  Contour      Blood Glucose readings by  review of his monitor  Mean values apply above for all meters except median for One Touch  PRE-MEAL Fasting Lunch Dinner Bedtime Overall  Glucose range: 57-228  68-339  98-136  103-257    Mean/median:     155     Self-care: The diet that the patient has been following is: tries to limit portions .     Typical meal intake: Breakfast is old-fashioned oatmeal, lunch is usually a sandwich.  He has mostly popcorn for snacks   Dinner time is usually 6-7              Dietician visit, most recent: none  CDE consultation: 7/16               Exercise: walking or childcare  Weight history:  Wt Readings from Last 3 Encounters:  08/15/16  247 lb (112 kg)  08/08/16 240 lb (108.9 kg)  05/15/16 246 lb (111.6 kg)    Glycemic control:   Lab Results  Component Value Date   HGBA1C 7.6 (H) 08/12/2016   HGBA1C 6.6 (H) 05/12/2016   HGBA1C 7.3 (H) 01/21/2016   Lab Results  Component Value Date   MICROALBUR 39.9 (H) 05/12/2016   LDLCALC 83 05/12/2016   CREATININE 3.11 (H) 08/12/2016      Allergies as of 08/15/2016   No Known Allergies     Medication List       Accurate as of 08/15/16  2:05 PM. Always use your most recent med list.          ALPHAGAN P 0.1 % Soln Generic drug:  brimonidine Place 1 drop into both eyes 2 (two) times daily.   aspirin 81 MG tablet Take 81 mg by mouth daily.   atorvastatin 10 MG tablet Commonly known as:  LIPITOR Take 20 mg by mouth daily.   carvedilol 25 MG tablet Commonly known as:  COREG Take 25 mg by mouth 2 (two) times  daily with a meal.   cholecalciferol 1000 units tablet Commonly known as:  VITAMIN D Take 1,000 Units by mouth daily.   cloNIDine 0.1 MG tablet Commonly known as:  CATAPRES Take 0.1 mg by mouth 2 (two) times daily.   diphenhydrAMINE 25 mg capsule Commonly known as:  BENADRYL Take 50 mg by mouth at bedtime.   doxycycline 100 MG capsule Commonly known as:  VIBRAMYCIN Take 1 capsule (100 mg total) by mouth 2 (two) times daily.   finasteride 5 MG tablet Commonly known as:  PROSCAR Take 5 mg by mouth daily.   Fish Oil 1200 MG Caps Take 1 capsule by mouth daily.   furosemide 40 MG tablet Commonly known as:  LASIX Take 1 tablet (40 mg total) by mouth daily.   glucose blood test strip Commonly known as:  BAYER CONTOUR NEXT TEST Use as instructed to check blood sugar 2 times per day dx code E11.65   hydrALAZINE 50 MG tablet Commonly known as:  APRESOLINE Take 50 mg by mouth 3 (three) times daily.   insulin NPH Human 100 UNIT/ML injection Commonly known as:  HUMULIN N,NOVOLIN N Inject 0.15 mLs (15 Units total) into the skin 2 (two) times daily before a meal.   Iron 325 (65 Fe) MG Tabs Take 1 tablet by mouth daily.   isosorbide mononitrate 120 MG 24 hr tablet Commonly known as:  IMDUR Take 120 mg by mouth daily.   minoxidil 2.5 MG tablet Commonly known as:  LONITEN Take 2.5 mg by mouth daily.   multivitamin with minerals Tabs tablet Take 1 tablet by mouth daily.   NOVOLIN R 250 units/2.41mL (100 units/mL) injection Generic drug:  insulin regular Inject 0.04 mLs (4 Units total) into the skin 2 (two) times daily before a meal.   potassium chloride 10 MEQ tablet Commonly known as:  K-DUR Take 1 tablet (10 mEq total) by mouth daily.   tamsulosin 0.4 MG Caps capsule Commonly known as:  FLOMAX Take 0.4 mg by mouth daily after supper.   Travoprost (BAK Free) 0.004 % Soln ophthalmic solution Commonly known as:  TRAVATAN Place 1 drop into both eyes at bedtime.     triamcinolone cream 0.1 % Commonly known as:  KENALOG Apply 1 application topically daily.   vitamin C 1000 MG tablet Take 1,000 mg by mouth daily.       Allergies: No Known Allergies  Past Medical History:  Diagnosis Date  . Anemia   . Arthritis   . Cancer ([place]) 2015-present   CLL  . Chronic kidney disease   . Coronary artery disease 04/2016   1 stent  . Diabetes mellitus    insulin dependent   . Enlarged prostate   . Hypertension     Past Surgical History:  Procedure Laterality Date  . CARDIAC CATHETERIZATION N/A 05/01/2015   Procedure: Left Heart Cath and Coronary Angiography;  Surgeon: Adrian Prows, MD;  Location: [facility];  Service: Cardiovascular;  Laterality: N/A;  . CARDIAC CATHETERIZATION  05/01/2015   Procedure: Intravascular Pressure Wire/FFR Study;  Surgeon: Adrian Prows, MD;  Location: [facility];  Service: Cardiovascular;;  . CARDIAC CATHETERIZATION N/A 05/22/2015   Procedure: Left Heart Cath and Coronary Angiography;  Surgeon: Adrian Prows, MD;  Location: [facility];  Service: Cardiovascular;  Laterality: N/A;  . CARDIAC CATHETERIZATION N/A 05/22/2015   Procedure: Coronary Stent Intervention;  Surgeon: Adrian Prows, MD;  Location: [facility];  Service: Cardiovascular;  Laterality: N/A;  . SHOULDER SURGERY      Family History  Problem Relation Age of Onset  . Hypertension Mother   . Heart disease Sister   . Stroke Sister   . Diabetes Brother     Social History:  reports that he has quit smoking. He has never used smokeless tobacco. He reports that he does not drink alcohol or use drugs.    Review of Systems   THYROID nodule:  He has a dominant left-sided thyroid nodule and did have a biopsy done Results as follows: BENIGN FOLLICULAR CELLS, COLLOID AND MACROPHAGES, CONSISTENT WITH  BENIGN GOITROUS NODULE   Lipid history: Has been on medication for a few years with Lipitor   Lab Results  Component Value Date   CHOL 147  05/12/2016   HDL 38.50 (L) 05/12/2016   LDLCALC 83 05/12/2016   TRIG 127.0 05/12/2016   CHOLHDL 4 05/12/2016          Eyes: Marland Kitchen  Most recent eye exam was in 3/17  Has occasional tingling in his feet  Diabetic foot exam done in 10/17  RENAL dysfunction: He is followed by nephrologist And the Creatinine level is overall stable   Lab Results  Component Value Date   CREATININE 3.11 (H) 08/12/2016   CREATININE 3.26 (H) 08/10/2016   CREATININE 3.32 (H) 08/09/2016   HYPERTENSION And edema: This is followed by cardiologist and nephrologist as well as PCP  Physical Examination:  BP (!) 136/52   Pulse 60   Ht 6\' 3"  (1.905 m)   Wt 247 lb (112 kg)   SpO2 96%   BMI 30.87 kg/m        ASSESSMENT:  Diabetes type 2, uncontrolled    See history of present illness for detailed discussion of his current management, blood sugar patterns and problems identified  Overall has worsening of his control because of intercurrent illnesses Still requiring relatively low doses of insulin Difficult to identify a pattern as blood sugars have been affected by his illnesses and recently change in appetite  The last few days blood sugars appear to be getting closer to target with low sugars only occurring when he had decreased appetite Overall still very compliant with checking her sugar and taking his insulin as directed Usually doing well on diet   HYPERTENSION:  Controlled  CKD: Stable, follow-up with nephrologist to be done  PLAN:   No change  in insulin regimen as yet  Discussed blood sugar monitoring and targets  Follow-up in 3 months again  Patient Instructions  Check blood sugars on waking up  2-3x weekly  Also check blood sugars about 2 hours after a meal and do this after different meals by rotation  Recommended blood sugar levels on waking up is 90-130 and about 2 hours after meal is 130-160  Please bring your blood sugar monitor to each visit, thank  you      [facility] 08/15/2016, 2:05 PM   Note: This office note was prepared with Dragon voice recognition system technology. Any transcriptional errors that result from this process are unintentional.

## 2016-08-22 DIAGNOSIS — A419 Sepsis, unspecified organism: Secondary | ICD-10-CM | POA: Diagnosis not present

## 2016-09-09 ENCOUNTER — Ambulatory Visit (HOSPITAL_BASED_OUTPATIENT_CLINIC_OR_DEPARTMENT_OTHER): Payer: Medicare Other | Admitting: Internal Medicine

## 2016-09-09 ENCOUNTER — Encounter: Payer: Self-pay | Admitting: Internal Medicine

## 2016-09-09 ENCOUNTER — Telehealth: Payer: Self-pay | Admitting: Internal Medicine

## 2016-09-09 ENCOUNTER — Other Ambulatory Visit (HOSPITAL_BASED_OUTPATIENT_CLINIC_OR_DEPARTMENT_OTHER): Payer: Medicare Other

## 2016-09-09 VITALS — BP 147/59 | HR 57 | Temp 97.6°F | Resp 18 | Ht 75.0 in | Wt 243.4 lb

## 2016-09-09 DIAGNOSIS — N189 Chronic kidney disease, unspecified: Secondary | ICD-10-CM | POA: Diagnosis not present

## 2016-09-09 DIAGNOSIS — C9110 Chronic lymphocytic leukemia of B-cell type not having achieved remission: Secondary | ICD-10-CM

## 2016-09-09 LAB — CBC WITH DIFFERENTIAL/PLATELET
BASO%: 0.5 % (ref 0.0–2.0)
Basophils Absolute: 0.1 10*3/uL (ref 0.0–0.1)
EOS%: 2.9 % (ref 0.0–7.0)
Eosinophils Absolute: 0.3 10*3/uL (ref 0.0–0.5)
HEMATOCRIT: 31.4 % — AB (ref 38.4–49.9)
HGB: 10.5 g/dL — ABNORMAL LOW (ref 13.0–17.1)
LYMPH#: 5.4 10*3/uL — AB (ref 0.9–3.3)
LYMPH%: 45.8 % (ref 14.0–49.0)
MCH: 30 pg (ref 27.2–33.4)
MCHC: 33.6 g/dL (ref 32.0–36.0)
MCV: 89.4 fL (ref 79.3–98.0)
MONO#: 0.8 10*3/uL (ref 0.1–0.9)
MONO%: 6.4 % (ref 0.0–14.0)
NEUT%: 44.4 % (ref 39.0–75.0)
NEUTROS ABS: 5.2 10*3/uL (ref 1.5–6.5)
PLATELETS: 203 10*3/uL (ref 140–400)
RBC: 3.51 10*6/uL — AB (ref 4.20–5.82)
RDW: 13.2 % (ref 11.0–14.6)
WBC: 11.7 10*3/uL — AB (ref 4.0–10.3)

## 2016-09-09 LAB — COMPREHENSIVE METABOLIC PANEL
ALT: 16 U/L (ref 0–55)
ANION GAP: 12 meq/L — AB (ref 3–11)
AST: 14 U/L (ref 5–34)
Albumin: 3.7 g/dL (ref 3.5–5.0)
Alkaline Phosphatase: 64 U/L (ref 40–150)
BILIRUBIN TOTAL: 0.37 mg/dL (ref 0.20–1.20)
BUN: 79.9 mg/dL — AB (ref 7.0–26.0)
CO2: 23 meq/L (ref 22–29)
CREATININE: 3.4 mg/dL — AB (ref 0.7–1.3)
Calcium: 9.9 mg/dL (ref 8.4–10.4)
Chloride: 103 mEq/L (ref 98–109)
EGFR: 16 mL/min/{1.73_m2} — ABNORMAL LOW (ref >90)
Glucose: 187 mg/dl — ABNORMAL HIGH (ref 70–140)
Potassium: 4.4 mEq/L (ref 3.5–5.1)
Sodium: 138 mEq/L (ref 136–145)
TOTAL PROTEIN: 6.8 g/dL (ref 6.4–8.3)

## 2016-09-09 LAB — LACTATE DEHYDROGENASE: LDH: 216 U/L (ref 125–245)

## 2016-09-09 NOTE — Progress Notes (Signed)
[facility] [phone]   [phone]  OFFICE PROGRESS NOTE  FULP, CAMMIE, MD [address] [address]  DIAGNOSIS: Chronic lymphocytic leukemia diagnosed in July 2015  PRIOR THERAPY: None  CURRENT THERAPY: Observation.  INTERVAL HISTORY: Ray Merritt 81 y.o. male came to the clinic today for follow-up visit. The patient is feeling fine today with no specific complaints. He denied having any chest pain, shortness of breath, cough or hemoptysis. He has no fever or chills. He denied having any significant weight loss or night sweats. He is followed by Dr. Florene Glen for his renal insufficiency. He is not on dialysis yet. The patient is here today for evaluation and repeat blood work for monitoring of his CLL.   MEDICAL HISTORY: Past Medical History:  Diagnosis Date  . Anemia   . Arthritis   . Cancer ([place]) 2015-present   CLL  . Chronic kidney disease   . Coronary artery disease 04/2016   1 stent  . Diabetes mellitus    insulin dependent   . Enlarged prostate   . Hypertension     ALLERGIES:  has No Known Allergies.  MEDICATIONS:  Current Outpatient Prescriptions  Medication Sig Dispense Refill  . ALPHAGAN P 0.1 % SOLN Place 1 drop into both eyes 2 (two) times daily.  0  . Ascorbic Acid (VITAMIN C) 1000 MG tablet Take 1,000 mg by mouth daily.    Marland Kitchen aspirin 81 MG tablet Take 81 mg by mouth daily.    Marland Kitchen atorvastatin (LIPITOR) 10 MG tablet Take 20 mg by mouth daily.     Marland Kitchen atorvastatin (LIPITOR) 20 MG tablet Take 20 mg by mouth daily.  0  . carvedilol (COREG) 25 MG tablet Take 25 mg by mouth 2 (two) times daily with a meal.   0  . cholecalciferol (VITAMIN D) 1000 units tablet Take 1,000 Units by mouth daily.     . cloNIDine (CATAPRES) 0.1 MG tablet Take 0.1 mg by mouth 2 (two) times daily.  0  . diphenhydrAMINE (BENADRYL) 25 mg capsule Take 50 mg by mouth at bedtime.    . Ferrous Sulfate (IRON) 325 (65 Fe) MG TABS Take 1 tablet  by mouth daily.    . finasteride (PROSCAR) 5 MG tablet Take 5 mg by mouth daily.    . furosemide (LASIX) 80 MG tablet Take 80 mg by mouth daily.  1  . glucose blood (BAYER CONTOUR NEXT TEST) test strip Use as instructed to check blood sugar 2 times per day dx code E11.65 (Patient taking differently: 1 each by Other route See admin instructions. Check blood sugar 3 times daily) 100 each 12  . hydrALAZINE (APRESOLINE) 50 MG tablet Take 50 mg by mouth 3 (three) times daily.  0  . insulin NPH Human (HUMULIN N,NOVOLIN N) 100 UNIT/ML injection Inject 0.15 mLs (15 Units total) into the skin 2 (two) times daily before a meal. (Patient taking differently: Inject 10-26 Units into the skin 2 (two) times daily before a meal. 10 units in the morning and 26 units in the evening) 10 mL 0  . isosorbide mononitrate (IMDUR) 120 MG 24 hr tablet Take 120 mg by mouth daily.    . minoxidil (LONITEN) 2.5 MG tablet Take 2.5 mg by mouth daily.    . Multiple Vitamin (MULTIVITAMIN WITH MINERALS) TABS tablet Take 1 tablet by mouth daily.    Marland Kitchen NOVOLIN R 100 UNIT/ML injection Inject 0.04 mLs (4 Units total) into  the skin 2 (two) times daily before a meal. (Patient taking differently: Inject 6-7 Units into the skin 2 (two) times daily before a meal. Take 6 units in the am and Take 7 units at bedtime) 10 mL 0  . Omega-3 Fatty Acids (FISH OIL) 1200 MG CAPS Take 1 capsule by mouth daily.    . tamsulosin (FLOMAX) 0.4 MG CAPS capsule Take 0.4 mg by mouth daily after supper.    . Travoprost, BAK Free, (TRAVATAN) 0.004 % SOLN ophthalmic solution Place 1 drop into both eyes at bedtime.    . triamcinolone cream (KENALOG) 0.1 % Apply 1 application topically daily.   0   No current facility-administered medications for this visit.     SURGICAL HISTORY:  Past Surgical History:  Procedure Laterality Date  . CARDIAC CATHETERIZATION N/A 05/01/2015   Procedure: Left Heart Cath and Coronary Angiography;  Surgeon: Adrian Prows, MD;  Location: [facility];  Service: Cardiovascular;  Laterality: N/A;  . CARDIAC CATHETERIZATION  05/01/2015   Procedure: Intravascular Pressure Wire/FFR Study;  Surgeon: Adrian Prows, MD;  Location: [facility];  Service: Cardiovascular;;  . CARDIAC CATHETERIZATION N/A 05/22/2015   Procedure: Left Heart Cath and Coronary Angiography;  Surgeon: Adrian Prows, MD;  Location: [facility];  Service: Cardiovascular;  Laterality: N/A;  . CARDIAC CATHETERIZATION N/A 05/22/2015   Procedure: Coronary Stent Intervention;  Surgeon: Adrian Prows, MD;  Location: [facility];  Service: Cardiovascular;  Laterality: N/A;  . SHOULDER SURGERY      REVIEW OF SYSTEMS:  A comprehensive review of systems was negative except for: Constitutional: positive for fatigue   PHYSICAL EXAMINATION: General appearance: alert, cooperative, fatigued and no distress Head: Normocephalic, without obvious abnormality, atraumatic Neck: no adenopathy, no JVD, supple, symmetrical, trachea midline and thyroid not enlarged, symmetric, no tenderness/mass/nodules Lymph nodes: Cervical, supraclavicular, and axillary nodes normal. Resp: clear to auscultation bilaterally Back: symmetric, no curvature. ROM normal. No CVA tenderness. Cardio: regular rate and rhythm, S1, S2 normal, no murmur, click, rub or gallop GI: soft, non-tender; bowel sounds normal; no masses,  no organomegaly Extremities: extremities normal, atraumatic, no cyanosis or edema  ECOG PERFORMANCE STATUS: 1 - Symptomatic but completely ambulatory  Blood pressure (!) 147/59, pulse (!) 57, temperature 97.6 F (36.4 C), temperature source Oral, resp. rate 18, height 6\' 3"  (1.905 m), weight 243 lb 6.4 oz (110.4 kg), SpO2 99 %.  LABORATORY DATA: Lab Results  Component Value Date   WBC 11.7 (H) 09/09/2016   HGB 10.5 (L) 09/09/2016   HCT 31.4 (L) 09/09/2016   MCV 89.4 09/09/2016   PLT 203 09/09/2016      Chemistry      Component Value Date/Time   NA 138 09/09/2016  1049   K 4.4 09/09/2016 1049   CL 107 08/12/2016 0933   CO2 23 09/09/2016 1049   BUN 79.9 (H) 09/09/2016 1049   CREATININE 3.4 (HH) 09/09/2016 1049      Component Value Date/Time   CALCIUM 9.9 09/09/2016 1049   ALKPHOS 64 09/09/2016 1049   AST 14 09/09/2016 1049   ALT 16 09/09/2016 1049   BILITOT 0.37 09/09/2016 1049       RADIOGRAPHIC STUDIES: No results found.  ASSESSMENT AND PLAN:  This is a very pleasant 81 years old African-American male with history of chronic lymphocytic leukemia. The patient is currently on observation. His total white blood count today is 11.7. I discussed the lab result with the patient today and recommended  for him to continue on observation with repeat CBC, comprehensive metabolic panel and LDH in 6 months. For the chronic renal insufficiency, the patient is followed by Dr. Florene Glen. He was advised to call immediately if he has any concerning symptoms in the interval. The patient voices understanding of current disease status and treatment options and is in agreement with the current care plan.  All questions were answered. The patient knows to call the clinic with any problems, questions or concerns. We can certainly see the patient much sooner if necessary. I spent 10 minutes counseling the patient face to face. The total time spent in the appointment was 15 minutes. Disclaimer: This note was dictated with voice recognition software. Similar sounding words can inadvertently be transcribed and may not be corrected upon review.

## 2016-09-09 NOTE — Telephone Encounter (Signed)
Appointments scheduled per 09/09/16 los . Patient ws given a copy of the AVS report and appointment schedule per 09/09/16 los.

## 2016-09-15 DIAGNOSIS — I10 Essential (primary) hypertension: Secondary | ICD-10-CM | POA: Diagnosis not present

## 2016-09-15 DIAGNOSIS — N2581 Secondary hyperparathyroidism of renal origin: Secondary | ICD-10-CM | POA: Diagnosis not present

## 2016-09-15 DIAGNOSIS — C9110 Chronic lymphocytic leukemia of B-cell type not having achieved remission: Secondary | ICD-10-CM | POA: Diagnosis not present

## 2016-09-15 DIAGNOSIS — N183 Chronic kidney disease, stage 3 (moderate): Secondary | ICD-10-CM | POA: Diagnosis not present

## 2016-09-15 DIAGNOSIS — E8770 Fluid overload, unspecified: Secondary | ICD-10-CM | POA: Diagnosis not present

## 2016-09-30 DIAGNOSIS — N183 Chronic kidney disease, stage 3 (moderate): Secondary | ICD-10-CM | POA: Diagnosis not present

## 2016-10-03 ENCOUNTER — Other Ambulatory Visit: Payer: Self-pay | Admitting: Endocrinology

## 2016-10-28 DIAGNOSIS — E119 Type 2 diabetes mellitus without complications: Secondary | ICD-10-CM | POA: Diagnosis not present

## 2016-11-10 ENCOUNTER — Other Ambulatory Visit (INDEPENDENT_AMBULATORY_CARE_PROVIDER_SITE_OTHER): Payer: Medicare Other

## 2016-11-10 DIAGNOSIS — Z794 Long term (current) use of insulin: Secondary | ICD-10-CM | POA: Diagnosis not present

## 2016-11-10 DIAGNOSIS — E1165 Type 2 diabetes mellitus with hyperglycemia: Secondary | ICD-10-CM | POA: Diagnosis not present

## 2016-11-10 LAB — COMPREHENSIVE METABOLIC PANEL
ALBUMIN: 4.2 g/dL (ref 3.5–5.2)
ALT: 19 U/L (ref 0–53)
AST: 20 U/L (ref 0–37)
Alkaline Phosphatase: 52 U/L (ref 39–117)
BUN: 108 mg/dL — AB (ref 6–23)
CHLORIDE: 101 meq/L (ref 96–112)
CO2: 23 meq/L (ref 19–32)
CREATININE: 3.97 mg/dL — AB (ref 0.40–1.50)
Calcium: 9.8 mg/dL (ref 8.4–10.5)
GFR: 15.61 mL/min — ABNORMAL LOW (ref >60.00)
GLUCOSE: 110 mg/dL — AB (ref 70–99)
POTASSIUM: 4.1 meq/L (ref 3.5–5.1)
SODIUM: 134 meq/L — AB (ref 135–145)
Total Bilirubin: 0.4 mg/dL (ref 0.2–1.2)
Total Protein: 6.8 g/dL (ref 6.0–8.3)

## 2016-11-10 LAB — HEMOGLOBIN A1C: HEMOGLOBIN A1C: 7.3 % — AB (ref 4.6–6.5)

## 2016-11-13 ENCOUNTER — Encounter: Payer: Self-pay | Admitting: Endocrinology

## 2016-11-13 ENCOUNTER — Ambulatory Visit (INDEPENDENT_AMBULATORY_CARE_PROVIDER_SITE_OTHER): Payer: Medicare Other | Admitting: Endocrinology

## 2016-11-13 VITALS — BP 142/76 | HR 58 | Temp 97.6°F | Ht 75.0 in | Wt 242.0 lb

## 2016-11-13 DIAGNOSIS — E1165 Type 2 diabetes mellitus with hyperglycemia: Secondary | ICD-10-CM

## 2016-11-13 DIAGNOSIS — Z794 Long term (current) use of insulin: Secondary | ICD-10-CM | POA: Diagnosis not present

## 2016-11-13 NOTE — Patient Instructions (Signed)
Snack before shopping or walking

## 2016-11-13 NOTE — Progress Notes (Signed)
Patient ID: Ray Merritt, male   DOB: 1936-07-04, 81 y.o.   MRN: [id number]           Reason for Appointment: Follow-up for Type 2 Diabetes  Referring physician: Fulp  History of Present Illness:          Date of diagnosis of type 2 diabetes mellitus : 1982        Background history:   He has been on insulin since the time of diagnosis, initially his blood sugars were significantly high ; he was symptomatic with dry mouth and blurred vision. He thinks he was probably taking NPH and Regular Insulin for quite some time and before his consultation was only on NPH 3 years ago he was put on Bydureon weekly in addition to NPH.  He thinks that it initially curb his appetite but he does not think he lost any significant amount of weight.  Also he does not think his blood sugar control was much better For about a year he had been switched from Bydureon to Tanzeum weekly  His  Tanzeum was stopped because of lack of benefit A1c has been as high as 11.8 in 2/16  Recent history:   INSULIN regimen is : Novolin NPH 10 units acb and 26 units bedtime, Regular Insulin 6 units at breakfast and 7 supper   On his initial consultation because of high postprandial readings he was started on Regular Insulin before each meal   A1c is to be better at 7.3, previously 7.6 However has been as low as 6.6  Current blood sugar patterns and problems identified:  His blood sugars have been fluctuating somewhat less but A1c is still higher than expected  Most of his HIGH blood sugars are mid day when he is not active  He says he will go shopping late morning or other activities about twice a week and this will make her sugar gets low midday and occasionally in the afternoon  He is usually not checking his sugars in the morning but these are mostly okay  Blood sugars before and after suppertime are nearly normal and below 65 only once  He did lose some weight during his hospitalization in January and has kept  it off  Other hypoglycemic drugs the patient is taking are:   none Side effects from medications have been: None  Compliance with the medical regimen: Good Hypoglycemia: with activity symptoms are weakness and feeling sweaty, treated with juice or other sweet drinks, usually can recognize blood sugars in the low 60s   Glucose monitoring:  done up to 3 times a day         Glucometer:  Contour      Blood Glucose readings by  review of his monitor  Mean values apply above for all meters except median for One Touch  PRE-MEAL Fasting Midday  Dinner Evening  Overall  Glucose range:  84-236   53-221  54-1 45  64-1 68    Mean/median: 130  143  88  114  126     Self-care: The diet that the patient has been following is: tries to limit portions .     Typical meal intake: Breakfast is old-fashioned oatmeal, lunch is usually a sandwich.  He has mostly popcorn for snacks   Dinner time is usually 6              Dietician visit, most recent: none  CDE consultation: 7/16  Exercise: Some walking, mostly when going shopping  Weight history:  Wt Readings from Last 3 Encounters:  11/13/16 242 lb (109.8 kg)  09/09/16 243 lb 6.4 oz (110.4 kg)  08/15/16 247 lb (112 kg)    Glycemic control:   Lab Results  Component Value Date   HGBA1C 7.3 (H) 11/10/2016   HGBA1C 7.6 (H) 08/12/2016   HGBA1C 6.6 (H) 05/12/2016   Lab Results  Component Value Date   MICROALBUR 39.9 (H) 05/12/2016   LDLCALC 83 05/12/2016   CREATININE 3.97 (H) 11/10/2016      Allergies as of 11/13/2016   No Known Allergies     Medication List       Accurate as of 11/13/16  2:39 PM. Always use your most recent med list.          ALPHAGAN P 0.1 % Soln Generic drug:  brimonidine Place 1 drop into both eyes 2 (two) times daily.   aspirin 81 MG tablet Take 81 mg by mouth daily.   atorvastatin 20 MG tablet Commonly known as:  LIPITOR Take 20 mg by mouth daily.   carvedilol 25 MG tablet Commonly  known as:  COREG Take 25 mg by mouth 2 (two) times daily with a meal.   cholecalciferol 1000 units tablet Commonly known as:  VITAMIN D Take 1,000 Units by mouth daily.   cloNIDine 0.1 MG tablet Commonly known as:  CATAPRES Take 0.1 mg by mouth 2 (two) times daily.   finasteride 5 MG tablet Commonly known as:  PROSCAR Take 5 mg by mouth daily.   Fish Oil 1200 MG Caps Take 1 capsule by mouth daily.   furosemide 80 MG tablet Commonly known as:  LASIX Take 80 mg by mouth daily.   glucose blood test strip Commonly known as:  BAYER CONTOUR NEXT TEST Use as instructed to check blood sugar 2 times per day dx code E11.65   hydrALAZINE 50 MG tablet Commonly known as:  APRESOLINE Take 50 mg by mouth 3 (three) times daily.   insulin NPH Human 100 UNIT/ML injection Commonly known as:  HUMULIN N,NOVOLIN N Inject 0.15 mLs (15 Units total) into the skin 2 (two) times daily before a meal.   Iron 325 (65 Fe) MG Tabs Take 1 tablet by mouth daily.   isosorbide mononitrate 120 MG 24 hr tablet Commonly known as:  IMDUR Take 120 mg by mouth daily.   minoxidil 2.5 MG tablet Commonly known as:  LONITEN Take 2.5 mg by mouth daily.   multivitamin with minerals Tabs tablet Take 1 tablet by mouth daily.   NOVOLIN R 250 units/2.62mL (100 units/mL) injection Generic drug:  insulin regular Inject 0.04 mLs (4 Units total) into the skin 2 (two) times daily before a meal.   tamsulosin 0.4 MG Caps capsule Commonly known as:  FLOMAX Take 0.4 mg by mouth daily after supper.   Travoprost (BAK Free) 0.004 % Soln ophthalmic solution Commonly known as:  TRAVATAN Place 1 drop into both eyes at bedtime.   triamcinolone cream 0.1 % Commonly known as:  KENALOG Apply 1 application topically daily.   vitamin C 1000 MG tablet Take 1,000 mg by mouth daily.       Allergies: No Known Allergies  Past Medical History:  Diagnosis Date  . Anemia   . Arthritis   . Cancer ([place]) 2015-present   CLL    . Chronic kidney disease   . Coronary artery disease 04/2016   1 stent  . Diabetes mellitus  insulin dependent   . Enlarged prostate   . Hypertension     Past Surgical History:  Procedure Laterality Date  . CARDIAC CATHETERIZATION N/A 05/01/2015   Procedure: Left Heart Cath and Coronary Angiography;  Surgeon: Adrian Prows, MD;  Location: [facility];  Service: Cardiovascular;  Laterality: N/A;  . CARDIAC CATHETERIZATION  05/01/2015   Procedure: Intravascular Pressure Wire/FFR Study;  Surgeon: Adrian Prows, MD;  Location: [facility];  Service: Cardiovascular;;  . CARDIAC CATHETERIZATION N/A 05/22/2015   Procedure: Left Heart Cath and Coronary Angiography;  Surgeon: Adrian Prows, MD;  Location: [facility];  Service: Cardiovascular;  Laterality: N/A;  . CARDIAC CATHETERIZATION N/A 05/22/2015   Procedure: Coronary Stent Intervention;  Surgeon: Adrian Prows, MD;  Location: [facility];  Service: Cardiovascular;  Laterality: N/A;  . SHOULDER SURGERY      Family History  Problem Relation Age of Onset  . Hypertension Mother   . Heart disease Sister   . Stroke Sister   . Diabetes Brother     Social History:  reports that he has quit smoking. He has never used smokeless tobacco. He reports that he does not drink alcohol or use drugs.    Review of Systems   THYROID nodule: This is a copy of the previous note: He has a dominant left-sided thyroid nodule and did have a biopsy done Results as follows: BENIGN FOLLICULAR CELLS, COLLOID AND MACROPHAGES, CONSISTENT WITH  BENIGN GOITROUS NODULE   Lipid history: Has been treated with Lipitor, followed by PCP and cardiologist   Lab Results  Component Value Date   CHOL 147 05/12/2016   HDL 38.50 (L) 05/12/2016   LDLCALC 83 05/12/2016   TRIG 127.0 05/12/2016   CHOLHDL 4 05/12/2016          Eyes: Marland Kitchen  Most recent eye exam was in 3/17   Diabetic foot exam done in 10/17  RENAL dysfunction: He is followed by  nephrologist, Creatinine appears to be getting higher He is due to see his nephrologist next month   Lab Results  Component Value Date   CREATININE 3.97 (H) 11/10/2016   CREATININE 3.4 (HH) 09/09/2016   CREATININE 3.11 (H) 08/12/2016    HYPERTENSION And edema: This is followed by cardiologist and nephrologist as well as PCP  Physical Examination:  BP (!) 142/76 (BP Location: Left Arm, Patient Position: Sitting, Cuff Size: Normal)   Pulse (!) 58   Temp 97.6 F (36.4 C) (Oral)   Ht 6\' 3"  (1.905 m)   Wt 242 lb (109.8 kg)   SpO2 96%   BMI 30.25 kg/m        ASSESSMENT:  Diabetes type 2, uncontrolled    See history of present illness for detailed discussion of his current management, blood sugar patterns and problems identified  Although his A1c is still relatively high at 7.3 his blood sugars are usually fairly good at home Only occasionally will have high readings midday when he is not as active Hypoglycemia is usually fairly when he is more active especially with going shopping and not taking a snack  Usually doing well on diet Weight has been stable recently  CKD: Stable, follow-up with nephrologist to be done  PLAN:   No change in insulin regimen as yet  Emphasized the need for a snack when he is planning to go shopping or other activities  To try and check sugars more often after breakfast  Follow-up in 3 months again  Patient  Instructions  Snack before shopping or walking     [facility] 11/13/2016, 2:39 PM   Note: This office note was prepared with Dragon voice recognition system technology. Any transcriptional errors that result from this process are unintentional.

## 2016-11-13 NOTE — Progress Notes (Signed)
Pre visit review using our clinic review tool, if applicable. No additional management support is needed unless otherwise documented below in the visit note. 

## 2016-11-19 DIAGNOSIS — N400 Enlarged prostate without lower urinary tract symptoms: Secondary | ICD-10-CM | POA: Diagnosis not present

## 2016-11-19 DIAGNOSIS — E1122 Type 2 diabetes mellitus with diabetic chronic kidney disease: Secondary | ICD-10-CM | POA: Diagnosis not present

## 2016-11-19 DIAGNOSIS — N183 Chronic kidney disease, stage 3 (moderate): Secondary | ICD-10-CM | POA: Diagnosis not present

## 2016-11-19 DIAGNOSIS — I10 Essential (primary) hypertension: Secondary | ICD-10-CM | POA: Diagnosis not present

## 2016-11-19 DIAGNOSIS — C9190 Lymphoid leukemia, unspecified not having achieved remission: Secondary | ICD-10-CM | POA: Diagnosis not present

## 2016-12-08 DIAGNOSIS — I25119 Atherosclerotic heart disease of native coronary artery with unspecified angina pectoris: Secondary | ICD-10-CM | POA: Diagnosis not present

## 2016-12-08 DIAGNOSIS — N184 Chronic kidney disease, stage 4 (severe): Secondary | ICD-10-CM | POA: Diagnosis not present

## 2016-12-08 DIAGNOSIS — E1122 Type 2 diabetes mellitus with diabetic chronic kidney disease: Secondary | ICD-10-CM | POA: Diagnosis not present

## 2016-12-08 DIAGNOSIS — E1165 Type 2 diabetes mellitus with hyperglycemia: Secondary | ICD-10-CM | POA: Diagnosis not present

## 2016-12-10 DIAGNOSIS — N2581 Secondary hyperparathyroidism of renal origin: Secondary | ICD-10-CM | POA: Diagnosis not present

## 2016-12-10 DIAGNOSIS — N183 Chronic kidney disease, stage 3 (moderate): Secondary | ICD-10-CM | POA: Diagnosis not present

## 2016-12-10 DIAGNOSIS — I10 Essential (primary) hypertension: Secondary | ICD-10-CM | POA: Diagnosis not present

## 2016-12-10 DIAGNOSIS — C9190 Lymphoid leukemia, unspecified not having achieved remission: Secondary | ICD-10-CM | POA: Diagnosis not present

## 2016-12-10 DIAGNOSIS — E8770 Fluid overload, unspecified: Secondary | ICD-10-CM | POA: Diagnosis not present

## 2016-12-19 DIAGNOSIS — E119 Type 2 diabetes mellitus without complications: Secondary | ICD-10-CM | POA: Diagnosis not present

## 2016-12-19 DIAGNOSIS — I10 Essential (primary) hypertension: Secondary | ICD-10-CM | POA: Diagnosis not present

## 2016-12-19 DIAGNOSIS — R9431 Abnormal electrocardiogram [ECG] [EKG]: Secondary | ICD-10-CM | POA: Diagnosis not present

## 2016-12-30 DIAGNOSIS — E1165 Type 2 diabetes mellitus with hyperglycemia: Secondary | ICD-10-CM | POA: Diagnosis not present

## 2016-12-30 DIAGNOSIS — I25119 Atherosclerotic heart disease of native coronary artery with unspecified angina pectoris: Secondary | ICD-10-CM | POA: Diagnosis not present

## 2016-12-30 DIAGNOSIS — N184 Chronic kidney disease, stage 4 (severe): Secondary | ICD-10-CM | POA: Diagnosis not present

## 2016-12-30 DIAGNOSIS — E1122 Type 2 diabetes mellitus with diabetic chronic kidney disease: Secondary | ICD-10-CM | POA: Diagnosis not present

## 2017-01-27 ENCOUNTER — Encounter (HOSPITAL_COMMUNITY): Payer: Self-pay | Admitting: Emergency Medicine

## 2017-01-27 ENCOUNTER — Emergency Department (HOSPITAL_COMMUNITY): Payer: Medicare Other

## 2017-01-27 ENCOUNTER — Emergency Department (HOSPITAL_COMMUNITY)
Admission: EM | Admit: 2017-01-27 | Discharge: 2017-01-27 | Disposition: A | Discharge status: Home / Self Care | Payer: Medicare Other | Attending: Emergency Medicine | Admitting: Emergency Medicine

## 2017-01-27 DIAGNOSIS — R509 Fever, unspecified: Secondary | ICD-10-CM

## 2017-01-27 DIAGNOSIS — E119 Type 2 diabetes mellitus without complications: Secondary | ICD-10-CM | POA: Insufficient documentation

## 2017-01-27 DIAGNOSIS — B349 Viral infection, unspecified: Secondary | ICD-10-CM

## 2017-01-27 DIAGNOSIS — I2510 Atherosclerotic heart disease of native coronary artery without angina pectoris: Secondary | ICD-10-CM | POA: Insufficient documentation

## 2017-01-27 DIAGNOSIS — I129 Hypertensive chronic kidney disease with stage 1 through stage 4 chronic kidney disease, or unspecified chronic kidney disease: Secondary | ICD-10-CM | POA: Diagnosis not present

## 2017-01-27 DIAGNOSIS — Z794 Long term (current) use of insulin: Secondary | ICD-10-CM | POA: Diagnosis not present

## 2017-01-27 DIAGNOSIS — Z79899 Other long term (current) drug therapy: Secondary | ICD-10-CM | POA: Insufficient documentation

## 2017-01-27 DIAGNOSIS — Z7982 Long term (current) use of aspirin: Secondary | ICD-10-CM | POA: Insufficient documentation

## 2017-01-27 DIAGNOSIS — N189 Chronic kidney disease, unspecified: Secondary | ICD-10-CM | POA: Insufficient documentation

## 2017-01-27 DIAGNOSIS — Z87891 Personal history of nicotine dependence: Secondary | ICD-10-CM | POA: Insufficient documentation

## 2017-01-27 DIAGNOSIS — Z955 Presence of coronary angioplasty implant and graft: Secondary | ICD-10-CM | POA: Diagnosis not present

## 2017-01-27 LAB — CBC WITH DIFFERENTIAL/PLATELET
BASOS PCT: 0 %
Basophils Absolute: 0 10*3/uL (ref 0.0–0.1)
EOS ABS: 0.2 10*3/uL (ref 0.0–0.7)
Eosinophils Relative: 1 %
HCT: 31.3 % — ABNORMAL LOW (ref 39.0–52.0)
HEMOGLOBIN: 10.6 g/dL — AB (ref 13.0–17.0)
LYMPHS ABS: 7.4 10*3/uL — AB (ref 0.7–4.0)
Lymphocytes Relative: 36 %
MCH: 29.8 pg (ref 26.0–34.0)
MCHC: 33.9 g/dL (ref 30.0–36.0)
MCV: 87.9 fL (ref 78.0–100.0)
MONO ABS: 1.6 10*3/uL — AB (ref 0.1–1.0)
MONOS PCT: 8 %
NEUTROS ABS: 11.3 10*3/uL — AB (ref 1.7–7.7)
NEUTROS PCT: 55 %
Platelets: 194 10*3/uL (ref 150–400)
RBC: 3.56 MIL/uL — ABNORMAL LOW (ref 4.22–5.81)
RDW: 13.3 % (ref 11.5–15.5)
WBC: 20.5 10*3/uL — ABNORMAL HIGH (ref 4.0–10.5)

## 2017-01-27 LAB — BASIC METABOLIC PANEL
Anion gap: 12 (ref 5–15)
BUN: 112 mg/dL — AB (ref 6–20)
CO2: 19 mmol/L — ABNORMAL LOW (ref 22–32)
CREATININE: 3.98 mg/dL — AB (ref 0.61–1.24)
Calcium: 9.2 mg/dL (ref 8.9–10.3)
Chloride: 107 mmol/L (ref 101–111)
GFR calc Af Amer: 15 mL/min — ABNORMAL LOW (ref >60)
GFR calc non Af Amer: 13 mL/min — ABNORMAL LOW (ref >60)
GLUCOSE: 60 mg/dL — AB (ref 65–99)
Potassium: 4.2 mmol/L (ref 3.5–5.1)
SODIUM: 138 mmol/L (ref 135–145)

## 2017-01-27 LAB — URINALYSIS, ROUTINE W REFLEX MICROSCOPIC
BILIRUBIN URINE: NEGATIVE
Glucose, UA: NEGATIVE mg/dL
Hgb urine dipstick: NEGATIVE
KETONES UR: NEGATIVE mg/dL
LEUKOCYTES UA: NEGATIVE
NITRITE: NEGATIVE
Protein, ur: NEGATIVE mg/dL
SPECIFIC GRAVITY, URINE: 1.014 (ref 1.005–1.030)
pH: 5 (ref 5.0–8.0)

## 2017-01-27 LAB — PATHOLOGIST SMEAR REVIEW

## 2017-01-27 LAB — I-STAT CG4 LACTIC ACID, ED
Lactic Acid, Venous: 1.3 mmol/L (ref 0.5–1.9)
Lactic Acid, Venous: 0.78 mmol/L (ref 0.5–1.9)

## 2017-01-27 LAB — LIPASE, BLOOD: Lipase: 29 U/L (ref 11–51)

## 2017-01-27 MED ORDER — ACETAMINOPHEN 325 MG PO TABS
650.0000 mg | ORAL_TABLET | Freq: Once | ORAL | Status: AC
  Administered 2017-01-27: 650 mg via ORAL
  Filled 2017-01-27: qty 2

## 2017-01-27 NOTE — ED Notes (Signed)
Bed: WA09 Expected date:  Expected time:  Means of arrival:  Comments: EMS 81 yo male fever,chills and body aches x 40 minutes

## 2017-01-27 NOTE — ED Triage Notes (Signed)
Per EMS pt coming from home. Pt woke up approx. 1 hour ago complaining of generalized body aches and fever.   20g R AC 149/61 HR 79  resp 20 97% RA Temp 98.0 [id number]

## 2017-01-27 NOTE — Discharge Instructions (Signed)
Take Tylenol for fever as directed. Push fluids. Return to the emergency department if you get worse or develop new symptoms of concern.

## 2017-01-27 NOTE — ED Notes (Signed)
Please call pt's daughter Mellanie with any updates (if pt admitted or discharged) [phone]

## 2017-01-27 NOTE — ED Notes (Signed)
Called pt's daughter Threasa Beards to let her know pt is up for discharge. She sts she is coming to get him.

## 2017-02-03 DIAGNOSIS — H401131 Primary open-angle glaucoma, bilateral, mild stage: Secondary | ICD-10-CM | POA: Diagnosis not present

## 2017-02-09 ENCOUNTER — Other Ambulatory Visit (INDEPENDENT_AMBULATORY_CARE_PROVIDER_SITE_OTHER): Payer: Medicare Other

## 2017-02-09 DIAGNOSIS — E1165 Type 2 diabetes mellitus with hyperglycemia: Secondary | ICD-10-CM | POA: Diagnosis not present

## 2017-02-09 DIAGNOSIS — Z794 Long term (current) use of insulin: Secondary | ICD-10-CM

## 2017-02-09 LAB — COMPREHENSIVE METABOLIC PANEL
ALBUMIN: 4 g/dL (ref 3.5–5.2)
ALT: 17 U/L (ref 0–53)
AST: 14 U/L (ref 0–37)
Alkaline Phosphatase: 52 U/L (ref 39–117)
BUN: 89 mg/dL — AB (ref 6–23)
CALCIUM: 9.9 mg/dL (ref 8.4–10.5)
CHLORIDE: 103 meq/L (ref 96–112)
CO2: 23 meq/L (ref 19–32)
CREATININE: 3.43 mg/dL — AB (ref 0.40–1.50)
GFR: 18.47 mL/min — ABNORMAL LOW (ref >60.00)
Glucose, Bld: 160 mg/dL — ABNORMAL HIGH (ref 70–99)
POTASSIUM: 4.7 meq/L (ref 3.5–5.1)
SODIUM: 134 meq/L — AB (ref 135–145)
Total Bilirubin: 0.4 mg/dL (ref 0.2–1.2)
Total Protein: 6.6 g/dL (ref 6.0–8.3)

## 2017-02-09 LAB — HEMOGLOBIN A1C: Hgb A1c MFr Bld: 7.6 % — ABNORMAL HIGH (ref 4.6–6.5)

## 2017-02-10 NOTE — ED Provider Notes (Signed)
[facility] DEPT Provider Note   CSN: [id number] Arrival date & time: 01/27/17  0155     History   Chief Complaint Chief Complaint  Patient presents with  . Generalized Body Aches  . Fever    HPI Ray Merritt is a 81 y.o. male.  Patient with a history of CLL, CAD, HTN, DM, CKD presents with fever, body aches and chills that started about an hour PTA. He reports a minimal dry cough, but no congestion, sore throat, nausea, vomiting, dysuria. No pain. He is not on treatments currently for CLL.    The history is provided by the patient. No language interpreter was used.    Past Medical History:  Diagnosis Date  . Anemia   . Arthritis   . Cancer ([place]) 2015-present   CLL  . Chronic kidney disease   . Coronary artery disease 04/2016   1 stent  . Diabetes mellitus    insulin dependent   . Enlarged prostate   . Hypertension     Patient Active Problem List   Diagnosis Date Noted  . Fever of unknown origin 08/08/2016  . Dyspnea   . Acute kidney injury superimposed on chronic kidney disease ([place]) 04/02/2016  . Sepsis ([place]) 04/01/2016  . Type 2 diabetes, HbA1c goal < 7% ([facility])   . Fever 12/10/2015  . BPH (benign prostatic hyperplasia) 12/10/2015  . CAD (coronary artery disease), native coronary artery 05/21/2015  . Coronary artery disease involving coronary bypass graft 04/30/2015  . Angina pectoris associated with type 2 diabetes mellitus ([place]) 04/29/2015  . Type II diabetes mellitus, uncontrolled ([place]) 01/25/2015  . Chronic kidney disease (CKD) 01/25/2015  . Hypertension, essential, benign 01/25/2015  . Hyperlipidemia 01/25/2015  . CLL (chronic lymphocytic leukemia) ([place]) 03/02/2014  . Leukocytosis 01/27/2014    Past Surgical History:  Procedure Laterality Date  . CARDIAC CATHETERIZATION N/A 05/01/2015   Procedure: Left Heart Cath and Coronary Angiography;  Surgeon: Adrian Prows, MD;  Location: [facility];  Service: Cardiovascular;  Laterality: N/A;  .  CARDIAC CATHETERIZATION  05/01/2015   Procedure: Intravascular Pressure Wire/FFR Study;  Surgeon: Adrian Prows, MD;  Location: [facility];  Service: Cardiovascular;;  . CARDIAC CATHETERIZATION N/A 05/22/2015   Procedure: Left Heart Cath and Coronary Angiography;  Surgeon: Adrian Prows, MD;  Location: [facility];  Service: Cardiovascular;  Laterality: N/A;  . CARDIAC CATHETERIZATION N/A 05/22/2015   Procedure: Coronary Stent Intervention;  Surgeon: Adrian Prows, MD;  Location: [facility];  Service: Cardiovascular;  Laterality: N/A;  . SHOULDER SURGERY         Home Medications    Prior to Admission medications   Medication Sig Start Date End Date Taking? Authorizing Provider  ALPHAGAN P 0.1 % SOLN Place 1 drop into both eyes 2 (two) times daily.   Yes [provider]  Ascorbic Acid (VITAMIN C) 1000 MG tablet Take 1,000 mg by mouth daily.   Yes [provider]  aspirin EC 81 MG tablet Take 81 mg by mouth daily.   Yes [provider]  atorvastatin (LIPITOR) 20 MG tablet Take 20 mg by mouth daily.   Yes [provider]  carvedilol (COREG) 25 MG tablet Take 25 mg by mouth 2 (two) times daily with a meal.    Yes [provider]  cholecalciferol (VITAMIN D) 1000 units tablet Take 1,000 Units by mouth daily.    Yes [provider]  cloNIDine (CATAPRES) 0.1 MG tablet Take 0.1 mg  by mouth 2 (two) times daily.   Yes [provider]  ferrous sulfate 325 (65 FE) MG tablet Take 325 mg by mouth daily with breakfast.   Yes [provider]  finasteride (PROSCAR) 5 MG tablet Take 5 mg by mouth daily.   Yes [provider]  furosemide (LASIX) 80 MG tablet Take 80 mg by mouth daily.   Yes [provider]  hydrALAZINE (APRESOLINE) 50 MG tablet Take 50 mg by mouth 3 (three) times daily.   Yes [provider]  insulin NPH Human (HUMULIN N,NOVOLIN N) 100 UNIT/ML injection Inject 10-26 Units into the skin  2 (two) times daily. Pt uses 10 units in the morning and 26 units in the evening.   Yes [provider]  insulin regular (NOVOLIN R,HUMULIN R) 100 units/mL injection Inject 6-7 Units into the skin 2 (two) times daily. Pt uses 6 units in the morning and 7 units at bedtime.   Yes [provider]  isosorbide mononitrate (IMDUR) 120 MG 24 hr tablet Take 120 mg by mouth daily.   Yes [provider]  minoxidil (LONITEN) 2.5 MG tablet Take 2.5 mg by mouth daily.   Yes [provider]  Multiple Vitamin (MULTIVITAMIN WITH MINERALS) TABS tablet Take 1 tablet by mouth daily.   Yes [provider]  omega-3 acid ethyl esters (LOVAZA) 1 g capsule Take 1 g by mouth daily.   Yes [provider]  tamsulosin (FLOMAX) 0.4 MG CAPS capsule Take 0.4 mg by mouth daily after supper.    Yes [provider]  Travoprost, BAK Free, (TRAVATAN) 0.004 % SOLN ophthalmic solution Place 1 drop into both eyes at bedtime.   Yes [provider]  triamcinolone cream (KENALOG) 0.1 % Apply 1 application topically 2 (two) times daily as needed (for itching/irritation).    Yes [provider]    Family History Family History  Problem Relation Age of Onset  . Hypertension Mother   . Heart disease Sister   . Stroke Sister   . Diabetes Brother     Social History Social History  Substance Use Topics  . Smoking status: Former Research scientist (life sciences)  . Smokeless tobacco: Never Used     Comment: quit smoking in early 70's  . Alcohol use No     Allergies   Patient has no known allergies.   Review of Systems Review of Systems  Constitutional: Negative for chills and fever.  HENT: Negative.  Negative for congestion, sinus pressure and sore throat.   Respiratory: Positive for cough. Negative for shortness of breath.   Cardiovascular: Negative.   Gastrointestinal: Negative.  Negative for abdominal pain, diarrhea, nausea and vomiting.  Musculoskeletal: Positive for  myalgias. Negative for neck stiffness.  Skin: Negative.   Neurological: Negative.  Negative for weakness and headaches.     Physical Exam Updated Vital Signs BP (!) 146/55   Pulse 80   Temp (!) 100.8 F (38.2 C) (Rectal)   Resp 19   SpO2 95%   Physical Exam  Constitutional: He is oriented to person, place, and time. He appears well-developed and well-nourished. No distress.  HENT:  Head: Normocephalic.  Mouth/Throat: Oropharynx is clear and moist.  Eyes: Conjunctivae are normal.  Neck: Normal range of motion. Neck supple.  Cardiovascular: Normal rate and regular rhythm.   Pulmonary/Chest: Effort normal and breath sounds normal. He has no wheezes. He has no rales. He exhibits no tenderness.  Abdominal: Soft. Bowel sounds are normal. There is no tenderness. There  is no rebound and no guarding.  Musculoskeletal: Normal range of motion. He exhibits no edema.  Neurological: He is alert and oriented to person, place, and time.  Skin: Skin is warm and dry. No rash noted.  Psychiatric: He has a normal mood and affect.     ED Treatments / Results  Labs (all labs ordered are listed, but only abnormal results are displayed) Labs Reviewed  CBC WITH DIFFERENTIAL/PLATELET - Abnormal; Notable for the following:       Result Value   WBC 20.5 (*)    RBC 3.56 (*)    Hemoglobin 10.6 (*)    HCT 31.3 (*)    Neutro Abs 11.3 (*)    Lymphs Abs 7.4 (*)    Monocytes Absolute 1.6 (*)    All other components within normal limits  BASIC METABOLIC PANEL - Abnormal; Notable for the following:    CO2 19 (*)    Glucose, Bld 60 (*)    BUN 112 (*)    Creatinine, Ser 3.98 (*)    GFR calc non Af Amer 13 (*)    GFR calc Af Amer 15 (*)    All other components within normal limits  LIPASE, BLOOD  URINALYSIS, ROUTINE W REFLEX MICROSCOPIC  PATHOLOGIST SMEAR REVIEW  I-STAT CG4 LACTIC ACID, ED  I-STAT CG4 LACTIC ACID, ED    EKG  EKG Interpretation None       Radiology No results  found.  Procedures Procedures (including critical care time)  Medications Ordered in ED Medications  acetaminophen (TYLENOL) tablet 650 mg (650 mg Oral Given 01/27/17 0600)     Initial Impression / Assessment and Plan / ED Course  I have reviewed the triage vital signs and the nursing notes.  Pertinent labs & imaging results that were available during my care of the patient were reviewed by me and considered in my medical decision making (see chart for details).     Patient presents with sudden onset fever, body aches and chills just prior to arrival. History of CLL not currently on chemotherapy. No other symptoms.   Labs are reassuring. CXR does not show pneumonia. He has a leukocytosis of 20, however, all other lab results are normal or at baseline. He has had elevated WBCs in the past. This is felt likely secondary to history of CLL. Lactic acid normal at 1.30.   The patient is examined by Dr. Florina Ou. He is felt stable for discharge home with close PCP follow up.   Final Clinical Impressions(s) / ED Diagnoses   Final diagnoses:  Viral syndrome  Fever, unspecified fever cause    New Prescriptions Discharge Medication List as of 01/27/2017  7:04 AM       Charlann Lange, PA-C 02/10/17 0115    Molpus, Jenny Reichmann, MD 02/11/17 2238

## 2017-02-12 ENCOUNTER — Encounter: Payer: Self-pay | Admitting: Endocrinology

## 2017-02-12 ENCOUNTER — Ambulatory Visit (INDEPENDENT_AMBULATORY_CARE_PROVIDER_SITE_OTHER): Payer: Medicare Other | Admitting: Endocrinology

## 2017-02-12 VITALS — BP 138/62 | HR 55 | Ht 75.0 in | Wt 233.0 lb

## 2017-02-12 DIAGNOSIS — E782 Mixed hyperlipidemia: Secondary | ICD-10-CM

## 2017-02-12 DIAGNOSIS — Z794 Long term (current) use of insulin: Secondary | ICD-10-CM | POA: Diagnosis not present

## 2017-02-12 DIAGNOSIS — E1165 Type 2 diabetes mellitus with hyperglycemia: Secondary | ICD-10-CM | POA: Diagnosis not present

## 2017-02-12 DIAGNOSIS — E041 Nontoxic single thyroid nodule: Secondary | ICD-10-CM

## 2017-02-12 NOTE — Patient Instructions (Addendum)
Novolin NPH 6 units and  Regular Insulin 6 units at breakfast when going shopping  Add cheese/egg in am  Stop Fish oil  Bedtime 27 [facility] [employer] [facility]  More tests in am

## 2017-02-12 NOTE — Progress Notes (Signed)
Patient ID: Ray Merritt, male   DOB: 19-Feb-1936, 81 y.o.   MRN: [id number]           Reason for Appointment: Follow-up for Type 2 Diabetes  Referring physician: Fulp  History of Present Illness:          Date of diagnosis of type 2 diabetes mellitus : 1982        Background history:   He has been on insulin since the time of diagnosis, initially his blood sugars were significantly high ; he was symptomatic with dry mouth and blurred vision. He thinks he was probably taking NPH and Regular Insulin for quite some time and before his consultation was only on NPH 3 years ago he was put on Bydureon weekly in addition to NPH.  He thinks that it initially curb his appetite but he does not think he lost any significant amount of weight.  Also he does not think his blood sugar control was much better For about a year he had been switched from Bydureon to Tanzeum weekly  His  Tanzeum was stopped because of lack of benefit A1c has been as high as 11.8 in 2/16  Recent history:   INSULIN regimen is : Novolin NPH 10 units acb and 26 units bedtime, Regular Insulin 6 units at breakfast and 7 supper   On his initial consultation because of high postprandial readings he was started on Regular Insulin before each meal   A1c is back up to 7.6, previously was 7.3 However has been as low as 6.6 in the past  Current blood sugar patterns and problems identified:  His blood sugars have been monitored mostly before lunch and after supper  Not clear why his A1c is relatively high because his average blood sugar at home is only about 140  FASTING readings are being checked very sporadically and these are mildly high, was 160 in the lab which is higher than at home  He tends to have periodic low blood sugars before lunchtime now he says that this is mostly on the days that he is going shopping around 11 AM and he may feel his blood sugar going down.  He was told to take a snack before going shopping but  he does not remember to do this  Otherwise he is not very active and blood sugars maybe as high as 248  For breakfast he is only eating oatmeal without any protein  He has lost some weight since April  Blood sugars are overall excellent after supper with only a couple of significantly high readings  Other hypoglycemic drugs the patient is taking are:   none Side effects from medications have been: None  Compliance with the medical regimen: Good Hypoglycemia: with activity symptoms are weakness and feeling sweaty, treated with juice or other sweet drinks, usually can recognize blood sugars in the low 60s   Glucose monitoring:  done up to 3 times a day         Glucometer:  Contour      Blood Glucose readings by  review of his monitor  Mean values apply above for all meters except median for One Touch  PRE-MEAL Fasting Lunch Dinner Bedtime Overall  Glucose range: 126-141  57-248      Mean/median:  129    141   POST-MEAL PC Breakfast PC Lunch PC Dinner  Glucose range:   92-229   Mean/median:   146   Self-care: The diet that the patient has been  following is: tries to limit portions .     Typical meal intake: Breakfast is old-fashioned oatmeal, lunch is usually a sandwich.  He has mostly popcorn for snacks   Dinner time is usually 6              Dietician visit, most recent: none  CDE consultation: 7/16               Exercise: Some walking, mostly when going shopping  Weight history:  Wt Readings from Last 3 Encounters:  02/12/17 233 lb (105.7 kg)  11/13/16 242 lb (109.8 kg)  09/09/16 243 lb 6.4 oz (110.4 kg)    Glycemic control:   Lab Results  Component Value Date   HGBA1C 7.6 (H) 02/09/2017   HGBA1C 7.3 (H) 11/10/2016   HGBA1C 7.6 (H) 08/12/2016   Lab Results  Component Value Date   MICROALBUR 39.9 (H) 05/12/2016   LDLCALC 83 05/12/2016   CREATININE 3.43 (H) 02/09/2017      Allergies as of 02/12/2017   No Known Allergies     Medication List         Accurate as of 02/12/17  4:57 PM. Always use your most recent med list.          ALPHAGAN P 0.1 % Soln Generic drug:  brimonidine Place 1 drop into both eyes 2 (two) times daily.   aspirin EC 81 MG tablet Take 81 mg by mouth daily.   atorvastatin 20 MG tablet Commonly known as:  LIPITOR Take 20 mg by mouth daily.   carvedilol 25 MG tablet Commonly known as:  COREG Take 25 mg by mouth 2 (two) times daily with a meal.   cholecalciferol 1000 units tablet Commonly known as:  VITAMIN D Take 1,000 Units by mouth daily.   cloNIDine 0.1 MG tablet Commonly known as:  CATAPRES Take 0.1 mg by mouth 2 (two) times daily.   ferrous sulfate 325 (65 FE) MG tablet Take 325 mg by mouth daily with breakfast.   finasteride 5 MG tablet Commonly known as:  PROSCAR Take 5 mg by mouth daily.   furosemide 80 MG tablet Commonly known as:  LASIX Take 80 mg by mouth daily.   hydrALAZINE 50 MG tablet Commonly known as:  APRESOLINE Take 50 mg by mouth 3 (three) times daily.   insulin NPH Human 100 UNIT/ML injection Commonly known as:  HUMULIN N,NOVOLIN N Inject 10-26 Units into the skin 2 (two) times daily. Pt uses 10 units in the morning and 26 units in the evening.   insulin regular 100 units/mL injection Commonly known as:  NOVOLIN R,HUMULIN R Inject 6-7 Units into the skin 2 (two) times daily. Pt uses 6 units in the morning and 7 units at bedtime.   isosorbide mononitrate 120 MG 24 hr tablet Commonly known as:  IMDUR Take 120 mg by mouth daily.   multivitamin with minerals Tabs tablet Take 1 tablet by mouth daily.   omega-3 acid ethyl esters 1 g capsule Commonly known as:  LOVAZA Take 1 g by mouth daily.   tamsulosin 0.4 MG Caps capsule Commonly known as:  FLOMAX Take 0.4 mg by mouth daily after supper.   Travoprost (BAK Free) 0.004 % Soln ophthalmic solution Commonly known as:  TRAVATAN Place 1 drop into both eyes at bedtime.   triamcinolone cream 0.1 % Commonly known  as:  KENALOG Apply 1 application topically 2 (two) times daily as needed (for itching/irritation).   vitamin C 1000 MG tablet Take  1,000 mg by mouth daily.       Allergies: No Known Allergies  Past Medical History:  Diagnosis Date  . Anemia   . Arthritis   . Cancer ([place]) 2015-present   CLL  . Chronic kidney disease   . Coronary artery disease 04/2016   1 stent  . Diabetes mellitus    insulin dependent   . Enlarged prostate   . Hypertension     Past Surgical History:  Procedure Laterality Date  . CARDIAC CATHETERIZATION N/A 05/01/2015   Procedure: Left Heart Cath and Coronary Angiography;  Surgeon: Adrian Prows, MD;  Location: [facility] [facility];  Service: Cardiovascular;  Laterality: N/A;  . CARDIAC CATHETERIZATION  05/01/2015   Procedure: Intravascular Pressure Wire/FFR Study;  Surgeon: Adrian Prows, MD;  Location: [place] [facility];  Service: Cardiovascular;;  . CARDIAC CATHETERIZATION N/A 05/22/2015   Procedure: Left Heart Cath and Coronary Angiography;  Surgeon: Adrian Prows, MD;  Location: [facility];  Service: Cardiovascular;  Laterality: N/A;  . CARDIAC CATHETERIZATION N/A 05/22/2015   Procedure: Coronary Stent Intervention;  Surgeon: Adrian Prows, MD;  Location: [facility];  Service: Cardiovascular;  Laterality: N/A;  . SHOULDER SURGERY      Family History  Problem Relation Age of Onset  . Hypertension Mother   . Heart disease Sister   . Stroke Sister   . Diabetes Brother     Social History:  reports that he has quit smoking. He has never used smokeless tobacco. He reports that he does not drink alcohol or use drugs.    Review of Systems   THYROID nodule: This is a copy of the previous note:  He has a dominant left-sided thyroid nodule and did have a biopsy done Results as follows: BENIGN FOLLICULAR CELLS, COLLOID AND MACROPHAGES, CONSISTENT WITH  BENIGN GOITROUS NODULE  Last TSH was 2.1   Lipid history: Has been treated with Lipitor, followed  by PCP and cardiologist Also on Lovaza 1 g daily   Lab Results  Component Value Date   CHOL 147 05/12/2016   HDL 38.50 (L) 05/12/2016   LDLCALC 83 05/12/2016   TRIG 127.0 05/12/2016   CHOLHDL 4 05/12/2016          Eyes: Marland Kitchen  Most recent eye exam was in 10/2016 and he is going regularly   Diabetic foot exam done in 10/17  RENAL dysfunction: He is followed by nephrologist, Creatinine appears to be a little better His nephrologist wants him to have fistula   Lab Results  Component Value Date   CREATININE 3.43 (H) 02/09/2017   CREATININE 3.98 (H) 01/27/2017   CREATININE 3.97 (H) 11/10/2016     Physical Examination:  BP 138/62   Pulse (!) 55   Ht 6\' 3"  (1.905 m)   Wt 233 lb (105.7 kg)   SpO2 98%   PF (!) 9 L/min   BMI 29.12 kg/m        ASSESSMENT:  Diabetes type 2, uncontrolled    See history of present illness for detailed discussion of his current management, blood sugar patterns and problems identified  Although his A1c is still relatively high at 7.6 his home blood sugars averaging about 140 Only occasionally has sporadic readings over 180 May be having somewhat higher fasting readings at times but he is not monitoring these Also at times may have rebound hyperglycemia after getting low before lunch His low sugars are tending to be mostly when he is out shopping and more  active than usual Also not getting protein at breakfast usually  Usually doing well on diet and has lost some weight  CKD: Stable/improved, follow-up with nephrologist regularly  LIPIDS: He has had fairly good lipids and discussed that he probably does not need to take Lovaza 1 g daily since his triglycerides are excellent and this is a subtherapeutic dose  HYPERTENSION: Appears well-controlled  PLAN:   No change in basic insulin regimen   On his days that he is going shopping he will take only 6 units of NPH in the morning instead of 10  He will add a protein at breakfast and given him  examples of this  Check more readings after waking up and at least increase his bedtime NPH by 1 unit for now, may need to increase to 28 also if needed as yet  To try and check sugars more often after lunch  Follow-up in 3 months again  Patient Instructions  Novolin NPH 6 units and  Regular Insulin 6 units at breakfast when going shopping  Add cheese/egg in am  Stop Fish oil  Bedtime 27 [facility] [employer] [facility]  More tests in am  Counseling time on subjects discussed in assessment and plan sections is over 50% of today's 25 minute visit    Korayma Hagwood 02/12/2017, 4:57 PM   Note: This office note was prepared with Dragon voice recognition system technology. Any transcriptional errors that result from this process are unintentional.

## 2017-03-06 DIAGNOSIS — I10 Essential (primary) hypertension: Secondary | ICD-10-CM | POA: Diagnosis not present

## 2017-03-06 DIAGNOSIS — E8770 Fluid overload, unspecified: Secondary | ICD-10-CM | POA: Diagnosis not present

## 2017-03-06 DIAGNOSIS — C9190 Lymphoid leukemia, unspecified not having achieved remission: Secondary | ICD-10-CM | POA: Diagnosis not present

## 2017-03-06 DIAGNOSIS — N2581 Secondary hyperparathyroidism of renal origin: Secondary | ICD-10-CM | POA: Diagnosis not present

## 2017-03-06 DIAGNOSIS — N183 Chronic kidney disease, stage 3 (moderate): Secondary | ICD-10-CM | POA: Diagnosis not present

## 2017-03-06 DIAGNOSIS — Z6833 Body mass index (BMI) 33.0-33.9, adult: Secondary | ICD-10-CM | POA: Diagnosis not present

## 2017-03-06 DIAGNOSIS — N184 Chronic kidney disease, stage 4 (severe): Secondary | ICD-10-CM | POA: Diagnosis not present

## 2017-03-10 ENCOUNTER — Telehealth: Payer: Self-pay | Admitting: Oncology

## 2017-03-10 ENCOUNTER — Ambulatory Visit (HOSPITAL_BASED_OUTPATIENT_CLINIC_OR_DEPARTMENT_OTHER): Payer: Medicare Other | Admitting: Oncology

## 2017-03-10 ENCOUNTER — Encounter: Payer: Self-pay | Admitting: Oncology

## 2017-03-10 ENCOUNTER — Other Ambulatory Visit (HOSPITAL_BASED_OUTPATIENT_CLINIC_OR_DEPARTMENT_OTHER): Payer: Medicare Other

## 2017-03-10 VITALS — BP 130/52 | HR 57 | Temp 97.7°F | Resp 18 | Ht 75.0 in | Wt 232.0 lb

## 2017-03-10 DIAGNOSIS — C9110 Chronic lymphocytic leukemia of B-cell type not having achieved remission: Secondary | ICD-10-CM | POA: Diagnosis not present

## 2017-03-10 DIAGNOSIS — N189 Chronic kidney disease, unspecified: Secondary | ICD-10-CM | POA: Diagnosis not present

## 2017-03-10 LAB — COMPREHENSIVE METABOLIC PANEL
ALT: 16 U/L (ref 0–55)
AST: 13 U/L (ref 5–34)
Albumin: 3.5 g/dL (ref 3.5–5.0)
Alkaline Phosphatase: 64 U/L (ref 40–150)
Anion Gap: 9 mEq/L (ref 3–11)
BUN: 79.1 mg/dL — AB (ref 7.0–26.0)
CHLORIDE: 105 meq/L (ref 98–109)
CO2: 23 meq/L (ref 22–29)
CREATININE: 3.4 mg/dL — AB (ref 0.7–1.3)
Calcium: 9.6 mg/dL (ref 8.4–10.4)
EGFR: 16 mL/min/{1.73_m2} — ABNORMAL LOW (ref >90)
GLUCOSE: 293 mg/dL — AB (ref 70–140)
Potassium: 4.3 mEq/L (ref 3.5–5.1)
SODIUM: 137 meq/L (ref 136–145)
TOTAL PROTEIN: 6.5 g/dL (ref 6.4–8.3)
Total Bilirubin: 0.38 mg/dL (ref 0.20–1.20)

## 2017-03-10 LAB — CBC WITH DIFFERENTIAL/PLATELET
BASO%: 0.6 % (ref 0.0–2.0)
Basophils Absolute: 0.1 10*3/uL (ref 0.0–0.1)
EOS%: 2.7 % (ref 0.0–7.0)
Eosinophils Absolute: 0.3 10*3/uL (ref 0.0–0.5)
HCT: 30.4 % — ABNORMAL LOW (ref 38.4–49.9)
HGB: 10.2 g/dL — ABNORMAL LOW (ref 13.0–17.1)
LYMPH%: 46.7 % (ref 14.0–49.0)
MCH: 30.3 pg (ref 27.2–33.4)
MCHC: 33.4 g/dL (ref 32.0–36.0)
MCV: 90.6 fL (ref 79.3–98.0)
MONO#: 0.8 10*3/uL (ref 0.1–0.9)
MONO%: 7.1 % (ref 0.0–14.0)
NEUT%: 42.9 % (ref 39.0–75.0)
NEUTROS ABS: 4.8 10*3/uL (ref 1.5–6.5)
PLATELETS: 242 10*3/uL (ref 140–400)
RBC: 3.36 10*6/uL — AB (ref 4.20–5.82)
RDW: 14.1 % (ref 11.0–14.6)
WBC: 11.2 10*3/uL — AB (ref 4.0–10.3)
lymph#: 5.3 10*3/uL — ABNORMAL HIGH (ref 0.9–3.3)

## 2017-03-10 LAB — LACTATE DEHYDROGENASE: LDH: 191 U/L (ref 125–245)

## 2017-03-10 NOTE — Assessment & Plan Note (Signed)
This is a very pleasant 81 year old African-American male with history of chronic lymphocytic leukemia. The patient is currently on observation. His total white blood count today is 11.2. I discussed the lab result with the patient today and recommended for him to continue on observation with repeat CBC, comprehensive metabolic panel and LDH in 6 months. For the chronic renal insufficiency, the patient is followed by Dr. Florene Glen.

## 2017-03-10 NOTE — Progress Notes (Signed)
[facility] Follow up:    Aura Dials, MD [phone] [address] [address]   DIAGNOSIS: Cancer Staging No matching staging information was found for the patient. Chronic lymphocytic leukemia diagnosed in July 2015  SUMMARY OF ONCOLOGIC HISTORY:  No history exists.   PRIOR THERAPY: None  CURRENT THERAPY: observation  INTERVAL HISTORY: Ray Merritt 81 y.o. male returns for routine follow-up visit. The patient is feeling fine today without any specific complaints. He denies having any chest pain, shortness of breath, cough, hemoptysis. She denies any fevers or chills.Denies any severe weight loss or night sweats. Denies any lymphadenopathy. The patient continues to follow up by Dr. Florene Glen for his renal insufficiency. Patient reports to me that his renal function is stable. The patient will have a dialysis shunt placed at some point in the future should he ever need dialysis. He does not yet have an appointment for this. The patient is here for evaluation and repeat blood work for monitoring of his CLL.   Patient Active Problem List   Diagnosis Date Noted  . Fever of unknown origin 08/08/2016  . Dyspnea   . Acute kidney injury superimposed on chronic kidney disease ([place]) 04/02/2016  . Sepsis ([facility]) 04/01/2016  . Type 2 diabetes, HbA1c goal < 7% ([facility])   . Fever 12/10/2015  . BPH (benign prostatic hyperplasia) 12/10/2015  . CAD (coronary artery disease), native coronary artery 05/21/2015  . Coronary artery disease involving coronary bypass graft 04/30/2015  . Angina pectoris associated with type 2 diabetes mellitus ([place]) 04/29/2015  . Type II diabetes mellitus, uncontrolled ([facility]) 01/25/2015  . Chronic kidney disease (CKD) 01/25/2015  . Hypertension, essential, benign 01/25/2015  . Hyperlipidemia 01/25/2015  . CLL (chronic lymphocytic leukemia) ([place]) 03/02/2014  . Leukocytosis 01/27/2014    has No Known Allergies.  MEDICAL HISTORY: Past  Medical History:  Diagnosis Date  . Anemia   . Arthritis   . Cancer ([place]) 2015-present   CLL  . Chronic kidney disease   . Coronary artery disease 04/2016   1 stent  . Diabetes mellitus    insulin dependent   . Enlarged prostate   . Hypertension     SURGICAL HISTORY: Past Surgical History:  Procedure Laterality Date  . CARDIAC CATHETERIZATION N/A 05/01/2015   Procedure: Left Heart Cath and Coronary Angiography;  Surgeon: Adrian Prows, MD;  Location: [facility];  Service: Cardiovascular;  Laterality: N/A;  . CARDIAC CATHETERIZATION  05/01/2015   Procedure: Intravascular Pressure Wire/FFR Study;  Surgeon: Adrian Prows, MD;  Location: [facility];  Service: Cardiovascular;;  . CARDIAC CATHETERIZATION N/A 05/22/2015   Procedure: Left Heart Cath and Coronary Angiography;  Surgeon: Adrian Prows, MD;  Location: [facility];  Service: Cardiovascular;  Laterality: N/A;  . CARDIAC CATHETERIZATION N/A 05/22/2015   Procedure: Coronary Stent Intervention;  Surgeon: Adrian Prows, MD;  Location: [facility];  Service: Cardiovascular;  Laterality: N/A;  . SHOULDER SURGERY      SOCIAL HISTORY: Social History   Social History  . Marital status: Married    Spouse name: N/A  . Number of children: N/A  . Years of education: N/A   Occupational History  . retired    Social History Main Topics  . Smoking status: Former Research scientist (life sciences)  . Smokeless tobacco: Never Used     Comment: quit smoking in early 70's  . Alcohol use No  . Drug use: No  . Sexual activity: Not  on file   Other Topics Concern  . Not on file   Social History Narrative  . No narrative on file    FAMILY HISTORY: Family History  Problem Relation Age of Onset  . Hypertension Mother   . Heart disease Sister   . Stroke Sister   . Diabetes Brother     Review of Systems  Constitutional: Negative.   HENT:  Negative.   Eyes: Negative.   Respiratory: Negative.   Cardiovascular: Negative.   Gastrointestinal:  Negative.   Endocrine: Negative.   Genitourinary: Negative.    Musculoskeletal: Negative.   Skin: Negative.   Neurological: Negative.   Hematological: Negative.   Psychiatric/Behavioral: Negative.       PHYSICAL EXAMINATION  ECOG PERFORMANCE STATUS: 1 - Symptomatic but completely ambulatory  Vitals:   03/10/17 0920  BP: (!) 130/52  Pulse: (!) 57  Resp: 18  Temp: 97.7 F (36.5 C)  SpO2: 100%    Physical Exam  Constitutional: He is oriented to person, place, and time and well-developed, well-nourished, and in no distress. No distress.  HENT:  Head: Normocephalic.  Mouth/Throat: Oropharynx is clear and moist. No oropharyngeal exudate.  Eyes: Conjunctivae are normal. No scleral icterus.  Neck: Normal range of motion. Neck supple.  Cardiovascular: Normal rate, regular rhythm, normal heart sounds and intact distal pulses.   Pulmonary/Chest: Effort normal and breath sounds normal. No respiratory distress. He has no wheezes. He exhibits no tenderness.  Abdominal: Soft. Bowel sounds are normal. He exhibits no distension and no mass.  Musculoskeletal: Normal range of motion. He exhibits no edema.  Lymphadenopathy:    He has no cervical adenopathy.  Cervical, supraclavicular, and axillary nodes normal.  Neurological: He is alert and oriented to person, place, and time. Gait normal.  Skin: Skin is warm and dry. No rash noted. He is not diaphoretic. No erythema. No pallor.  Psychiatric: Mood, memory, affect and judgment normal.  Vitals reviewed.   LABORATORY DATA:  CBC    Component Value Date/Time   WBC 11.2 (H) 03/10/2017 0848   WBC 20.5 (H) 01/27/2017 0206   RBC 3.36 (L) 03/10/2017 0848   RBC 3.56 (L) 01/27/2017 0206   HGB 10.2 (L) 03/10/2017 0848   HCT 30.4 (L) 03/10/2017 0848   PLT 242 03/10/2017 0848   MCV 90.6 03/10/2017 0848   MCH 30.3 03/10/2017 0848   MCH 29.8 01/27/2017 0206   MCHC 33.4 03/10/2017 0848   MCHC 33.9 01/27/2017 0206   RDW 14.1 03/10/2017 0848    LYMPHSABS 5.3 (H) 03/10/2017 0848   MONOABS 0.8 03/10/2017 0848   EOSABS 0.3 03/10/2017 0848   BASOSABS 0.1 03/10/2017 0848    CMP     Component Value Date/Time   NA 137 03/10/2017 0848   K 4.3 03/10/2017 0848   CL 103 02/09/2017 0922   CO2 23 03/10/2017 0848   GLUCOSE 293 (H) 03/10/2017 0848   BUN 79.1 (H) 03/10/2017 0848   CREATININE 3.4 (HH) 03/10/2017 0848   CALCIUM 9.6 03/10/2017 0848   PROT 6.5 03/10/2017 0848   ALBUMIN 3.5 03/10/2017 0848   AST 13 03/10/2017 0848   ALT 16 03/10/2017 0848   ALKPHOS 64 03/10/2017 0848   BILITOT 0.38 03/10/2017 0848   GFRNONAA 13 (L) 01/27/2017 0206   GFRAA 15 (L) 01/27/2017 0206   RADIOGRAPHIC STUDIES:  No results found.  ASSESSMENT and THERAPY PLAN:   CLL (chronic lymphocytic leukemia) ([facility]) This is a very pleasant 81 year old African-American male with history of  chronic lymphocytic leukemia. The patient is currently on observation. His total white blood count today is 11.2. I discussed the lab result with the patient today and recommended for him to continue on observation with repeat CBC, comprehensive metabolic panel and LDH in 6 months. For the chronic renal insufficiency, the patient is followed by Dr. Florene Glen.   Orders Placed This Encounter  Procedures  . Comprehensive metabolic panel    Standing Status:   Future    Standing Expiration Date:   03/10/2018  . CBC with Differential/Platelet    Standing Status:   Future    Standing Expiration Date:   03/10/2018  . Lactate dehydrogenase    Standing Status:   Future    Standing Expiration Date:   03/10/2018    All questions were answered. The patient knows to call the clinic with any problems, questions or concerns. We can certainly see the patient much sooner if necessary.  Mikey Bussing, NP 03/10/2017

## 2017-03-10 NOTE — Telephone Encounter (Signed)
Scheduled appt per 8/21 los - Gave patient AVS and calender per los.  

## 2017-03-11 ENCOUNTER — Other Ambulatory Visit: Payer: Self-pay

## 2017-03-11 DIAGNOSIS — N185 Chronic kidney disease, stage 5: Secondary | ICD-10-CM

## 2017-03-11 DIAGNOSIS — Z01810 Encounter for preprocedural cardiovascular examination: Secondary | ICD-10-CM

## 2017-04-08 DIAGNOSIS — N184 Chronic kidney disease, stage 4 (severe): Secondary | ICD-10-CM | POA: Diagnosis not present

## 2017-04-08 DIAGNOSIS — E1165 Type 2 diabetes mellitus with hyperglycemia: Secondary | ICD-10-CM | POA: Diagnosis not present

## 2017-04-08 DIAGNOSIS — I25119 Atherosclerotic heart disease of native coronary artery with unspecified angina pectoris: Secondary | ICD-10-CM | POA: Diagnosis not present

## 2017-04-08 DIAGNOSIS — E1122 Type 2 diabetes mellitus with diabetic chronic kidney disease: Secondary | ICD-10-CM | POA: Diagnosis not present

## 2017-04-14 DIAGNOSIS — E7800 Pure hypercholesterolemia, unspecified: Secondary | ICD-10-CM | POA: Diagnosis not present

## 2017-04-22 ENCOUNTER — Encounter: Payer: Self-pay | Admitting: *Deleted

## 2017-04-22 ENCOUNTER — Other Ambulatory Visit: Payer: Self-pay | Admitting: *Deleted

## 2017-04-22 ENCOUNTER — Ambulatory Visit (HOSPITAL_COMMUNITY)
Admission: RE | Admit: 2017-04-22 | Discharge: 2017-04-22 | Disposition: A | Discharge status: Home / Self Care | Payer: Medicare Other | Source: Ambulatory Visit | Attending: Vascular Surgery | Admitting: Vascular Surgery

## 2017-04-22 ENCOUNTER — Ambulatory Visit (INDEPENDENT_AMBULATORY_CARE_PROVIDER_SITE_OTHER): Payer: Medicare Other | Admitting: Vascular Surgery

## 2017-04-22 ENCOUNTER — Encounter: Payer: Self-pay | Admitting: Vascular Surgery

## 2017-04-22 VITALS — BP 119/61 | HR 67 | Resp 16 | Ht 72.0 in | Wt 233.0 lb

## 2017-04-22 DIAGNOSIS — N185 Chronic kidney disease, stage 5: Secondary | ICD-10-CM | POA: Diagnosis not present

## 2017-04-22 DIAGNOSIS — Z01810 Encounter for preprocedural cardiovascular examination: Secondary | ICD-10-CM | POA: Insufficient documentation

## 2017-04-22 NOTE — Progress Notes (Signed)
Patient name: Ray Merritt MRN: [id number] DOB: August 12, 1935 Sex: male   REASON FOR CONSULT:    Evaluate for hemodialysis access. The consult is requested by Dr. Erling Cruz.  HPI:   Ray Merritt is a pleasant 81 y.o. male,  who was referred for evaluation for hemodialysis access. I have reviewed the records that were sent from [facility]. The patient has chronic kidney disease thought to be secondary to hypertensive nephropathy. He has multiple medical conditions including dyslipidemia, diabetes, and hyperkalemia. In addition he has chronic lymphocytic leukemia.   The patient's only uremic symptoms and has been fatigued. He denies any nausea, vomiting, anorexia, or palpitations.  Past Medical History:  Diagnosis Date  . Anemia   . Arthritis   . Cancer ([place]) 2015-present   CLL  . Chronic kidney disease   . Coronary artery disease 04/2016   1 stent  . Diabetes mellitus    insulin dependent   . Enlarged prostate   . Hypertension     Family History  Problem Relation Age of Onset  . Hypertension Mother   . Heart disease Sister   . Stroke Sister   . Diabetes Brother     SOCIAL HISTORY: Social History   Social History  . Marital status: Married    Spouse name: N/A  . Number of children: N/A  . Years of education: N/A   Occupational History  . retired    Social History Main Topics  . Smoking status: Former Research scientist (life sciences)  . Smokeless tobacco: Never Used     Comment: quit smoking in early 70's  . Alcohol use No  . Drug use: No  . Sexual activity: Not on file   Other Topics Concern  . Not on file   Social History Narrative  . No narrative on file    No Known Allergies  Current Outpatient Prescriptions  Medication Sig Dispense Refill  . ALPHAGAN P 0.1 % SOLN Place 1 drop into both eyes 2 (two) times daily.  0  . Ascorbic Acid (VITAMIN C) 1000 MG tablet Take 1,000 mg by mouth daily.    Marland Kitchen aspirin EC 81 MG tablet Take 81 mg by mouth daily.    Marland Kitchen  atorvastatin (LIPITOR) 20 MG tablet Take 20 mg by mouth daily.  0  . carvedilol (COREG) 25 MG tablet Take 25 mg by mouth 2 (two) times daily with a meal.   0  . cholecalciferol (VITAMIN D) 1000 units tablet Take 1,000 Units by mouth daily.     . cloNIDine (CATAPRES) 0.1 MG tablet Take 0.1 mg by mouth 2 (two) times daily.  0  . ferrous sulfate 325 (65 FE) MG tablet Take 325 mg by mouth daily with breakfast.    . finasteride (PROSCAR) 5 MG tablet Take 5 mg by mouth daily.    . furosemide (LASIX) 80 MG tablet Take 80 mg by mouth daily.  1  . hydrALAZINE (APRESOLINE) 50 MG tablet Take 50 mg by mouth 3 (three) times daily.  0  . insulin NPH Human (HUMULIN N,NOVOLIN N) 100 UNIT/ML injection Inject 10-26 Units into the skin 2 (two) times daily. Pt uses 10 units in the morning and 26 units in the evening.    . insulin regular (NOVOLIN R,HUMULIN R) 100 units/mL injection Inject 6-7 Units into the skin 2 (two) times daily. Pt uses 6 units in the morning and 7 units at bedtime.    . isosorbide mononitrate (IMDUR) 120 MG 24 hr tablet  Take 120 mg by mouth daily.    . Multiple Vitamin (MULTIVITAMIN WITH MINERALS) TABS tablet Take 1 tablet by mouth daily.    Marland Kitchen omega-3 acid ethyl esters (LOVAZA) 1 g capsule Take 1 g by mouth daily.    . tamsulosin (FLOMAX) 0.4 MG CAPS capsule Take 0.4 mg by mouth daily after supper.     . Travoprost, BAK Free, (TRAVATAN) 0.004 % SOLN ophthalmic solution Place 1 drop into both eyes at bedtime.    . triamcinolone cream (KENALOG) 0.1 % Apply 1 application topically 2 (two) times daily as needed (for itching/irritation).   0   No current facility-administered medications for this visit.     REVIEW OF SYSTEMS:  [X]  denotes positive finding, [ ]  denotes negative finding Cardiac  Comments:  Chest pain or chest pressure:    Shortness of breath upon exertion: X   Short of breath when lying flat:    Irregular heart rhythm:        Vascular    Pain in calf, thigh, or hip brought on  by ambulation:    Pain in feet at night that wakes you up from your sleep:     Blood clot in your veins:    Leg swelling:         Pulmonary    Oxygen at home:    Productive cough:     Wheezing:         Neurologic    Sudden weakness in arms or legs:     Sudden numbness in arms or legs:     Sudden onset of difficulty speaking or slurred speech:    Temporary loss of vision in one eye:     Problems with dizziness:         Gastrointestinal    Blood in stool:     Vomited blood:         Genitourinary    Burning when urinating:     Blood in urine:        Psychiatric    Major depression:         Hematologic    Bleeding problems:    Problems with blood clotting too easily:        Skin    Rashes or ulcers:        Constitutional    Fever or chills:     PHYSICAL EXAM:   Vitals:   04/22/17 1322  BP: 119/61  Pulse: 67  Resp: 16  SpO2: 99%  Weight: 233 lb (105.7 kg)  Height: 6' (1.829 m)    GENERAL: The patient is a well-nourished male, in no acute distress. The vital signs are documented above. CARDIAC: There is a regular rate and rhythm.  VASCULAR: I do not detect carotid bruits. He has palpable brachial and radial pulses bilaterally. PULMONARY: There is good air exchange bilaterally without wheezing or rales. ABDOMEN: Soft and non-tender with normal pitched bowel sounds.  MUSCULOSKELETAL: There are no major deformities or cyanosis. NEUROLOGIC: No focal weakness or paresthesias are detected. SKIN: There are no ulcers or rashes noted. PSYCHIATRIC: The patient has a normal affect.  DATA:    LABS: GFR on 09/15/2016 was 15.  UPPER EXTREMITY VEIN MAP: I have been acutely interpreted his upper extremity vein map.  On the right side the forearm and upper arm cephalic vein look reasonable in size. The basilic vein also looks reasonable.  On the left side the forearm and upper arm cephalic vein look reasonable in size. The  basilic vein also looks reasonable in  size.  UPPER EXTREMITY ARTERIAL DUPLEX: I have independently interpreted his upper extremity arterial duplex scan.  On the right side there is a triphasic radial and ulnar waveform. The brachial artery measures 0.56 cm in diameter.  On the left side there is a triphasic radial and ulnar waveform. The brachial artery measures 0.67 cm in diameter.  MEDICAL ISSUES:   STAGE V CHRONIC KIDNEY DISEASE: The patient appears to be a reasonable candidate for a left AV fistula.I have explained the indications for placement of an AV fistula or AV graft. I've explained that if at all possible we will place an AV fistula.  I have reviewed the risks of placement of an AV fistula including but not limited to: failure of the fistula to mature, need for subsequent interventions, and thrombosis. In addition I have reviewed the potential complications of placement of an AV graft. These risks include, but are not limited to, graft thrombosis, graft infection, wound healing problems, bleeding, arm swelling, and steal syndrome. All the patient's questions were answered and they are agreeable to proceed with surgery. His surgery has been scheduled for 04/28/2017.   Deitra Mayo [facility] [place] [phone]

## 2017-04-24 ENCOUNTER — Encounter (HOSPITAL_COMMUNITY): Payer: Self-pay | Admitting: *Deleted

## 2017-04-24 NOTE — Progress Notes (Signed)
Pt denies SOB and chest pain. Pt under the care of Dr. Einar Gip, Cardiology. Pt stated that he was instructed to take half dose of his insulin the night before surgery and no insulin DOS. Pt made aware to stop taking otc vitamins, fish oil, herbal medications. Do not take any NSAIDs ie: Ibuprofen, Advil, Naproxen (Aleve), Motrin, BC and Goody Powder. Pt made aware to check BG every 2 hours prior to arrival to hospital on DOS. Pt made aware to treat a BG < 70 with 4 glucose tabs and wait 15 minutes after intervention to recheck BG, if BG remains < 70, call Short Stay unit to speak with a nurse.Pt verbalized understanding of all pre-op instructions. Anesthesia asked to review pt history.

## 2017-04-27 NOTE — Progress Notes (Signed)
Anesthesia Chart Review:  Pt is a same day work up.   Pt is a 81 year old male scheduled for L AV fistula creation vs graft insertion on 04/28/2017 with Deitra Mayo, MD  - PCP is Aura Dials, MD - Nephrologist is Erling Cruz, MD - Oncologist is Curt Bears, MD - Endocrinologist is Elayne Snare, MD - Cardiologist is Kela Millin, MD; last office visit 04/08/17 with Jeri Lager, NP  PMH includes:  CAD (stent 2017), HTN, DM, CLL, anemia, CKD (stage V). Former smoker. BMI 32  Medications include: ASA 81 mg, Lipitor, carvedilol, clonidine, iron, Lasix, hydralazine, NPH insulin, regular insulin, Imdur  Labs will be obtained DOS.   CXR 01/27/17: Left lung base atelectasis versus infiltrate.  EKG 12/08/16 (Dr. Irven Shelling office): NSR. ST segment depression and T wave inversion new.   Nuclear stress test 12/19/16 (Dr. Irven Shelling office):  1. Resting EKG: NSR, normal resting conduction, no resting arrhythmias, and ST depression in V3 to V6. Nonspecific T change. Stress EKG nondiagnostic for ischemia. Stress symptoms included dyspnea 2. LV cavity enlarged on rest and stress studies. LV end-diastolic volume 284 mL. Rest and stress images demonstrate decreased tracer uptake in the basal inferior, mid inferior, and apical inferior segments of LV. These defects are related to diaphragmatic attenuation. There is no ischemia. LV EF calculated to be 39% with global hypokinesis. This is an intermediate risk study, clinical correlation recommended.  - Note: pt was seen by Dr. Vear Clock at [facility] on 12/30/16 for f/u stress test.  Note indicates results of stress test discussed with pt.  Dr. Woody Seller felt pt was stable, asymptomatic and that further eval not needed unless pt developed sx.  Next office visit 04/08/17 documents pt remains asymptomatic.    Echo 12/11/15:  - Left ventricle: The cavity size was normal. Wall thickness was normal. Systolic function was normal. The estimated  ejection fraction was in the range of 50% to 55%. Akinesis of the midanteroseptal myocardium. - Mitral valve: There was mild regurgitation. - Left atrium: The atrium was moderately dilated.  Cardiac cath 05/01/15:  1. LM: distal 20% stenosis, mild calcification 2. CX: large, dominant, ostial calcified 30-40% stenosis. Mild diffuse disease distally. FFR to CX: insignificant stenosis.  3. LAD: mildly calcified. Midsegment 60-70% stenosis, some views appeared to be 80%. Large D1. FFR to LAD: significant stenosis mid LAD 4. Ramus intermediate: Small to moderate vessel, mild calcification evident, mild diffuse disease  PCI 05/22/15:  1. RCA: small, nondominant and mildly diseased. 2. DES to LAD 3. Loss of septal perforator after DES; 2nd look later that day found the septal perforator that was occluded is now patent but fills slowly  If labs acceptable DOS, I anticipate pt can proceed with surgery as scheduled.   Willeen Cass, FNP-BC [facility]/Anesthesiology Phone: [phone] 04/27/2017 4:20 PM

## 2017-04-28 ENCOUNTER — Encounter (HOSPITAL_COMMUNITY): Payer: Self-pay | Admitting: Certified Registered"

## 2017-04-28 ENCOUNTER — Telehealth: Payer: Self-pay | Admitting: Vascular Surgery

## 2017-04-28 ENCOUNTER — Ambulatory Visit (HOSPITAL_COMMUNITY)
Admission: RE | Admit: 2017-04-28 | Discharge: 2017-04-28 | Disposition: A | Discharge status: Home / Self Care | Payer: Medicare Other | Source: Ambulatory Visit | Attending: Vascular Surgery | Admitting: Vascular Surgery

## 2017-04-28 ENCOUNTER — Encounter (HOSPITAL_COMMUNITY)
Admission: RE | Disposition: A | Discharge status: Home / Self Care | Payer: Self-pay | Source: Ambulatory Visit | Attending: Vascular Surgery

## 2017-04-28 ENCOUNTER — Ambulatory Visit (HOSPITAL_COMMUNITY): Payer: Medicare Other | Admitting: Emergency Medicine

## 2017-04-28 DIAGNOSIS — N184 Chronic kidney disease, stage 4 (severe): Secondary | ICD-10-CM | POA: Insufficient documentation

## 2017-04-28 DIAGNOSIS — I2510 Atherosclerotic heart disease of native coronary artery without angina pectoris: Secondary | ICD-10-CM | POA: Diagnosis not present

## 2017-04-28 DIAGNOSIS — E1122 Type 2 diabetes mellitus with diabetic chronic kidney disease: Secondary | ICD-10-CM | POA: Diagnosis not present

## 2017-04-28 DIAGNOSIS — Z955 Presence of coronary angioplasty implant and graft: Secondary | ICD-10-CM | POA: Insufficient documentation

## 2017-04-28 DIAGNOSIS — C9110 Chronic lymphocytic leukemia of B-cell type not having achieved remission: Secondary | ICD-10-CM | POA: Diagnosis not present

## 2017-04-28 DIAGNOSIS — Z87891 Personal history of nicotine dependence: Secondary | ICD-10-CM | POA: Insufficient documentation

## 2017-04-28 DIAGNOSIS — I129 Hypertensive chronic kidney disease with stage 1 through stage 4 chronic kidney disease, or unspecified chronic kidney disease: Secondary | ICD-10-CM | POA: Insufficient documentation

## 2017-04-28 DIAGNOSIS — Z794 Long term (current) use of insulin: Secondary | ICD-10-CM | POA: Insufficient documentation

## 2017-04-28 DIAGNOSIS — Z7982 Long term (current) use of aspirin: Secondary | ICD-10-CM | POA: Insufficient documentation

## 2017-04-28 DIAGNOSIS — D649 Anemia, unspecified: Secondary | ICD-10-CM | POA: Diagnosis not present

## 2017-04-28 DIAGNOSIS — N189 Chronic kidney disease, unspecified: Secondary | ICD-10-CM | POA: Diagnosis not present

## 2017-04-28 DIAGNOSIS — N400 Enlarged prostate without lower urinary tract symptoms: Secondary | ICD-10-CM | POA: Insufficient documentation

## 2017-04-28 DIAGNOSIS — Z79899 Other long term (current) drug therapy: Secondary | ICD-10-CM | POA: Insufficient documentation

## 2017-04-28 DIAGNOSIS — E785 Hyperlipidemia, unspecified: Secondary | ICD-10-CM | POA: Diagnosis not present

## 2017-04-28 HISTORY — DX: Headache: R51

## 2017-04-28 HISTORY — DX: Headache, unspecified: R51.9

## 2017-04-28 HISTORY — DX: Pneumonia, unspecified organism: J18.9

## 2017-04-28 HISTORY — PX: AV FISTULA PLACEMENT: SHX1204

## 2017-04-28 LAB — GLUCOSE, CAPILLARY: Glucose-Capillary: 150 mg/dL — ABNORMAL HIGH (ref 65–99)

## 2017-04-28 LAB — POCT I-STAT 4, (NA,K, GLUC, HGB,HCT)
GLUCOSE: 135 mg/dL — AB (ref 65–99)
HEMATOCRIT: 28 % — AB (ref 39.0–52.0)
Hemoglobin: 9.5 g/dL — ABNORMAL LOW (ref 13.0–17.0)
POTASSIUM: 3.8 mmol/L (ref 3.5–5.1)
SODIUM: 138 mmol/L (ref 135–145)

## 2017-04-28 SURGERY — ARTERIOVENOUS (AV) FISTULA CREATION
Anesthesia: Monitor Anesthesia Care | Site: Arm Lower | Laterality: Left

## 2017-04-28 MED ORDER — ONDANSETRON HCL 4 MG/2ML IJ SOLN
INTRAMUSCULAR | Status: AC
  Filled 2017-04-28: qty 2

## 2017-04-28 MED ORDER — ONDANSETRON HCL 4 MG/2ML IJ SOLN
INTRAMUSCULAR | Status: DC | PRN
  Administered 2017-04-28: 4 mg via INTRAVENOUS

## 2017-04-28 MED ORDER — EPHEDRINE 5 MG/ML INJ
INTRAVENOUS | Status: AC
  Filled 2017-04-28: qty 10

## 2017-04-28 MED ORDER — CHLORHEXIDINE GLUCONATE 4 % EX LIQD: 60.0000 mL | Freq: Once | CUTANEOUS | Status: DC

## 2017-04-28 MED ORDER — LIDOCAINE HCL 1 % IJ SOLN
INTRAMUSCULAR | Status: AC
  Filled 2017-04-28: qty 20

## 2017-04-28 MED ORDER — LIDOCAINE HCL (PF) 1 % IJ SOLN
INTRAMUSCULAR | Status: DC | PRN
  Administered 2017-04-28: 13 mL

## 2017-04-28 MED ORDER — FENTANYL CITRATE (PF) 250 MCG/5ML IJ SOLN
INTRAMUSCULAR | Status: AC
  Filled 2017-04-28: qty 5

## 2017-04-28 MED ORDER — PROPOFOL 1000 MG/100ML IV EMUL
INTRAVENOUS | Status: AC
  Filled 2017-04-28: qty 100

## 2017-04-28 MED ORDER — PROTAMINE SULFATE 10 MG/ML IV SOLN
INTRAVENOUS | Status: AC
  Filled 2017-04-28: qty 5

## 2017-04-28 MED ORDER — OXYCODONE-ACETAMINOPHEN 5-325 MG PO TABS: 1.0000 | ORAL_TABLET | Freq: Four times a day (QID) | ORAL | 0 refills | Status: DC | PRN

## 2017-04-28 MED ORDER — MIDAZOLAM HCL 2 MG/2ML IJ SOLN: 0.5000 mg | Freq: Once | INTRAMUSCULAR | Status: DC | PRN

## 2017-04-28 MED ORDER — LIDOCAINE 2% (20 MG/ML) 5 ML SYRINGE
INTRAMUSCULAR | Status: AC
  Filled 2017-04-28: qty 5

## 2017-04-28 MED ORDER — PHENYLEPHRINE 40 MCG/ML (10ML) SYRINGE FOR IV PUSH (FOR BLOOD PRESSURE SUPPORT)
PREFILLED_SYRINGE | INTRAVENOUS | Status: AC
  Filled 2017-04-28: qty 10

## 2017-04-28 MED ORDER — PROPOFOL 500 MG/50ML IV EMUL
INTRAVENOUS | Status: DC | PRN
  Administered 2017-04-28: 75 ug/kg/min via INTRAVENOUS

## 2017-04-28 MED ORDER — HEPARIN SODIUM (PORCINE) 1000 UNIT/ML IJ SOLN
INTRAMUSCULAR | Status: AC
  Filled 2017-04-28: qty 1

## 2017-04-28 MED ORDER — SODIUM CHLORIDE 0.9 % IV SOLN
INTRAVENOUS | Status: DC | PRN
  Administered 2017-04-28 (×2): via INTRAVENOUS

## 2017-04-28 MED ORDER — HEPARIN SODIUM (PORCINE) 1000 UNIT/ML IJ SOLN
INTRAMUSCULAR | Status: DC | PRN
  Administered 2017-04-28: 8000 [IU] via INTRAVENOUS

## 2017-04-28 MED ORDER — PROPOFOL 10 MG/ML IV BOLUS
INTRAVENOUS | Status: DC | PRN
  Administered 2017-04-28: 20 mg via INTRAVENOUS

## 2017-04-28 MED ORDER — PROMETHAZINE HCL 25 MG/ML IJ SOLN: 6.2500 mg | INTRAMUSCULAR | Status: DC | PRN

## 2017-04-28 MED ORDER — SODIUM CHLORIDE 0.9 % IV SOLN
INTRAVENOUS | Status: DC | PRN
  Administered 2017-04-28: 500 mL

## 2017-04-28 MED ORDER — FENTANYL CITRATE (PF) 100 MCG/2ML IJ SOLN
INTRAMUSCULAR | Status: DC | PRN
  Administered 2017-04-28: 25 ug via INTRAVENOUS

## 2017-04-28 MED ORDER — CHLORHEXIDINE GLUCONATE 4 % EX LIQD
60.0000 mL | Freq: Once | CUTANEOUS | Status: DC
Start: 2017-04-28 — End: 2017-04-28

## 2017-04-28 MED ORDER — 0.9 % SODIUM CHLORIDE (POUR BTL) OPTIME
TOPICAL | Status: DC | PRN
  Administered 2017-04-28: 1000 mL

## 2017-04-28 MED ORDER — LIDOCAINE HCL (CARDIAC) 20 MG/ML IV SOLN
INTRAVENOUS | Status: DC | PRN
  Administered 2017-04-28: 50 mg via INTRATRACHEAL

## 2017-04-28 MED ORDER — MEPERIDINE HCL 25 MG/ML IJ SOLN: 6.2500 mg | INTRAMUSCULAR | Status: DC | PRN

## 2017-04-28 MED ORDER — DEXTROSE 5 % IV SOLN
1.5000 g | INTRAVENOUS | Status: AC
  Administered 2017-04-28: 1.5 g via INTRAVENOUS
  Filled 2017-04-28: qty 1.5

## 2017-04-28 MED ORDER — PROPOFOL 10 MG/ML IV BOLUS
INTRAVENOUS | Status: AC
  Filled 2017-04-28: qty 20

## 2017-04-28 MED ORDER — PROTAMINE SULFATE 10 MG/ML IV SOLN
INTRAVENOUS | Status: DC | PRN
  Administered 2017-04-28: 40 mg via INTRAVENOUS

## 2017-04-28 MED ORDER — SODIUM CHLORIDE 0.9 % IV SOLN: INTRAVENOUS | Status: DC

## 2017-04-28 MED ORDER — FENTANYL CITRATE (PF) 100 MCG/2ML IJ SOLN: 25.0000 ug | INTRAMUSCULAR | Status: DC | PRN

## 2017-04-28 SURGICAL SUPPLY — 36 items
ADH SKN CLS APL DERMABOND .7 (GAUZE/BANDAGES/DRESSINGS) ×1
ARMBAND PINK RESTRICT EXTREMIT (MISCELLANEOUS) ×6 IMPLANT
CANISTER SUCT 3000ML PPV (MISCELLANEOUS) ×3 IMPLANT
CANNULA VESSEL 3MM 2 BLNT TIP (CANNULA) ×3 IMPLANT
CLIP VESOCCLUDE MED 6/CT (CLIP) ×3 IMPLANT
CLIP VESOCCLUDE SM WIDE 6/CT (CLIP) ×5 IMPLANT
COVER PROBE W GEL 5X96 (DRAPES) IMPLANT
DECANTER SPIKE VIAL GLASS SM (MISCELLANEOUS) ×3 IMPLANT
DERMABOND ADVANCED (GAUZE/BANDAGES/DRESSINGS) ×2
DERMABOND ADVANCED .7 DNX12 (GAUZE/BANDAGES/DRESSINGS) ×1 IMPLANT
ELECT REM PT RETURN 9FT ADLT (ELECTROSURGICAL) ×3
ELECTRODE REM PT RTRN 9FT ADLT (ELECTROSURGICAL) ×1 IMPLANT
GLOVE BIO SURGEON STRL SZ 6.5 (GLOVE) ×1 IMPLANT
GLOVE BIO SURGEON STRL SZ7.5 (GLOVE) ×3 IMPLANT
GLOVE BIO SURGEON STRL SZ8 (GLOVE) ×2 IMPLANT
GLOVE BIO SURGEONS STRL SZ 6.5 (GLOVE) ×1
GLOVE BIOGEL PI IND STRL 6.5 (GLOVE) IMPLANT
GLOVE BIOGEL PI IND STRL 8 (GLOVE) ×1 IMPLANT
GLOVE BIOGEL PI INDICATOR 6.5 (GLOVE) ×4
GLOVE BIOGEL PI INDICATOR 8 (GLOVE) ×2
GLOVE SKINSENSE NS SZ7.0 (GLOVE) ×2
GLOVE SKINSENSE STRL SZ7.0 (GLOVE) IMPLANT
GOWN STRL REUS W/ TWL LRG LVL3 (GOWN DISPOSABLE) ×3 IMPLANT
GOWN STRL REUS W/TWL LRG LVL3 (GOWN DISPOSABLE) ×9
KIT BASIN OR (CUSTOM PROCEDURE TRAY) ×3 IMPLANT
KIT ROOM TURNOVER OR (KITS) ×3 IMPLANT
NS IRRIG 1000ML POUR BTL (IV SOLUTION) ×3 IMPLANT
PACK CV ACCESS (CUSTOM PROCEDURE TRAY) ×3 IMPLANT
PAD ARMBOARD 7.5X6 YLW CONV (MISCELLANEOUS) ×6 IMPLANT
SPONGE SURGIFOAM ABS GEL 100 (HEMOSTASIS) IMPLANT
SUT PROLENE 6 0 BV (SUTURE) ×5 IMPLANT
SUT VIC AB 3-0 SH 27 (SUTURE) ×3
SUT VIC AB 3-0 SH 27X BRD (SUTURE) ×1 IMPLANT
SUT VICRYL 4-0 PS2 18IN ABS (SUTURE) ×3 IMPLANT
UNDERPAD 30X30 (UNDERPADS AND DIAPERS) ×3 IMPLANT
WATER STERILE IRR 1000ML POUR (IV SOLUTION) ×3 IMPLANT

## 2017-04-28 NOTE — Telephone Encounter (Signed)
-----   Message from Mena Goes, RN sent at 04/28/2017  8:56 AM EDT ----- Regarding: 6 weeks w/ duplex   ----- Message ----- From: Gabriel Earing, PA-C Sent: 04/28/2017   8:38 AM To: Vvs Charge Pool  S/p left RC AVF 04/28/17.  F/u with [facility] in 6 weeks with duplex.  Thanks

## 2017-04-28 NOTE — Discharge Instructions (Signed)
° °  [facility]  Discharge Instructions  AV Fistula or Graft Surgery for Dialysis Access  Please refer to the following instructions for your post-procedure care. Your surgeon or physician assistant will discuss any changes with you.  Activity  You may drive the day following your surgery, if you are comfortable and no longer taking prescription pain medication. Resume full activity as the soreness in your incision resolves.  Bathing/Showering  You may shower after you go home. Keep your incision dry for 48 hours. Do not soak in a bathtub, hot tub, or swim until the incision heals completely. You may not shower if you have a hemodialysis catheter.  Incision Care  Clean your incision with mild soap and water after 48 hours. Pat the area dry with a clean towel. You do not need a bandage unless otherwise instructed. Do not apply any ointments or creams to your incision. You may have skin glue on your incision. Do not peel it off. It will come off on its own in about one week. Your arm may swell a bit after surgery. To reduce swelling use pillows to elevate your arm so it is above your heart. Your doctor will tell you if you need to lightly wrap your arm with an ACE bandage.  Diet  Resume your normal diet. There are not special food restrictions following this procedure. In order to heal from your surgery, it is CRITICAL to get adequate nutrition. Your body requires vitamins, minerals, and protein. Vegetables are the best source of vitamins and minerals. Vegetables also provide the perfect balance of protein. Processed food has little nutritional value, so try to avoid this.  Medications  Resume taking all of your medications. If your incision is causing pain, you may take over-the counter pain relievers such as acetaminophen (Tylenol). If you were prescribed a stronger pain medication, please be aware these medications can cause nausea and constipation. Prevent  nausea by taking the medication with a snack or meal. Avoid constipation by drinking plenty of fluids and eating foods with high amount of fiber, such as fruits, vegetables, and grains. Do not take Tylenol if you are taking prescription pain medications.  Follow up Your surgeon may want to see you in the office following your access surgery. If so, this will be arranged at the time of your surgery.  Please call us immediately for any of the following conditions:  Increased pain, redness, drainage (pus) from your incision site Fever of 101 degrees or higher Severe or worsening pain at your incision site Hand pain or numbness.  Reduce your risk of vascular disease:  Stop smoking. If you would like help, call [facility] at 1-800-QUIT-NOW 857-588-1039) or [place] at Comfort your cholesterol Maintain a desired weight Control your diabetes Keep your blood pressure down  Dialysis  It will take several weeks to several months for your new dialysis access to be ready for use. Your surgeon will determine when it is OK to use it. Your nephrologist will continue to direct your dialysis. You can continue to use your Permcath until your new access is ready for use.   04/28/2017 Ray Merritt [id number] 30-May-1936  Surgeon(s): Angelia Mould, MD  Procedure(s): Left radial cephalic AV fistula  x Do not stick fistula for 12 weeks    If you have any questions, please call the office at [phone].

## 2017-04-28 NOTE — Anesthesia Preprocedure Evaluation (Addendum)
Anesthesia Evaluation    Airway Mallampati: I  TM Distance: >3 FB Neck ROM: Full    Dental  (+) Upper Dentures, Dental Advisory Given   Pulmonary former smoker,    breath sounds clear to auscultation       Cardiovascular hypertension, Pt. on home beta blockers and Pt. on medications + CAD and + Cardiac Stents   Rhythm:Regular Rate:Normal  '17 ECHO: EF 50-55%, valves OK   Neuro/Psych    GI/Hepatic   Endo/Other  diabetes, Insulin Dependent  Renal/GU      Musculoskeletal   Abdominal   Peds  Hematology  (+) Blood dyscrasia (Hb 9.5), anemia , CLL   Anesthesia Other Findings   Reproductive/Obstetrics                            Anesthesia Physical Anesthesia Plan  ASA: III  Anesthesia Plan: MAC   Post-op Pain Management:    Induction:   PONV Risk Score and Plan: 1 and Ondansetron and Treatment may vary due to age or medical condition  Airway Management Planned: Natural Airway and Simple Face Mask  Additional Equipment:   Intra-op Plan:   Post-operative Plan:   Informed Consent:   Plan Discussed with:   Anesthesia Plan Comments:         Anesthesia Quick Evaluation

## 2017-04-28 NOTE — Telephone Encounter (Signed)
Sched appt 06/17/17; lab at 12:00 and MD at 1:00. Spoke to pt.

## 2017-04-28 NOTE — Interval H&P Note (Signed)
History and Physical Interval Note:  04/28/2017 7:17 AM  Ray Merritt  has presented today for surgery, with the diagnosis of chronic kidney disease  The various methods of treatment have been discussed with the patient and family. After consideration of risks, benefits and other options for treatment, the patient has consented to  Procedure(s): ARTERIOVENOUS (AV) FISTULA CREATION VERSUS GRAFT INSERTION (Left) as a surgical intervention .  The patient's history has been reviewed, patient examined, no change in status, stable for surgery.  I have reviewed the patient's chart and labs.  Questions were answered to the patient's satisfaction.     Deitra Mayo

## 2017-04-28 NOTE — Anesthesia Postprocedure Evaluation (Signed)
Anesthesia Post Note  Patient: Ray Merritt  Procedure(s) Performed: Left arm ARTERIOVENOUS (AV) FISTULA CREATION (Left Arm Lower)     Patient location during evaluation: PACU Anesthesia Type: MAC Level of consciousness: awake and alert, patient cooperative and oriented Pain management: pain level controlled Vital Signs Assessment: post-procedure vital signs reviewed and stable Respiratory status: spontaneous breathing, nonlabored ventilation and respiratory function stable Cardiovascular status: blood pressure returned to baseline and stable Postop Assessment: no apparent nausea or vomiting Anesthetic complications: no    Last Vitals:  Vitals:   04/28/17 0900 04/28/17 0911  BP: (!) 126/51 (!) 126/48  Pulse: (!) 54 (!) 55  Resp: (!) 25   Temp:  (!) 36.1 C  SpO2: 98% 98%    Last Pain:  Vitals:   04/28/17 0854  TempSrc:   PainSc: 0-No pain                 Jawon Dipiero,E. Midori Dado

## 2017-04-28 NOTE — H&P (View-Only) (Signed)
Patient name: Ray Merritt MRN: [id number] DOB: 1935/12/03 Sex: male   REASON FOR CONSULT:    Evaluate for hemodialysis access. The consult is requested by Dr. Erling Cruz.  HPI:   Ray Merritt is a pleasant 81 y.o. male,  who was referred for evaluation for hemodialysis access. I have reviewed the records that were sent from [facility]. The patient has chronic kidney disease thought to be secondary to hypertensive nephropathy. He has multiple medical conditions including dyslipidemia, diabetes, and hyperkalemia. In addition he has chronic lymphocytic leukemia.   The patient's only uremic symptoms and has been fatigued. He denies any nausea, vomiting, anorexia, or palpitations.  Past Medical History:  Diagnosis Date  . Anemia   . Arthritis   . Cancer ([place]) 2015-present   CLL  . Chronic kidney disease   . Coronary artery disease 04/2016   1 stent  . Diabetes mellitus    insulin dependent   . Enlarged prostate   . Hypertension     Family History  Problem Relation Age of Onset  . Hypertension Mother   . Heart disease Sister   . Stroke Sister   . Diabetes Brother     SOCIAL HISTORY: Social History   Social History  . Marital status: Married    Spouse name: N/A  . Number of children: N/A  . Years of education: N/A   Occupational History  . retired    Social History Main Topics  . Smoking status: Former Research scientist (life sciences)  . Smokeless tobacco: Never Used     Comment: quit smoking in early 70's  . Alcohol use No  . Drug use: No  . Sexual activity: Not on file   Other Topics Concern  . Not on file   Social History Narrative  . No narrative on file    No Known Allergies  Current Outpatient Prescriptions  Medication Sig Dispense Refill  . ALPHAGAN P 0.1 % SOLN Place 1 drop into both eyes 2 (two) times daily.  0  . Ascorbic Acid (VITAMIN C) 1000 MG tablet Take 1,000 mg by mouth daily.    Marland Kitchen aspirin EC 81 MG tablet Take 81 mg by mouth daily.    Marland Kitchen  atorvastatin (LIPITOR) 20 MG tablet Take 20 mg by mouth daily.  0  . carvedilol (COREG) 25 MG tablet Take 25 mg by mouth 2 (two) times daily with a meal.   0  . cholecalciferol (VITAMIN D) 1000 units tablet Take 1,000 Units by mouth daily.     . cloNIDine (CATAPRES) 0.1 MG tablet Take 0.1 mg by mouth 2 (two) times daily.  0  . ferrous sulfate 325 (65 FE) MG tablet Take 325 mg by mouth daily with breakfast.    . finasteride (PROSCAR) 5 MG tablet Take 5 mg by mouth daily.    . furosemide (LASIX) 80 MG tablet Take 80 mg by mouth daily.  1  . hydrALAZINE (APRESOLINE) 50 MG tablet Take 50 mg by mouth 3 (three) times daily.  0  . insulin NPH Human (HUMULIN N,NOVOLIN N) 100 UNIT/ML injection Inject 10-26 Units into the skin 2 (two) times daily. Pt uses 10 units in the morning and 26 units in the evening.    . insulin regular (NOVOLIN R,HUMULIN R) 100 units/mL injection Inject 6-7 Units into the skin 2 (two) times daily. Pt uses 6 units in the morning and 7 units at bedtime.    . isosorbide mononitrate (IMDUR) 120 MG 24 hr tablet  Take 120 mg by mouth daily.    . Multiple Vitamin (MULTIVITAMIN WITH MINERALS) TABS tablet Take 1 tablet by mouth daily.    Marland Kitchen omega-3 acid ethyl esters (LOVAZA) 1 g capsule Take 1 g by mouth daily.    . tamsulosin (FLOMAX) 0.4 MG CAPS capsule Take 0.4 mg by mouth daily after supper.     . Travoprost, BAK Free, (TRAVATAN) 0.004 % SOLN ophthalmic solution Place 1 drop into both eyes at bedtime.    . triamcinolone cream (KENALOG) 0.1 % Apply 1 application topically 2 (two) times daily as needed (for itching/irritation).   0   No current facility-administered medications for this visit.     REVIEW OF SYSTEMS:  [X]  denotes positive finding, [ ]  denotes negative finding Cardiac  Comments:  Chest pain or chest pressure:    Shortness of breath upon exertion: X   Short of breath when lying flat:    Irregular heart rhythm:        Vascular    Pain in calf, thigh, or hip brought on  by ambulation:    Pain in feet at night that wakes you up from your sleep:     Blood clot in your veins:    Leg swelling:         Pulmonary    Oxygen at home:    Productive cough:     Wheezing:         Neurologic    Sudden weakness in arms or legs:     Sudden numbness in arms or legs:     Sudden onset of difficulty speaking or slurred speech:    Temporary loss of vision in one eye:     Problems with dizziness:         Gastrointestinal    Blood in stool:     Vomited blood:         Genitourinary    Burning when urinating:     Blood in urine:        Psychiatric    Major depression:         Hematologic    Bleeding problems:    Problems with blood clotting too easily:        Skin    Rashes or ulcers:        Constitutional    Fever or chills:     PHYSICAL EXAM:   Vitals:   04/22/17 1322  BP: 119/61  Pulse: 67  Resp: 16  SpO2: 99%  Weight: 233 lb (105.7 kg)  Height: 6' (1.829 m)    GENERAL: The patient is a well-nourished male, in no acute distress. The vital signs are documented above. CARDIAC: There is a regular rate and rhythm.  VASCULAR: I do not detect carotid bruits. He has palpable brachial and radial pulses bilaterally. PULMONARY: There is good air exchange bilaterally without wheezing or rales. ABDOMEN: Soft and non-tender with normal pitched bowel sounds.  MUSCULOSKELETAL: There are no major deformities or cyanosis. NEUROLOGIC: No focal weakness or paresthesias are detected. SKIN: There are no ulcers or rashes noted. PSYCHIATRIC: The patient has a normal affect.  DATA:    LABS: GFR on 09/15/2016 was 15.  UPPER EXTREMITY VEIN MAP: I have been acutely interpreted his upper extremity vein map.  On the right side the forearm and upper arm cephalic vein look reasonable in size. The basilic vein also looks reasonable.  On the left side the forearm and upper arm cephalic vein look reasonable in size. The  basilic vein also looks reasonable in  size.  UPPER EXTREMITY ARTERIAL DUPLEX: I have independently interpreted his upper extremity arterial duplex scan.  On the right side there is a triphasic radial and ulnar waveform. The brachial artery measures 0.56 cm in diameter.  On the left side there is a triphasic radial and ulnar waveform. The brachial artery measures 0.67 cm in diameter.  MEDICAL ISSUES:   STAGE V CHRONIC KIDNEY DISEASE: The patient appears to be a reasonable candidate for a left AV fistula.I have explained the indications for placement of an AV fistula or AV graft. I've explained that if at all possible we will place an AV fistula.  I have reviewed the risks of placement of an AV fistula including but not limited to: failure of the fistula to mature, need for subsequent interventions, and thrombosis. In addition I have reviewed the potential complications of placement of an AV graft. These risks include, but are not limited to, graft thrombosis, graft infection, wound healing problems, bleeding, arm swelling, and steal syndrome. All the patient's questions were answered and they are agreeable to proceed with surgery. His surgery has been scheduled for 04/28/2017.   Deitra Mayo [facility] [phone]

## 2017-04-28 NOTE — Op Note (Signed)
    NAME: Ray Merritt    MRN: [id number] DOB: 08-16-1935    DATE OF OPERATION: 04/28/2017  PREOP DIAGNOSIS:    Stage IV chronic kidney disease  POSTOP DIAGNOSIS:    Same  PROCEDURE:    Left radial cephalic AV fistula  SURGEON: Judeth Cornfield. Scot Dock, MD, FACS  ASSIST: Leontine Locket, PA  ANESTHESIA: Local with sedation   EBL: Minimal  INDICATIONS:    Ray Merritt is a 81 y.o. male who presents for new access.  FINDINGS:    The forearm cephalic vein appeared to empty into the basilic system. The vein became small to distal wrist white male the proximal anastomosis slightly higher up the forearm. 3.5 mm cephalic vein.  TECHNIQUE:    The patient was taken to the operating room and sedated by anesthesia. The left upper extremity was prepped and draped in usual sterile fashion. After the skin was anesthetized, an oblique incision was made in the wrist and the cephalic vein was dissected free area and branches were divided between clips and 3-0 silk ties. The vein was ligated distally and irrigated up with heparinized saline. The radial artery was dissected free beneath the fascia. The patient was heparinized. The radial artery was opened longitudinally. The vein was sewn end-to-side to the artery using continuous 6-0 Prolene suture. Completion was a good thrill in the fistula. Hemostasis was obtained in the wound and the heparin was partially reversed with protamine. The wound was closed deeply with 3-0 Vicryl and the skin closed with 4-0 Vicryl. Dermabond was applied. The patient tolerated the procedure well and was transferred to the recovery room in stable condition. All needle and sponge counts were correct.  Deitra Mayo, MD, FACS [facility]  DATE OF DICTATION:   04/28/2017

## 2017-04-28 NOTE — Transfer of Care (Signed)
Immediate Anesthesia Transfer of Care Note  Patient: Ray Merritt  Procedure(s) Performed: Left arm ARTERIOVENOUS (AV) FISTULA CREATION (Left Arm Lower)  Patient Location: PACU  Anesthesia Type:MAC  Level of Consciousness: awake and alert   Airway & Oxygen Therapy: Patient Spontanous Breathing  Post-op Assessment: Report given to RN and Post -op Vital signs reviewed and stable  Post vital signs: Reviewed and stable  Last Vitals:  Vitals:   04/28/17 0608  BP: (!) 153/55  Pulse: (!) 58  Resp: 18  Temp: 36.6 C  SpO2: 100%    Last Pain:  Vitals:   04/28/17 0608  TempSrc: Oral         Complications: No apparent anesthesia complications

## 2017-04-28 NOTE — Progress Notes (Signed)
Report given to grace rn as caregiver

## 2017-04-29 ENCOUNTER — Other Ambulatory Visit: Payer: Self-pay

## 2017-04-29 ENCOUNTER — Encounter (HOSPITAL_COMMUNITY): Payer: Self-pay | Admitting: Vascular Surgery

## 2017-04-29 DIAGNOSIS — Z48812 Encounter for surgical aftercare following surgery on the circulatory system: Secondary | ICD-10-CM

## 2017-05-06 DIAGNOSIS — H401131 Primary open-angle glaucoma, bilateral, mild stage: Secondary | ICD-10-CM | POA: Diagnosis not present

## 2017-05-12 ENCOUNTER — Other Ambulatory Visit (INDEPENDENT_AMBULATORY_CARE_PROVIDER_SITE_OTHER): Payer: Medicare Other

## 2017-05-12 DIAGNOSIS — Z794 Long term (current) use of insulin: Secondary | ICD-10-CM

## 2017-05-12 DIAGNOSIS — E041 Nontoxic single thyroid nodule: Secondary | ICD-10-CM

## 2017-05-12 DIAGNOSIS — E1165 Type 2 diabetes mellitus with hyperglycemia: Secondary | ICD-10-CM

## 2017-05-12 LAB — TSH: TSH: 2.18 u[IU]/mL (ref 0.35–4.50)

## 2017-05-12 LAB — LIPID PANEL
CHOL/HDL RATIO: 5
CHOLESTEROL: 129 mg/dL (ref 0–200)
HDL: 27.3 mg/dL — ABNORMAL LOW (ref >39.00)
NonHDL: 101.84
TRIGLYCERIDES: 300 mg/dL — AB (ref 0.0–149.0)
VLDL: 60 mg/dL — AB (ref 0.0–40.0)

## 2017-05-12 LAB — HEMOGLOBIN A1C: Hgb A1c MFr Bld: 7.6 % — ABNORMAL HIGH (ref 4.6–6.5)

## 2017-05-12 LAB — GLUCOSE, RANDOM: Glucose, Bld: 161 mg/dL — ABNORMAL HIGH (ref 70–99)

## 2017-05-12 LAB — LDL CHOLESTEROL, DIRECT: Direct LDL: 60 mg/dL

## 2017-05-15 ENCOUNTER — Ambulatory Visit (INDEPENDENT_AMBULATORY_CARE_PROVIDER_SITE_OTHER): Payer: Medicare Other | Admitting: Endocrinology

## 2017-05-15 ENCOUNTER — Encounter: Payer: Self-pay | Admitting: Endocrinology

## 2017-05-15 VITALS — BP 142/84 | HR 73 | Ht 75.0 in | Wt 232.8 lb

## 2017-05-15 DIAGNOSIS — E1165 Type 2 diabetes mellitus with hyperglycemia: Secondary | ICD-10-CM

## 2017-05-15 DIAGNOSIS — E782 Mixed hyperlipidemia: Secondary | ICD-10-CM | POA: Diagnosis not present

## 2017-05-15 DIAGNOSIS — E041 Nontoxic single thyroid nodule: Secondary | ICD-10-CM | POA: Diagnosis not present

## 2017-05-15 DIAGNOSIS — Z23 Encounter for immunization: Secondary | ICD-10-CM | POA: Diagnosis not present

## 2017-05-15 DIAGNOSIS — Z794 Long term (current) use of insulin: Secondary | ICD-10-CM | POA: Diagnosis not present

## 2017-05-15 NOTE — Patient Instructions (Addendum)
Take 4 R in am and add protein at Bfst  More sugars at 8-9 pm and use 8-9 R at dinner if sugars >180  Take 2 Lovaza daily

## 2017-05-15 NOTE — Progress Notes (Signed)
Patient ID: Ray Merritt, male   DOB: 22-Jan-1936, 81 y.o.   MRN: [id number]           Reason for Appointment: Follow-up for Type 2 Diabetes   History of Present Illness:          Date of diagnosis of type 2 diabetes mellitus : 1982        Background history:   He has been on insulin since the time of diagnosis, initially his blood sugars were significantly high ; he was symptomatic with dry mouth and blurred vision. He thinks he was probably taking NPH and Regular Insulin for quite some time and before his consultation was only on NPH 3 years ago he was put on Bydureon weekly in addition to NPH.  He thinks that it initially curb his appetite but he does not think he lost any significant amount of weight.  Also he does not think his blood sugar control was much better For about a year he had been switched from Bydureon to Tanzeum weekly  His  Tanzeum was stopped because of lack of benefit A1c has been as high as 11.8 in 2/16  Recent history:   INSULIN regimen is : Novolin NPH 10 units acb and 26 units bedtime, Regular Insulin 4/6 units at breakfast and 7 supper   On his initial consultation because of high postprandial readings he was started on Regular Insulin before each meal   A1c is about the same at 7.6 now even though his blood sugars are lower at home However has been as low as 6.6 in the past  Current blood sugar patterns and problems identified:  His blood sugars have been monitored mostly before lunch and  supper  FASTING readings are being checked only rarely again and has only one recent glucose  However lab fasting glucose was 161  He was told to cut back on his regular insulin in the morning at least 2 units when he was planning to be active with going shopping in the morning but he still tends to get occasional low sugars, recently has 3 documented low sugar  Once his low sugar  was related to his being late for lunch  Blood sugars after supper are very  consistent and well-controlled  Blood sugars are somewhat higher after breakfast possibly when he does not have enough protein at his meal with oatmeal  He is not doing a lot of programmed exercise but his weight is about the same  Other hypoglycemic drugs the patient is taking are:   none Side effects from medications have been: None  Compliance with the medical regimen: Good Hypoglycemia: with activity symptoms are weakness and feeling sweaty, treated with juice or other sweet drinks, usually can recognize blood sugars in the low 60s   Glucose monitoring:  done up to 3 times a day         Glucometer:  Contour      Blood Glucose readings by  review of his monitor  Mean values apply above for all meters except median for One Touch  PRE-MEAL Fasting Midday  Dinner Bedtime Overall  Glucose range: 121  56-277   81-1 79     Mean/median:  129   112 122   POST-MEAL PC Breakfast PC Lunch PC Dinner  Glucose range:     Mean/median:       Mean values apply above for all meters except median for One Touch  PRE-MEAL Fasting Lunch Dinner Bedtime Overall  Glucose range: 126-141  57-248      Mean/median:  129    141   POST-MEAL PC Breakfast PC Lunch PC Dinner  Glucose range:   92-229   Mean/median:   146   Self-care: The diet that the patient has been following is: tries to limit portions .     Typical meal intake: Breakfast is old-fashioned oatmeal at 10 am, lunch is usually a sandwich.  He has mostly popcorn for snacks   Dinner time is usually 6-7              Dietician visit, most recent: none  CDE consultation: 7/16               Exercise: Some walking, mostly when going shopping  Weight history:  Wt Readings from Last 3 Encounters:  05/15/17 232 lb 12.8 oz (105.6 kg)  04/28/17 231 lb (104.8 kg)  04/22/17 233 lb (105.7 kg)    Glycemic control:   Lab Results  Component Value Date   HGBA1C 7.6 (H) 05/12/2017   HGBA1C 7.6 (H) 02/09/2017   HGBA1C 7.3 (H) 11/10/2016   Lab  Results  Component Value Date   MICROALBUR 39.9 (H) 05/12/2016   LDLCALC 83 05/12/2016   CREATININE 3.4 ([place]) 03/10/2017      Allergies as of 05/15/2017   No Known Allergies     Medication List       Accurate as of 05/15/17 11:06 AM. Always use your most recent med list.          ALPHAGAN P 0.1 % Soln Generic drug:  brimonidine Place 1 drop into both eyes 2 (two) times daily.   aspirin EC 81 MG tablet Take 81 mg by mouth at bedtime.   atorvastatin 20 MG tablet Commonly known as:  LIPITOR Take 20 mg by mouth daily.   carvedilol 25 MG tablet Commonly known as:  COREG Take 25 mg by mouth 2 (two) times daily.   cholecalciferol 1000 units tablet Commonly known as:  VITAMIN D Take 1,000 Units by mouth daily.   cloNIDine 0.1 MG tablet Commonly known as:  CATAPRES Take 0.1 mg by mouth 2 (two) times daily.   diphenhydramine-acetaminophen 25-500 MG Tabs tablet Commonly known as:  TYLENOL PM Take 2 tablets by mouth at bedtime.   ferrous sulfate 325 (65 FE) MG tablet Take 325 mg by mouth 2 (two) times daily.   finasteride 5 MG tablet Commonly known as:  PROSCAR Take 5 mg by mouth every evening.   furosemide 80 MG tablet Commonly known as:  LASIX Take 80 mg by mouth daily.   hydrALAZINE 50 MG tablet Commonly known as:  APRESOLINE Take 50 mg by mouth 3 (three) times daily.   insulin NPH Human 100 UNIT/ML injection Commonly known as:  HUMULIN N,NOVOLIN N Inject 10-26 Units into the skin 2 (two) times daily. Pt uses 10 units in the morning and 26 units in the evening.   insulin regular 100 units/mL injection Commonly known as:  NOVOLIN R,HUMULIN R Inject 6-7 Units into the skin 2 (two) times daily. 6 units in the morning and 7 units at bedtime.   isosorbide mononitrate 120 MG 24 hr tablet Commonly known as:  IMDUR Take 120 mg by mouth daily.   multivitamin with minerals Tabs tablet Take 1 tablet by mouth daily.   tamsulosin 0.4 MG Caps capsule Commonly  known as:  FLOMAX Take 0.4 mg by mouth daily after supper.   Travoprost (BAK Free) 0.004 %  Soln ophthalmic solution Commonly known as:  TRAVATAN Place 1 drop into both eyes at bedtime.   triamcinolone cream 0.1 % Commonly known as:  KENALOG Apply 1 application topically 2 (two) times daily as needed (for itching/irritation).   vitamin C 1000 MG tablet Take 1,000 mg by mouth daily.       Allergies: No Known Allergies  Past Medical History:  Diagnosis Date  . Anemia   . Arthritis   . Cancer ([place]) 2015-present   CLL  . Chronic kidney disease   . Coronary artery disease 04/2016   1 stent  . Diabetes mellitus    insulin dependent   . Enlarged prostate   . Headache   . Hypertension   . Pneumonia     Past Surgical History:  Procedure Laterality Date  . AV FISTULA PLACEMENT Left 04/28/2017   Procedure: Left arm ARTERIOVENOUS (AV) FISTULA CREATION;  Surgeon: Angelia Mould, MD;  Location: [facility];  Service: Vascular;  Laterality: Left;  . CARDIAC CATHETERIZATION N/A 05/01/2015   Procedure: Left Heart Cath and Coronary Angiography;  Surgeon: Adrian Prows, MD;  Location: [place] [facility];  Service: Cardiovascular;  Laterality: N/A;  . CARDIAC CATHETERIZATION  05/01/2015   Procedure: Intravascular Pressure Wire/FFR Study;  Surgeon: Adrian Prows, MD;  Location: [place] [facility];  Service: Cardiovascular;;  . CARDIAC CATHETERIZATION N/A 05/22/2015   Procedure: Left Heart Cath and Coronary Angiography;  Surgeon: Adrian Prows, MD;  Location: [place] [facility];  Service: Cardiovascular;  Laterality: N/A;  . CARDIAC CATHETERIZATION N/A 05/22/2015   Procedure: Coronary Stent Intervention;  Surgeon: Adrian Prows, MD;  Location: [place] [facility];  Service: Cardiovascular;  Laterality: N/A;  . COLONOSCOPY W/ BIOPSIES AND POLYPECTOMY    . SHOULDER SURGERY    . TONSILLECTOMY      Family History  Problem Relation Age of Onset  . Hypertension Mother   . Heart disease Sister   . Stroke  Sister   . Diabetes Brother     Social History:  reports that he has quit smoking. He has never used smokeless tobacco. He reports that he does not drink alcohol or use drugs.    Review of Systems   THYROID nodule: This is a copy of the previous note:  He has a dominant left-sided thyroid nodule and did have a biopsy done Results as follows: BENIGN FOLLICULAR CELLS, COLLOID AND MACROPHAGES, CONSISTENT WITH  BENIGN GOITROUS NODULE  Last TSH was normal   Lipid history: Has been treated with Lipitor, followed by PCP and cardiologist Previously his triglycerides have been excellent and was taking only 1 g Lovaza which was stopped However his triglycerides are significantly higher at 300 fasting LDL still below 70  Lab Results  Component Value Date   CHOL 129 05/12/2017   CHOL 147 05/12/2016   CHOL 137 06/05/2015   Lab Results  Component Value Date   HDL 27.30 (L) 05/12/2017   HDL 38.50 (L) 05/12/2016   HDL 39.60 06/05/2015   Lab Results  Component Value Date   LDLCALC 83 05/12/2016   LDLCALC 79 06/05/2015   [place] 87 08/21/2014   Lab Results  Component Value Date   TRIG 300.0 (H) 05/12/2017   TRIG 127.0 05/12/2016   TRIG 91.0 06/05/2015   Lab Results  Component Value Date   CHOLHDL 5 05/12/2017   CHOLHDL 4 05/12/2016   CHOLHDL 3 06/05/2015   Lab Results  Component Value Date   LDLDIRECT 60.0 05/12/2017  Eyes: .  Most recent eye exam was in 10/2016 and he is going regularly   Diabetic foot exam done in 10/17  RENAL dysfunction: He is followed by nephrologist No recent labs available     Lab Results  Component Value Date   CREATININE 3.4 (HH) 03/10/2017   CREATININE 3.43 (H) 02/09/2017   CREATININE 3.98 (H) 01/27/2017     Physical Examination:  BP (!) 142/84   Pulse 73   Ht 6\' 3"  (1.905 m)   Wt 232 lb 12.8 oz (105.6 kg)   SpO2 97%   BMI 29.10 kg/m        ASSESSMENT:  Diabetes type 2, uncontrolled    See history of  present illness for detailed discussion of his current management, blood sugar patterns and problems identified  Although his A1c is still relatively high at 7.6 his home blood sugars averaging Only 122 Most of his blood sugars are before lunch and supper Not clear if his fasting readings may be higher since he has a reading of 161 in the lab May be getting higher readings after supper which he is not monitoring also His low sugars are tending to be mostly when he is out shopping and more active than usual Also not clear why some of his readings after breakfast are significantly high Also not getting protein at breakfast usually  Usually doing well on diet and has lost some weight  CKD: Continue to follow-up with nephrologist and review records when available  LIPIDS: He has had fairly good lipids and discussed that he probably does not need to take Lovaza 1 g daily since his triglycerides are excellent and this is a subtherapeutic dose  HYPERTENSION: Appears well-controlled  PLAN:   He needs to add protein consistently at breakfast with his oatmeal and discussed various options  No change in basic insulin regimen   If his sugars after supper are consistently high even need to at have 8 or 9 units of regular insulin and discussed how to adjust this based on postprandial targets  He can keep his regular insulin 4 units daily basis  Periodically checks fasting readings also to adjust bedtime NPH  To try and check sugars more often after lunch, he does need to rotate when he is checking his blood sugar  Follow-up in 3 months again  HYPERLIPIDEMIA: His triglycerides are high and he needs to go back to taking Lovaza and increase the dose to at least 2 capsules daily  Patient Instructions  Take 4 R in am and add protein at Bfst  More sugars at 8-9 pm and use 8-8 R at dinner if sugars >180  Counseling time on subjects discussed in assessment and plan sections is over 50% of today's  25 minute visit   Influenza vaccine given  [facility] 05/15/2017, 11:06 AM   Note: This office note was prepared with Estate agent. Any transcriptional errors that result from this process are unintentional.

## 2017-05-20 IMAGING — CR DG CHEST 2V
2 series · 2 of 2 positions shown · non-contrast
Comparison: 12/10/2015, 03/17/2000

CLINICAL DATA: 80-year-old male complains of shortness of breath,
cough and emesis

EXAM:
CHEST  2 VIEW

[w chest lat]
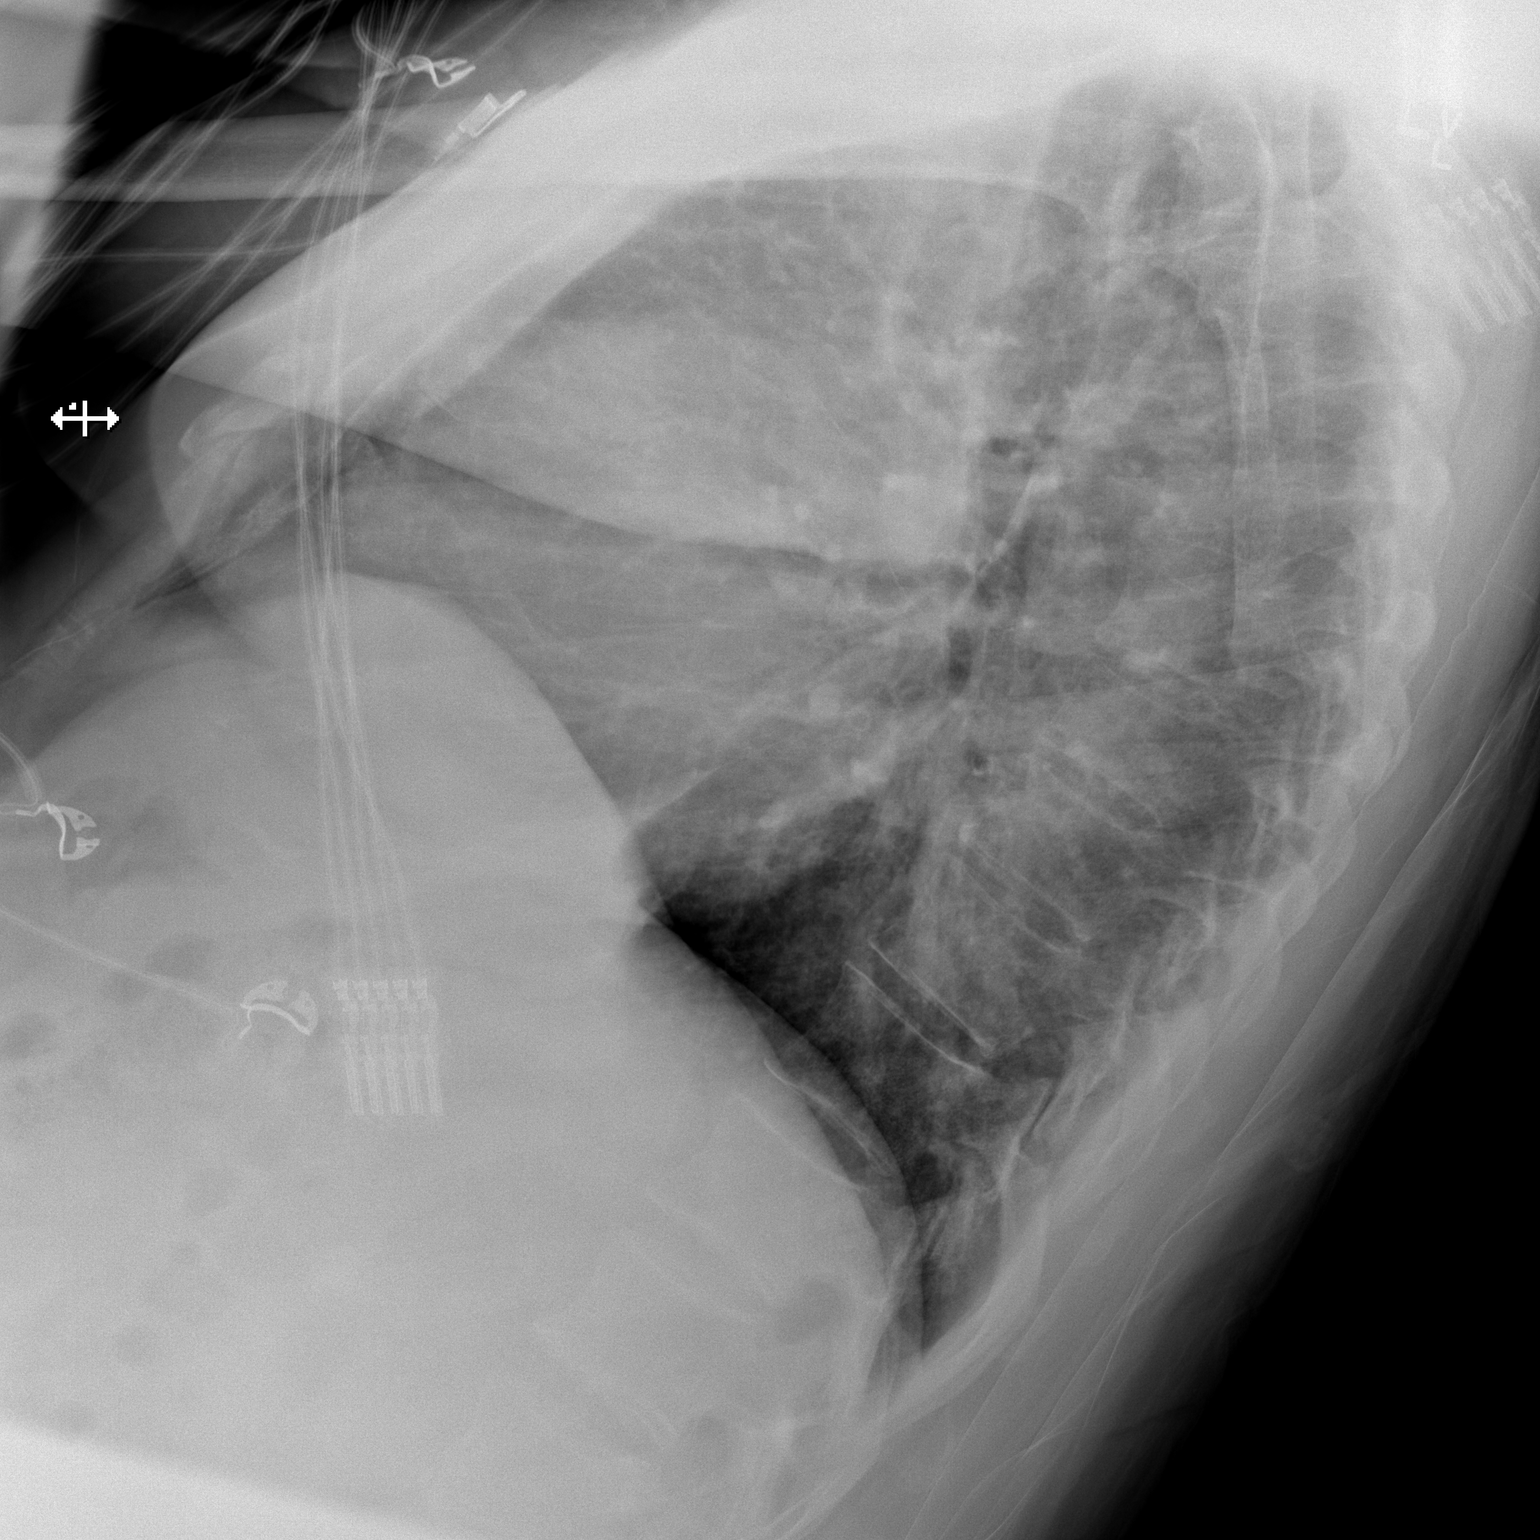

[x chest ap]
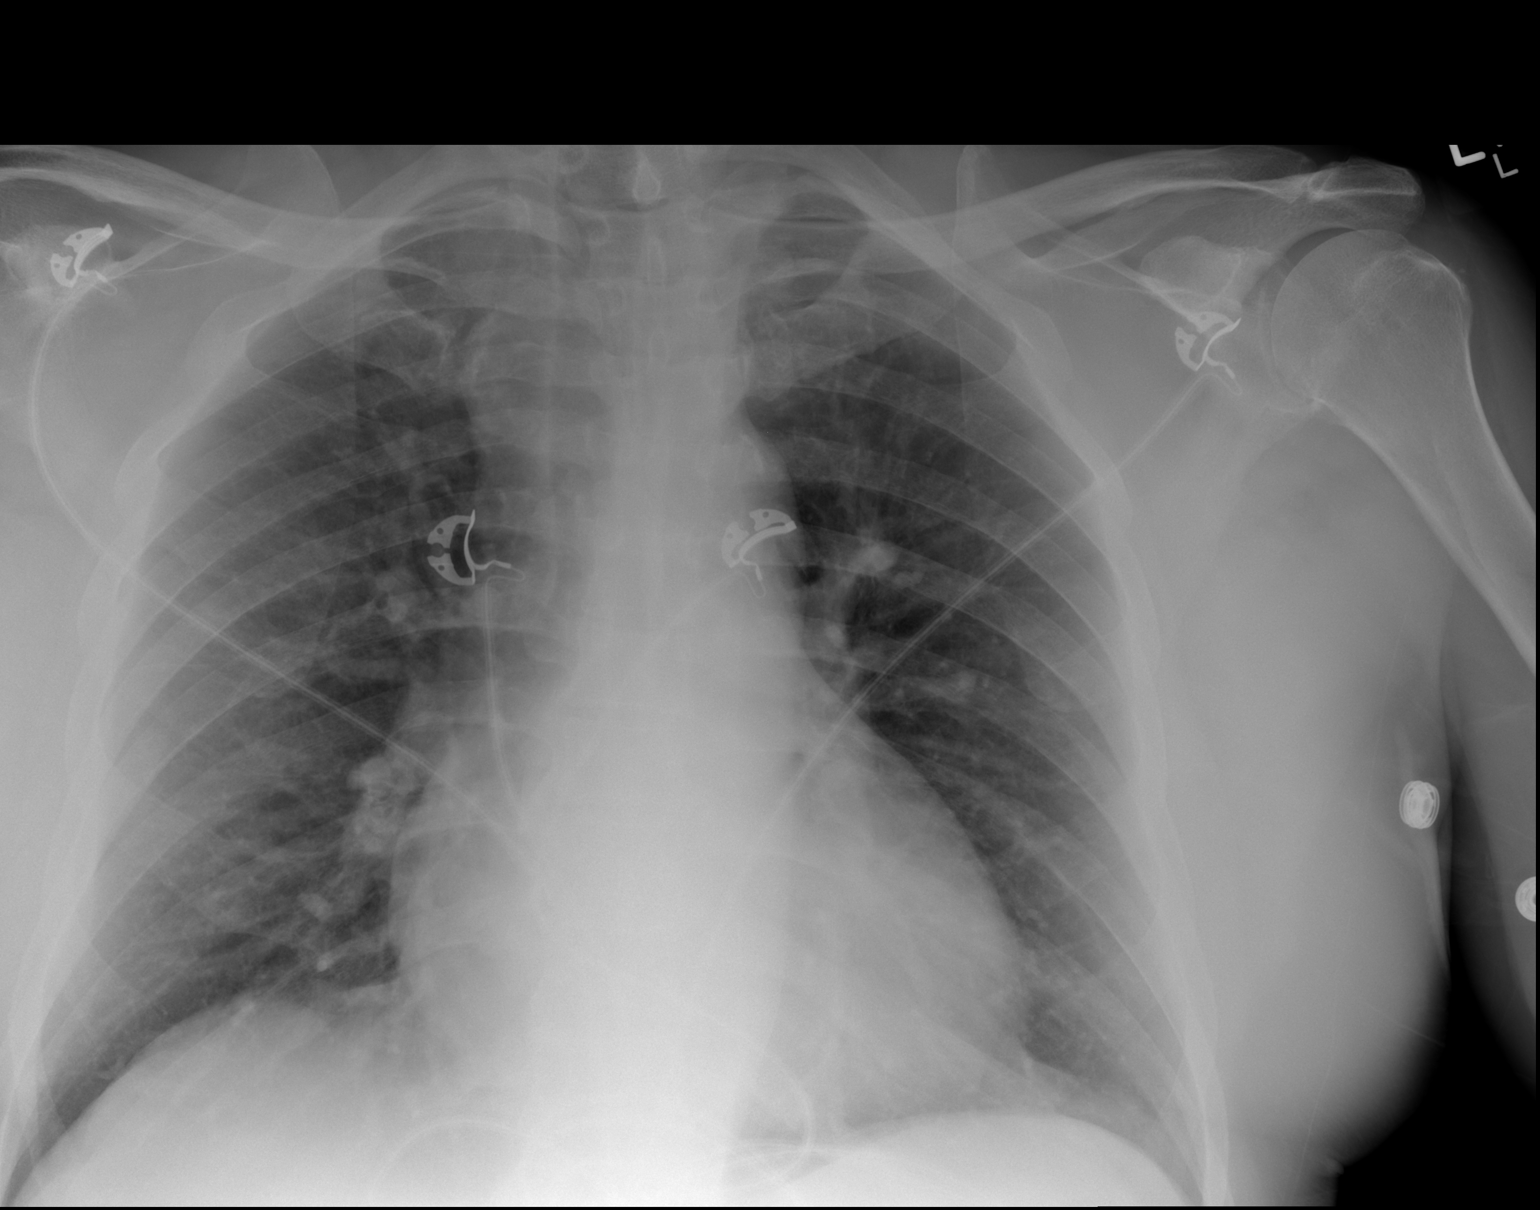

[2 of 2 positions shown; findings below may reference images not displayed]

FINDINGS: AP and lateral views of the chest. No acute consolidation or pleural
effusion identified. Borderline enlargement of the heart size.
Suggestion of mild central vascular congestion. No overt edema.
Atherosclerosis of the aortic arch. No pneumothorax.
IMPRESSION: 1. No acute pulmonary infiltrate or focal consolidation identified
2. Borderline enlargement of heart size with suggestion of mild
central vascular congestion.

## 2017-05-21 DIAGNOSIS — N183 Chronic kidney disease, stage 3 (moderate): Secondary | ICD-10-CM | POA: Diagnosis not present

## 2017-05-21 DIAGNOSIS — D638 Anemia in other chronic diseases classified elsewhere: Secondary | ICD-10-CM | POA: Diagnosis not present

## 2017-05-21 DIAGNOSIS — Z794 Long term (current) use of insulin: Secondary | ICD-10-CM | POA: Diagnosis not present

## 2017-05-21 DIAGNOSIS — Z9889 Other specified postprocedural states: Secondary | ICD-10-CM | POA: Diagnosis not present

## 2017-05-21 DIAGNOSIS — C9000 Multiple myeloma not having achieved remission: Secondary | ICD-10-CM | POA: Diagnosis not present

## 2017-05-21 DIAGNOSIS — E1122 Type 2 diabetes mellitus with diabetic chronic kidney disease: Secondary | ICD-10-CM | POA: Diagnosis not present

## 2017-05-21 DIAGNOSIS — Z136 Encounter for screening for cardiovascular disorders: Secondary | ICD-10-CM | POA: Diagnosis not present

## 2017-05-21 DIAGNOSIS — E785 Hyperlipidemia, unspecified: Secondary | ICD-10-CM | POA: Diagnosis not present

## 2017-05-21 DIAGNOSIS — I2510 Atherosclerotic heart disease of native coronary artery without angina pectoris: Secondary | ICD-10-CM | POA: Diagnosis not present

## 2017-05-21 DIAGNOSIS — I10 Essential (primary) hypertension: Secondary | ICD-10-CM | POA: Diagnosis not present

## 2017-05-21 DIAGNOSIS — Z0000 Encounter for general adult medical examination without abnormal findings: Secondary | ICD-10-CM | POA: Diagnosis not present

## 2017-05-21 DIAGNOSIS — Z125 Encounter for screening for malignant neoplasm of prostate: Secondary | ICD-10-CM | POA: Diagnosis not present

## 2017-05-21 DIAGNOSIS — Z23 Encounter for immunization: Secondary | ICD-10-CM | POA: Diagnosis not present

## 2017-06-07 ENCOUNTER — Other Ambulatory Visit: Payer: Self-pay

## 2017-06-07 ENCOUNTER — Emergency Department (HOSPITAL_COMMUNITY): Payer: Medicare Other

## 2017-06-07 ENCOUNTER — Encounter (HOSPITAL_COMMUNITY): Payer: Self-pay | Admitting: *Deleted

## 2017-06-07 ENCOUNTER — Emergency Department (HOSPITAL_COMMUNITY)
Admission: EM | Admit: 2017-06-07 | Discharge: 2017-06-08 | Disposition: A | Discharge status: Home / Self Care | Payer: Medicare Other | Attending: Emergency Medicine | Admitting: Emergency Medicine

## 2017-06-07 DIAGNOSIS — R51 Headache: Secondary | ICD-10-CM | POA: Diagnosis not present

## 2017-06-07 DIAGNOSIS — I129 Hypertensive chronic kidney disease with stage 1 through stage 4 chronic kidney disease, or unspecified chronic kidney disease: Secondary | ICD-10-CM | POA: Diagnosis not present

## 2017-06-07 DIAGNOSIS — Z79899 Other long term (current) drug therapy: Secondary | ICD-10-CM | POA: Insufficient documentation

## 2017-06-07 DIAGNOSIS — E041 Nontoxic single thyroid nodule: Secondary | ICD-10-CM | POA: Diagnosis not present

## 2017-06-07 DIAGNOSIS — Z87891 Personal history of nicotine dependence: Secondary | ICD-10-CM | POA: Insufficient documentation

## 2017-06-07 DIAGNOSIS — I2510 Atherosclerotic heart disease of native coronary artery without angina pectoris: Secondary | ICD-10-CM | POA: Diagnosis not present

## 2017-06-07 DIAGNOSIS — Z7982 Long term (current) use of aspirin: Secondary | ICD-10-CM | POA: Diagnosis not present

## 2017-06-07 DIAGNOSIS — M436 Torticollis: Secondary | ICD-10-CM | POA: Diagnosis not present

## 2017-06-07 DIAGNOSIS — R531 Weakness: Secondary | ICD-10-CM | POA: Diagnosis not present

## 2017-06-07 DIAGNOSIS — M2560 Stiffness of unspecified joint, not elsewhere classified: Secondary | ICD-10-CM | POA: Diagnosis not present

## 2017-06-07 DIAGNOSIS — N189 Chronic kidney disease, unspecified: Secondary | ICD-10-CM | POA: Insufficient documentation

## 2017-06-07 DIAGNOSIS — E119 Type 2 diabetes mellitus without complications: Secondary | ICD-10-CM | POA: Insufficient documentation

## 2017-06-07 DIAGNOSIS — R4020 Unspecified coma: Secondary | ICD-10-CM | POA: Diagnosis not present

## 2017-06-07 DIAGNOSIS — Z794 Long term (current) use of insulin: Secondary | ICD-10-CM | POA: Diagnosis not present

## 2017-06-07 LAB — GLUCOSE, CSF: Glucose, CSF: 108 mg/dL — ABNORMAL HIGH (ref 40–70)

## 2017-06-07 LAB — URINALYSIS, ROUTINE W REFLEX MICROSCOPIC
Bilirubin Urine: NEGATIVE
GLUCOSE, UA: NEGATIVE mg/dL
Hgb urine dipstick: NEGATIVE
KETONES UR: NEGATIVE mg/dL
LEUKOCYTES UA: NEGATIVE
NITRITE: NEGATIVE
PROTEIN: NEGATIVE mg/dL
Specific Gravity, Urine: 1.009 (ref 1.005–1.030)
pH: 5 (ref 5.0–8.0)

## 2017-06-07 LAB — CBC
HCT: 32.3 % — ABNORMAL LOW (ref 39.0–52.0)
Hemoglobin: 10.8 g/dL — ABNORMAL LOW (ref 13.0–17.0)
MCH: 29.8 pg (ref 26.0–34.0)
MCHC: 33.4 g/dL (ref 30.0–36.0)
MCV: 89.2 fL (ref 78.0–100.0)
PLATELETS: 255 10*3/uL (ref 150–400)
RBC: 3.62 MIL/uL — ABNORMAL LOW (ref 4.22–5.81)
RDW: 13.9 % (ref 11.5–15.5)
WBC: 17.1 10*3/uL — AB (ref 4.0–10.5)

## 2017-06-07 LAB — BASIC METABOLIC PANEL
Anion gap: 11 (ref 5–15)
BUN: 98 mg/dL — AB (ref 6–20)
CALCIUM: 10.1 mg/dL (ref 8.9–10.3)
CHLORIDE: 107 mmol/L (ref 101–111)
CO2: 21 mmol/L — AB (ref 22–32)
CREATININE: 3.85 mg/dL — AB (ref 0.61–1.24)
GFR calc non Af Amer: 14 mL/min — ABNORMAL LOW (ref >60)
GFR, EST AFRICAN AMERICAN: 16 mL/min — AB (ref >60)
Glucose, Bld: 242 mg/dL — ABNORMAL HIGH (ref 65–99)
Potassium: 4.4 mmol/L (ref 3.5–5.1)
SODIUM: 139 mmol/L (ref 135–145)

## 2017-06-07 LAB — I-STAT CG4 LACTIC ACID, ED: Lactic Acid, Venous: 0.95 mmol/L (ref 0.5–1.9)

## 2017-06-07 LAB — CBG MONITORING, ED: GLUCOSE-CAPILLARY: 224 mg/dL — AB (ref 65–99)

## 2017-06-07 LAB — RAPID STREP SCREEN (MED CTR MEBANE ONLY): STREPTOCOCCUS, GROUP A SCREEN (DIRECT): NEGATIVE

## 2017-06-07 LAB — PROTEIN, CSF: Total  Protein, CSF: 60 mg/dL — ABNORMAL HIGH (ref 15–45)

## 2017-06-07 MED ORDER — VANCOMYCIN HCL IN DEXTROSE 1-5 GM/200ML-% IV SOLN
1000.0000 mg | Freq: Once | INTRAVENOUS | Status: AC
  Administered 2017-06-07: 1000 mg via INTRAVENOUS
  Filled 2017-06-07: qty 200

## 2017-06-07 MED ORDER — LIDOCAINE HCL 1 % IJ SOLN
INTRAMUSCULAR | Status: AC
  Administered 2017-06-07: 20:00:00
  Filled 2017-06-07: qty 20

## 2017-06-07 MED ORDER — SODIUM CHLORIDE 0.9 % IV BOLUS (SEPSIS)
1000.0000 mL | Freq: Once | INTRAVENOUS | Status: AC
  Administered 2017-06-07: 1000 mL via INTRAVENOUS

## 2017-06-07 MED ORDER — MORPHINE SULFATE (PF) 4 MG/ML IV SOLN
4.0000 mg | Freq: Once | INTRAVENOUS | Status: AC
  Administered 2017-06-07: 4 mg via INTRAVENOUS
  Filled 2017-06-07: qty 1

## 2017-06-07 MED ORDER — DEXTROSE 5 % IV SOLN
2.0000 g | Freq: Once | INTRAVENOUS | Status: AC
  Administered 2017-06-07: 2 g via INTRAVENOUS
  Filled 2017-06-07: qty 2

## 2017-06-07 MED ORDER — LIDOCAINE HCL 2 % IJ SOLN
10.0000 mL | Freq: Once | INTRAMUSCULAR | Status: DC
  Filled 2017-06-07: qty 10

## 2017-06-07 MED ORDER — LIDOCAINE HCL (PF) 1 % IJ SOLN
20.0000 mL | Freq: Once | INTRAMUSCULAR | Status: AC
  Administered 2017-06-07: 20 mL

## 2017-06-07 NOTE — ED Triage Notes (Signed)
Pt states developed soreness in neck Friday and has become worse, generalized weakness, some nausea, slightly labored breathing, has tried heat back to areas without relief

## 2017-06-07 NOTE — ED Notes (Signed)
Pt. Documented in error Saline Lock IV, Maintain IV access. 

## 2017-06-07 NOTE — Procedures (Signed)
Uncomplicated LP at R1/7.  7 cc clear CSF collected.    JWatts MD

## 2017-06-07 NOTE — ED Notes (Signed)
Lumbar Tap by EDP and PA unsuccessful at this time.

## 2017-06-07 NOTE — ED Provider Notes (Signed)
[facility] DEPT Provider Note   CSN: [id number] Arrival date & time: 06/07/17  1353     History   Chief Complaint Chief Complaint  Patient presents with  . Neck Pain  . Fatigue    HPI Ray Merritt is a 81 y.o. male presenting with neck stiffness.  Patient states starting Friday evening, he had neck stiffness.  This has persisted and worsened, and today he started to develop a headache.  He has tried heating pads without improvement of pain.  Neck hurts with any movement.  He denies fevers, chills, vision changes, confusion, slurred speech, or neurologic deficit.  He reports productive cough for the past week.  He has a history of CLL, for which he follows up with his oncologist, he is not currently getting any treatment.  He was in the hospital last month for AV fistula, and he follows up with his doctor to start dialysis next month.  He is not currently getting any dialysis.  He denies nasal congestion, sore throat, nausea, vomiting, abdominal pain, urinary symptoms, abnormal bowel movements, or leg pain or swelling.  He denies sick contacts.  He has not been around anyone with known meningitis.  HPI  Past Medical History:  Diagnosis Date  . Anemia   . Arthritis   . Cancer ([place]) 2015-present   CLL  . Chronic kidney disease   . Coronary artery disease 04/2016   1 stent  . Diabetes mellitus    insulin dependent   . Enlarged prostate   . Headache   . Hypertension   . Pneumonia     Patient Active Problem List   Diagnosis Date Noted  . Fever of unknown origin 08/08/2016  . Dyspnea   . Acute kidney injury superimposed on chronic kidney disease ([place]) 04/02/2016  . Sepsis ([place]) 04/01/2016  . Type 2 diabetes, HbA1c goal < 7% (HCC)   . Fever 12/10/2015  . BPH (benign prostatic hyperplasia) 12/10/2015  . CAD (coronary artery disease), native coronary artery 05/21/2015  . Coronary artery disease involving coronary bypass graft 04/30/2015  .  Angina pectoris associated with type 2 diabetes mellitus ([place]) 04/29/2015  . Type II diabetes mellitus, uncontrolled ([place]) 01/25/2015  . Chronic kidney disease (CKD) 01/25/2015  . Hypertension, essential, benign 01/25/2015  . Hyperlipidemia 01/25/2015  . CLL (chronic lymphocytic leukemia) ([place]) 03/02/2014  . Leukocytosis 01/27/2014    Past Surgical History:  Procedure Laterality Date  . COLONOSCOPY W/ BIOPSIES AND POLYPECTOMY    . Coronary Stent Intervention N/A 05/22/2015   Performed by Adrian Prows, MD at [facility]  . Intravascular Pressure Wire/FFR Study  05/01/2015   Performed by Adrian Prows, MD at [facility]  . Left arm ARTERIOVENOUS (AV) FISTULA CREATION Left 04/28/2017   Performed by Angelia Mould, MD at [facility]  . Left Heart Cath and Coronary Angiography N/A 05/22/2015   Performed by Adrian Prows, MD at [facility]  . Left Heart Cath and Coronary Angiography N/A 05/01/2015   Performed by Adrian Prows, MD at [facility]  . SHOULDER SURGERY    . TONSILLECTOMY         Home Medications    Prior to Admission medications   Medication Sig Start Date End Date Taking? Authorizing Provider  ALPHAGAN P 0.1 % SOLN Place 2 drops 2 (two) times daily into both eyes.    Yes [provider]  Ascorbic Acid (VITAMIN C) 1000 MG tablet Take  1,000 mg by mouth daily.   Yes [provider]  aspirin EC 81 MG tablet Take 81 mg by mouth at bedtime.    Yes [provider]  atorvastatin (LIPITOR) 20 MG tablet Take 20 mg by mouth daily.   Yes [provider]  carvedilol (COREG) 25 MG tablet Take 25 mg by mouth 2 (two) times daily.    Yes [provider]  cholecalciferol (VITAMIN D) 1000 units tablet Take 1,000 Units by mouth daily.    Yes [provider]  cloNIDine (CATAPRES) 0.1 MG tablet Take 0.1 mg by mouth 2 (two) times daily.   Yes [provider]  diphenhydramine-acetaminophen (TYLENOL PM) 25-500 MG  TABS tablet Take 2 tablets by mouth at bedtime.   Yes [provider]  ferrous sulfate 325 (65 FE) MG tablet Take 325 mg by mouth 2 (two) times daily.    Yes [provider]  finasteride (PROSCAR) 5 MG tablet Take 5 mg by mouth every evening.    Yes [provider]  furosemide (LASIX) 80 MG tablet Take 80 mg by mouth daily.   Yes [provider]  hydrALAZINE (APRESOLINE) 50 MG tablet Take 50 mg by mouth 3 (three) times daily.   Yes [provider]  insulin NPH Human (HUMULIN N,NOVOLIN N) 100 UNIT/ML injection Inject 10-26 Units into the skin 2 (two) times daily. Pt uses 10 units in the morning and 26 units in the evening.   Yes [provider]  insulin regular (NOVOLIN R,HUMULIN R) 100 units/mL injection Inject 6-7 Units into the skin 2 (two) times daily. 6 units in the morning and 7 units at bedtime.   Yes [provider]  isosorbide mononitrate (IMDUR) 120 MG 24 hr tablet Take 120 mg by mouth daily.   Yes [provider]  Multiple Vitamin (MULTIVITAMIN WITH MINERALS) TABS tablet Take 1 tablet by mouth daily.   Yes [provider]  tamsulosin (FLOMAX) 0.4 MG CAPS capsule Take 0.4 mg by mouth daily after supper.    Yes [provider]  Travoprost, BAK Free, (TRAVATAN) 0.004 % SOLN ophthalmic solution Place 1 drop into both eyes at bedtime.   Yes [provider]  triamcinolone cream (KENALOG) 0.1 % Apply 1 application topically 2 (two) times daily as needed (for itching/irritation).    Yes [provider]    Family History Family History  Problem Relation Age of Onset  . Hypertension Mother   . Heart disease Sister   . Stroke Sister   . Diabetes Brother     Social History Social History   Tobacco Use  . Smoking status: Former Research scientist (life sciences)  . Smokeless tobacco: Never Used  . Tobacco comment: quit smoking in early 70's  Substance Use Topics  . Alcohol use: No  . Drug use: No      Allergies   Patient has no known allergies.   Review of Systems Review of Systems  Respiratory: Positive for cough.   Musculoskeletal: Positive for neck stiffness.  Neurological: Positive for headaches.  All other systems reviewed and are negative.   Physical Exam Updated Vital Signs BP (!) 166/69   Pulse 64   Temp (S) 99.6 F (37.6 C) (Rectal)   Resp 18   Ht 6\' 3"  (1.905 m)   Wt 104.3 kg (230 lb)   SpO2 99%   BMI 28.75 kg/m   Physical Exam  Constitutional: He is oriented to person, place, and time. He appears well-developed and well-nourished. No distress.  HENT:  Head: Normocephalic and atraumatic.  Mouth/Throat: Uvula is midline and oropharynx is clear and moist. Mucous membranes are dry.  Eyes: Conjunctivae and EOM are normal. Pupils are equal, round, and reactive to light.  Neck: No Kernig's sign noted.  Pain with all movements of the neck.   Cardiovascular: Normal rate, regular rhythm and intact distal pulses.  Pulmonary/Chest: Effort normal and breath sounds normal. No stridor. No respiratory distress. He has no wheezes. He has no rales. He exhibits no tenderness.  Abdominal: Soft. He exhibits no distension and no mass. There is no tenderness. There is no guarding.  Musculoskeletal: Normal range of motion.  Neurological: He is alert and oriented to person, place, and time. He has normal strength. No sensory deficit. GCS eye subscore is 4. GCS verbal subscore is 5. GCS motor subscore is 6.  Skin: Skin is warm and dry. No rash noted.  Psychiatric: He has a normal mood and affect.  Nursing note and vitals reviewed.    ED Treatments / Results  Labs (all labs ordered are listed, but only abnormal results are displayed) Labs Reviewed  BASIC METABOLIC PANEL - Abnormal; Notable for the following components:      Result Value   CO2 21 (*)    Glucose, Bld 242 (*)    BUN 98 (*)    Creatinine, Ser 3.85 (*)    GFR calc non Af Amer 14 (*)    GFR calc Af Amer 16  (*)    All other components within normal limits  CBC - Abnormal; Notable for the following components:   WBC 17.1 (*)    RBC 3.62 (*)    Hemoglobin 10.8 (*)    HCT 32.3 (*)    All other components within normal limits  URINALYSIS, ROUTINE W REFLEX MICROSCOPIC - Abnormal; Notable for the following components:   Color, Urine STRAW (*)    All other components within normal limits  CBG MONITORING, ED - Abnormal; Notable for the following components:   Glucose-Capillary 224 (*)    All other components within normal limits  RAPID STREP SCREEN (NOT AT [facility])  CULTURE, BLOOD (ROUTINE X 2)  CULTURE, BLOOD (ROUTINE X 2)  CULTURE, GROUP A STREP ([place])  CSF CULTURE  GRAM STAIN  GRAM STAIN  CSF CULTURE  CSF CELL COUNT WITH DIFFERENTIAL  CSF CELL COUNT WITH DIFFERENTIAL  HERPES SIMPLEX VIRUS(HSV) DNA BY PCR  GLUCOSE, CSF  PROTEIN, CSF  CSF CELL COUNT WITH DIFFERENTIAL  HERPES SIMPLEX VIRUS(HSV) DNA BY PCR  I-STAT CG4 LACTIC ACID, ED  I-STAT CG4 LACTIC ACID, ED    EKG  EKG Interpretation None       Radiology Dg Chest 2 View  Result Date: 06/07/2017 CLINICAL DATA:  Patient developed soreness in neck 2 days ago. Generalized weakness. Labored breathing. EXAM: CHEST  2 VIEW COMPARISON:  January 27, 2017 FINDINGS: The heart size and mediastinal contours are within normal limits. Both lungs are clear. The visualized skeletal structures are unremarkable. IMPRESSION: No active cardiopulmonary disease. Electronically Signed   By: Dorise Bullion III M.D   On: 06/07/2017 19:23   Ct Head Wo Contrast  Result Date: 06/07/2017 CLINICAL DATA:  81 y/o  M; altered level of consciousness. EXAM: CT HEAD WITHOUT CONTRAST TECHNIQUE: Contiguous axial images were obtained from the base of the skull through the vertex without intravenous contrast. COMPARISON:  None. FINDINGS: Brain: No evidence of acute infarction, hemorrhage, hydrocephalus, extra-axial collection or mass lesion/mass effect. Mild chronic  microvascular ischemic  changes and parenchymal volume loss of the brain. Vascular: Calcific atherosclerosis of carotid siphons. Skull: Normal. Negative for fracture or focal lesion. Sinuses/Orbits: No acute finding. Other: None. IMPRESSION: 1. No acute intracranial abnormality identified. 2. Mild chronic microvascular ischemic changes and parenchymal volume loss of the brain for age. Electronically Signed   By: Kristine Garbe M.D.   On: 06/07/2017 19:52   Ct Soft Tissue Neck Wo Contrast  Result Date: 06/07/2017 CLINICAL DATA:  Sore throat/stridor. Epiglottitis or tonsillitis suspected. Neck soreness beginning on Friday. EXAM: CT NECK WITHOUT CONTRAST TECHNIQUE: Multidetector CT imaging of the neck was performed following the standard protocol without intravenous contrast. COMPARISON:  Neck ultrasound 03/23/2015 FINDINGS: Pharynx and larynx: No evidence of inflammation or swelling. Salivary glands: No inflammation, mass, or stone. Thyroid: Left thyroid nodule measuring 2.5 x 2.7 cm in maximal span, matching sonography report from 03/23/2015. Lymph nodes: Mild prominence and rounding of posterior triangle lymph nodes without convincing pathologic enlargement. Vascular: Atherosclerotic calcification mainly seen at the carotid bifurcations and arch. Limited intracranial: Negative Visualized orbits: Negative Mastoids and visualized paranasal sinuses: Clear Skeleton: Diffuse degenerative disc narrowing and spondylosis. Multilevel facet arthropathy with mild C4-5 anterolisthesis. No acute or aggressive finding. Upper chest: No acute finding.  Few emphysematous spaces. IMPRESSION: 1. No noted inflammation in the neck.  Diffusely patent airway. 2. Few borderline and rounded cervical nodes which are nonspecific, likely incidental. 3. 2.7 cm left thyroid nodule with stable maximal dimensions compared to sonography in 2016. 4. Cervical disc and facet degeneration without acute superimposed finding. Electronically  Signed   By: Monte Fantasia M.D.   On: 06/07/2017 19:50   Dg Fluoro Guide Lumbar Puncture  Result Date: 06/07/2017 Gilford Silvius, MD     86/57/8469 62:95 PM Uncomplicated LP at M8/4.  7 cc clear CSF collected.  JWatts MD   Procedures .Lumbar Puncture Date/Time: 06/08/2017 12:56 PM Performed by: Franchot Heidelberg, PA-C Authorized by: Franchot Heidelberg, PA-C   Consent:    Consent obtained:  Verbal   Consent given by:  Patient   Risks discussed:  Bleeding, infection, nerve damage, pain, repeat procedure and headache Pre-procedure details:    Procedure purpose:  Diagnostic   Preparation: Patient was prepped and draped in usual sterile fashion   Anesthesia (see MAR for exact dosages):    Anesthesia method:  Local infiltration   Local anesthetic:  Lidocaine 1% w/o epi Procedure details:    Lumbar space:  L3-L4 interspace   Patient position:  Sitting   Needle gauge:  22   Needle length (in):  3.5   Ultrasound guidance: no     Number of attempts:  2 Post-procedure:    Puncture site:  Adhesive bandage applied and direct pressure applied   Patient tolerance of procedure:  Tolerated well, no immediate complications Comments:     LP performed with Allie Bossier, MD. No fluid obtained. No immediate complications.    (including critical care time)  Medications Ordered in ED Medications  vancomycin (VANCOCIN) IVPB 1000 mg/200 mL premix (not administered)  sodium chloride 0.9 % bolus 1,000 mL (0 mLs Intravenous Stopped 06/07/17 2243)  morphine 4 MG/ML injection 4 mg (4 mg Intravenous Given 06/07/17 1940)  lidocaine (XYLOCAINE) 1 % (with pres) injection (  Given by Other 06/07/17 2024)  lidocaine (PF) (XYLOCAINE) 1 % injection 20 mL (20 mLs Infiltration Given by Other 06/07/17 2024)  cefTRIAXone (ROCEPHIN) 2 g in dextrose 5 % 50 mL IVPB (2 g Intravenous New Bag/Given 06/07/17 2247)  morphine  4 MG/ML injection 4 mg (4 mg Intravenous Given 06/07/17 2253)     Initial Impression /  Assessment and Plan / ED Course  I have reviewed the triage vital signs and the nursing notes.  Pertinent labs & imaging results that were available during my care of the patient were reviewed by me and considered in my medical decision making (see chart for details).     Patient presenting for evaluation of neck stiffness and headache.  Physical exam shows patient who appears dry, and neck pain with all movements of his head.  Will obtain basic labs, UA, chest x-ray, and CT head and neck.   UA negative for infection.  Chest x-ray negative for pneumonia or other infiltrate.  CT head and neck negative for metastases, PTA, or RPA.  Leukocytosis of 17.1.  Patient with low-grade temp of 99.6 rectal.  Strep negative.  Lactic and blood cultures obtained, lactic normal.  Case discussed with attending, Dr. Darl Householder evaluated the patient.  LP attempted, and unsuccessful.  Contacted IR for fluoroscopy guided LP.  Spinal fluid sent for evaluation.  Patient signed out to J Ward, PA-C for further management and final disposition.  If fluid is negative for abnormality, likely muscular pain, and consider d/c with muscle relaxer.  If abnormal, consider admit for IV antibiotics due to leukocytosis and neck stiffness.   Final Clinical Impressions(s) / ED Diagnoses   Final diagnoses:  Neck stiffness    ED Discharge Orders    None       Franchot Heidelberg, PA-C 06/07/17 [place], Jonnelle [place], PA-C 06/08/17 1258    Drenda Freeze, MD 06/10/17 [phone]

## 2017-06-07 NOTE — ED Provider Notes (Signed)
Care assumed from previous provider PA Caccavale. Please see note for further details. Case discussed, plan agreed upon. Briefly, patient is a 81 y.o. male who presents to ED for neck stiffness Friday and headache beginning today. No focal neuro deficits on exam. LP attempted in ED unsuccessful. Patient awaiting LP under fluoro guidance. If LP with signs of infection, will admit. If negative, likely d/c home with muscle relaxants and PCP follow up.    LP results reviewed with attending, Dr. Sherry Ruffing. Reassuring. No signs of meningitis. Patient updated on results and plan of care. Feels comfortable with discharge to home with PCP follow up. Rx for low dose flexeril given. Return precautions discussed and all questions answered.    Ray Merritt, Ray Almond, PA-C 06/08/17 0126    [place], Gwenyth Allegra, MD 06/08/17 [phone]

## 2017-06-07 NOTE — ED Triage Notes (Signed)
Now complaing of headache

## 2017-06-08 LAB — CSF CELL COUNT WITH DIFFERENTIAL
RBC Count, CSF: 22 /mm3 — ABNORMAL HIGH
RBC Count, CSF: 12 /mm3 — ABNORMAL HIGH
TUBE #: 2
Tube #: 4
WBC CSF: 1 /mm3 (ref 0–5)
WBC, CSF: 0 /mm3 (ref 0–5)

## 2017-06-08 MED ORDER — CYCLOBENZAPRINE HCL 5 MG PO TABS: 5.0000 mg | ORAL_TABLET | Freq: Two times a day (BID) | ORAL | 0 refills | Status: DC | PRN

## 2017-06-08 NOTE — Discharge Instructions (Signed)
It was my pleasure taking care of you today!   Fortunately, your labs were very reassuring today.   Take muscle relaxer twice a day as needed for neck stiffness. You can also take Tylenol as needed for pain.   Please call your primary care doctor in the morning to schedule a follow up appointment.   Return to ER for fevers, new or worsening symptoms, any additional concerns.

## 2017-06-09 LAB — HERPES SIMPLEX VIRUS(HSV) DNA BY PCR
HSV 1 DNA: NEGATIVE
HSV 2 DNA: NEGATIVE

## 2017-06-10 DIAGNOSIS — E8770 Fluid overload, unspecified: Secondary | ICD-10-CM | POA: Diagnosis not present

## 2017-06-10 DIAGNOSIS — N184 Chronic kidney disease, stage 4 (severe): Secondary | ICD-10-CM | POA: Diagnosis not present

## 2017-06-10 DIAGNOSIS — C9190 Lymphoid leukemia, unspecified not having achieved remission: Secondary | ICD-10-CM | POA: Diagnosis not present

## 2017-06-10 DIAGNOSIS — I770 Arteriovenous fistula, acquired: Secondary | ICD-10-CM | POA: Diagnosis not present

## 2017-06-10 DIAGNOSIS — N2581 Secondary hyperparathyroidism of renal origin: Secondary | ICD-10-CM | POA: Diagnosis not present

## 2017-06-10 DIAGNOSIS — I129 Hypertensive chronic kidney disease with stage 1 through stage 4 chronic kidney disease, or unspecified chronic kidney disease: Secondary | ICD-10-CM | POA: Diagnosis not present

## 2017-06-10 LAB — CULTURE, GROUP A STREP (THRC)

## 2017-06-11 ENCOUNTER — Other Ambulatory Visit: Payer: Self-pay | Admitting: Endocrinology

## 2017-06-11 LAB — CSF CULTURE: SPECIAL REQUESTS: NORMAL

## 2017-06-11 LAB — CSF CULTURE W GRAM STAIN: Culture: NO GROWTH

## 2017-06-13 LAB — CULTURE, BLOOD (ROUTINE X 2)
CULTURE: NO GROWTH
Culture: NO GROWTH
SPECIAL REQUESTS: ADEQUATE
Special Requests: ADEQUATE

## 2017-06-16 DIAGNOSIS — N400 Enlarged prostate without lower urinary tract symptoms: Secondary | ICD-10-CM | POA: Diagnosis not present

## 2017-06-16 DIAGNOSIS — R972 Elevated prostate specific antigen [PSA]: Secondary | ICD-10-CM | POA: Diagnosis not present

## 2017-06-17 ENCOUNTER — Other Ambulatory Visit: Payer: Self-pay

## 2017-06-17 ENCOUNTER — Ambulatory Visit (HOSPITAL_COMMUNITY)
Admission: RE | Admit: 2017-06-17 | Discharge: 2017-06-17 | Disposition: A | Discharge status: Home / Self Care | Payer: Medicare Other | Source: Ambulatory Visit | Attending: Vascular Surgery | Admitting: Vascular Surgery

## 2017-06-17 ENCOUNTER — Ambulatory Visit (INDEPENDENT_AMBULATORY_CARE_PROVIDER_SITE_OTHER): Payer: Medicare Other | Admitting: Vascular Surgery

## 2017-06-17 ENCOUNTER — Encounter: Payer: Self-pay | Admitting: Vascular Surgery

## 2017-06-17 VITALS — BP 148/57 | HR 56 | Temp 97.2°F | Resp 18 | Ht 75.0 in | Wt 234.0 lb

## 2017-06-17 DIAGNOSIS — Z48812 Encounter for surgical aftercare following surgery on the circulatory system: Secondary | ICD-10-CM | POA: Insufficient documentation

## 2017-06-17 DIAGNOSIS — N185 Chronic kidney disease, stage 5: Secondary | ICD-10-CM

## 2017-06-17 NOTE — Progress Notes (Signed)
Vitals:   06/17/17 1232  BP: (!) 165/56  Pulse: (!) 56  Resp: 18  Temp: (!) 97.2 F (36.2 C)  TempSrc: Oral  SpO2: 100%  Weight: 234 lb (106.1 kg)  Height: 6\' 3"  (1.905 m)

## 2017-06-17 NOTE — Progress Notes (Signed)
Patient name: DEREKE NEUMANN MRN: [id number] DOB: Oct 03, 1935 Sex: male  REASON FOR VISIT:   Follow-up after AV fistula  HPI:   THOMAS MABRY is a pleasant 81 y.o. male who had a left radiocephalic fistula placed on 04/28/2017.  He comes in for a 6-week follow-up visit.  I reviewed his operative report.  The cephalic vein on the left into the into the basilic system.  I made the anastomosis higher up the wrist as the distal vein at the wrist was quite small.  The cephalic vein was noted to be 3.5 mm in diameter.  He has no complaints.  He denies pain or paresthesias in his left arm.  He is not on dialysis.  Current Outpatient Medications  Medication Sig Dispense Refill  . ALPHAGAN P 0.1 % SOLN Place 2 drops 2 (two) times daily into both eyes.   0  . Ascorbic Acid (VITAMIN C) 1000 MG tablet Take 1,000 mg by mouth daily.    Marland Kitchen aspirin EC 81 MG tablet Take 81 mg by mouth at bedtime.     Marland Kitchen atorvastatin (LIPITOR) 20 MG tablet Take 20 mg by mouth daily.  0  . carvedilol (COREG) 25 MG tablet Take 25 mg by mouth 2 (two) times daily.   0  . cholecalciferol (VITAMIN D) 1000 units tablet Take 1,000 Units by mouth daily.     . cloNIDine (CATAPRES) 0.1 MG tablet Take 0.1 mg by mouth 2 (two) times daily.  0  . cyclobenzaprine (FLEXERIL) 5 MG tablet Take 1 tablet (5 mg total) 2 (two) times daily as needed by mouth for muscle spasms. 20 tablet 0  . diphenhydramine-acetaminophen (TYLENOL PM) 25-500 MG TABS tablet Take 2 tablets by mouth at bedtime.    . ferrous sulfate 325 (65 FE) MG tablet Take 325 mg by mouth 2 (two) times daily.     . finasteride (PROSCAR) 5 MG tablet Take 5 mg by mouth every evening.     . furosemide (LASIX) 80 MG tablet Take 40 mg by mouth daily.   1  . hydrALAZINE (APRESOLINE) 50 MG tablet Take 50 mg by mouth 3 (three) times daily.  0  . insulin NPH Human (HUMULIN N,NOVOLIN N) 100 UNIT/ML injection Inject 10-26 Units into the skin 2 (two) times daily. Pt uses 10 units in the  morning and 26 units in the evening.    . insulin regular (NOVOLIN R,HUMULIN R) 100 units/mL injection Inject 6-7 Units into the skin 2 (two) times daily. 6 units in the morning and 7 units at bedtime.    . isosorbide mononitrate (IMDUR) 120 MG 24 hr tablet Take 120 mg by mouth daily.    . Multiple Vitamin (MULTIVITAMIN WITH MINERALS) TABS tablet Take 1 tablet by mouth daily.    Marland Kitchen NOVOLIN R 100 UNIT/ML injection INJECT 10 UNITS SUBCUTANEOUSLY EVERY MORNING BEFORE BREAKFAST 10 mL 3  . Omega-3 Fatty Acids (FISH OIL) 1000 MG CAPS Take 1,000 mg by mouth 2 (two) times daily.    . tamsulosin (FLOMAX) 0.4 MG CAPS capsule Take 0.4 mg by mouth daily after supper.     . Travoprost, BAK Free, (TRAVATAN) 0.004 % SOLN ophthalmic solution Place 1 drop into both eyes at bedtime.    . triamcinolone cream (KENALOG) 0.1 % Apply 1 application topically 2 (two) times daily as needed (for itching/irritation).   0   No current facility-administered medications for this visit.     REVIEW OF SYSTEMS:  [X]  denotes positive finding, [ ]   denotes negative finding Cardiac  Comments:  Chest pain or chest pressure:    Shortness of breath upon exertion:    Short of breath when lying flat:    Irregular heart rhythm:    Constitutional    Fever or chills:     PHYSICAL EXAM:   Vitals:   06/17/17 1232 06/17/17 1236  BP: (!) 165/56 (!) 148/57  Pulse: (!) 56 (!) 56  Resp: 18   Temp: (!) 97.2 F (36.2 C)   TempSrc: Oral   SpO2: 100%   Weight: 234 lb (106.1 kg)   Height: 6\' 3"  (1.905 m)     GENERAL: The patient is a well-nourished male, in no acute distress. The vital signs are documented above. CARDIOVASCULAR: There is a regular rate and rhythm. PULMONARY: There is good air exchange bilaterally without wheezing or rales. He has a good thrill in his left forearm fistula. He has a palpable left radial pulse.  DATA:   DUPLEX DIALYSIS FISTULA: I have independently interpreted the duplex of his dialysis fistula.   Diameters of the fistula range from 0.24-0.37 cm.  MEDICAL ISSUES:   STATUS POST LEFT RADIOCEPHALIC AV FISTULA: The fistula has a good thrill but is not adequate in size at this point.  It is only been 6 weeks.  He is not on dialysis.  I ordered a follow-up duplex scan in 2 months and I will see him back at that time.  Hopefully this will continue to gradually enlarge and provide adequate access if it is needed.  Deitra Mayo [facility] [phone]

## 2017-08-05 DIAGNOSIS — H401131 Primary open-angle glaucoma, bilateral, mild stage: Secondary | ICD-10-CM | POA: Diagnosis not present

## 2017-08-17 ENCOUNTER — Other Ambulatory Visit (INDEPENDENT_AMBULATORY_CARE_PROVIDER_SITE_OTHER): Payer: Medicare Other

## 2017-08-17 DIAGNOSIS — E1165 Type 2 diabetes mellitus with hyperglycemia: Secondary | ICD-10-CM

## 2017-08-17 DIAGNOSIS — E782 Mixed hyperlipidemia: Secondary | ICD-10-CM

## 2017-08-17 DIAGNOSIS — E041 Nontoxic single thyroid nodule: Secondary | ICD-10-CM

## 2017-08-17 DIAGNOSIS — Z794 Long term (current) use of insulin: Secondary | ICD-10-CM

## 2017-08-17 LAB — COMPREHENSIVE METABOLIC PANEL
ALBUMIN: 4 g/dL (ref 3.5–5.2)
ALT: 14 U/L (ref 0–53)
AST: 13 U/L (ref 0–37)
Alkaline Phosphatase: 53 U/L (ref 39–117)
BUN: 80 mg/dL — AB (ref 6–23)
CHLORIDE: 109 meq/L (ref 96–112)
CO2: 18 mEq/L — ABNORMAL LOW (ref 19–32)
CREATININE: 3.27 mg/dL — AB (ref 0.40–1.50)
Calcium: 9.1 mg/dL (ref 8.4–10.5)
GFR: 19.49 mL/min — ABNORMAL LOW (ref >60.00)
GLUCOSE: 147 mg/dL — AB (ref 70–99)
Potassium: 4 mEq/L (ref 3.5–5.1)
SODIUM: 138 meq/L (ref 135–145)
Total Bilirubin: 0.3 mg/dL (ref 0.2–1.2)
Total Protein: 6.5 g/dL (ref 6.0–8.3)

## 2017-08-17 LAB — LIPID PANEL
CHOL/HDL RATIO: 4
CHOLESTEROL: 117 mg/dL (ref 0–200)
HDL: 32 mg/dL — ABNORMAL LOW (ref >39.00)
LDL CALC: 65 mg/dL (ref 0–99)
NonHDL: 85.42
Triglycerides: 102 mg/dL (ref 0.0–149.0)
VLDL: 20.4 mg/dL (ref 0.0–40.0)

## 2017-08-17 LAB — TSH: TSH: 2.09 u[IU]/mL (ref 0.35–4.50)

## 2017-08-17 LAB — HEMOGLOBIN A1C: Hgb A1c MFr Bld: 7.2 % — ABNORMAL HIGH (ref 4.6–6.5)

## 2017-08-18 ENCOUNTER — Other Ambulatory Visit: Payer: Self-pay

## 2017-08-18 DIAGNOSIS — N189 Chronic kidney disease, unspecified: Secondary | ICD-10-CM

## 2017-08-18 DIAGNOSIS — N179 Acute kidney failure, unspecified: Secondary | ICD-10-CM

## 2017-08-19 ENCOUNTER — Ambulatory Visit (INDEPENDENT_AMBULATORY_CARE_PROVIDER_SITE_OTHER): Payer: Medicare Other | Admitting: Endocrinology

## 2017-08-19 ENCOUNTER — Encounter: Payer: Self-pay | Admitting: Endocrinology

## 2017-08-19 VITALS — BP 137/46 | HR 72 | Temp 97.8°F | Ht 75.0 in | Wt 239.0 lb

## 2017-08-19 DIAGNOSIS — Z794 Long term (current) use of insulin: Secondary | ICD-10-CM | POA: Diagnosis not present

## 2017-08-19 DIAGNOSIS — E1165 Type 2 diabetes mellitus with hyperglycemia: Secondary | ICD-10-CM | POA: Diagnosis not present

## 2017-08-19 NOTE — Progress Notes (Signed)
Patient ID: Ray Merritt, male   DOB: 1935-09-24, 82 y.o.   MRN: [id number]           Reason for Appointment: Follow-up for Type 2 Diabetes   History of Present Illness:          Date of diagnosis of type 2 diabetes mellitus : 1982        Background history:   He has been on insulin since the time of diagnosis, initially his blood sugars were significantly high ; he was symptomatic with dry mouth and blurred vision. He thinks he was probably taking NPH and Regular Insulin for quite some time and before his consultation was only on NPH 3 years ago he was put on Bydureon weekly in addition to NPH.  He thinks that it initially curb his appetite but he does not think he lost any significant amount of weight.  Also he does not think his blood sugar control was much better For about a year he had been switched from Bydureon to Tanzeum weekly  His  Tanzeum was stopped because of lack of benefit A1c has been as high as 11.8 in 2/16  Recent history:   INSULIN regimen is : Novolin NPH 10 units acb and 26 units bedtime, Regular Insulin 4-5 units at breakfast and 7 supper   On his initial consultation because of high postprandial readings he was started on Regular Insulin before each meal   A1c is improved at 7.2, previously 7.6 However has been as low as 6.6 in the past  Current blood sugar patterns and problems identified:  FASTING readings are being checked occasionally and they appear to be fairly good usually  He usually checks blood sugars mostly around lunchtime and before it after supper in the evening  Appears to have higher readings before lunch although they seem to be lower in the early afternoon  Because of his meter and not having the incorrect time programmed and not clear which readings are postprandial difficult to know which readings are before the meal  He is had a couple of readings in the 60s but he thinks this was from being late for lunch  Again he tries to cut  back on his regular insulin but only by 1 unit when he is planning to go shopping in the morning  His weight has gone up  Blood sugars in the evenings are generally fairly good  Other hypoglycemic drugs the patient is taking are:   none Side effects from medications have been: None  Compliance with the medical regimen: Good Hypoglycemia: with activity symptoms are weakness and feeling sweaty, treated with juice or other sweet drinks, usually can recognize blood sugars in the low 60s   Glucose monitoring:  done up to 3 times a day         Glucometer:  Contour      Blood Glucose readings by  review of his monitor  Mean values apply above for all meters except median for One Touch  PRE-MEAL Fasting 2 PM + 6 PM + Bedtime Overall  Glucose range: 114-162 64-267     Mean/median:  128 127  133    Self-care: The diet that the patient has been following is: tries to limit portions .     Typical meal intake: Breakfast is old-fashioned oatmeal at 10 am, lunch is usually a sandwich.  He has mostly popcorn for snacks   Dinner time is usually 6-7  Dietician visit, most recent: none  CDE consultation: 7/16               Exercise: Some walking, mostly when going shopping  Weight history:  Wt Readings from Last 3 Encounters:  08/19/17 239 lb (108.4 kg)  06/17/17 234 lb (106.1 kg)  06/07/17 230 lb (104.3 kg)    Glycemic control:   Lab Results  Component Value Date   HGBA1C 7.2 (H) 08/17/2017   HGBA1C 7.6 (H) 05/12/2017   HGBA1C 7.6 (H) 02/09/2017   Lab Results  Component Value Date   MICROALBUR 39.9 (H) 05/12/2016   LDLCALC 65 08/17/2017   CREATININE 3.27 (H) 08/17/2017      Allergies as of 08/19/2017   No Known Allergies     Medication List        Accurate as of 08/19/17  9:12 PM. Always use your most recent med list.          ALPHAGAN P 0.1 % Soln Generic drug:  brimonidine Place 2 drops 2 (two) times daily into both eyes.   aspirin EC 81 MG  tablet Take 81 mg by mouth at bedtime.   atorvastatin 20 MG tablet Commonly known as:  LIPITOR Take 20 mg by mouth daily.   carvedilol 25 MG tablet Commonly known as:  COREG Take 25 mg by mouth 2 (two) times daily.   cholecalciferol 1000 units tablet Commonly known as:  VITAMIN D Take 1,000 Units by mouth daily.   cloNIDine 0.1 MG tablet Commonly known as:  CATAPRES Take 0.1 mg by mouth 2 (two) times daily.   diphenhydramine-acetaminophen 25-500 MG Tabs tablet Commonly known as:  TYLENOL PM Take 2 tablets by mouth at bedtime.   ferrous sulfate 325 (65 FE) MG tablet Take 325 mg by mouth 2 (two) times daily.   finasteride 5 MG tablet Commonly known as:  PROSCAR Take 5 mg by mouth every evening.   Fish Oil 1000 MG Caps Take 1,000 mg by mouth 2 (two) times daily.   furosemide 80 MG tablet Commonly known as:  LASIX Take 40 mg by mouth daily.   hydrALAZINE 50 MG tablet Commonly known as:  APRESOLINE Take 50 mg by mouth 3 (three) times daily.   insulin NPH Human 100 UNIT/ML injection Commonly known as:  HUMULIN N,NOVOLIN N Inject 10-26 Units into the skin 2 (two) times daily. Pt uses 10 units in the morning and 26 units in the evening.   insulin regular 100 units/mL injection Commonly known as:  NOVOLIN R,HUMULIN R Inject 6-7 Units into the skin 2 (two) times daily. 6 units in the morning and 7 units at bedtime.   isosorbide mononitrate 120 MG 24 hr tablet Commonly known as:  IMDUR Take 120 mg by mouth daily.   multivitamin with minerals Tabs tablet Take 1 tablet by mouth daily.   tamsulosin 0.4 MG Caps capsule Commonly known as:  FLOMAX Take 0.4 mg by mouth daily after supper.   Travoprost (BAK Free) 0.004 % Soln ophthalmic solution Commonly known as:  TRAVATAN Place 1 drop into both eyes at bedtime.   triamcinolone cream 0.1 % Commonly known as:  KENALOG Apply 1 application topically 2 (two) times daily as needed (for itching/irritation).   vitamin C  1000 MG tablet Take 1,000 mg by mouth daily.       Allergies: No Known Allergies  Past Medical History:  Diagnosis Date  . Anemia   . Arthritis   . Cancer ([place]) 2015-present   CLL  .  Chronic kidney disease   . Coronary artery disease 04/2016   1 stent  . Diabetes mellitus    insulin dependent   . Enlarged prostate   . Headache   . Hypertension   . Pneumonia     Past Surgical History:  Procedure Laterality Date  . AV FISTULA PLACEMENT Left 04/28/2017   Procedure: Left arm ARTERIOVENOUS (AV) FISTULA CREATION;  Surgeon: Angelia Mould, MD;  Location: [facility];  Service: Vascular;  Laterality: Left;  . CARDIAC CATHETERIZATION N/A 05/01/2015   Procedure: Left Heart Cath and Coronary Angiography;  Surgeon: Adrian Prows, MD;  Location: [facility];  Service: Cardiovascular;  Laterality: N/A;  . CARDIAC CATHETERIZATION  05/01/2015   Procedure: Intravascular Pressure Wire/FFR Study;  Surgeon: Adrian Prows, MD;  Location: [facility];  Service: Cardiovascular;;  . CARDIAC CATHETERIZATION N/A 05/22/2015   Procedure: Left Heart Cath and Coronary Angiography;  Surgeon: Adrian Prows, MD;  Location: [facility];  Service: Cardiovascular;  Laterality: N/A;  . CARDIAC CATHETERIZATION N/A 05/22/2015   Procedure: Coronary Stent Intervention;  Surgeon: Adrian Prows, MD;  Location: [facility];  Service: Cardiovascular;  Laterality: N/A;  . COLONOSCOPY W/ BIOPSIES AND POLYPECTOMY    . SHOULDER SURGERY    . TONSILLECTOMY      Family History  Problem Relation Age of Onset  . Hypertension Mother   . Heart disease Sister   . Stroke Sister   . Diabetes Brother     Social History:  reports that he has quit smoking. he has never used smokeless tobacco. He reports that he does not drink alcohol or use drugs.    Review of Systems   THYROID nodule:    He has a dominant left-sided thyroid nodule and did have a biopsy done Results as follows: BENIGN FOLLICULAR CELLS,  COLLOID AND MACROPHAGES, CONSISTENT WITH  BENIGN GOITROUS NODULE  Last TSH was normal   Lipid history: Has been treated with Lipitor, followed by PCP and cardiologist  LDL still below 70  With increasing his Lovaza from 1 up to 4 capsules daily his triglycerides are much better  Lab Results  Component Value Date   CHOL 117 08/17/2017   CHOL 129 05/12/2017   CHOL 147 05/12/2016   Lab Results  Component Value Date   HDL 32.00 (L) 08/17/2017   HDL 27.30 (L) 05/12/2017   HDL 38.50 (L) 05/12/2016   Lab Results  Component Value Date   LDLCALC 65 08/17/2017   LDLCALC 83 05/12/2016   LDLCALC 79 06/05/2015   Lab Results  Component Value Date   TRIG 102.0 08/17/2017   TRIG 300.0 (H) 05/12/2017   TRIG 127.0 05/12/2016   Lab Results  Component Value Date   CHOLHDL 4 08/17/2017   CHOLHDL 5 05/12/2017   CHOLHDL 4 05/12/2016   Lab Results  Component Value Date   LDLDIRECT 60.0 05/12/2017              Eyes: Marland Kitchen  Most recent eye exam was in 10/2016 and he is going regularly  RENAL dysfunction: He is followed by nephrologist     Lab Results  Component Value Date   CREATININE 3.27 (H) 08/17/2017   CREATININE 3.85 (H) 06/07/2017   CREATININE 3.4 (HH) 03/10/2017     Physical Examination:  BP (!) 137/46 (BP Location: Left Arm, Patient Position: Sitting, Cuff Size: Normal)   Pulse 72   Temp 97.8 F (36.6 C) (Oral)   Ht 6\' 3"  (1.905  m)   Wt 239 lb (108.4 kg)   SpO2 97%   BMI 29.87 kg/m   He has about a 3 cm smooth and firm nodule on the left thyroid lobe      ASSESSMENT:  Diabetes type 2, uncontrolled    See history of present illness for detailed discussion of his current management, blood sugar patterns and problems identified  the A1c is somewhat better at 7.2 This is likely adequate for his age, duration of diabetes and concomitant medical conditions  Although his blood sugars do not look any higher than before he has gained weightand not clear why He is  not having excess of hypoglycemia also Periodically tends to have higher readings after breakfast especially with eating more carbohydrate in the morning Has only a few fasting readings but these appear to be recently good  CKD: Continue to follow-up with nephrologist and review records when available  LIPIDS: He has had fairly good lipids and better triglycerides with increasing Lovaza up to 4 capsules  HYPERTENSION: Appears well-controlled  PLAN:   Increase morning regularsulin by at least 1 unit and he can reduce it again when he is planning to go shopping or walking  No change in other doses as yet  Watch diet  Follow-up in 3 months again  HYPERLIPIDEMIA: His triglycerides are better with increasing Lovaza and he will continue  Patient Instructions  Am regular 6 unless shopping       Elayne Snare 08/19/2017, 9:12 PM   Note: This office note was prepared with Dragon voice recognition system technology. Any transcriptional errors that result from this process are unintentional.

## 2017-08-19 NOTE — Patient Instructions (Signed)
Am regular 6 unless shopping

## 2017-08-26 ENCOUNTER — Other Ambulatory Visit: Payer: Self-pay | Admitting: *Deleted

## 2017-08-26 ENCOUNTER — Encounter: Payer: Self-pay | Admitting: *Deleted

## 2017-08-26 ENCOUNTER — Ambulatory Visit (INDEPENDENT_AMBULATORY_CARE_PROVIDER_SITE_OTHER): Payer: Medicare Other | Admitting: Vascular Surgery

## 2017-08-26 ENCOUNTER — Ambulatory Visit (HOSPITAL_COMMUNITY)
Admission: RE | Admit: 2017-08-26 | Discharge: 2017-08-26 | Disposition: A | Discharge status: Home / Self Care | Payer: Medicare Other | Source: Ambulatory Visit | Attending: Vascular Surgery | Admitting: Vascular Surgery

## 2017-08-26 ENCOUNTER — Encounter: Payer: Self-pay | Admitting: Vascular Surgery

## 2017-08-26 VITALS — BP 172/62 | HR 52 | Temp 97.4°F | Resp 16 | Ht 75.0 in | Wt 243.0 lb

## 2017-08-26 DIAGNOSIS — N184 Chronic kidney disease, stage 4 (severe): Secondary | ICD-10-CM

## 2017-08-26 DIAGNOSIS — N179 Acute kidney failure, unspecified: Secondary | ICD-10-CM

## 2017-08-26 DIAGNOSIS — N189 Chronic kidney disease, unspecified: Secondary | ICD-10-CM | POA: Insufficient documentation

## 2017-08-26 NOTE — Progress Notes (Signed)
Patient name: Ray Merritt MRN: [id number] DOB: Dec 07, 1935 Sex: male  REASON FOR VISIT:   Follow-up of left radiocephalic AV fistula.  HPI:   Ray Merritt is a pleasant 82 y.o. male who I last saw on 06/17/2017.  This patient had a left radiocephalic fistula placed on 04/28/2017.  He duplex at that time showed that the diameters of the fistula ranged from 0.24-0.37.  The fistula had a good thrill but was not adequate in size.  It is only been 6 weeks.  He is not on dialysis.  I therefore recommend a follow-up study in 2 months.  Since I saw him last, he denies any pain or paresthesias in his left arm.  He is not on dialysis.  He states that his renal function has improved some.  Current Outpatient Medications  Medication Sig Dispense Refill  . ALPHAGAN P 0.1 % SOLN Place 2 drops 2 (two) times daily into both eyes.   0  . Ascorbic Acid (VITAMIN C) 1000 MG tablet Take 1,000 mg by mouth daily.    Marland Kitchen aspirin EC 81 MG tablet Take 81 mg by mouth at bedtime.     Marland Kitchen atorvastatin (LIPITOR) 20 MG tablet Take 20 mg by mouth daily.  0  . carvedilol (COREG) 25 MG tablet Take 25 mg by mouth 2 (two) times daily.   0  . cholecalciferol (VITAMIN D) 1000 units tablet Take 1,000 Units by mouth daily.     . cloNIDine (CATAPRES) 0.1 MG tablet Take 0.1 mg by mouth 2 (two) times daily.  0  . diphenhydramine-acetaminophen (TYLENOL PM) 25-500 MG TABS tablet Take 2 tablets by mouth at bedtime.    . ferrous sulfate 325 (65 FE) MG tablet Take 325 mg by mouth 2 (two) times daily.     . finasteride (PROSCAR) 5 MG tablet Take 5 mg by mouth every evening.     . furosemide (LASIX) 80 MG tablet Take 40 mg by mouth daily.   1  . hydrALAZINE (APRESOLINE) 50 MG tablet Take 50 mg by mouth 3 (three) times daily.  0  . insulin NPH Human (HUMULIN N,NOVOLIN N) 100 UNIT/ML injection Inject 10-26 Units into the skin 2 (two) times daily. Pt uses 10 units in the morning and 26 units in the evening.    . insulin regular (NOVOLIN  R,HUMULIN R) 100 units/mL injection Inject 6-7 Units into the skin 2 (two) times daily. 6 units in the morning and 7 units at bedtime.    . isosorbide mononitrate (IMDUR) 120 MG 24 hr tablet Take 120 mg by mouth daily.    . Multiple Vitamin (MULTIVITAMIN WITH MINERALS) TABS tablet Take 1 tablet by mouth daily.    . Omega-3 Fatty Acids (FISH OIL) 1000 MG CAPS Take 1,000 mg by mouth 2 (two) times daily.    . tamsulosin (FLOMAX) 0.4 MG CAPS capsule Take 0.4 mg by mouth daily after supper.     . Travoprost, BAK Free, (TRAVATAN) 0.004 % SOLN ophthalmic solution Place 1 drop into both eyes at bedtime.    . triamcinolone cream (KENALOG) 0.1 % Apply 1 application topically 2 (two) times daily as needed (for itching/irritation).   0   No current facility-administered medications for this visit.     REVIEW OF SYSTEMS:  [X]  denotes positive finding, [ ]  denotes negative finding Cardiac  Comments:  Chest pain or chest pressure:    Shortness of breath upon exertion: x   Short of breath when lying flat:  Irregular heart rhythm:    Constitutional    Fever or chills:     PHYSICAL EXAM:   Vitals:   08/26/17 1301  BP: (!) 172/62  Pulse: (!) 52  Resp: 16  Temp: (!) 97.4 F (36.3 C)  TempSrc: Oral  SpO2: 96%  Weight: 243 lb (110.2 kg)  Height: 6\' 3"  (1.905 m)    GENERAL: The patient is a well-nourished male, in no acute distress. The vital signs are documented above. CARDIOVASCULAR: There is a regular rate and rhythm. PULMONARY: There is good air exchange bilaterally without wheezing or rales. The fistula has a weak thrill.  It is not ready for dialysis.  He has a palpable left radial pulse.  DATA:   DUPLEX LEFT RADIOCEPHALIC AV FISTULA: I have independently interpreted the duplex of the left radiocephalic AV fistula.  Diameters of the fistula range from 0.3-0.4 cm.  This is increased slightly in size but is still not adequate.  There is also an area of increased velocity of the proximal  fistula likely related to her retained bowel.  MEDICAL ISSUES:   STATUS POST LEFT RADIOCEPHALIC AV FISTULA: This fistula is not maturing adequately.  This was placed in October.  He has an area of stenosis in the proximal fistula which is likely a retained bowel.  Given that the fistula does not appear to be maturing adequately, I have recommended we proceed with a fistulogram with limited contrast to see if we can potentially address the area of stenosis to help with maturation of the fistula.  This has been scheduled for March 1.  I have discussed the procedure and potential complications with the patient and all his questions were answered.  Deitra Mayo [facility] [phone]

## 2017-09-09 DIAGNOSIS — N184 Chronic kidney disease, stage 4 (severe): Secondary | ICD-10-CM | POA: Diagnosis not present

## 2017-09-09 DIAGNOSIS — N2581 Secondary hyperparathyroidism of renal origin: Secondary | ICD-10-CM | POA: Diagnosis not present

## 2017-09-09 DIAGNOSIS — Z6833 Body mass index (BMI) 33.0-33.9, adult: Secondary | ICD-10-CM | POA: Diagnosis not present

## 2017-09-09 DIAGNOSIS — C9190 Lymphoid leukemia, unspecified not having achieved remission: Secondary | ICD-10-CM | POA: Diagnosis not present

## 2017-09-09 DIAGNOSIS — E8770 Fluid overload, unspecified: Secondary | ICD-10-CM | POA: Diagnosis not present

## 2017-09-09 DIAGNOSIS — I129 Hypertensive chronic kidney disease with stage 1 through stage 4 chronic kidney disease, or unspecified chronic kidney disease: Secondary | ICD-10-CM | POA: Diagnosis not present

## 2017-09-09 DIAGNOSIS — I770 Arteriovenous fistula, acquired: Secondary | ICD-10-CM | POA: Diagnosis not present

## 2017-09-10 ENCOUNTER — Inpatient Hospital Stay: Payer: Medicare Other

## 2017-09-10 ENCOUNTER — Telehealth: Payer: Self-pay | Admitting: Internal Medicine

## 2017-09-10 ENCOUNTER — Encounter: Payer: Self-pay | Admitting: Internal Medicine

## 2017-09-10 ENCOUNTER — Inpatient Hospital Stay: Payer: Medicare Other | Attending: Internal Medicine | Admitting: Internal Medicine

## 2017-09-10 VITALS — BP 162/60 | HR 65 | Temp 98.0°F | Resp 20 | Ht 75.0 in | Wt 239.5 lb

## 2017-09-10 DIAGNOSIS — C9110 Chronic lymphocytic leukemia of B-cell type not having achieved remission: Secondary | ICD-10-CM

## 2017-09-10 DIAGNOSIS — N189 Chronic kidney disease, unspecified: Secondary | ICD-10-CM | POA: Diagnosis not present

## 2017-09-10 DIAGNOSIS — D631 Anemia in chronic kidney disease: Secondary | ICD-10-CM | POA: Diagnosis not present

## 2017-09-10 LAB — CBC WITH DIFFERENTIAL/PLATELET
Basophils Absolute: 0.1 10*3/uL (ref 0.0–0.1)
Basophils Relative: 1 %
EOS PCT: 3 %
Eosinophils Absolute: 0.3 10*3/uL (ref 0.0–0.5)
HCT: 29.3 % — ABNORMAL LOW (ref 38.4–49.9)
HEMOGLOBIN: 9.5 g/dL — AB (ref 13.0–17.1)
LYMPHS ABS: 4.3 10*3/uL — AB (ref 0.9–3.3)
LYMPHS PCT: 43 %
MCH: 29.3 pg (ref 27.2–33.4)
MCHC: 32.4 g/dL (ref 32.0–36.0)
MCV: 90.4 fL (ref 79.3–98.0)
MONOS PCT: 6 %
Monocytes Absolute: 0.6 10*3/uL (ref 0.1–0.9)
NEUTROS PCT: 47 %
Neutro Abs: 4.7 10*3/uL (ref 1.5–6.5)
Platelets: 209 10*3/uL (ref 140–400)
RBC: 3.24 MIL/uL — AB (ref 4.20–5.82)
RDW: 14.3 % (ref 11.0–14.6)
WBC: 9.9 10*3/uL (ref 4.0–10.3)

## 2017-09-10 LAB — COMPREHENSIVE METABOLIC PANEL
ALK PHOS: 65 U/L (ref 40–150)
ALT: 15 U/L (ref 0–55)
ANION GAP: 12 — AB (ref 3–11)
AST: 11 U/L (ref 5–34)
Albumin: 3.3 g/dL — ABNORMAL LOW (ref 3.5–5.0)
BUN: 75 mg/dL — ABNORMAL HIGH (ref 7–26)
CALCIUM: 9.2 mg/dL (ref 8.4–10.4)
CO2: 17 mmol/L — AB (ref 22–29)
CREATININE: 3.17 mg/dL — AB (ref 0.70–1.30)
Chloride: 110 mmol/L — ABNORMAL HIGH (ref 98–109)
GFR, EST AFRICAN AMERICAN: 20 mL/min — AB (ref >60)
GFR, EST NON AFRICAN AMERICAN: 17 mL/min — AB (ref >60)
Glucose, Bld: 196 mg/dL — ABNORMAL HIGH (ref 70–140)
Potassium: 4.3 mmol/L (ref 3.5–5.1)
SODIUM: 139 mmol/L (ref 136–145)
TOTAL PROTEIN: 6.4 g/dL (ref 6.4–8.3)
Total Bilirubin: 0.3 mg/dL (ref 0.2–1.2)

## 2017-09-10 LAB — LACTATE DEHYDROGENASE: LDH: 178 U/L (ref 125–245)

## 2017-09-10 NOTE — Progress Notes (Signed)
[facility] [phone]   [phone]  OFFICE PROGRESS NOTE  [name], MD [address] [place] [address]  DIAGNOSIS: Chronic lymphocytic leukemia diagnosed in July 2015  PRIOR THERAPY: None  CURRENT THERAPY: Observation.  INTERVAL HISTORY: Ray Merritt 82 y.o. male returns to the clinic today for 3 months follow-up visit.  The patient is feeling fine today with no specific complaints.  He denied having any fatigue or weakness.  He denied having any chest pain, shortness of breath, cough or hemoptysis.  He has no bleeding issues.  He denied having any recent infection.  He has no nausea, vomiting, diarrhea or constipation.  He is followed by Dr. Florene Glen for his chronic renal insufficiency.  The patient is here today for evaluation with repeat blood work.   MEDICAL HISTORY: Past Medical History:  Diagnosis Date  . Anemia   . Arthritis   . Cancer ([place]) 2015-present   CLL  . Chronic kidney disease   . Coronary artery disease 04/2016   1 stent  . Diabetes mellitus    insulin dependent   . Enlarged prostate   . Headache   . Hypertension   . Pneumonia     ALLERGIES:  has No Known Allergies.  MEDICATIONS:  Current Outpatient Medications  Medication Sig Dispense Refill  . ALPHAGAN P 0.1 % SOLN Place 2 drops 2 (two) times daily into both eyes.   0  . Ascorbic Acid (VITAMIN C) 1000 MG tablet Take 1,000 mg by mouth daily.    Marland Kitchen aspirin EC 81 MG tablet Take 81 mg by mouth at bedtime.     Marland Kitchen atorvastatin (LIPITOR) 20 MG tablet Take 20 mg by mouth daily.  0  . carvedilol (COREG) 25 MG tablet Take 25 mg by mouth 2 (two) times daily.   0  . cholecalciferol (VITAMIN D) 1000 units tablet Take 1,000 Units by mouth daily.     . cloNIDine (CATAPRES) 0.1 MG tablet Take 0.1 mg by mouth 2 (two) times daily.  0  . diphenhydramine-acetaminophen (TYLENOL PM) 25-500 MG TABS tablet Take 2 tablets by mouth at bedtime.    .  ferrous sulfate 325 (65 FE) MG tablet Take 325 mg by mouth 2 (two) times daily.     . finasteride (PROSCAR) 5 MG tablet Take 5 mg by mouth every evening.     . furosemide (LASIX) 80 MG tablet Take 40 mg by mouth daily.   1  . hydrALAZINE (APRESOLINE) 50 MG tablet Take 50 mg by mouth 3 (three) times daily.  0  . insulin NPH Human (HUMULIN N,NOVOLIN N) 100 UNIT/ML injection Inject 10-26 Units into the skin See admin instructions. Pt uses 10 units in the morning and 26 units in the evening.     . insulin regular (NOVOLIN R,HUMULIN R) 100 units/mL injection Inject 6-7 Units into the skin See admin instructions. 6 units in the morning and 7 units at bedtime.    . isosorbide mononitrate (IMDUR) 120 MG 24 hr tablet Take 120 mg by mouth daily.    . Multiple Vitamin (MULTIVITAMIN WITH MINERALS) TABS tablet Take 1 tablet by mouth daily.    . Omega-3 Fatty Acids (FISH OIL) 1000 MG CAPS Take 1,000 mg by mouth 2 (two) times daily.    . tamsulosin (FLOMAX) 0.4 MG CAPS capsule Take 0.4 mg by mouth daily after supper.     . Travoprost, BAK Free, (TRAVATAN) 0.004 % SOLN ophthalmic  solution Place 1 drop into both eyes at bedtime.    . triamcinolone cream (KENALOG) 0.1 % Apply 1 application topically 2 (two) times daily as needed (for itching/irritation).   0   No current facility-administered medications for this visit.     SURGICAL HISTORY:  Past Surgical History:  Procedure Laterality Date  . AV FISTULA PLACEMENT Left 04/28/2017   Procedure: Left arm ARTERIOVENOUS (AV) FISTULA CREATION;  Surgeon: Angelia Mould, MD;  Location: [facility];  Service: Vascular;  Laterality: Left;  . CARDIAC CATHETERIZATION N/A 05/01/2015   Procedure: Left Heart Cath and Coronary Angiography;  Surgeon: Adrian Prows, MD;  Location: [facility];  Service: Cardiovascular;  Laterality: N/A;  . CARDIAC CATHETERIZATION  05/01/2015   Procedure: Intravascular Pressure Wire/FFR Study;  Surgeon: Adrian Prows, MD;  Location: [facility];  Service: Cardiovascular;;  . CARDIAC CATHETERIZATION N/A 05/22/2015   Procedure: Left Heart Cath and Coronary Angiography;  Surgeon: Adrian Prows, MD;  Location: [facility];  Service: Cardiovascular;  Laterality: N/A;  . CARDIAC CATHETERIZATION N/A 05/22/2015   Procedure: Coronary Stent Intervention;  Surgeon: Adrian Prows, MD;  Location: [facility];  Service: Cardiovascular;  Laterality: N/A;  . COLONOSCOPY W/ BIOPSIES AND POLYPECTOMY    . SHOULDER SURGERY    . TONSILLECTOMY      REVIEW OF SYSTEMS:  A comprehensive review of systems was negative.   PHYSICAL EXAMINATION: General appearance: alert, cooperative and no distress Head: Normocephalic, without obvious abnormality, atraumatic Neck: no adenopathy, no JVD, supple, symmetrical, trachea midline and thyroid not enlarged, symmetric, no tenderness/mass/nodules Lymph nodes: Cervical, supraclavicular, and axillary nodes normal. Resp: clear to auscultation bilaterally Back: symmetric, no curvature. ROM normal. No CVA tenderness. Cardio: regular rate and rhythm, S1, S2 normal, no murmur, click, rub or gallop GI: soft, non-tender; bowel sounds normal; no masses,  no organomegaly Extremities: extremities normal, atraumatic, no cyanosis or edema  ECOG PERFORMANCE STATUS: 1 - Symptomatic but completely ambulatory  Blood pressure (!) 162/60, pulse 65, temperature 98 F (36.7 C), temperature source Oral, resp. rate 20, height 6\' 3"  (1.905 m), weight 239 lb 8 oz (108.6 kg), SpO2 100 %.  LABORATORY DATA: Lab Results  Component Value Date   WBC 9.9 09/10/2017   HGB 9.5 (L) 09/10/2017   HCT 29.3 (L) 09/10/2017   MCV 90.4 09/10/2017   PLT 209 09/10/2017      Chemistry      Component Value Date/Time   NA 139 09/10/2017 0948   NA 137 03/10/2017 0848   K 4.3 09/10/2017 0948   K 4.3 03/10/2017 0848   CL 110 (H) 09/10/2017 0948   CO2 17 (L) 09/10/2017 0948   CO2 23 03/10/2017 0848   BUN 75 (H) 09/10/2017 0948   BUN 79.1  (H) 03/10/2017 0848   CREATININE 3.17 (HH) 09/10/2017 0948   CREATININE 3.4 (HH) 03/10/2017 0848      Component Value Date/Time   CALCIUM 9.2 09/10/2017 0948   CALCIUM 9.6 03/10/2017 0848   ALKPHOS 65 09/10/2017 0948   ALKPHOS 64 03/10/2017 0848   AST 11 09/10/2017 0948   AST 13 03/10/2017 0848   ALT 15 09/10/2017 0948   ALT 16 03/10/2017 0848   BILITOT 0.3 09/10/2017 0948   BILITOT 0.38 03/10/2017 0848       RADIOGRAPHIC STUDIES: No results found.  ASSESSMENT AND PLAN:  This is a very pleasant 82 years old African-American male with history of chronic lymphocytic leukemia. The patient is  feeling fine today with no specific complaints.  CBC performed recently showed total white blood count of 9.9 which is within the normal range. He continues to have mild anemia secondary to anemia of chronic disease from his renal insufficiency. I recommended for the patient to continue in observation with repeat CBC and LDH in 6 months. For the chronic renal insufficiency, the patient is followed by Dr. Florene Glen. The patient was advised to call immediately if he has any concerning symptoms in the interval. The patient voices understanding of current disease status and treatment options and is in agreement with the current care plan. All questions were answered. The patient knows to call the clinic with any problems, questions or concerns. We can certainly see the patient much sooner if necessary. I spent 10 minutes counseling the patient face to face. The total time spent in the appointment was 15 minutes. Disclaimer: This note was dictated with voice recognition software. Similar sounding words can inadvertently be transcribed and may not be corrected upon review.

## 2017-09-10 NOTE — Telephone Encounter (Signed)
Appointments scheduled AVS/Calendar printed per 2/21 los °

## 2017-09-17 DIAGNOSIS — I5022 Chronic systolic (congestive) heart failure: Secondary | ICD-10-CM | POA: Diagnosis not present

## 2017-09-17 DIAGNOSIS — I10 Essential (primary) hypertension: Secondary | ICD-10-CM | POA: Diagnosis not present

## 2017-09-17 DIAGNOSIS — N400 Enlarged prostate without lower urinary tract symptoms: Secondary | ICD-10-CM | POA: Diagnosis not present

## 2017-09-17 DIAGNOSIS — I25118 Atherosclerotic heart disease of native coronary artery with other forms of angina pectoris: Secondary | ICD-10-CM | POA: Diagnosis not present

## 2017-09-17 DIAGNOSIS — E785 Hyperlipidemia, unspecified: Secondary | ICD-10-CM | POA: Diagnosis not present

## 2017-09-17 DIAGNOSIS — N183 Chronic kidney disease, stage 3 (moderate): Secondary | ICD-10-CM | POA: Diagnosis not present

## 2017-09-17 DIAGNOSIS — D638 Anemia in other chronic diseases classified elsewhere: Secondary | ICD-10-CM | POA: Diagnosis not present

## 2017-09-17 DIAGNOSIS — E1122 Type 2 diabetes mellitus with diabetic chronic kidney disease: Secondary | ICD-10-CM | POA: Diagnosis not present

## 2017-09-18 ENCOUNTER — Ambulatory Visit (HOSPITAL_COMMUNITY): Admission: RE | Admit: 2017-09-18 | Payer: Medicare Other | Source: Ambulatory Visit | Admitting: Vascular Surgery

## 2017-09-18 ENCOUNTER — Encounter (HOSPITAL_COMMUNITY): Admission: RE | Payer: Self-pay | Source: Ambulatory Visit

## 2017-09-18 SURGERY — A/V FISTULAGRAM
Anesthesia: LOCAL

## 2017-10-08 DIAGNOSIS — I25119 Atherosclerotic heart disease of native coronary artery with unspecified angina pectoris: Secondary | ICD-10-CM | POA: Diagnosis not present

## 2017-10-08 DIAGNOSIS — N184 Chronic kidney disease, stage 4 (severe): Secondary | ICD-10-CM | POA: Diagnosis not present

## 2017-10-08 DIAGNOSIS — E1122 Type 2 diabetes mellitus with diabetic chronic kidney disease: Secondary | ICD-10-CM | POA: Diagnosis not present

## 2017-10-08 DIAGNOSIS — E1165 Type 2 diabetes mellitus with hyperglycemia: Secondary | ICD-10-CM | POA: Diagnosis not present

## 2017-10-16 DIAGNOSIS — E785 Hyperlipidemia, unspecified: Secondary | ICD-10-CM | POA: Diagnosis not present

## 2017-10-16 DIAGNOSIS — H409 Unspecified glaucoma: Secondary | ICD-10-CM | POA: Diagnosis not present

## 2017-10-16 DIAGNOSIS — I10 Essential (primary) hypertension: Secondary | ICD-10-CM | POA: Diagnosis not present

## 2017-10-16 DIAGNOSIS — I5022 Chronic systolic (congestive) heart failure: Secondary | ICD-10-CM | POA: Diagnosis not present

## 2017-10-16 DIAGNOSIS — E1122 Type 2 diabetes mellitus with diabetic chronic kidney disease: Secondary | ICD-10-CM | POA: Diagnosis not present

## 2017-10-16 DIAGNOSIS — I5032 Chronic diastolic (congestive) heart failure: Secondary | ICD-10-CM | POA: Diagnosis not present

## 2017-10-16 DIAGNOSIS — N184 Chronic kidney disease, stage 4 (severe): Secondary | ICD-10-CM | POA: Diagnosis not present

## 2017-10-16 DIAGNOSIS — D638 Anemia in other chronic diseases classified elsewhere: Secondary | ICD-10-CM | POA: Diagnosis not present

## 2017-10-16 DIAGNOSIS — I25118 Atherosclerotic heart disease of native coronary artery with other forms of angina pectoris: Secondary | ICD-10-CM | POA: Diagnosis not present

## 2017-10-16 DIAGNOSIS — N400 Enlarged prostate without lower urinary tract symptoms: Secondary | ICD-10-CM | POA: Diagnosis not present

## 2017-10-16 DIAGNOSIS — E119 Type 2 diabetes mellitus without complications: Secondary | ICD-10-CM | POA: Diagnosis not present

## 2017-10-16 DIAGNOSIS — Z794 Long term (current) use of insulin: Secondary | ICD-10-CM | POA: Diagnosis not present

## 2017-10-27 ENCOUNTER — Telehealth: Payer: Self-pay | Admitting: Endocrinology

## 2017-10-27 NOTE — Telephone Encounter (Signed)
[facility] is calling on status of CMN form that they faxed over to the office This was for the CONTOUR NEXT test strips.   [phone] (Fax)

## 2017-10-29 NOTE — Telephone Encounter (Signed)
Called [facility] and requested new CMN be faxed to [place] in order to fill out and resend.

## 2017-11-03 DIAGNOSIS — I5032 Chronic diastolic (congestive) heart failure: Secondary | ICD-10-CM | POA: Diagnosis not present

## 2017-11-04 DIAGNOSIS — E109 Type 1 diabetes mellitus without complications: Secondary | ICD-10-CM | POA: Diagnosis not present

## 2017-11-05 LAB — HM DIABETES EYE EXAM

## 2017-11-05 NOTE — Telephone Encounter (Signed)
[facility] called and stated that pt no longer needs CMN.

## 2017-11-16 DIAGNOSIS — I10 Essential (primary) hypertension: Secondary | ICD-10-CM | POA: Diagnosis not present

## 2017-11-16 DIAGNOSIS — Z794 Long term (current) use of insulin: Secondary | ICD-10-CM | POA: Diagnosis not present

## 2017-11-16 DIAGNOSIS — N400 Enlarged prostate without lower urinary tract symptoms: Secondary | ICD-10-CM | POA: Diagnosis not present

## 2017-11-16 DIAGNOSIS — I5022 Chronic systolic (congestive) heart failure: Secondary | ICD-10-CM | POA: Diagnosis not present

## 2017-11-16 DIAGNOSIS — I25118 Atherosclerotic heart disease of native coronary artery with other forms of angina pectoris: Secondary | ICD-10-CM | POA: Diagnosis not present

## 2017-11-16 DIAGNOSIS — N183 Chronic kidney disease, stage 3 (moderate): Secondary | ICD-10-CM | POA: Diagnosis not present

## 2017-11-16 DIAGNOSIS — E119 Type 2 diabetes mellitus without complications: Secondary | ICD-10-CM | POA: Diagnosis not present

## 2017-11-16 DIAGNOSIS — H409 Unspecified glaucoma: Secondary | ICD-10-CM | POA: Diagnosis not present

## 2017-11-16 DIAGNOSIS — D638 Anemia in other chronic diseases classified elsewhere: Secondary | ICD-10-CM | POA: Diagnosis not present

## 2017-11-16 DIAGNOSIS — E785 Hyperlipidemia, unspecified: Secondary | ICD-10-CM | POA: Diagnosis not present

## 2017-11-16 DIAGNOSIS — I5032 Chronic diastolic (congestive) heart failure: Secondary | ICD-10-CM | POA: Diagnosis not present

## 2017-12-03 DIAGNOSIS — N184 Chronic kidney disease, stage 4 (severe): Secondary | ICD-10-CM | POA: Diagnosis not present

## 2017-12-03 DIAGNOSIS — E1122 Type 2 diabetes mellitus with diabetic chronic kidney disease: Secondary | ICD-10-CM | POA: Diagnosis not present

## 2017-12-03 DIAGNOSIS — E785 Hyperlipidemia, unspecified: Secondary | ICD-10-CM | POA: Diagnosis not present

## 2017-12-03 DIAGNOSIS — N400 Enlarged prostate without lower urinary tract symptoms: Secondary | ICD-10-CM | POA: Diagnosis not present

## 2017-12-03 DIAGNOSIS — I10 Essential (primary) hypertension: Secondary | ICD-10-CM | POA: Diagnosis not present

## 2017-12-03 DIAGNOSIS — I25118 Atherosclerotic heart disease of native coronary artery with other forms of angina pectoris: Secondary | ICD-10-CM | POA: Diagnosis not present

## 2017-12-03 DIAGNOSIS — Z794 Long term (current) use of insulin: Secondary | ICD-10-CM | POA: Diagnosis not present

## 2017-12-03 DIAGNOSIS — I5022 Chronic systolic (congestive) heart failure: Secondary | ICD-10-CM | POA: Diagnosis not present

## 2017-12-03 DIAGNOSIS — H409 Unspecified glaucoma: Secondary | ICD-10-CM | POA: Diagnosis not present

## 2017-12-03 DIAGNOSIS — I5032 Chronic diastolic (congestive) heart failure: Secondary | ICD-10-CM | POA: Diagnosis not present

## 2017-12-03 DIAGNOSIS — D638 Anemia in other chronic diseases classified elsewhere: Secondary | ICD-10-CM | POA: Diagnosis not present

## 2017-12-09 DIAGNOSIS — I129 Hypertensive chronic kidney disease with stage 1 through stage 4 chronic kidney disease, or unspecified chronic kidney disease: Secondary | ICD-10-CM | POA: Diagnosis not present

## 2017-12-09 DIAGNOSIS — N184 Chronic kidney disease, stage 4 (severe): Secondary | ICD-10-CM | POA: Diagnosis not present

## 2017-12-09 DIAGNOSIS — D649 Anemia, unspecified: Secondary | ICD-10-CM | POA: Diagnosis not present

## 2017-12-09 DIAGNOSIS — N183 Chronic kidney disease, stage 3 (moderate): Secondary | ICD-10-CM | POA: Diagnosis not present

## 2017-12-09 DIAGNOSIS — E8770 Fluid overload, unspecified: Secondary | ICD-10-CM | POA: Diagnosis not present

## 2017-12-09 DIAGNOSIS — N2581 Secondary hyperparathyroidism of renal origin: Secondary | ICD-10-CM | POA: Diagnosis not present

## 2017-12-09 DIAGNOSIS — C9190 Lymphoid leukemia, unspecified not having achieved remission: Secondary | ICD-10-CM | POA: Diagnosis not present

## 2017-12-09 DIAGNOSIS — D631 Anemia in chronic kidney disease: Secondary | ICD-10-CM | POA: Diagnosis not present

## 2017-12-11 ENCOUNTER — Other Ambulatory Visit (INDEPENDENT_AMBULATORY_CARE_PROVIDER_SITE_OTHER): Payer: Medicare Other

## 2017-12-11 DIAGNOSIS — E1165 Type 2 diabetes mellitus with hyperglycemia: Secondary | ICD-10-CM

## 2017-12-11 DIAGNOSIS — Z794 Long term (current) use of insulin: Secondary | ICD-10-CM | POA: Diagnosis not present

## 2017-12-11 LAB — GLUCOSE, RANDOM: Glucose, Bld: 131 mg/dL — ABNORMAL HIGH (ref 70–99)

## 2017-12-11 LAB — MICROALBUMIN / CREATININE URINE RATIO
CREATININE, U: 81.3 mg/dL
MICROALB/CREAT RATIO: 59.1 mg/g — AB (ref 0.0–30.0)
Microalb, Ur: 48.1 mg/dL — ABNORMAL HIGH (ref 0.0–1.9)

## 2017-12-11 LAB — HEMOGLOBIN A1C: Hgb A1c MFr Bld: 6.6 % — ABNORMAL HIGH (ref 4.6–6.5)

## 2017-12-14 NOTE — Progress Notes (Signed)
Patient ID: Ray Merritt, male   DOB: [birth year], 82 y.o.   MRN: [id number]           Reason for Appointment: Follow-up for Type 2 Diabetes   History of Present Illness:          Date of diagnosis of type 2 diabetes mellitus : 1982        Background history:   He has been on insulin since the time of diagnosis, initially his blood sugars were significantly high ; he was symptomatic with dry mouth and blurred vision. He thinks he was probably taking NPH and Regular Insulin for quite some time and before his consultation was only on NPH 3 years ago he was put on Bydureon weekly in addition to NPH.  He thinks that it initially curb his appetite but he does not think he lost any significant amount of weight.  Also he does not think his blood sugar control was much better For about a year he had been switched from Bydureon to Tanzeum weekly  His  Tanzeum was stopped because of lack of benefit A1c has been as high as 11.8 in 2/16  Recent history:   INSULIN regimen is : Novolin NPH 10 units acb and 26 units bedtime, Regular Insulin 4-5 units at breakfast and 7 supper   On his initial consultation because of high postprandial readings he was started on Regular Insulin before each meal   A1c is improved at 6.6 and continues to be getting lower He does have renal disease and anemia  Current blood sugar patterns and problems identified:  FASTING readings are being checked on the occasionally and has one reading of 87 and his lab glucose was 131 fasting  He still takes the largest dose of NPH at bedtime   He usually checks blood sugars mostly right before or right after lunchtime and before and after supper in the evening however his meter is not set up to monitor his postprandial readings  He is still trying to cut back at least 1 unit on his morning regular insulin when he is planning to be more active after breakfast  More recently with doing things like yard work he has lower readings  before suppertime including a reading of 59 recently  He has a few readings after supper but not clear which are after eating, these are also excellent with only 1 reading 199  Otherwise has no significant hypoglycemia on his recent readings  He still has difficulty losing weight :  Other hypoglycemic drugs the patient is taking are:   none Side effects from medications have been: None  Compliance with the medical regimen: Good Hypoglycemia: with activity symptoms are weakness and feeling sweaty, treated with juice or other sweet drinks, usually can recognize blood sugars in the low 60s   Glucose monitoring:  done up to 3 times a day         Glucometer:  Contour       Blood Glucose readings by  review of his monitor   Mean values apply above for all meters except median for One Touch  PRE-MEAL Fasting  1-2 PM  6-7 PM Bedtime Overall  Glucose range:  87  64-211  59-199    Mean/median:   116  97  106    Self-care: The diet that the patient has been following is: tries to limit portions .     Typical meal intake: Breakfast is old-fashioned oatmeal at 10 am, lunch is  usually a sandwich.  He has mostly popcorn for snacks   Dinner time is usually 6-7              Dietician visit, most recent: none  CDE consultation: 7/16               Exercise: Some walking, mostly when going shopping and some yard work  Weight history:  Wt Readings from Last 3 Encounters:  12/15/17 241 lb 6.4 oz (109.5 kg)  09/10/17 239 lb 8 oz (108.6 kg)  08/26/17 243 lb (110.2 kg)    Glycemic control:   Lab Results  Component Value Date   HGBA1C 6.6 (H) 12/11/2017   HGBA1C 7.2 (H) 08/17/2017   HGBA1C 7.6 (H) 05/12/2017   Lab Results  Component Value Date   MICROALBUR 48.1 (H) 12/11/2017   LDLCALC 65 08/17/2017   CREATININE 3.17 ([place]) 09/10/2017      Allergies as of 12/15/2017   No Known Allergies     Medication List        Accurate as of 12/15/17 11:20 AM. Always use your most recent med  list.          ALPHAGAN P 0.1 % Soln Generic drug:  brimonidine Place 2 drops 2 (two) times daily into both eyes.   aspirin EC 81 MG tablet Take 81 mg by mouth at bedtime.   atorvastatin 20 MG tablet Commonly known as:  LIPITOR Take 20 mg by mouth daily.   carvedilol 25 MG tablet Commonly known as:  COREG Take 25 mg by mouth 2 (two) times daily.   cholecalciferol 1000 units tablet Commonly known as:  VITAMIN D Take 1,000 Units by mouth daily.   cloNIDine 0.1 MG tablet Commonly known as:  CATAPRES Take 0.1 mg by mouth 2 (two) times daily.   diphenhydramine-acetaminophen 25-500 MG Tabs tablet Commonly known as:  TYLENOL PM Take 2 tablets by mouth at bedtime.   ferrous sulfate 325 (65 FE) MG tablet Take 325 mg by mouth 2 (two) times daily.   finasteride 5 MG tablet Commonly known as:  PROSCAR Take 5 mg by mouth every evening.   Fish Oil 1000 MG Caps Take 1,000 mg by mouth 2 (two) times daily.   furosemide 80 MG tablet Commonly known as:  LASIX Take 40 mg by mouth daily.   hydrALAZINE 50 MG tablet Commonly known as:  APRESOLINE Take 50 mg by mouth 3 (three) times daily.   insulin NPH Human 100 UNIT/ML injection Commonly known as:  HUMULIN N,NOVOLIN N Inject 10-26 Units into the skin See admin instructions. Pt uses 10 units in the morning and 26 units in the evening.   insulin regular 100 units/mL injection Commonly known as:  NOVOLIN R,HUMULIN R Inject 6-7 Units into the skin See admin instructions. 6 units in the morning and 7 units at bedtime.   isosorbide mononitrate 120 MG 24 hr tablet Commonly known as:  IMDUR Take 120 mg by mouth daily.   multivitamin with minerals Tabs tablet Take 1 tablet by mouth daily.   tamsulosin 0.4 MG Caps capsule Commonly known as:  FLOMAX Take 0.4 mg by mouth daily after supper.   Travoprost (BAK Free) 0.004 % Soln ophthalmic solution Commonly known as:  TRAVATAN Place 1 drop into both eyes at bedtime.     triamcinolone cream 0.1 % Commonly known as:  KENALOG Apply 1 application topically 2 (two) times daily as needed (for itching/irritation).   vitamin C 1000 MG tablet Take 1,000 mg  by mouth daily.       Allergies: No Known Allergies  Past Medical History:  Diagnosis Date  . Anemia   . Arthritis   . Cancer ([place]) 2015-present   CLL  . Chronic kidney disease   . Coronary artery disease 04/2016   1 stent  . Diabetes mellitus    insulin dependent   . Enlarged prostate   . Headache   . Hypertension   . Pneumonia     Past Surgical History:  Procedure Laterality Date  . AV FISTULA PLACEMENT Left 04/28/2017   Procedure: Left arm ARTERIOVENOUS (AV) FISTULA CREATION;  Surgeon: Angelia Mould, MD;  Location: [facility];  Service: Vascular;  Laterality: Left;  . CARDIAC CATHETERIZATION N/A 05/01/2015   Procedure: Left Heart Cath and Coronary Angiography;  Surgeon: Adrian Prows, MD;  Location: [facility];  Service: Cardiovascular;  Laterality: N/A;  . CARDIAC CATHETERIZATION  05/01/2015   Procedure: Intravascular Pressure Wire/FFR Study;  Surgeon: Adrian Prows, MD;  Location: [facility];  Service: Cardiovascular;;  . CARDIAC CATHETERIZATION N/A 05/22/2015   Procedure: Left Heart Cath and Coronary Angiography;  Surgeon: Adrian Prows, MD;  Location: [facility];  Service: Cardiovascular;  Laterality: N/A;  . CARDIAC CATHETERIZATION N/A 05/22/2015   Procedure: Coronary Stent Intervention;  Surgeon: Adrian Prows, MD;  Location: [facility];  Service: Cardiovascular;  Laterality: N/A;  . COLONOSCOPY W/ BIOPSIES AND POLYPECTOMY    . SHOULDER SURGERY    . TONSILLECTOMY      Family History  Problem Relation Age of Onset  . Hypertension Mother   . Heart disease Sister   . Stroke Sister   . Diabetes Brother     Social History:  reports that he has quit smoking. He has never used smokeless tobacco. He reports that he does not drink alcohol or use drugs.    Review  of Systems   THYROID nodule:    He has a dominant left-sided thyroid nodule and did have a biopsy done Results as follows: BENIGN FOLLICULAR CELLS, COLLOID AND MACROPHAGES, CONSISTENT WITH  BENIGN GOITROUS NODULE  Last TSH was normal and his last exam showed a 3 cm nodule clinically in 1/19   Lipid history: Has been treated with Lipitor, followed by PCP and cardiologist  LDL still below 70  With increasing his Lovaza from 1 up to 4 capsules daily his triglycerides are much better  Lab Results  Component Value Date   CHOL 117 08/17/2017   CHOL 129 05/12/2017   CHOL 147 05/12/2016   Lab Results  Component Value Date   HDL 32.00 (L) 08/17/2017   HDL 27.30 (L) 05/12/2017   HDL 38.50 (L) 05/12/2016   Lab Results  Component Value Date   LDLCALC 65 08/17/2017   LDLCALC 83 05/12/2016   LDLCALC 79 06/05/2015   Lab Results  Component Value Date   TRIG 102.0 08/17/2017   TRIG 300.0 (H) 05/12/2017   TRIG 127.0 05/12/2016   Lab Results  Component Value Date   CHOLHDL 4 08/17/2017   CHOLHDL 5 05/12/2017   CHOLHDL 4 05/12/2016   Lab Results  Component Value Date   LDLDIRECT 60.0 05/12/2017              Eyes: Marland Kitchen  Most recent eye exam was in 10/2016 and he is going regularly  RENAL dysfunction: He is followed by nephrologist, recently seen and also being treated for iron deficiency anemia     Lab Results  Component Value Date   CREATININE 3.17 (HH) 09/10/2017   CREATININE 3.27 (H) 08/17/2017   CREATININE 3.85 (H) 06/07/2017     Physical Examination:  BP (!) 150/60 (BP Location: Right Arm, Patient Position: Sitting, Cuff Size: Normal)   Pulse 66   Ht 6\' 3"  (1.905 m)   Wt 241 lb 6.4 oz (109.5 kg)   SpO2 96%   BMI 30.17 kg/m         ASSESSMENT:  Diabetes type 2, recent BMI 30  See history of present illness for detailed discussion of his current management, blood sugar patterns and problems identified  He is insulin requiring  Although his A1c is 6.6  not clear this is affected by his renal function and anemia but his blood sugars at home are excellent and averaging only 106 overall Most of his insulin requirement is with the bedtime NPH and has only a couple of readings available for evaluation and these are normal Hypoglycemia may be occurring only rarely now and precipitated by increased activity in the afternoon when he also has a low sugars   PLAN:   DECREASE morning NPH down to 8 units instead of 10  Continue to adjust morning regular insulin based on his planned activity that day  He will mark his readings after meals or before meals at least in the evening and the settings were changed on his meter  More readings fasting also  Follow-up in 3 months again  HYPERLIPIDEMIA: His triglycerides will be checked again on the next visit    Patient Instructions  N insulin 8 units in am  Check blood sugars on waking up  2-3/7 days  Also check blood sugars about 2 hours after a meal and do this after different meals by rotation  Recommended blood sugar levels on waking up is 90-130 and about 2 hours after meal is 130-160  Please bring your blood sugar monitor to each visit, thank you        Elayne Snare 12/15/2017, 11:20 AM   Note: This office note was prepared with Dragon voice recognition system technology. Any transcriptional errors that result from this process are unintentional.

## 2017-12-15 ENCOUNTER — Ambulatory Visit (INDEPENDENT_AMBULATORY_CARE_PROVIDER_SITE_OTHER): Payer: Medicare Other | Admitting: Endocrinology

## 2017-12-15 ENCOUNTER — Encounter: Payer: Self-pay | Admitting: Endocrinology

## 2017-12-15 VITALS — BP 150/60 | HR 66 | Ht 75.0 in | Wt 241.4 lb

## 2017-12-15 DIAGNOSIS — E782 Mixed hyperlipidemia: Secondary | ICD-10-CM | POA: Diagnosis not present

## 2017-12-15 DIAGNOSIS — Z794 Long term (current) use of insulin: Secondary | ICD-10-CM | POA: Diagnosis not present

## 2017-12-15 DIAGNOSIS — E1165 Type 2 diabetes mellitus with hyperglycemia: Secondary | ICD-10-CM

## 2017-12-15 NOTE — Patient Instructions (Addendum)
N insulin 8 units in am  Check blood sugars on waking up  2-3/7 days  Also check blood sugars about 2 hours after a meal and do this after different meals by rotation  Recommended blood sugar levels on waking up is 90-130 and about 2 hours after meal is 130-160  Please bring your blood sugar monitor to each visit, thank you

## 2017-12-16 ENCOUNTER — Encounter: Payer: Self-pay | Admitting: Endocrinology

## 2018-01-08 DIAGNOSIS — I2510 Atherosclerotic heart disease of native coronary artery without angina pectoris: Secondary | ICD-10-CM | POA: Diagnosis not present

## 2018-01-08 DIAGNOSIS — I5032 Chronic diastolic (congestive) heart failure: Secondary | ICD-10-CM | POA: Diagnosis not present

## 2018-01-08 DIAGNOSIS — E1122 Type 2 diabetes mellitus with diabetic chronic kidney disease: Secondary | ICD-10-CM | POA: Diagnosis not present

## 2018-01-08 DIAGNOSIS — I25118 Atherosclerotic heart disease of native coronary artery with other forms of angina pectoris: Secondary | ICD-10-CM | POA: Diagnosis not present

## 2018-01-08 DIAGNOSIS — E785 Hyperlipidemia, unspecified: Secondary | ICD-10-CM | POA: Diagnosis not present

## 2018-01-08 DIAGNOSIS — I5022 Chronic systolic (congestive) heart failure: Secondary | ICD-10-CM | POA: Diagnosis not present

## 2018-01-08 DIAGNOSIS — N184 Chronic kidney disease, stage 4 (severe): Secondary | ICD-10-CM | POA: Diagnosis not present

## 2018-01-08 DIAGNOSIS — N400 Enlarged prostate without lower urinary tract symptoms: Secondary | ICD-10-CM | POA: Diagnosis not present

## 2018-01-08 DIAGNOSIS — I10 Essential (primary) hypertension: Secondary | ICD-10-CM | POA: Diagnosis not present

## 2018-01-08 DIAGNOSIS — Z794 Long term (current) use of insulin: Secondary | ICD-10-CM | POA: Diagnosis not present

## 2018-01-14 DIAGNOSIS — I10 Essential (primary) hypertension: Secondary | ICD-10-CM | POA: Diagnosis not present

## 2018-01-14 DIAGNOSIS — E1122 Type 2 diabetes mellitus with diabetic chronic kidney disease: Secondary | ICD-10-CM | POA: Diagnosis not present

## 2018-01-14 DIAGNOSIS — Z794 Long term (current) use of insulin: Secondary | ICD-10-CM | POA: Diagnosis not present

## 2018-01-14 DIAGNOSIS — N400 Enlarged prostate without lower urinary tract symptoms: Secondary | ICD-10-CM | POA: Diagnosis not present

## 2018-01-14 DIAGNOSIS — I25118 Atherosclerotic heart disease of native coronary artery with other forms of angina pectoris: Secondary | ICD-10-CM | POA: Diagnosis not present

## 2018-01-14 DIAGNOSIS — N183 Chronic kidney disease, stage 3 (moderate): Secondary | ICD-10-CM | POA: Diagnosis not present

## 2018-02-03 DIAGNOSIS — H401131 Primary open-angle glaucoma, bilateral, mild stage: Secondary | ICD-10-CM | POA: Diagnosis not present

## 2018-03-10 ENCOUNTER — Telehealth: Payer: Self-pay | Admitting: Internal Medicine

## 2018-03-10 ENCOUNTER — Encounter: Payer: Self-pay | Admitting: Internal Medicine

## 2018-03-10 ENCOUNTER — Inpatient Hospital Stay (HOSPITAL_BASED_OUTPATIENT_CLINIC_OR_DEPARTMENT_OTHER): Payer: Medicare Other | Admitting: Internal Medicine

## 2018-03-10 ENCOUNTER — Inpatient Hospital Stay: Payer: Medicare Other | Attending: Internal Medicine

## 2018-03-10 VITALS — BP 162/54 | HR 57 | Temp 98.2°F | Resp 22 | Ht 75.0 in | Wt 236.6 lb

## 2018-03-10 DIAGNOSIS — C9110 Chronic lymphocytic leukemia of B-cell type not having achieved remission: Secondary | ICD-10-CM

## 2018-03-10 DIAGNOSIS — N189 Chronic kidney disease, unspecified: Secondary | ICD-10-CM

## 2018-03-10 DIAGNOSIS — N289 Disorder of kidney and ureter, unspecified: Secondary | ICD-10-CM | POA: Insufficient documentation

## 2018-03-10 DIAGNOSIS — D649 Anemia, unspecified: Secondary | ICD-10-CM | POA: Insufficient documentation

## 2018-03-10 DIAGNOSIS — N179 Acute kidney failure, unspecified: Secondary | ICD-10-CM

## 2018-03-10 DIAGNOSIS — I10 Essential (primary) hypertension: Secondary | ICD-10-CM

## 2018-03-10 LAB — CBC WITH DIFFERENTIAL (CANCER CENTER ONLY)
BASOS PCT: 1 %
Basophils Absolute: 0 10*3/uL (ref 0.0–0.1)
EOS ABS: 0.3 10*3/uL (ref 0.0–0.5)
Eosinophils Relative: 3 %
HEMATOCRIT: 28.1 % — AB (ref 38.4–49.9)
Hemoglobin: 9.3 g/dL — ABNORMAL LOW (ref 13.0–17.1)
LYMPHS ABS: 3.7 10*3/uL — AB (ref 0.9–3.3)
Lymphocytes Relative: 40 %
MCH: 29.5 pg (ref 27.2–33.4)
MCHC: 33 g/dL (ref 32.0–36.0)
MCV: 89.5 fL (ref 79.3–98.0)
MONOS PCT: 7 %
Monocytes Absolute: 0.6 10*3/uL (ref 0.1–0.9)
NEUTROS ABS: 4.7 10*3/uL (ref 1.5–6.5)
NEUTROS PCT: 49 %
Platelet Count: 200 10*3/uL (ref 140–400)
RBC: 3.14 MIL/uL — AB (ref 4.20–5.82)
RDW: 14.6 % (ref 11.0–14.6)
WBC: 9.4 10*3/uL (ref 4.0–10.3)

## 2018-03-10 LAB — LACTATE DEHYDROGENASE: LDH: 187 U/L (ref 98–192)

## 2018-03-10 NOTE — Telephone Encounter (Signed)
Scheduled appt per 8/21 los - gave patient AVS

## 2018-03-10 NOTE — Progress Notes (Signed)
[facility] [phone]   [phone]  OFFICE PROGRESS NOTE  Ray Cha, Ray Merritt [address] [place] [address]  DIAGNOSIS: Chronic lymphocytic leukemia diagnosed in July 2015  PRIOR THERAPY: None  CURRENT THERAPY: Observation.  INTERVAL HISTORY: Ray Merritt 82 y.o. male returns to the clinic today for follow-up visit.  The patient is feeling fine today with no specific complaints.  He is followed by Dr. Florene Glen for his hypertension and renal insufficiency.  He is currently on oral iron tablets and has a discussion with Dr. Florene Glen about treatment with Procrit.  The patient denied having any chest pain, shortness of breath, cough or hemoptysis.  He denied having any fever or chills.  He has no nausea, vomiting, diarrhea or constipation.  He had repeat CBC and LDH performed earlier today and he is here for evaluation and discussion of his lab results.   MEDICAL HISTORY: Past Medical History:  Diagnosis Date  . Anemia   . Arthritis   . Cancer ([place]) 2015-present   CLL  . Chronic kidney disease   . Coronary artery disease 04/2016   1 stent  . Diabetes mellitus    insulin dependent   . Enlarged prostate   . Headache   . Hypertension   . Pneumonia     ALLERGIES:  has No Known Allergies.  MEDICATIONS:  Current Outpatient Medications  Medication Sig Dispense Refill  . ALPHAGAN P 0.1 % SOLN Place 2 drops 2 (two) times daily into both eyes.   0  . Ascorbic Acid (VITAMIN C) 1000 MG tablet Take 1,000 mg by mouth daily.    Marland Kitchen aspirin EC 81 MG tablet Take 81 mg by mouth at bedtime.     Marland Kitchen atorvastatin (LIPITOR) 20 MG tablet Take 20 mg by mouth daily.  0  . carvedilol (COREG) 25 MG tablet Take 25 mg by mouth 2 (two) times daily.   0  . cholecalciferol (VITAMIN D) 1000 units tablet Take 1,000 Units by mouth daily.     . cloNIDine (CATAPRES) 0.1 MG tablet Take 0.1 mg by mouth 2 (two) times daily.  0  .  diphenhydramine-acetaminophen (TYLENOL PM) 25-500 MG TABS tablet Take 2 tablets by mouth at bedtime.    . ferrous sulfate 325 (65 FE) MG tablet Take 325 mg by mouth 2 (two) times daily.     . finasteride (PROSCAR) 5 MG tablet Take 5 mg by mouth every evening.     . furosemide (LASIX) 80 MG tablet Take 40 mg by mouth daily.   1  . hydrALAZINE (APRESOLINE) 50 MG tablet Take 50 mg by mouth 3 (three) times daily.  0  . insulin NPH Human (HUMULIN N,NOVOLIN N) 100 UNIT/ML injection Inject 10-26 Units into the skin See admin instructions. Pt uses 10 units in the morning and 26 units in the evening.     . insulin regular (NOVOLIN R,HUMULIN R) 100 units/mL injection Inject 6-7 Units into the skin See admin instructions. 6 units in the morning and 7 units at bedtime.    . isosorbide mononitrate (IMDUR) 120 MG 24 hr tablet Take 120 mg by mouth daily.    . Multiple Vitamin (MULTIVITAMIN WITH MINERALS) TABS tablet Take 1 tablet by mouth daily.    . Omega-3 Fatty Acids (FISH OIL) 1000 MG CAPS Take 1,000 mg by mouth 2 (two) times daily.    . tamsulosin (FLOMAX) 0.4 MG CAPS capsule Take 0.4 mg by mouth  daily after supper.     . Travoprost, BAK Free, (TRAVATAN) 0.004 % SOLN ophthalmic solution Place 1 drop into both eyes at bedtime.    . triamcinolone cream (KENALOG) 0.1 % Apply 1 application topically 2 (two) times daily as needed (for itching/irritation).   0   No current facility-administered medications for this visit.     SURGICAL HISTORY:  Past Surgical History:  Procedure Laterality Date  . AV FISTULA PLACEMENT Left 04/28/2017   Procedure: Left arm ARTERIOVENOUS (AV) FISTULA CREATION;  Surgeon: Angelia Mould, Ray Merritt;  Location: [facility];  Service: Vascular;  Laterality: Left;  . CARDIAC CATHETERIZATION N/A 05/01/2015   Procedure: Left Heart Cath and Coronary Angiography;  Surgeon: Adrian Prows, Ray Merritt;  Location: [facility];  Service: Cardiovascular;  Laterality: N/A;  . CARDIAC CATHETERIZATION   05/01/2015   Procedure: Intravascular Pressure Wire/FFR Study;  Surgeon: Adrian Prows, Ray Merritt;  Location: [facility];  Service: Cardiovascular;;  . CARDIAC CATHETERIZATION N/A 05/22/2015   Procedure: Left Heart Cath and Coronary Angiography;  Surgeon: Adrian Prows, Ray Merritt;  Location: [facility];  Service: Cardiovascular;  Laterality: N/A;  . CARDIAC CATHETERIZATION N/A 05/22/2015   Procedure: Coronary Stent Intervention;  Surgeon: Adrian Prows, Ray Merritt;  Location: [facility];  Service: Cardiovascular;  Laterality: N/A;  . COLONOSCOPY W/ BIOPSIES AND POLYPECTOMY    . SHOULDER SURGERY    . TONSILLECTOMY      REVIEW OF SYSTEMS:  A comprehensive review of systems was negative.   PHYSICAL EXAMINATION: General appearance: alert, cooperative and no distress Head: Normocephalic, without obvious abnormality, atraumatic Neck: no adenopathy, no JVD, supple, symmetrical, trachea midline and thyroid not enlarged, symmetric, no tenderness/mass/nodules Lymph nodes: Cervical, supraclavicular, and axillary nodes normal. Resp: clear to auscultation bilaterally Back: symmetric, no curvature. ROM normal. No CVA tenderness. Cardio: regular rate and rhythm, S1, S2 normal, no murmur, click, rub or gallop GI: soft, non-tender; bowel sounds normal; no masses,  no organomegaly Extremities: extremities normal, atraumatic, no cyanosis or edema  ECOG PERFORMANCE STATUS: 1 - Symptomatic but completely ambulatory  Blood pressure (!) 162/54, pulse (!) 57, temperature 98.2 F (36.8 C), temperature source Oral, resp. rate (!) 22, height 6\' 3"  (1.905 m), weight 236 lb 9.6 oz (107.3 kg), SpO2 99 %.  LABORATORY DATA: Lab Results  Component Value Date   WBC 9.4 03/10/2018   HGB 9.3 (L) 03/10/2018   HCT 28.1 (L) 03/10/2018   MCV 89.5 03/10/2018   PLT 200 03/10/2018      Chemistry      Component Value Date/Time   NA 139 09/10/2017 0948   NA 137 03/10/2017 0848   K 4.3 09/10/2017 0948   K 4.3 03/10/2017 0848    CL 110 (H) 09/10/2017 0948   CO2 17 (L) 09/10/2017 0948   CO2 23 03/10/2017 0848   BUN 75 (H) 09/10/2017 0948   BUN 79.1 (H) 03/10/2017 0848   CREATININE 3.17 (HH) 09/10/2017 0948   CREATININE 3.4 (HH) 03/10/2017 0848      Component Value Date/Time   CALCIUM 9.2 09/10/2017 0948   CALCIUM 9.6 03/10/2017 0848   ALKPHOS 65 09/10/2017 0948   ALKPHOS 64 03/10/2017 0848   AST 11 09/10/2017 0948   AST 13 03/10/2017 0848   ALT 15 09/10/2017 0948   ALT 16 03/10/2017 0848   BILITOT 0.3 09/10/2017 0948   BILITOT 0.38 03/10/2017 0848       RADIOGRAPHIC STUDIES: No results found.  ASSESSMENT AND PLAN:  This  is a very pleasant 82 years old African-American male with history of chronic lymphocytic leukemia. The patient has no complaints today.  CBC performed earlier today showed normal white blood count of 9.4.  He continues to have normocytic anemia likely anemia of chronic disease.  He is followed by Dr. Florene Glen for the anemia of chronic disease as well as renal insufficiency and hypertension. I discussed the lab results with the patient and recommended for him to continue on observation for now with repeat CBC and LDH in 6 months. He was advised to call immediately if he has any concerning symptoms in the interval. The patient voices understanding of current disease status and treatment options and is in agreement with the current care plan. All questions were answered. The patient knows to call the clinic with any problems, questions or concerns. We can certainly see the patient much sooner if necessary. I spent 10 minutes counseling the patient face to face. The total time spent in the appointment was 15 minutes. Disclaimer: This note was dictated with voice recognition software. Similar sounding words can inadvertently be transcribed and may not be corrected upon review.

## 2018-03-15 ENCOUNTER — Other Ambulatory Visit (INDEPENDENT_AMBULATORY_CARE_PROVIDER_SITE_OTHER): Payer: Medicare Other

## 2018-03-15 DIAGNOSIS — Z794 Long term (current) use of insulin: Secondary | ICD-10-CM | POA: Diagnosis not present

## 2018-03-15 DIAGNOSIS — E782 Mixed hyperlipidemia: Secondary | ICD-10-CM

## 2018-03-15 DIAGNOSIS — E1165 Type 2 diabetes mellitus with hyperglycemia: Secondary | ICD-10-CM | POA: Diagnosis not present

## 2018-03-15 LAB — LIPID PANEL
CHOL/HDL RATIO: 3
Cholesterol: 115 mg/dL (ref 0–200)
HDL: 33.2 mg/dL — AB (ref >39.00)
LDL Cholesterol: 62 mg/dL (ref 0–99)
NONHDL: 81.51
Triglycerides: 97 mg/dL (ref 0.0–149.0)
VLDL: 19.4 mg/dL (ref 0.0–40.0)

## 2018-03-15 LAB — HEMOGLOBIN A1C: HEMOGLOBIN A1C: 6.6 % — AB (ref 4.6–6.5)

## 2018-03-15 LAB — GLUCOSE, RANDOM: GLUCOSE: 141 mg/dL — AB (ref 70–99)

## 2018-03-18 ENCOUNTER — Ambulatory Visit (INDEPENDENT_AMBULATORY_CARE_PROVIDER_SITE_OTHER): Payer: Medicare Other | Admitting: Endocrinology

## 2018-03-18 ENCOUNTER — Encounter: Payer: Self-pay | Admitting: Endocrinology

## 2018-03-18 VITALS — BP 162/62 | HR 65 | Ht 75.0 in | Wt 236.0 lb

## 2018-03-18 DIAGNOSIS — Z794 Long term (current) use of insulin: Secondary | ICD-10-CM | POA: Diagnosis not present

## 2018-03-18 DIAGNOSIS — Z23 Encounter for immunization: Secondary | ICD-10-CM | POA: Diagnosis not present

## 2018-03-18 DIAGNOSIS — E1165 Type 2 diabetes mellitus with hyperglycemia: Secondary | ICD-10-CM | POA: Diagnosis not present

## 2018-03-18 MED ORDER — INSULIN REGULAR HUMAN 100 UNIT/ML IJ SOLN: 6.0000 [IU] | INTRAMUSCULAR | 3 refills | Status: DC

## 2018-03-18 NOTE — Patient Instructions (Signed)
6 of NPH in am  Check blood sugars on waking up  3/7  Also check blood sugars about 2 hours after a meal and do this after different meals by rotation  Recommended blood sugar levels on waking up is 90-130 and about 2 hours after meal is 130-160  Please bring your blood sugar monitor to each visit, thank you

## 2018-03-18 NOTE — Progress Notes (Signed)
Patient ID: Ray Merritt, male   DOB: Sep 12, 1935, 81 y.o.   MRN: [id number]           Reason for Appointment: Follow-up for Type 2 Diabetes   History of Present Illness:          Date of diagnosis of type 2 diabetes mellitus : 1982        Background history:   He has been on insulin since the time of diagnosis, initially his blood sugars were significantly high ; he was symptomatic with dry mouth and blurred vision. He thinks he was probably taking NPH and Regular Insulin for quite some time and before his consultation was only on NPH 3 years ago he was put on Bydureon weekly in addition to NPH.  He thinks that it initially curb his appetite but he does not think he lost any significant amount of weight.  Also he does not think his blood sugar control was much better For about a year he had been switched from Bydureon to Tanzeum weekly  His  Tanzeum was stopped because of lack of benefit A1c has been as high as 11.8 in 2/16  Recent history:   INSULIN regimen is : Novolin NPH 8 units acb and 26 units bedtime, Regular Insulin 4-5 units at breakfast and 7 supper   On his initial consultation because of high postprandial readings he was started on Regular Insulin before each meal   A1c is consistent at 6.6   Current blood sugar patterns and problems identified:  On his last visit because of low normal or low sugars around 1-2 PM he was told to take only 8 units of NPH instead of 10 in the morning  With this he has had slight improvement in his blood sugars but he still has in the last month 3 radiation in the 60s and this week the reading of 49 in the early afternoon  Currently does not have any low sugars around suppertime with lowest reading 68  FASTING reading has been checked only 3 or 4 times and these are slightly higher in the 140s  Usually does not check readings after his evening meal consistently  Although he is not doing any physical exercise he is usually taking at  least 1 unit less on his regular insulin when he is planning to go shopping in the morning  His weight has come down 5 pounds since May  Has been very compliant with checking his blood sugars, taking his insulin correctly at the right times and usually tries to take his regular insulin about 20-30 minutes before eating  Other hypoglycemic drugs the patient is taking are:   none Side effects from medications have been: None  Compliance with the medical regimen: Good Hypoglycemia: with activity symptoms are weakness and feeling sweaty, treated with juice or other sweet drinks, usually can recognize blood sugars in the low 60s   Glucose monitoring:  done up to 3 times a day         Glucometer:  Contour       Blood Glucose readings by  review of his monitor download:   PRE-MEAL Fasting Lunch Dinner Bedtime Overall  Glucose range:  130-160   3 8-158    Mean/median: 1 48   105   110   POST-MEAL PC Breakfast PC Lunch PC Dinner  Glucose range:   49-198   Mean/median:   120    Previous readings:  PRE-MEAL Fasting  1-2 PM  6-7  PM Bedtime Overall  Glucose range:  87  64-211  59-199    Mean/median:   116  97  106    Self-care: The diet that the patient has been following is: tries to limit portions .     Typical meal intake: Breakfast is old-fashioned oatmeal at 10 am, lunch is usually a sandwich.  He has mostly popcorn for snacks   Dinner time is usually 6-7              Dietician visit, most recent: none  CDE consultation: 7/16               Exercise: Some walking, mostly when going shopping and some yard work  Weight history:  Wt Readings from Last 3 Encounters:  03/18/18 236 lb (107 kg)  03/10/18 236 lb 9.6 oz (107.3 kg)  12/15/17 241 lb 6.4 oz (109.5 kg)    Glycemic control:   Lab Results  Component Value Date   HGBA1C 6.6 (H) 03/15/2018   HGBA1C 6.6 (H) 12/11/2017   HGBA1C 7.2 (H) 08/17/2017   Lab Results  Component Value Date   MICROALBUR 48.1 (H) 12/11/2017    LDLCALC 62 03/15/2018   CREATININE 3.17 (HH) 09/10/2017      Allergies as of 03/18/2018   No Known Allergies     Medication List        Accurate as of 03/18/18 10:59 AM. Always use your most recent med list.          ALPHAGAN P 0.1 % Soln Generic drug:  brimonidine Place 2 drops 2 (two) times daily into both eyes.   aspirin EC 81 MG tablet Take 81 mg by mouth at bedtime.   atorvastatin 20 MG tablet Commonly known as:  LIPITOR Take 20 mg by mouth daily.   carvedilol 25 MG tablet Commonly known as:  COREG Take 25 mg by mouth 2 (two) times daily.   cholecalciferol 1000 units tablet Commonly known as:  VITAMIN D Take 1,000 Units by mouth daily.   cloNIDine 0.1 MG tablet Commonly known as:  CATAPRES Take 0.1 mg by mouth 2 (two) times daily.   diphenhydramine-acetaminophen 25-500 MG Tabs tablet Commonly known as:  TYLENOL PM Take 2 tablets by mouth at bedtime.   ferrous sulfate 325 (65 FE) MG tablet Take 325 mg by mouth 2 (two) times daily.   finasteride 5 MG tablet Commonly known as:  PROSCAR Take 5 mg by mouth every evening.   Fish Oil 1000 MG Caps Take 1,000 mg by mouth 2 (two) times daily.   furosemide 80 MG tablet Commonly known as:  LASIX Take 40 mg by mouth daily.   hydrALAZINE 50 MG tablet Commonly known as:  APRESOLINE Take 50 mg by mouth 3 (three) times daily.   insulin NPH Human 100 UNIT/ML injection Commonly known as:  HUMULIN N,NOVOLIN N Inject 10-26 Units into the skin See admin instructions. Pt uses 10 units in the morning and 26 units in the evening.   insulin regular 100 units/mL injection Commonly known as:  NOVOLIN R,HUMULIN R Inject 6-7 Units into the skin See admin instructions. 6 units in the morning and 7 units at bedtime.   isosorbide mononitrate 120 MG 24 hr tablet Commonly known as:  IMDUR Take 120 mg by mouth daily.   multivitamin with minerals Tabs tablet Take 1 tablet by mouth daily.   tamsulosin 0.4 MG Caps  capsule Commonly known as:  FLOMAX Take 0.4 mg by mouth daily after supper.  Travoprost (BAK Free) 0.004 % Soln ophthalmic solution Commonly known as:  TRAVATAN Place 1 drop into both eyes at bedtime.   triamcinolone cream 0.1 % Commonly known as:  KENALOG Apply 1 application topically 2 (two) times daily as needed (for itching/irritation).   vitamin C 1000 MG tablet Take 1,000 mg by mouth daily.       Allergies: No Known Allergies  Past Medical History:  Diagnosis Date  . Anemia   . Arthritis   . Cancer ([place]) 2015-present   CLL  . Chronic kidney disease   . Coronary artery disease 04/2016   1 stent  . Diabetes mellitus    insulin dependent   . Enlarged prostate   . Headache   . Hypertension   . Pneumonia     Past Surgical History:  Procedure Laterality Date  . AV FISTULA PLACEMENT Left 04/28/2017   Procedure: Left arm ARTERIOVENOUS (AV) FISTULA CREATION;  Surgeon: Angelia Mould, MD;  Location: [facility];  Service: Vascular;  Laterality: Left;  . CARDIAC CATHETERIZATION N/A 05/01/2015   Procedure: Left Heart Cath and Coronary Angiography;  Surgeon: Adrian Prows, MD;  Location: [facility];  Service: Cardiovascular;  Laterality: N/A;  . CARDIAC CATHETERIZATION  05/01/2015   Procedure: Intravascular Pressure Wire/FFR Study;  Surgeon: Adrian Prows, MD;  Location: [facility];  Service: Cardiovascular;;  . CARDIAC CATHETERIZATION N/A 05/22/2015   Procedure: Left Heart Cath and Coronary Angiography;  Surgeon: Adrian Prows, MD;  Location: [facility];  Service: Cardiovascular;  Laterality: N/A;  . CARDIAC CATHETERIZATION N/A 05/22/2015   Procedure: Coronary Stent Intervention;  Surgeon: Adrian Prows, MD;  Location: [facility];  Service: Cardiovascular;  Laterality: N/A;  . COLONOSCOPY W/ BIOPSIES AND POLYPECTOMY    . SHOULDER SURGERY    . TONSILLECTOMY      Family History  Problem Relation Age of Onset  . Hypertension Mother   . Heart disease  Sister   . Stroke Sister   . Diabetes Brother     Social History:  reports that he has quit smoking. He has never used smokeless tobacco. He reports that he does not drink alcohol or use drugs.    Review of Systems   THYROID nodule:    He has a dominant left-sided thyroid nodule and did have a biopsy done Results as follows: BENIGN FOLLICULAR CELLS, COLLOID AND MACROPHAGES, CONSISTENT WITH  BENIGN GOITROUS NODULE  Last TSH was normal and his last exam showed a 3 cm nodule clinically in 1/19     Lipid history: Has been treated with Lipitor, followed by PCP and cardiologist  LDL consistently below 70  With increasing his Lovaza from 1 up to 4 capsules daily his triglycerides are controlled  Lab Results  Component Value Date   CHOL 115 03/15/2018   CHOL 117 08/17/2017   CHOL 129 05/12/2017   Lab Results  Component Value Date   HDL 33.20 (L) 03/15/2018   HDL 32.00 (L) 08/17/2017   HDL 27.30 (L) 05/12/2017   Lab Results  Component Value Date   LDLCALC 62 03/15/2018   LDLCALC 65 08/17/2017   LDLCALC 83 05/12/2016   Lab Results  Component Value Date   TRIG 97.0 03/15/2018   TRIG 102.0 08/17/2017   TRIG 300.0 (H) 05/12/2017   Lab Results  Component Value Date   CHOLHDL 3 03/15/2018   CHOLHDL 4 08/17/2017   CHOLHDL 5 05/12/2017   Lab Results  Component Value Date  LDLDIRECT 60.0 05/12/2017     HYPERTENSION: Followed by cardiologist          Eyes: Marland Kitchen  Most recent eye exam was in 10/2017 and he is going regularly  RENAL dysfunction: He is followed by nephrologist,     Lab Results  Component Value Date   CREATININE 3.17 ([place]) 09/10/2017   CREATININE 3.27 (H) 08/17/2017   CREATININE 3.85 (H) 06/07/2017     Physical Examination:  BP (!) 162/62   Pulse 65   Ht 6\' 3"  (1.905 m)   Wt 236 lb (107 kg)   BMI 29.50 kg/m      Diabetic Foot Exam - Simple   Simple Foot Form Diabetic Foot exam was performed with the following findings:  Yes 03/18/2018  11:10 AM  Visual Inspection No deformities, no ulcerations, no other skin breakdown bilaterally:  Yes See comments:  Yes Sensation Testing Intact to touch and monofilament testing bilaterally:  Yes Pulse Check Posterior Tibialis and Dorsalis pulse intact bilaterally:  Yes Comments Onychmycosis toes        ASSESSMENT:  Diabetes type 2 on insulin  See history of present illness for detailed discussion of his current management, blood sugar patterns and problems identified  He is on insulin with NPH and regular formulations with syringes  Again A1c excellent at 6.6  Blood sugars are excellent at home with mostly normal or low normal readings although checking blood sugars mostly after lunch or before suppertime He is sensitive to insulin especially in the morning but requires more insulin in the evening Even with reducing his NPH to 8 units he still has low normal or low sugars in the afternoon  PLAN:   He will again decrease the morning NPH down to 6 units instead of 8 units  However he will need to make sure he calls if he continues to get blood sugars in the 60s  Needs to check more readings after supper  No change in other insulin doses including the evening dose unless fasting readings start going up  Continue balanced meals with some protein at breakfast  Follow-up in 3 months again  HYPERLIPIDEMIA: His triglycerides are normal HDL relatively better and LDL is excellent  HYPERTENSION: He will follow-up with his cardiologist  High-dose influenza vaccine given   There are no Patient Instructions on file for this visit.      Elayne Snare 03/18/2018, 10:59 AM   Note: This office note was prepared with Dragon voice recognition system technology. Any transcriptional errors that result from this process are unintentional.

## 2018-04-08 DIAGNOSIS — I5022 Chronic systolic (congestive) heart failure: Secondary | ICD-10-CM | POA: Diagnosis not present

## 2018-04-08 DIAGNOSIS — D638 Anemia in other chronic diseases classified elsewhere: Secondary | ICD-10-CM | POA: Diagnosis not present

## 2018-04-08 DIAGNOSIS — E785 Hyperlipidemia, unspecified: Secondary | ICD-10-CM | POA: Diagnosis not present

## 2018-04-08 DIAGNOSIS — H409 Unspecified glaucoma: Secondary | ICD-10-CM | POA: Diagnosis not present

## 2018-04-08 DIAGNOSIS — Z794 Long term (current) use of insulin: Secondary | ICD-10-CM | POA: Diagnosis not present

## 2018-04-08 DIAGNOSIS — E1122 Type 2 diabetes mellitus with diabetic chronic kidney disease: Secondary | ICD-10-CM | POA: Diagnosis not present

## 2018-04-08 DIAGNOSIS — I25118 Atherosclerotic heart disease of native coronary artery with other forms of angina pectoris: Secondary | ICD-10-CM | POA: Diagnosis not present

## 2018-04-08 DIAGNOSIS — I5032 Chronic diastolic (congestive) heart failure: Secondary | ICD-10-CM | POA: Diagnosis not present

## 2018-04-08 DIAGNOSIS — N184 Chronic kidney disease, stage 4 (severe): Secondary | ICD-10-CM | POA: Diagnosis not present

## 2018-04-08 DIAGNOSIS — I2510 Atherosclerotic heart disease of native coronary artery without angina pectoris: Secondary | ICD-10-CM | POA: Diagnosis not present

## 2018-04-08 DIAGNOSIS — I10 Essential (primary) hypertension: Secondary | ICD-10-CM | POA: Diagnosis not present

## 2018-04-09 DIAGNOSIS — I25119 Atherosclerotic heart disease of native coronary artery with unspecified angina pectoris: Secondary | ICD-10-CM | POA: Diagnosis not present

## 2018-04-09 DIAGNOSIS — I10 Essential (primary) hypertension: Secondary | ICD-10-CM | POA: Diagnosis not present

## 2018-04-09 DIAGNOSIS — E1122 Type 2 diabetes mellitus with diabetic chronic kidney disease: Secondary | ICD-10-CM | POA: Diagnosis not present

## 2018-04-09 DIAGNOSIS — I5032 Chronic diastolic (congestive) heart failure: Secondary | ICD-10-CM | POA: Diagnosis not present

## 2018-04-12 DIAGNOSIS — I129 Hypertensive chronic kidney disease with stage 1 through stage 4 chronic kidney disease, or unspecified chronic kidney disease: Secondary | ICD-10-CM | POA: Diagnosis not present

## 2018-04-12 DIAGNOSIS — N183 Chronic kidney disease, stage 3 (moderate): Secondary | ICD-10-CM | POA: Diagnosis not present

## 2018-04-12 DIAGNOSIS — D649 Anemia, unspecified: Secondary | ICD-10-CM | POA: Diagnosis not present

## 2018-04-12 DIAGNOSIS — N184 Chronic kidney disease, stage 4 (severe): Secondary | ICD-10-CM | POA: Diagnosis not present

## 2018-04-12 DIAGNOSIS — E8770 Fluid overload, unspecified: Secondary | ICD-10-CM | POA: Diagnosis not present

## 2018-04-12 DIAGNOSIS — N2581 Secondary hyperparathyroidism of renal origin: Secondary | ICD-10-CM | POA: Diagnosis not present

## 2018-04-12 DIAGNOSIS — C9190 Lymphoid leukemia, unspecified not having achieved remission: Secondary | ICD-10-CM | POA: Diagnosis not present

## 2018-04-12 DIAGNOSIS — I770 Arteriovenous fistula, acquired: Secondary | ICD-10-CM | POA: Diagnosis not present

## 2018-04-30 DIAGNOSIS — R0989 Other specified symptoms and signs involving the circulatory and respiratory systems: Secondary | ICD-10-CM | POA: Diagnosis not present

## 2018-05-03 ENCOUNTER — Other Ambulatory Visit (HOSPITAL_COMMUNITY): Payer: Self-pay | Admitting: *Deleted

## 2018-05-04 ENCOUNTER — Ambulatory Visit (HOSPITAL_COMMUNITY)
Admission: RE | Admit: 2018-05-04 | Discharge: 2018-05-04 | Disposition: A | Discharge status: Home / Self Care | Payer: Medicare Other | Source: Ambulatory Visit | Attending: Nephrology | Admitting: Nephrology

## 2018-05-04 DIAGNOSIS — D631 Anemia in chronic kidney disease: Secondary | ICD-10-CM | POA: Diagnosis not present

## 2018-05-04 DIAGNOSIS — N189 Chronic kidney disease, unspecified: Secondary | ICD-10-CM | POA: Diagnosis not present

## 2018-05-04 MED ORDER — SODIUM CHLORIDE 0.9 % IV SOLN
510.0000 mg | INTRAVENOUS | Status: DC
  Administered 2018-05-04: 510 mg via INTRAVENOUS
  Filled 2018-05-04: qty 17

## 2018-05-04 NOTE — Discharge Instructions (Signed)

## 2018-05-10 DIAGNOSIS — H401131 Primary open-angle glaucoma, bilateral, mild stage: Secondary | ICD-10-CM | POA: Diagnosis not present

## 2018-05-11 ENCOUNTER — Encounter (HOSPITAL_COMMUNITY): Payer: Medicare Other

## 2018-05-12 ENCOUNTER — Ambulatory Visit (HOSPITAL_COMMUNITY)
Admission: RE | Admit: 2018-05-12 | Discharge: 2018-05-12 | Disposition: A | Discharge status: Home / Self Care | Payer: Medicare Other | Source: Ambulatory Visit | Attending: Nephrology | Admitting: Nephrology

## 2018-05-12 DIAGNOSIS — N189 Chronic kidney disease, unspecified: Secondary | ICD-10-CM | POA: Diagnosis not present

## 2018-05-12 DIAGNOSIS — D631 Anemia in chronic kidney disease: Secondary | ICD-10-CM | POA: Diagnosis not present

## 2018-05-12 MED ORDER — SODIUM CHLORIDE 0.9 % IV SOLN
510.0000 mg | INTRAVENOUS | Status: DC
  Administered 2018-05-12: 510 mg via INTRAVENOUS
  Filled 2018-05-12: qty 17

## 2018-05-31 DIAGNOSIS — N184 Chronic kidney disease, stage 4 (severe): Secondary | ICD-10-CM | POA: Diagnosis not present

## 2018-05-31 DIAGNOSIS — D638 Anemia in other chronic diseases classified elsewhere: Secondary | ICD-10-CM | POA: Diagnosis not present

## 2018-05-31 DIAGNOSIS — E1122 Type 2 diabetes mellitus with diabetic chronic kidney disease: Secondary | ICD-10-CM | POA: Diagnosis not present

## 2018-05-31 DIAGNOSIS — Z794 Long term (current) use of insulin: Secondary | ICD-10-CM | POA: Diagnosis not present

## 2018-05-31 DIAGNOSIS — R972 Elevated prostate specific antigen [PSA]: Secondary | ICD-10-CM | POA: Diagnosis not present

## 2018-05-31 DIAGNOSIS — C9000 Multiple myeloma not having achieved remission: Secondary | ICD-10-CM | POA: Diagnosis not present

## 2018-05-31 DIAGNOSIS — Z0000 Encounter for general adult medical examination without abnormal findings: Secondary | ICD-10-CM | POA: Diagnosis not present

## 2018-05-31 DIAGNOSIS — H409 Unspecified glaucoma: Secondary | ICD-10-CM | POA: Diagnosis not present

## 2018-05-31 DIAGNOSIS — Z1389 Encounter for screening for other disorder: Secondary | ICD-10-CM | POA: Diagnosis not present

## 2018-05-31 DIAGNOSIS — I25118 Atherosclerotic heart disease of native coronary artery with other forms of angina pectoris: Secondary | ICD-10-CM | POA: Diagnosis not present

## 2018-05-31 DIAGNOSIS — I10 Essential (primary) hypertension: Secondary | ICD-10-CM | POA: Diagnosis not present

## 2018-06-08 DIAGNOSIS — R351 Nocturia: Secondary | ICD-10-CM | POA: Diagnosis not present

## 2018-06-08 DIAGNOSIS — N401 Enlarged prostate with lower urinary tract symptoms: Secondary | ICD-10-CM | POA: Diagnosis not present

## 2018-06-08 DIAGNOSIS — R8279 Other abnormal findings on microbiological examination of urine: Secondary | ICD-10-CM | POA: Diagnosis not present

## 2018-06-26 ENCOUNTER — Other Ambulatory Visit: Payer: Self-pay | Admitting: Endocrinology

## 2018-07-16 ENCOUNTER — Other Ambulatory Visit (INDEPENDENT_AMBULATORY_CARE_PROVIDER_SITE_OTHER): Payer: Medicare Other

## 2018-07-16 DIAGNOSIS — Z794 Long term (current) use of insulin: Secondary | ICD-10-CM | POA: Diagnosis not present

## 2018-07-16 DIAGNOSIS — E1165 Type 2 diabetes mellitus with hyperglycemia: Secondary | ICD-10-CM

## 2018-07-16 LAB — GLUCOSE, RANDOM: Glucose, Bld: 76 mg/dL (ref 70–99)

## 2018-07-16 LAB — HEMOGLOBIN A1C: Hgb A1c MFr Bld: 6.1 % (ref 4.6–6.5)

## 2018-07-20 ENCOUNTER — Ambulatory Visit (INDEPENDENT_AMBULATORY_CARE_PROVIDER_SITE_OTHER): Payer: Medicare Other | Admitting: Endocrinology

## 2018-07-20 ENCOUNTER — Encounter: Payer: Self-pay | Admitting: Endocrinology

## 2018-07-20 VITALS — BP 138/68 | HR 96 | Ht 75.0 in | Wt 239.4 lb

## 2018-07-20 DIAGNOSIS — E782 Mixed hyperlipidemia: Secondary | ICD-10-CM | POA: Diagnosis not present

## 2018-07-20 DIAGNOSIS — E1165 Type 2 diabetes mellitus with hyperglycemia: Secondary | ICD-10-CM | POA: Diagnosis not present

## 2018-07-20 DIAGNOSIS — Z794 Long term (current) use of insulin: Secondary | ICD-10-CM | POA: Diagnosis not present

## 2018-07-20 NOTE — Patient Instructions (Addendum)
Bedtime N dose to 22 units and none in am

## 2018-07-20 NOTE — Progress Notes (Signed)
Patient ID: Ray Merritt, male   DOB: 10/24/1935, 82 y.o.   MRN: [id number]           Reason for Appointment: Follow-up for Type 2 Diabetes   History of Present Illness:          Date of diagnosis of type 2 diabetes mellitus : 1982        Background history:   He has been on insulin since the time of diagnosis, initially his blood sugars were significantly high ; he was symptomatic with dry mouth and blurred vision. He thinks he was probably taking NPH and Regular Insulin for quite some time and before his consultation was only on NPH 3 years ago he was put on Bydureon weekly in addition to NPH.  He thinks that it initially curb his appetite but he does not think he lost any significant amount of weight.  Also he does not think his blood sugar control was much better Tanzeum was stopped because of lack of benefit  A1c has been as high as 11.8 in 2/16  Recent history:   INSULIN regimen is : Novolin NPH 6 units acb and 26 units bedtime, Regular Insulin 4-5 units at breakfast and 7 supper     A1c is improved at 6.1, previously was at 6.6   Current blood sugar patterns and problems identified:  On his last visit he was told to reduce his NPH down to 6 units and although he initially stated that he is taking 8 units he thinks he is taking 6 units now  However even with gradual reduction of his NPH in the morning he still gets blood sugars as low as 54 midday  Also average blood sugar before lunch is only 86  However has occasional relatively high readings before dinner but not consistently  FASTING blood sugars have been relatively low and averaging below 100 without overnight symptoms of hypoglycemia  Most of his insulin is taken at bedtime with NPH, taking this consistently  He has done a few readings after dinner and they seem to be only mildly increased  He may reduce his morning insulin dose by 1-2 units if he is planning to be more active  He is mostly trying to be  active with walking at [place] or the mall, usually not doing much yard work during winter  His weight is up 3 pounds  Other hypoglycemic drugs the patient is taking are:   none Side effects from medications have been: None  Compliance with the medical regimen: Good Hypoglycemia: with activity symptoms are weakness and feeling sweaty, treated with juice or other sweet drinks, usually can recognize blood sugars in the low 60s   Glucose monitoring:  done up to 3 times a day         Glucometer:  Contour       Blood Glucose readings by  review of his monitor download:   PRE-MEAL Fasting Lunch Dinner Bedtime Overall  Glucose range:  63-125   83-233    Mean/median:  88   125   117+/-40   POST-MEAL PC Breakfast PC Lunch PC Dinner  Glucose range:   83-191  109-202  Mean/median:   138  168    PRE-MEAL Fasting Lunch Dinner Bedtime Overall  Glucose range:  130-160   3 8-158    Mean/median: 1 48   105   110   POST-MEAL PC Breakfast PC Lunch PC Dinner  Glucose range:   49-198   Mean/median:  120      Self-care: The diet that the patient has been following is: tries to limit portions .     Typical meal intake: Breakfast is old-fashioned oatmeal at 10 am, lunch is usually a sandwich.  He has mostly popcorn for snacks   Dinner time is usually 6-7              Dietician visit, most recent: none  CDE consultation: 7/16               Exercise: Some walking, mostly when going shopping   Weight history:  Wt Readings from Last 3 Encounters:  07/20/18 239 lb 6.4 oz (108.6 kg)  05/12/18 236 lb (107 kg)  03/18/18 236 lb (107 kg)    Glycemic control:   Lab Results  Component Value Date   HGBA1C 6.1 07/16/2018   HGBA1C 6.6 (H) 03/15/2018   HGBA1C 6.6 (H) 12/11/2017   Lab Results  Component Value Date   MICROALBUR 48.1 (H) 12/11/2017   LDLCALC 62 03/15/2018   CREATININE 3.17 ([place]) 09/10/2017      Allergies as of 07/20/2018   No Known Allergies     Medication List        Accurate as of July 20, 2018 11:52 AM. Always use your most recent med list.        ALPHAGAN P 0.1 % Soln Generic drug:  brimonidine Place 2 drops 2 (two) times daily into both eyes.   aspirin EC 81 MG tablet Take 81 mg by mouth at bedtime.   atorvastatin 20 MG tablet Commonly known as:  LIPITOR Take 20 mg by mouth daily.   carvedilol 25 MG tablet Commonly known as:  COREG Take 25 mg by mouth 2 (two) times daily.   cholecalciferol 1000 units tablet Commonly known as:  VITAMIN D Take 1,000 Units by mouth daily.   cloNIDine 0.1 MG tablet Commonly known as:  CATAPRES Take 0.1 mg by mouth 2 (two) times daily.   CONTOUR NEXT TEST test strip Generic drug:  glucose blood USE AS DIRECTED TO CHECK BLOOD SUGAR 2 TIMES PER DAY   diphenhydramine-acetaminophen 25-500 MG Tabs tablet Commonly known as:  TYLENOL PM Take 2 tablets by mouth at bedtime.   ferrous sulfate 325 (65 FE) MG tablet Take 325 mg by mouth 2 (two) times daily.   finasteride 5 MG tablet Commonly known as:  PROSCAR Take 5 mg by mouth every evening.   Fish Oil 1000 MG Caps Take 1,000 mg by mouth 2 (two) times daily.   furosemide 80 MG tablet Commonly known as:  LASIX Take 40 mg by mouth daily.   hydrALAZINE 50 MG tablet Commonly known as:  APRESOLINE Take 50 mg by mouth 3 (three) times daily.   insulin NPH Human 100 UNIT/ML injection Commonly known as:  HUMULIN N,NOVOLIN N Inject 7-26 Units into the skin See admin instructions. Pt uses 10 units in the morning and 26 units in the evening.   insulin regular 100 units/mL injection Commonly known as:  NOVOLIN R,HUMULIN R Inject 0.06-0.07 mLs (6-7 Units total) into the skin See admin instructions. 5 units in the morning and 7 units at dinner.   isosorbide mononitrate 120 MG 24 hr tablet Commonly known as:  IMDUR Take 120 mg by mouth daily.   multivitamin with minerals Tabs tablet Take 1 tablet by mouth daily.   tamsulosin 0.4 MG Caps  capsule Commonly known as:  FLOMAX Take 0.4 mg by mouth daily after supper.  Travoprost (BAK Free) 0.004 % Soln ophthalmic solution Commonly known as:  TRAVATAN Place 1 drop into both eyes at bedtime.   triamcinolone cream 0.1 % Commonly known as:  KENALOG Apply 1 application topically 2 (two) times daily as needed (for itching/irritation).   vitamin C 1000 MG tablet Take 1,000 mg by mouth daily.       Allergies: No Known Allergies  Past Medical History:  Diagnosis Date  . Anemia   . Arthritis   . Cancer ([place]) 2015-present   CLL  . Chronic kidney disease   . Coronary artery disease 04/2016   1 stent  . Diabetes mellitus    insulin dependent   . Enlarged prostate   . Headache   . Hypertension   . Pneumonia     Past Surgical History:  Procedure Laterality Date  . AV FISTULA PLACEMENT Left 04/28/2017   Procedure: Left arm ARTERIOVENOUS (AV) FISTULA CREATION;  Surgeon: Angelia Mould, MD;  Location: [facility];  Service: Vascular;  Laterality: Left;  . CARDIAC CATHETERIZATION N/A 05/01/2015   Procedure: Left Heart Cath and Coronary Angiography;  Surgeon: Adrian Prows, MD;  Location: [facility];  Service: Cardiovascular;  Laterality: N/A;  . CARDIAC CATHETERIZATION  05/01/2015   Procedure: Intravascular Pressure Wire/FFR Study;  Surgeon: Adrian Prows, MD;  Location: [facility];  Service: Cardiovascular;;  . CARDIAC CATHETERIZATION N/A 05/22/2015   Procedure: Left Heart Cath and Coronary Angiography;  Surgeon: Adrian Prows, MD;  Location: [facility];  Service: Cardiovascular;  Laterality: N/A;  . CARDIAC CATHETERIZATION N/A 05/22/2015   Procedure: Coronary Stent Intervention;  Surgeon: Adrian Prows, MD;  Location: [facility];  Service: Cardiovascular;  Laterality: N/A;  . COLONOSCOPY W/ BIOPSIES AND POLYPECTOMY    . SHOULDER SURGERY    . TONSILLECTOMY      Family History  Problem Relation Age of Onset  . Hypertension Mother   . Heart disease  Sister   . Stroke Sister   . Diabetes Brother     Social History:  reports that he has quit smoking. He has never used smokeless tobacco. He reports that he does not drink alcohol or use drugs.    Review of Systems   THYROID nodule:    He has a dominant left-sided thyroid nodule and did have a biopsy done Results as follows: BENIGN FOLLICULAR CELLS, COLLOID AND MACROPHAGES, CONSISTENT WITH  BENIGN GOITROUS NODULE  Last TSH was normal and his last exam showed a 3 cm nodule clinically in 1/19     Lipid history: Has been treated with Lipitor, followed by PCP and cardiologist  LDL consistently below 70  With increasing his Lovaza from 1 up to 4 capsules daily his triglycerides are controlled  Lab Results  Component Value Date   CHOL 115 03/15/2018   CHOL 117 08/17/2017   CHOL 129 05/12/2017   Lab Results  Component Value Date   HDL 33.20 (L) 03/15/2018   HDL 32.00 (L) 08/17/2017   HDL 27.30 (L) 05/12/2017   Lab Results  Component Value Date   LDLCALC 62 03/15/2018   LDLCALC 65 08/17/2017   LDLCALC 83 05/12/2016   Lab Results  Component Value Date   TRIG 97.0 03/15/2018   TRIG 102.0 08/17/2017   TRIG 300.0 (H) 05/12/2017   Lab Results  Component Value Date   CHOLHDL 3 03/15/2018   CHOLHDL 4 08/17/2017   CHOLHDL 5 05/12/2017   Lab Results  Component Value Date  LDLDIRECT 60.0 05/12/2017     HYPERTENSION: Followed by cardiologist          Eyes: Marland Kitchen  Most recent eye exam was in 10/2017 and he is going regularly  RENAL dysfunction: He is followed by nephrologist,   Last creatinine was 3.5 done in 11/19  Lab Results  Component Value Date   CREATININE 3.17 ([facility]) 09/10/2017   CREATININE 3.27 (H) 08/17/2017   CREATININE 3.85 (H) 06/07/2017     Physical Examination:  BP 138/68 (BP Location: Right Arm, Patient Position: Sitting, Cuff Size: Normal)   Pulse 96   Ht 6\' 3"  (1.905 m)   Wt 239 lb 6.4 oz (108.6 kg)   SpO2 99%   BMI 29.92 kg/m         ASSESSMENT:  Diabetes type 2 on insulin  See history of present illness for detailed discussion of his current management, blood sugar patterns and problems identified  He is on insulin with NPH and regular formulations with syringes  Again A1c excellent at 6.1 He does have renal failure and not clear if hemoglobin A1c is consistent from variability in renal function May however be requiring less insulin because of his creatinine being higher compared to earlier this year Considering his age he probably has excessive hypoglycemia midday and occasionally in the mornings on waking up from his NPH insulin Most of his sugars after dinner are fairly well-controlled He is usually trying to be as active as possible  PLAN:  He will try to stop NPH in the morning Will reduce NPH at bedtime to 22 units instead of 26 to avoid overnight hypoglycemia However he will call if he is having tendency to high readings or if he continues to have low sugars during the day He may consider doing regular insulin at lunchtime for certain meals if he has consistently high readings in the afternoon No change in regular insulin at this time  CKD: Continue follow-up with nephrologist     Patient Instructions  Bedtime N dose to 22 units and none in am  Addendum: Blood sugar patterns as follows:       Elayne Snare 07/20/2018, 11:52 AM   Note: This office note was prepared with Dragon voice recognition system technology. Any transcriptional errors that result from this process are unintentional.

## 2018-08-10 DIAGNOSIS — N184 Chronic kidney disease, stage 4 (severe): Secondary | ICD-10-CM | POA: Diagnosis not present

## 2018-08-10 DIAGNOSIS — N189 Chronic kidney disease, unspecified: Secondary | ICD-10-CM | POA: Diagnosis not present

## 2018-08-16 DIAGNOSIS — E119 Type 2 diabetes mellitus without complications: Secondary | ICD-10-CM | POA: Diagnosis not present

## 2018-08-16 DIAGNOSIS — N400 Enlarged prostate without lower urinary tract symptoms: Secondary | ICD-10-CM | POA: Diagnosis not present

## 2018-08-16 DIAGNOSIS — I10 Essential (primary) hypertension: Secondary | ICD-10-CM | POA: Diagnosis not present

## 2018-08-16 DIAGNOSIS — I25118 Atherosclerotic heart disease of native coronary artery with other forms of angina pectoris: Secondary | ICD-10-CM | POA: Diagnosis not present

## 2018-08-16 DIAGNOSIS — I5032 Chronic diastolic (congestive) heart failure: Secondary | ICD-10-CM | POA: Diagnosis not present

## 2018-08-16 DIAGNOSIS — E785 Hyperlipidemia, unspecified: Secondary | ICD-10-CM | POA: Diagnosis not present

## 2018-08-16 DIAGNOSIS — I5022 Chronic systolic (congestive) heart failure: Secondary | ICD-10-CM | POA: Diagnosis not present

## 2018-08-16 DIAGNOSIS — E1122 Type 2 diabetes mellitus with diabetic chronic kidney disease: Secondary | ICD-10-CM | POA: Diagnosis not present

## 2018-08-16 DIAGNOSIS — I2510 Atherosclerotic heart disease of native coronary artery without angina pectoris: Secondary | ICD-10-CM | POA: Diagnosis not present

## 2018-08-16 DIAGNOSIS — H409 Unspecified glaucoma: Secondary | ICD-10-CM | POA: Diagnosis not present

## 2018-08-16 DIAGNOSIS — N184 Chronic kidney disease, stage 4 (severe): Secondary | ICD-10-CM | POA: Diagnosis not present

## 2018-08-16 DIAGNOSIS — D638 Anemia in other chronic diseases classified elsewhere: Secondary | ICD-10-CM | POA: Diagnosis not present

## 2018-08-17 DIAGNOSIS — I129 Hypertensive chronic kidney disease with stage 1 through stage 4 chronic kidney disease, or unspecified chronic kidney disease: Secondary | ICD-10-CM | POA: Diagnosis not present

## 2018-08-17 DIAGNOSIS — N2581 Secondary hyperparathyroidism of renal origin: Secondary | ICD-10-CM | POA: Diagnosis not present

## 2018-08-17 DIAGNOSIS — N184 Chronic kidney disease, stage 4 (severe): Secondary | ICD-10-CM | POA: Diagnosis not present

## 2018-08-17 DIAGNOSIS — D631 Anemia in chronic kidney disease: Secondary | ICD-10-CM | POA: Diagnosis not present

## 2018-09-01 DIAGNOSIS — H401131 Primary open-angle glaucoma, bilateral, mild stage: Secondary | ICD-10-CM | POA: Diagnosis not present

## 2018-09-09 ENCOUNTER — Encounter: Payer: Self-pay | Admitting: Internal Medicine

## 2018-09-09 ENCOUNTER — Inpatient Hospital Stay: Payer: Medicare Other

## 2018-09-09 ENCOUNTER — Inpatient Hospital Stay: Payer: Medicare Other | Attending: Internal Medicine | Admitting: Internal Medicine

## 2018-09-09 ENCOUNTER — Telehealth: Payer: Self-pay | Admitting: Internal Medicine

## 2018-09-09 VITALS — BP 167/56 | HR 64 | Temp 97.7°F | Resp 19 | Ht 75.0 in | Wt 233.4 lb

## 2018-09-09 DIAGNOSIS — I10 Essential (primary) hypertension: Secondary | ICD-10-CM | POA: Diagnosis not present

## 2018-09-09 DIAGNOSIS — C9110 Chronic lymphocytic leukemia of B-cell type not having achieved remission: Secondary | ICD-10-CM | POA: Diagnosis not present

## 2018-09-09 DIAGNOSIS — D638 Anemia in other chronic diseases classified elsewhere: Secondary | ICD-10-CM | POA: Diagnosis not present

## 2018-09-09 DIAGNOSIS — N289 Disorder of kidney and ureter, unspecified: Secondary | ICD-10-CM | POA: Diagnosis not present

## 2018-09-09 LAB — CBC WITH DIFFERENTIAL (CANCER CENTER ONLY)
Abs Immature Granulocytes: 0.02 10*3/uL (ref 0.00–0.07)
BASOS ABS: 0 10*3/uL (ref 0.0–0.1)
Basophils Relative: 0 %
Eosinophils Absolute: 0.3 10*3/uL (ref 0.0–0.5)
Eosinophils Relative: 3 %
HEMATOCRIT: 28.3 % — AB (ref 39.0–52.0)
Hemoglobin: 9.1 g/dL — ABNORMAL LOW (ref 13.0–17.0)
Immature Granulocytes: 0 %
Lymphocytes Relative: 38 %
Lymphs Abs: 3.5 10*3/uL (ref 0.7–4.0)
MCH: 30.5 pg (ref 26.0–34.0)
MCHC: 32.2 g/dL (ref 30.0–36.0)
MCV: 95 fL (ref 80.0–100.0)
Monocytes Absolute: 0.8 10*3/uL (ref 0.1–1.0)
Monocytes Relative: 9 %
Neutro Abs: 4.5 10*3/uL (ref 1.7–7.7)
Neutrophils Relative %: 50 %
Platelet Count: 185 10*3/uL (ref 150–400)
RBC: 2.98 MIL/uL — ABNORMAL LOW (ref 4.22–5.81)
RDW: 12.7 % (ref 11.5–15.5)
WBC Count: 9.1 10*3/uL (ref 4.0–10.5)
nRBC: 0 % (ref 0.0–0.2)

## 2018-09-09 LAB — LACTATE DEHYDROGENASE: LDH: 191 U/L (ref 98–192)

## 2018-09-09 NOTE — Progress Notes (Signed)
[facility] [phone]   [phone]  OFFICE PROGRESS NOTE  [name], MD [address] [place] [address]  DIAGNOSIS: Chronic lymphocytic leukemia diagnosed in July 2015  PRIOR THERAPY: None  CURRENT THERAPY: Observation.  INTERVAL HISTORY: Ray Merritt 83 y.o. male returns to the clinic today for follow-up visit.  The patient is feeling fine today with no concerning complaints except for elevated blood pressure and he is working with his cardiologist for management of this condition.  He denied having any significant weight loss or night sweats.  He has no palpable lymphadenopathy he has no bleeding issues.  He has no chest pain, shortness of breath, cough or hemoptysis.  He has no fever or chills.  The patient is here today for evaluation and repeat blood work.  MEDICAL HISTORY: Past Medical History:  Diagnosis Date  . Anemia   . Arthritis   . Cancer ([facility]) 2015-present   CLL  . Chronic kidney disease   . Coronary artery disease 04/2016   1 stent  . Diabetes mellitus    insulin dependent   . Enlarged prostate   . Headache   . Hypertension   . Pneumonia     ALLERGIES:  has No Known Allergies.  MEDICATIONS:  Current Outpatient Medications  Medication Sig Dispense Refill  . ALPHAGAN P 0.1 % SOLN Place 2 drops 2 (two) times daily into both eyes.   0  . Ascorbic Acid (VITAMIN C) 1000 MG tablet Take 1,000 mg by mouth daily.    Marland Kitchen aspirin EC 81 MG tablet Take 81 mg by mouth at bedtime.     Marland Kitchen atorvastatin (LIPITOR) 20 MG tablet Take 20 mg by mouth daily.  0  . carvedilol (COREG) 25 MG tablet Take 25 mg by mouth 2 (two) times daily.   0  . cholecalciferol (VITAMIN D) 1000 units tablet Take 1,000 Units by mouth daily.     . cloNIDine (CATAPRES) 0.1 MG tablet Take 0.1 mg by mouth 2 (two) times daily.  0  . CONTOUR NEXT TEST test strip USE AS DIRECTED TO CHECK BLOOD SUGAR 2 TIMES PER DAY 100 each 2  .  diphenhydramine-acetaminophen (TYLENOL PM) 25-500 MG TABS tablet Take 2 tablets by mouth at bedtime.    . ferrous sulfate 325 (65 FE) MG tablet Take 325 mg by mouth 2 (two) times daily.     . finasteride (PROSCAR) 5 MG tablet Take 5 mg by mouth every evening.     . furosemide (LASIX) 80 MG tablet Take 40 mg by mouth daily.   1  . hydrALAZINE (APRESOLINE) 50 MG tablet Take 50 mg by mouth 3 (three) times daily.  0  . insulin NPH Human (HUMULIN N,NOVOLIN N) 100 UNIT/ML injection Inject 7-26 Units into the skin See admin instructions. Pt uses 10 units in the morning and 26 units in the evening.     . insulin regular (NOVOLIN R,HUMULIN R) 100 units/mL injection Inject 0.06-0.07 mLs (6-7 Units total) into the skin See admin instructions. 5 units in the morning and 7 units at dinner. (Patient taking differently: Inject 4-7 Units into the skin See admin instructions. 5 units in the morning and 7 units at dinner.) 10 mL 3  . isosorbide mononitrate (IMDUR) 120 MG 24 hr tablet Take 120 mg by mouth daily.    . Multiple Vitamin (MULTIVITAMIN WITH MINERALS) TABS tablet Take 1 tablet by mouth daily.    . Omega-3  Fatty Acids (FISH OIL) 1000 MG CAPS Take 1,000 mg by mouth 2 (two) times daily.    . tamsulosin (FLOMAX) 0.4 MG CAPS capsule Take 0.4 mg by mouth daily after supper.     . Travoprost, BAK Free, (TRAVATAN) 0.004 % SOLN ophthalmic solution Place 1 drop into both eyes at bedtime.    . triamcinolone cream (KENALOG) 0.1 % Apply 1 application topically 2 (two) times daily as needed (for itching/irritation).   0   No current facility-administered medications for this visit.     SURGICAL HISTORY:  Past Surgical History:  Procedure Laterality Date  . AV FISTULA PLACEMENT Left 04/28/2017   Procedure: Left arm ARTERIOVENOUS (AV) FISTULA CREATION;  Surgeon: Angelia Mould, MD;  Location: [facility];  Service: Vascular;  Laterality: Left;  . CARDIAC CATHETERIZATION N/A 05/01/2015   Procedure: Left Heart Cath  and Coronary Angiography;  Surgeon: Adrian Prows, MD;  Location: [facility];  Service: Cardiovascular;  Laterality: N/A;  . CARDIAC CATHETERIZATION  05/01/2015   Procedure: Intravascular Pressure Wire/FFR Study;  Surgeon: Adrian Prows, MD;  Location: [facility];  Service: Cardiovascular;;  . CARDIAC CATHETERIZATION N/A 05/22/2015   Procedure: Left Heart Cath and Coronary Angiography;  Surgeon: Adrian Prows, MD;  Location: [facility];  Service: Cardiovascular;  Laterality: N/A;  . CARDIAC CATHETERIZATION N/A 05/22/2015   Procedure: Coronary Stent Intervention;  Surgeon: Adrian Prows, MD;  Location: [facility];  Service: Cardiovascular;  Laterality: N/A;  . COLONOSCOPY W/ BIOPSIES AND POLYPECTOMY    . SHOULDER SURGERY    . TONSILLECTOMY      REVIEW OF SYSTEMS:  A comprehensive review of systems was negative.   PHYSICAL EXAMINATION: General appearance: alert, cooperative and no distress Head: Normocephalic, without obvious abnormality, atraumatic Neck: no adenopathy, no JVD, supple, symmetrical, trachea midline and thyroid not enlarged, symmetric, no tenderness/mass/nodules Lymph nodes: Cervical, supraclavicular, and axillary nodes normal. Resp: clear to auscultation bilaterally Back: symmetric, no curvature. ROM normal. No CVA tenderness. Cardio: regular rate and rhythm, S1, S2 normal, no murmur, click, rub or gallop GI: soft, non-tender; bowel sounds normal; no masses,  no organomegaly Extremities: extremities normal, atraumatic, no cyanosis or edema  ECOG PERFORMANCE STATUS: 1 - Symptomatic but completely ambulatory  Blood pressure (!) 167/56, pulse 64, temperature 97.7 F (36.5 C), temperature source Oral, resp. rate 19, height 6\' 3"  (1.905 m), weight 233 lb 6.4 oz (105.9 kg), SpO2 100 %.  LABORATORY DATA: Lab Results  Component Value Date   WBC 9.1 09/09/2018   HGB 9.1 (L) 09/09/2018   HCT 28.3 (L) 09/09/2018   MCV 95.0 09/09/2018   PLT 185 09/09/2018       Chemistry      Component Value Date/Time   NA 139 09/10/2017 0948   NA 137 03/10/2017 0848   K 4.3 09/10/2017 0948   K 4.3 03/10/2017 0848   CL 110 (H) 09/10/2017 0948   CO2 17 (L) 09/10/2017 0948   CO2 23 03/10/2017 0848   BUN 75 (H) 09/10/2017 0948   BUN 79.1 (H) 03/10/2017 0848   CREATININE 3.17 (HH) 09/10/2017 0948   CREATININE 3.4 (HH) 03/10/2017 0848      Component Value Date/Time   CALCIUM 9.2 09/10/2017 0948   CALCIUM 9.6 03/10/2017 0848   ALKPHOS 65 09/10/2017 0948   ALKPHOS 64 03/10/2017 0848   AST 11 09/10/2017 0948   AST 13 03/10/2017 0848   ALT 15 09/10/2017 0948   ALT 16 03/10/2017 0848  BILITOT 0.3 09/10/2017 0948   BILITOT 0.38 03/10/2017 0848       RADIOGRAPHIC STUDIES: No results found.  ASSESSMENT AND PLAN:  This is a very pleasant 83 years old African-American male with history of chronic lymphocytic leukemia. The patient is currently on observation and he is feeling fine. CBC today showed normal total white blood count at the patient continues to have mild anemia of chronic disease.  He is followed by Dr. Florene Glen for the anemia of chronic disease as well as renal insufficiency and hypertension. I recommended for the patient to continue on observation with repeat blood work in 6 months. He was advised to call immediately if he has any concerning symptoms in the interval. The patient voices understanding of current disease status and treatment options and is in agreement with the current care plan. All questions were answered. The patient knows to call the clinic with any problems, questions or concerns. We can certainly see the patient much sooner if necessary. I spent 10 minutes counseling the patient face to face. The total time spent in the appointment was 15 minutes. Disclaimer: This note was dictated with voice recognition software. Similar sounding words can inadvertently be transcribed and may not be corrected upon review.

## 2018-09-09 NOTE — Telephone Encounter (Signed)
Scheduled appt per 2/20 los - gave pt avs and calender per los.

## 2018-09-21 DIAGNOSIS — I25118 Atherosclerotic heart disease of native coronary artery with other forms of angina pectoris: Secondary | ICD-10-CM | POA: Diagnosis not present

## 2018-09-21 DIAGNOSIS — N400 Enlarged prostate without lower urinary tract symptoms: Secondary | ICD-10-CM | POA: Diagnosis not present

## 2018-09-21 DIAGNOSIS — H409 Unspecified glaucoma: Secondary | ICD-10-CM | POA: Diagnosis not present

## 2018-09-21 DIAGNOSIS — I10 Essential (primary) hypertension: Secondary | ICD-10-CM | POA: Diagnosis not present

## 2018-09-21 DIAGNOSIS — E119 Type 2 diabetes mellitus without complications: Secondary | ICD-10-CM | POA: Diagnosis not present

## 2018-09-21 DIAGNOSIS — I5032 Chronic diastolic (congestive) heart failure: Secondary | ICD-10-CM | POA: Diagnosis not present

## 2018-09-21 DIAGNOSIS — N184 Chronic kidney disease, stage 4 (severe): Secondary | ICD-10-CM | POA: Diagnosis not present

## 2018-09-21 DIAGNOSIS — E785 Hyperlipidemia, unspecified: Secondary | ICD-10-CM | POA: Diagnosis not present

## 2018-09-21 DIAGNOSIS — E1122 Type 2 diabetes mellitus with diabetic chronic kidney disease: Secondary | ICD-10-CM | POA: Diagnosis not present

## 2018-09-21 DIAGNOSIS — I5022 Chronic systolic (congestive) heart failure: Secondary | ICD-10-CM | POA: Diagnosis not present

## 2018-09-21 DIAGNOSIS — I2510 Atherosclerotic heart disease of native coronary artery without angina pectoris: Secondary | ICD-10-CM | POA: Diagnosis not present

## 2018-09-21 DIAGNOSIS — D638 Anemia in other chronic diseases classified elsewhere: Secondary | ICD-10-CM | POA: Diagnosis not present

## 2018-10-18 ENCOUNTER — Other Ambulatory Visit: Payer: Medicare Other

## 2018-10-20 ENCOUNTER — Other Ambulatory Visit: Payer: Self-pay

## 2018-10-21 ENCOUNTER — Ambulatory Visit (INDEPENDENT_AMBULATORY_CARE_PROVIDER_SITE_OTHER): Payer: Medicare Other | Admitting: Endocrinology

## 2018-10-21 ENCOUNTER — Encounter: Payer: Self-pay | Admitting: Endocrinology

## 2018-10-21 DIAGNOSIS — E1165 Type 2 diabetes mellitus with hyperglycemia: Secondary | ICD-10-CM | POA: Diagnosis not present

## 2018-10-21 DIAGNOSIS — Z794 Long term (current) use of insulin: Secondary | ICD-10-CM

## 2018-10-21 NOTE — Progress Notes (Signed)
Patient ID: Ray Merritt, male   DOB: [birth year], 83 y.o.   MRN: [id number]           Reason for Appointment: Follow-up for Type 2 Diabetes   Today's office visit was provided via telemedicine via telephone conversation . Consent for the patient has been obtained . Location of the patient: Home . Location of the provider: Office Only the patient and myself were participating in the encounter    History of Present Illness:          Date of diagnosis of type 2 diabetes mellitus : 1982        Background history:   He has been on insulin since the time of diagnosis, initially his blood sugars were significantly high ; he was symptomatic with dry mouth and blurred vision. He thinks he was probably taking NPH and Regular Insulin for quite some time and before his consultation was only on NPH 3 years ago he was put on Bydureon weekly in addition to NPH.  He thinks that it initially curb his appetite but he does not think he lost any significant amount of weight.  Also he does not think his blood sugar control was much better Tanzeum was stopped because of lack of benefit  A1c has been as high as 11.8 in 2/16  Recent history:   INSULIN regimen is : Novolin NPH 0 units acb and 22 units bedtime, Regular Insulin 4-5 units at breakfast and 7 supper     A1c is last at 6.1, no recent lab values available  Current blood sugar patterns and problems identified:  On his last visit he was told to stop his morning NPH because of tendency to low sugars midday and he has done so  He has not been able to come into the office for A1c  Also has been less active because of being homebound  Compared to the previous visit his blood sugars are relatively higher especially fasting  However these are not consistent  Minimal hypoglycemia with lowest blood sugar only once at 62 before dinner  He has occasional high readings after dinner or lunch if he is eating out or getting more meat  Usually in  the morning he will have an egg for protein either with his oatmeal or later  Other hypoglycemic drugs the patient is taking are:   none Side effects from medications have been: None  Compliance with the medical regimen: Good Hypoglycemia: with activity symptoms are weakness and feeling sweaty, treated with juice or other sweet drinks, usually can recognize blood sugars in the low 60s   Glucose monitoring:  done up to 3 times a day         Glucometer:  Contour       Blood Glucose readings by  review of his monitor download:   PRE-MEAL Fasting Lunch Dinner Bedtime Overall  Glucose range:  109-148  72-138  62-201  194   Mean/median:      124   POST-MEAL PC Breakfast PC Lunch PC Dinner  Glucose range:  155    Mean/median:      Previous readings  PRE-MEAL Fasting Lunch Dinner Bedtime Overall  Glucose range:  63-125   83-233    Mean/median:  88   125   117+/-40   POST-MEAL PC Breakfast PC Lunch PC Dinner  Glucose range:   83-191  109-202  Mean/median:   138  168     Self-care: The diet that the patient has  been following is: tries to limit portions .     Typical meal intake: Breakfast is old-fashioned oatmeal at 10 am, lunch is usually a sandwich.  He has mostly popcorn for snacks   Dinner time is usually 6-7              Dietician visit, most recent: none  CDE consultation: 7/16               Exercise: Some at shopping areas  Weight history:  Wt Readings from Last 3 Encounters:  09/09/18 233 lb 6.4 oz (105.9 kg)  07/20/18 239 lb 6.4 oz (108.6 kg)  05/12/18 236 lb (107 kg)    Glycemic control:   Lab Results  Component Value Date   HGBA1C 6.1 07/16/2018   HGBA1C 6.6 (H) 03/15/2018   HGBA1C 6.6 (H) 12/11/2017   Lab Results  Component Value Date   MICROALBUR 48.1 (H) 12/11/2017   LDLCALC 62 03/15/2018   CREATININE 3.17 (HH) 09/10/2017      Allergies as of 10/21/2018   No Known Allergies     Medication List       Accurate as of October 21, 2018  2:22 PM.  Always use your most recent med list.        Alphagan P 0.1 % Soln Generic drug:  brimonidine Place 2 drops 2 (two) times daily into both eyes.   aspirin EC 81 MG tablet Take 81 mg by mouth at bedtime.   atorvastatin 20 MG tablet Commonly known as:  LIPITOR Take 20 mg by mouth daily.   carvedilol 25 MG tablet Commonly known as:  COREG Take 25 mg by mouth 2 (two) times daily.   cholecalciferol 1000 units tablet Commonly known as:  VITAMIN D Take 1,000 Units by mouth daily.   cloNIDine 0.1 MG tablet Commonly known as:  CATAPRES Take 0.1 mg by mouth 2 (two) times daily.   Contour Next Test test strip Generic drug:  glucose blood USE AS DIRECTED TO CHECK BLOOD SUGAR 2 TIMES PER DAY   diphenhydramine-acetaminophen 25-500 MG Tabs tablet Commonly known as:  TYLENOL PM Take 2 tablets by mouth at bedtime.   ferrous sulfate 325 (65 FE) MG tablet Take 325 mg by mouth 2 (two) times daily.   finasteride 5 MG tablet Commonly known as:  PROSCAR Take 5 mg by mouth every evening.   Fish Oil 1000 MG Caps Take 1,000 mg by mouth 2 (two) times daily.   furosemide 80 MG tablet Commonly known as:  LASIX Take 40 mg by mouth daily.   hydrALAZINE 50 MG tablet Commonly known as:  APRESOLINE Take 50 mg by mouth 3 (three) times daily.   insulin NPH Human 100 UNIT/ML injection Commonly known as:  HUMULIN N,NOVOLIN N Inject 22 Units into the skin at bedtime. Inject 22 units under the skin every night.   insulin regular 100 units/mL injection Commonly known as:  NOVOLIN R,HUMULIN R Inject 0.06-0.07 mLs (6-7 Units total) into the skin See admin instructions. 5 units in the morning and 7 units at dinner.   isosorbide mononitrate 120 MG 24 hr tablet Commonly known as:  IMDUR Take 120 mg by mouth daily.   multivitamin with minerals Tabs tablet Take 1 tablet by mouth daily.   tamsulosin 0.4 MG Caps capsule Commonly known as:  FLOMAX Take 0.4 mg by mouth daily after supper.    Travoprost (BAK Free) 0.004 % Soln ophthalmic solution Commonly known as:  TRAVATAN Place 1 drop into both  eyes at bedtime.   triamcinolone cream 0.1 % Commonly known as:  KENALOG Apply 1 application topically 2 (two) times daily as needed (for itching/irritation).   vitamin C 1000 MG tablet Take 1,000 mg by mouth daily.       Allergies: No Known Allergies  Past Medical History:  Diagnosis Date  . Anemia   . Arthritis   . Cancer ([facility]) 2015-present   CLL  . Chronic kidney disease   . Coronary artery disease 04/2016   1 stent  . Diabetes mellitus    insulin dependent   . Enlarged prostate   . Headache   . Hypertension   . Pneumonia     Past Surgical History:  Procedure Laterality Date  . AV FISTULA PLACEMENT Left 04/28/2017   Procedure: Left arm ARTERIOVENOUS (AV) FISTULA CREATION;  Surgeon: Angelia Mould, MD;  Location: [facility];  Service: Vascular;  Laterality: Left;  . CARDIAC CATHETERIZATION N/A 05/01/2015   Procedure: Left Heart Cath and Coronary Angiography;  Surgeon: Adrian Prows, MD;  Location: [facility];  Service: Cardiovascular;  Laterality: N/A;  . CARDIAC CATHETERIZATION  05/01/2015   Procedure: Intravascular Pressure Wire/FFR Study;  Surgeon: Adrian Prows, MD;  Location: [facility];  Service: Cardiovascular;;  . CARDIAC CATHETERIZATION N/A 05/22/2015   Procedure: Left Heart Cath and Coronary Angiography;  Surgeon: Adrian Prows, MD;  Location: [facility];  Service: Cardiovascular;  Laterality: N/A;  . CARDIAC CATHETERIZATION N/A 05/22/2015   Procedure: Coronary Stent Intervention;  Surgeon: Adrian Prows, MD;  Location: [facility];  Service: Cardiovascular;  Laterality: N/A;  . COLONOSCOPY W/ BIOPSIES AND POLYPECTOMY    . SHOULDER SURGERY    . TONSILLECTOMY      Family History  Problem Relation Age of Onset  . Hypertension Mother   . Heart disease Sister   . Stroke Sister   . Diabetes Brother     Social History:  reports that  he has quit smoking. He has never used smokeless tobacco. He reports that he does not drink alcohol or use drugs.    Review of Systems   THYROID nodule:    He has a dominant left-sided thyroid nodule and did have a biopsy done Results as follows: BENIGN FOLLICULAR CELLS, COLLOID AND MACROPHAGES, CONSISTENT WITH  BENIGN GOITROUS NODULE  Last TSH was normal and his last exam showed a 3 cm nodule clinically in 1/19     Lipid history: Has been treated with Lipitor, followed by PCP and cardiologist  LDL consistently below 70  With increasing his Lovaza from 1 up to 4 capsules daily his triglycerides are controlled  Lab Results  Component Value Date   CHOL 115 03/15/2018   CHOL 117 08/17/2017   CHOL 129 05/12/2017   Lab Results  Component Value Date   HDL 33.20 (L) 03/15/2018   HDL 32.00 (L) 08/17/2017   HDL 27.30 (L) 05/12/2017   Lab Results  Component Value Date   LDLCALC 62 03/15/2018   LDLCALC 65 08/17/2017   LDLCALC 83 05/12/2016   Lab Results  Component Value Date   TRIG 97.0 03/15/2018   TRIG 102.0 08/17/2017   TRIG 300.0 (H) 05/12/2017   Lab Results  Component Value Date   CHOLHDL 3 03/15/2018   CHOLHDL 4 08/17/2017   CHOLHDL 5 05/12/2017   Lab Results  Component Value Date   LDLDIRECT 60.0 05/12/2017     HYPERTENSION: Followed by cardiologist  Eyes: .  Most recent eye exam was in 10/2017 and he is going regularly  RENAL dysfunction: He is followed by nephrologist,   Last creatinine was 3.5 done in 11/19  Lab Results  Component Value Date   CREATININE 3.17 ([facility]) 09/10/2017   CREATININE 3.27 (H) 08/17/2017   CREATININE 3.85 (H) 06/07/2017     Physical Examination:  There were no vitals taken for this visit.       ASSESSMENT:  Diabetes type 2 on insulin  See history of present illness for detailed discussion of his current management, blood sugar patterns and problems identified  He is on insulin with NPH and regular insulin  with syringes  A1c has not been done recently  Considering his age his blood sugar control at home is excellent with recent average 124 for the last 30 days at home However checking blood sugars mostly before meals May be sensitive to insulin because of his renal failure and not taking NPH in the morning recently without any rise in blood sugars during the day As usual has some variability in his blood sugars to be expected from his insulin type Also has not been active at all  He may occasionally have high postprandial readings based on his diet especially with more meat or higher fat meals Weight is about the same  PLAN:  No change in insulin as yet He will try to come in for an A1c More blood sugar readings after meals Call if blood sugars are consistently high either fasting or after evening meal Resume walking when able to  CKD: Continue follow-up with nephrologist     There are no Patient Instructions on file for this visit.   Duration of telephone visit =7 minutes   Elayne Snare 10/21/2018, 2:22 PM   Note: This office note was prepared with Dragon voice recognition system technology. Any transcriptional errors that result from this process are unintentional.

## 2018-11-15 ENCOUNTER — Other Ambulatory Visit: Payer: Self-pay | Admitting: Cardiology

## 2018-11-15 DIAGNOSIS — I6523 Occlusion and stenosis of bilateral carotid arteries: Secondary | ICD-10-CM

## 2018-11-19 ENCOUNTER — Other Ambulatory Visit: Payer: Self-pay | Admitting: Endocrinology

## 2018-11-30 DIAGNOSIS — I10 Essential (primary) hypertension: Secondary | ICD-10-CM | POA: Diagnosis not present

## 2018-11-30 DIAGNOSIS — E1122 Type 2 diabetes mellitus with diabetic chronic kidney disease: Secondary | ICD-10-CM | POA: Diagnosis not present

## 2018-12-16 ENCOUNTER — Other Ambulatory Visit: Payer: Self-pay | Admitting: Cardiology

## 2018-12-16 NOTE — Telephone Encounter (Signed)
Please fioll

## 2018-12-29 ENCOUNTER — Other Ambulatory Visit: Payer: Self-pay | Admitting: Endocrinology

## 2019-01-15 ENCOUNTER — Other Ambulatory Visit: Payer: Self-pay | Admitting: Cardiology

## 2019-01-15 DIAGNOSIS — C9110 Chronic lymphocytic leukemia of B-cell type not having achieved remission: Secondary | ICD-10-CM

## 2019-01-15 DIAGNOSIS — N189 Chronic kidney disease, unspecified: Secondary | ICD-10-CM

## 2019-01-17 DIAGNOSIS — I5032 Chronic diastolic (congestive) heart failure: Secondary | ICD-10-CM | POA: Diagnosis not present

## 2019-01-17 DIAGNOSIS — I10 Essential (primary) hypertension: Secondary | ICD-10-CM | POA: Diagnosis not present

## 2019-01-17 DIAGNOSIS — I2510 Atherosclerotic heart disease of native coronary artery without angina pectoris: Secondary | ICD-10-CM | POA: Diagnosis not present

## 2019-01-17 DIAGNOSIS — E119 Type 2 diabetes mellitus without complications: Secondary | ICD-10-CM | POA: Diagnosis not present

## 2019-01-17 DIAGNOSIS — N184 Chronic kidney disease, stage 4 (severe): Secondary | ICD-10-CM | POA: Diagnosis not present

## 2019-01-17 DIAGNOSIS — I25118 Atherosclerotic heart disease of native coronary artery with other forms of angina pectoris: Secondary | ICD-10-CM | POA: Diagnosis not present

## 2019-01-17 DIAGNOSIS — E1122 Type 2 diabetes mellitus with diabetic chronic kidney disease: Secondary | ICD-10-CM | POA: Diagnosis not present

## 2019-01-17 DIAGNOSIS — E785 Hyperlipidemia, unspecified: Secondary | ICD-10-CM | POA: Diagnosis not present

## 2019-01-17 DIAGNOSIS — D638 Anemia in other chronic diseases classified elsewhere: Secondary | ICD-10-CM | POA: Diagnosis not present

## 2019-01-17 DIAGNOSIS — H409 Unspecified glaucoma: Secondary | ICD-10-CM | POA: Diagnosis not present

## 2019-01-17 DIAGNOSIS — N400 Enlarged prostate without lower urinary tract symptoms: Secondary | ICD-10-CM | POA: Diagnosis not present

## 2019-01-17 DIAGNOSIS — I5022 Chronic systolic (congestive) heart failure: Secondary | ICD-10-CM | POA: Diagnosis not present

## 2019-02-26 ENCOUNTER — Other Ambulatory Visit: Payer: Self-pay | Admitting: Endocrinology

## 2019-02-28 DIAGNOSIS — N184 Chronic kidney disease, stage 4 (severe): Secondary | ICD-10-CM | POA: Diagnosis not present

## 2019-03-09 DIAGNOSIS — N2581 Secondary hyperparathyroidism of renal origin: Secondary | ICD-10-CM | POA: Diagnosis not present

## 2019-03-09 DIAGNOSIS — D631 Anemia in chronic kidney disease: Secondary | ICD-10-CM | POA: Diagnosis not present

## 2019-03-09 DIAGNOSIS — N189 Chronic kidney disease, unspecified: Secondary | ICD-10-CM | POA: Diagnosis not present

## 2019-03-09 DIAGNOSIS — N185 Chronic kidney disease, stage 5: Secondary | ICD-10-CM | POA: Diagnosis not present

## 2019-03-09 DIAGNOSIS — I120 Hypertensive chronic kidney disease with stage 5 chronic kidney disease or end stage renal disease: Secondary | ICD-10-CM | POA: Diagnosis not present

## 2019-03-10 ENCOUNTER — Encounter: Payer: Self-pay | Admitting: Internal Medicine

## 2019-03-10 ENCOUNTER — Other Ambulatory Visit: Payer: Self-pay | Admitting: Internal Medicine

## 2019-03-10 ENCOUNTER — Inpatient Hospital Stay: Payer: Medicare Other

## 2019-03-10 ENCOUNTER — Inpatient Hospital Stay: Payer: Medicare Other | Attending: Internal Medicine | Admitting: Internal Medicine

## 2019-03-10 ENCOUNTER — Other Ambulatory Visit: Payer: Self-pay

## 2019-03-10 VITALS — BP 170/57 | HR 62 | Temp 98.5°F | Resp 20 | Ht 75.0 in | Wt 234.4 lb

## 2019-03-10 DIAGNOSIS — Z794 Long term (current) use of insulin: Secondary | ICD-10-CM | POA: Insufficient documentation

## 2019-03-10 DIAGNOSIS — Z7982 Long term (current) use of aspirin: Secondary | ICD-10-CM | POA: Insufficient documentation

## 2019-03-10 DIAGNOSIS — N400 Enlarged prostate without lower urinary tract symptoms: Secondary | ICD-10-CM | POA: Diagnosis not present

## 2019-03-10 DIAGNOSIS — C9110 Chronic lymphocytic leukemia of B-cell type not having achieved remission: Secondary | ICD-10-CM | POA: Insufficient documentation

## 2019-03-10 DIAGNOSIS — N189 Chronic kidney disease, unspecified: Secondary | ICD-10-CM | POA: Diagnosis not present

## 2019-03-10 DIAGNOSIS — I6523 Occlusion and stenosis of bilateral carotid arteries: Secondary | ICD-10-CM

## 2019-03-10 DIAGNOSIS — E119 Type 2 diabetes mellitus without complications: Secondary | ICD-10-CM | POA: Diagnosis not present

## 2019-03-10 DIAGNOSIS — Z79899 Other long term (current) drug therapy: Secondary | ICD-10-CM | POA: Diagnosis not present

## 2019-03-10 LAB — CBC WITH DIFFERENTIAL (CANCER CENTER ONLY)
Abs Immature Granulocytes: 0.01 10*3/uL (ref 0.00–0.07)
Basophils Absolute: 0 10*3/uL (ref 0.0–0.1)
Basophils Relative: 1 %
Eosinophils Absolute: 0.3 10*3/uL (ref 0.0–0.5)
Eosinophils Relative: 3 %
HCT: 26.2 % — ABNORMAL LOW (ref 39.0–52.0)
Hemoglobin: 8.4 g/dL — ABNORMAL LOW (ref 13.0–17.0)
Immature Granulocytes: 0 %
Lymphocytes Relative: 40 %
Lymphs Abs: 3.3 10*3/uL (ref 0.7–4.0)
MCH: 30 pg (ref 26.0–34.0)
MCHC: 32.1 g/dL (ref 30.0–36.0)
MCV: 93.6 fL (ref 80.0–100.0)
Monocytes Absolute: 0.7 10*3/uL (ref 0.1–1.0)
Monocytes Relative: 9 %
Neutro Abs: 4 10*3/uL (ref 1.7–7.7)
Neutrophils Relative %: 47 %
Platelet Count: 170 10*3/uL (ref 150–400)
RBC: 2.8 MIL/uL — ABNORMAL LOW (ref 4.22–5.81)
RDW: 13.1 % (ref 11.5–15.5)
WBC Count: 8.3 10*3/uL (ref 4.0–10.5)
nRBC: 0 % (ref 0.0–0.2)

## 2019-03-10 LAB — CMP (CANCER CENTER ONLY)
ALT: 15 U/L (ref 0–44)
AST: 13 U/L — ABNORMAL LOW (ref 15–41)
Albumin: 3.4 g/dL — ABNORMAL LOW (ref 3.5–5.0)
Alkaline Phosphatase: 64 U/L (ref 38–126)
Anion gap: 10 (ref 5–15)
BUN: 85 mg/dL — ABNORMAL HIGH (ref 8–23)
CO2: 16 mmol/L — ABNORMAL LOW (ref 22–32)
Calcium: 8.9 mg/dL (ref 8.9–10.3)
Chloride: 108 mmol/L (ref 98–111)
Creatinine: 4.35 mg/dL (ref 0.61–1.24)
GFR, Est AFR Am: 14 mL/min — ABNORMAL LOW (ref >60)
GFR, Estimated: 12 mL/min — ABNORMAL LOW (ref >60)
Glucose, Bld: 137 mg/dL — ABNORMAL HIGH (ref 70–99)
Potassium: 4.8 mmol/L (ref 3.5–5.1)
Sodium: 134 mmol/L — ABNORMAL LOW (ref 135–145)
Total Bilirubin: 0.3 mg/dL (ref 0.3–1.2)
Total Protein: 6 g/dL — ABNORMAL LOW (ref 6.5–8.1)

## 2019-03-10 LAB — LACTATE DEHYDROGENASE: LDH: 188 U/L (ref 98–192)

## 2019-03-10 NOTE — Progress Notes (Signed)
[facility] [phone]   [phone]  OFFICE PROGRESS NOTE  [name], MD [address] [place] [address]  DIAGNOSIS: Chronic lymphocytic leukemia diagnosed in July 2015  PRIOR THERAPY: None  CURRENT THERAPY: Observation.  INTERVAL HISTORY: Ray Merritt 83 y.o. male returns to the clinic today for follow-up visit.  The patient is feeling fine today with no concerning complaints except for the fatigue.  He is followed by Dr. Posey Pronto at [facility] surgery and he scheduled to receive iron infusion soon.  His blood pressure is also elevated today.  He denied having any current chest pain, shortness of breath, cough or hemoptysis.  He denied having any fever or chills.  He has no nausea, vomiting, diarrhea or constipation.  He is here today for evaluation and repeat CBC.   MEDICAL HISTORY: Past Medical History:  Diagnosis Date   Anemia    Arthritis    Cancer ([place]) 2015-present   CLL   Chronic kidney disease    Coronary artery disease 04/2016   1 stent   Diabetes mellitus    insulin dependent    Enlarged prostate    Headache    Hypertension    Pneumonia     ALLERGIES:  has No Known Allergies.  MEDICATIONS:  Current Outpatient Medications  Medication Sig Dispense Refill   ALPHAGAN P 0.1 % SOLN Place 2 drops 2 (two) times daily into both eyes.   0   Ascorbic Acid (VITAMIN C) 1000 MG tablet Take 1,000 mg by mouth daily.     aspirin EC 81 MG tablet Take 81 mg by mouth at bedtime.      atorvastatin (LIPITOR) 20 MG tablet TAKE 1 TABLET BY MOUTH DAILY 90 tablet 0   carvedilol (COREG) 25 MG tablet Take 25 mg by mouth 2 (two) times daily.   0   cholecalciferol (VITAMIN D) 1000 units tablet Take 1,000 Units by mouth daily.      cloNIDine (CATAPRES) 0.1 MG tablet Take 0.1 mg by mouth 2 (two) times daily.  0   CONTOUR NEXT TEST test strip USE AS DIRECTED TO CHECK BLOOD SUGAR TWICE A DAY 100  strip 2   diphenhydramine-acetaminophen (TYLENOL PM) 25-500 MG TABS tablet Take 2 tablets by mouth at bedtime.     ferrous sulfate 325 (65 FE) MG tablet Take 325 mg by mouth 2 (two) times daily.      finasteride (PROSCAR) 5 MG tablet Take 5 mg by mouth every evening.      furosemide (LASIX) 80 MG tablet Take 40 mg by mouth daily.   1   hydrALAZINE (APRESOLINE) 50 MG tablet Take 100 mg by mouth 3 (three) times daily.   0   insulin NPH Human (HUMULIN N,NOVOLIN N) 100 UNIT/ML injection Inject 22 Units into the skin at bedtime. Inject 22 units under the skin every night.     insulin regular (NOVOLIN R) 100 units/mL injection Inject 0.04-0.07 mLs (4-7 Units total) into the skin See admin instructions. 5 units in the morning and 7 units at dinner. 10 mL 3   isosorbide mononitrate (IMDUR) 120 MG 24 hr tablet TAKE 1 TABLET BY MOUTH DAILY 30 tablet 4   Multiple Vitamin (MULTIVITAMIN WITH MINERALS) TABS tablet Take 1 tablet by mouth daily.     Omega-3 Fatty Acids (FISH OIL) 1000 MG CAPS Take 1,000 mg by mouth 2 (two) times daily.     tamsulosin (FLOMAX) 0.4 MG CAPS  capsule Take 0.4 mg by mouth daily after supper.      Travoprost, BAK Free, (TRAVATAN) 0.004 % SOLN ophthalmic solution Place 1 drop into both eyes at bedtime.     triamcinolone cream (KENALOG) 0.1 % Apply 1 application topically 2 (two) times daily as needed (for itching/irritation).   0   No current facility-administered medications for this visit.     SURGICAL HISTORY:  Past Surgical History:  Procedure Laterality Date   AV FISTULA PLACEMENT Left 04/28/2017   Procedure: Left arm ARTERIOVENOUS (AV) FISTULA CREATION;  Surgeon: Angelia Mould, MD;  Location: [facility];  Service: Vascular;  Laterality: Left;   CARDIAC CATHETERIZATION N/A 05/01/2015   Procedure: Left Heart Cath and Coronary Angiography;  Surgeon: Adrian Prows, MD;  Location: [facility];  Service: Cardiovascular;  Laterality: N/A;   CARDIAC  CATHETERIZATION  05/01/2015   Procedure: Intravascular Pressure Wire/FFR Study;  Surgeon: Adrian Prows, MD;  Location: [facility];  Service: Cardiovascular;;   CARDIAC CATHETERIZATION N/A 05/22/2015   Procedure: Left Heart Cath and Coronary Angiography;  Surgeon: Adrian Prows, MD;  Location: [facility];  Service: Cardiovascular;  Laterality: N/A;   CARDIAC CATHETERIZATION N/A 05/22/2015   Procedure: Coronary Stent Intervention;  Surgeon: Adrian Prows, MD;  Location: [facility];  Service: Cardiovascular;  Laterality: N/A;   COLONOSCOPY W/ BIOPSIES AND POLYPECTOMY     SHOULDER SURGERY     TONSILLECTOMY      REVIEW OF SYSTEMS:  A comprehensive review of systems was negative except for: Constitutional: positive for fatigue   PHYSICAL EXAMINATION: General appearance: alert, cooperative, fatigued and no distress Head: Normocephalic, without obvious abnormality, atraumatic Neck: no adenopathy, no JVD, supple, symmetrical, trachea midline and thyroid not enlarged, symmetric, no tenderness/mass/nodules Lymph nodes: Cervical, supraclavicular, and axillary nodes normal. Resp: clear to auscultation bilaterally Back: symmetric, no curvature. ROM normal. No CVA tenderness. Cardio: regular rate and rhythm, S1, S2 normal, no murmur, click, rub or gallop GI: soft, non-tender; bowel sounds normal; no masses,  no organomegaly Extremities: extremities normal, atraumatic, no cyanosis or edema  ECOG PERFORMANCE STATUS: 1 - Symptomatic but completely ambulatory  Blood pressure (!) 170/57, pulse 62, temperature 98.5 F (36.9 C), temperature source Oral, resp. rate 20, height 6\' 3"  (1.905 m), weight 234 lb 6.4 oz (106.3 kg), SpO2 100 %.  LABORATORY DATA: Lab Results  Component Value Date   WBC 8.3 03/10/2019   HGB 8.4 (L) 03/10/2019   HCT 26.2 (L) 03/10/2019   MCV 93.6 03/10/2019   PLT 170 03/10/2019      Chemistry      Component Value Date/Time   NA 139 09/10/2017 0948   NA 137  03/10/2017 0848   K 4.3 09/10/2017 0948   K 4.3 03/10/2017 0848   CL 110 (H) 09/10/2017 0948   CO2 17 (L) 09/10/2017 0948   CO2 23 03/10/2017 0848   BUN 75 (H) 09/10/2017 0948   BUN 79.1 (H) 03/10/2017 0848   CREATININE 3.17 (HH) 09/10/2017 0948   CREATININE 3.4 (HH) 03/10/2017 0848      Component Value Date/Time   CALCIUM 9.2 09/10/2017 0948   CALCIUM 9.6 03/10/2017 0848   ALKPHOS 65 09/10/2017 0948   ALKPHOS 64 03/10/2017 0848   AST 11 09/10/2017 0948   AST 13 03/10/2017 0848   ALT 15 09/10/2017 0948   ALT 16 03/10/2017 0848   BILITOT 0.3 09/10/2017 0948   BILITOT 0.38 03/10/2017 0848  RADIOGRAPHIC STUDIES: No results found.  ASSESSMENT AND PLAN:  This is a very pleasant 83 years old African-American male with history of chronic lymphocytic leukemia. The patient is currently on observation and he is feeling fine. Repeat CBC today showed normal white blood count as well as normal lymphocyte count. He continues to have anemia of chronic disease as well as chronic renal insufficiency and he is followed by Dr. Posey Pronto from [place] [facility]. I also recommended for the patient to take his blood pressure medication as prescribed and to discuss with Dr. Posey Pronto adjusting his medication The patient voices understanding of current disease status and treatment options and is in agreement with the current care plan. All questions were answered. The patient knows to call the clinic with any problems, questions or concerns. We can certainly see the patient much sooner if necessary. I spent 10 minutes counseling the patient face to face. The total time spent in the appointment was 15 minutes. Disclaimer: This note was dictated with voice recognition software. Similar sounding words can inadvertently be transcribed and may not be corrected upon review.

## 2019-03-11 ENCOUNTER — Telehealth: Payer: Self-pay | Admitting: Internal Medicine

## 2019-03-11 NOTE — Telephone Encounter (Signed)
Scheduled appt per 8/20 los - mailed letter with appt date and time   

## 2019-03-18 ENCOUNTER — Encounter: Payer: Self-pay | Admitting: Cardiology

## 2019-03-18 ENCOUNTER — Other Ambulatory Visit: Payer: Self-pay

## 2019-03-18 ENCOUNTER — Ambulatory Visit (INDEPENDENT_AMBULATORY_CARE_PROVIDER_SITE_OTHER): Payer: Medicare Other | Admitting: Cardiology

## 2019-03-18 ENCOUNTER — Other Ambulatory Visit: Payer: Self-pay | Admitting: Cardiology

## 2019-03-18 VITALS — BP 177/61 | HR 68 | Ht 75.0 in | Wt 234.0 lb

## 2019-03-18 DIAGNOSIS — H409 Unspecified glaucoma: Secondary | ICD-10-CM | POA: Diagnosis not present

## 2019-03-18 DIAGNOSIS — I129 Hypertensive chronic kidney disease with stage 1 through stage 4 chronic kidney disease, or unspecified chronic kidney disease: Secondary | ICD-10-CM

## 2019-03-18 DIAGNOSIS — E785 Hyperlipidemia, unspecified: Secondary | ICD-10-CM | POA: Diagnosis not present

## 2019-03-18 DIAGNOSIS — E1122 Type 2 diabetes mellitus with diabetic chronic kidney disease: Secondary | ICD-10-CM | POA: Diagnosis not present

## 2019-03-18 DIAGNOSIS — N184 Chronic kidney disease, stage 4 (severe): Secondary | ICD-10-CM | POA: Diagnosis not present

## 2019-03-18 DIAGNOSIS — Z794 Long term (current) use of insulin: Secondary | ICD-10-CM

## 2019-03-18 DIAGNOSIS — E119 Type 2 diabetes mellitus without complications: Secondary | ICD-10-CM | POA: Diagnosis not present

## 2019-03-18 DIAGNOSIS — E7800 Pure hypercholesterolemia, unspecified: Secondary | ICD-10-CM

## 2019-03-18 DIAGNOSIS — I5022 Chronic systolic (congestive) heart failure: Secondary | ICD-10-CM | POA: Diagnosis not present

## 2019-03-18 DIAGNOSIS — I25118 Atherosclerotic heart disease of native coronary artery with other forms of angina pectoris: Secondary | ICD-10-CM | POA: Diagnosis not present

## 2019-03-18 DIAGNOSIS — I25119 Atherosclerotic heart disease of native coronary artery with unspecified angina pectoris: Secondary | ICD-10-CM

## 2019-03-18 DIAGNOSIS — I10 Essential (primary) hypertension: Secondary | ICD-10-CM | POA: Diagnosis not present

## 2019-03-18 DIAGNOSIS — N400 Enlarged prostate without lower urinary tract symptoms: Secondary | ICD-10-CM | POA: Diagnosis not present

## 2019-03-18 DIAGNOSIS — I5032 Chronic diastolic (congestive) heart failure: Secondary | ICD-10-CM | POA: Diagnosis not present

## 2019-03-18 DIAGNOSIS — D638 Anemia in other chronic diseases classified elsewhere: Secondary | ICD-10-CM | POA: Diagnosis not present

## 2019-03-18 DIAGNOSIS — I2510 Atherosclerotic heart disease of native coronary artery without angina pectoris: Secondary | ICD-10-CM | POA: Diagnosis not present

## 2019-03-18 MED ORDER — RANOLAZINE ER 500 MG PO TB12: 500.0000 mg | ORAL_TABLET | Freq: Two times a day (BID) | ORAL | 1 refills | Status: DC

## 2019-03-18 MED ORDER — NITROGLYCERIN 0.4 MG SL SUBL: 0.4000 mg | SUBLINGUAL_TABLET | SUBLINGUAL | 3 refills | Status: AC | PRN

## 2019-03-18 NOTE — Progress Notes (Signed)
Primary Physician:  [name], MD   Patient ID: Ray Merritt, male    DOB: Oct 21, 1935, 83 y.o.   MRN: [id number]  Subjective:    Chief Complaint  Patient presents with  . Chest Pain    HPI: Ray Merritt  is a 83 y.o. male  with hypertension, hyperlipidemia, uncontrolled type 2 diabetes, stage IV chronic kidney disease, CAD S/P angioplasty 05/22/2015 with 3.5 mm resolute alluding stent to the mid LAD, Moderate 50-60% stenosis and RCA.  Patient presents for follow up for chest tightness. Over the last 3-4 months, has noticed with over exertion, has chest heaviness and shortness of breath that improves with slowing his pace or stopping and resting. He has not used any nitroglycerin. He has not had any pain at rest. Has chronic shortness of breath and mild claudication symptoms that have remained stable with chronic mild to moderate dyspnea.  Tolerating all medications well.   He is followed by Dr. Florene Glen for CKD and has left forearm AV fistula placed in October 2018. States CKD has been stable, he has been found to have iron deficiency anemia. He reports that he is awaiting to hear when to start iron transfusions. Also followed by Dr. Dwyane Dee for diabetes management and over the past one year, A1c has consistently been <7%.  Past Medical History:  Diagnosis Date  . Anemia   . Arthritis   . Cancer ([place]) 2015-present   CLL  . Chronic kidney disease   . Coronary artery disease 04/2016   1 stent  . Diabetes mellitus    insulin dependent   . Enlarged prostate   . Headache   . Hypertension   . Pneumonia     Past Surgical History:  Procedure Laterality Date  . AV FISTULA PLACEMENT Left 04/28/2017   Procedure: Left arm ARTERIOVENOUS (AV) FISTULA CREATION;  Surgeon: Angelia Mould, MD;  Location: [facility];  Service: Vascular;  Laterality: Left;  . CARDIAC CATHETERIZATION N/A 05/01/2015   Procedure: Left Heart Cath and Coronary Angiography;  Surgeon: Adrian Prows, MD;   Location: [facility];  Service: Cardiovascular;  Laterality: N/A;  . CARDIAC CATHETERIZATION  05/01/2015   Procedure: Intravascular Pressure Wire/FFR Study;  Surgeon: Adrian Prows, MD;  Location: [facility];  Service: Cardiovascular;;  . CARDIAC CATHETERIZATION N/A 05/22/2015   Procedure: Left Heart Cath and Coronary Angiography;  Surgeon: Adrian Prows, MD;  Location: [facility];  Service: Cardiovascular;  Laterality: N/A;  . CARDIAC CATHETERIZATION N/A 05/22/2015   Procedure: Coronary Stent Intervention;  Surgeon: Adrian Prows, MD;  Location: [facility];  Service: Cardiovascular;  Laterality: N/A;  . COLONOSCOPY W/ BIOPSIES AND POLYPECTOMY    . SHOULDER SURGERY    . TONSILLECTOMY      Social History   Socioeconomic History  . Marital status: Married    Spouse name: Not on file  . Number of children: Not on file  . Years of education: Not on file  . Highest education level: Not on file  Occupational History  . Occupation: retired  Scientific laboratory technician  . Financial resource strain: Not on file  . Food insecurity    Worry: Not on file    Inability: Not on file  . Transportation needs    Medical: Not on file    Non-medical: Not on file  Tobacco Use  . Smoking status: Former Research scientist (life sciences)  . Smokeless tobacco: Never Used  . Tobacco comment: quit smoking in early 70's  Substance and Sexual Activity  . Alcohol use: No  . Drug use: No  . Sexual activity: Not on file  Lifestyle  . Physical activity    Days per week: Not on file    Minutes per session: Not on file  . Stress: Not on file  Relationships  . Social Herbalist on phone: Not on file    Gets together: Not on file    Attends religious service: Not on file    Active member of club or organization: Not on file    Attends meetings of clubs or organizations: Not on file    Relationship status: Not on file  . Intimate partner violence    Fear of current or ex partner: Not on file    Emotionally abused:  Not on file    Physically abused: Not on file    Forced sexual activity: Not on file  Other Topics Concern  . Not on file  Social History Narrative  . Not on file    Review of Systems  Constitution: Negative for decreased appetite, malaise/fatigue, weight gain and weight loss.  Eyes: Negative for visual disturbance.  Cardiovascular: Positive for chest pain, claudication and dyspnea on exertion. Negative for leg swelling, orthopnea, palpitations and syncope.  Respiratory: Negative for hemoptysis and wheezing.   Endocrine: Negative for cold intolerance and heat intolerance.  Hematologic/Lymphatic: Does not bruise/bleed easily.  Skin: Negative for nail changes.  Musculoskeletal: Negative for muscle weakness and myalgias.  Gastrointestinal: Negative for abdominal pain, change in bowel habit, nausea and vomiting.  Neurological: Negative for difficulty with concentration, dizziness, focal weakness and headaches.  Psychiatric/Behavioral: Negative for altered mental status and suicidal ideas.  All other systems reviewed and are negative.     Objective:  Blood pressure (!) 177/61, pulse 68, height 6\' 3"  (1.905 m), weight 234 lb (106.1 kg), SpO2 98 %. Body mass index is 29.25 kg/m.    Physical Exam  Constitutional: He is oriented to person, place, and time. Vital signs are normal. He appears well-developed and well-nourished.  Mildly obese  HENT:  Head: Normocephalic and atraumatic.  Neck: Normal range of motion.  Cardiovascular: Normal rate, regular rhythm, normal heart sounds and intact distal pulses.  Pulses:      Carotid pulses are on the right side with bruit.      Femoral pulses are 2+ on the right side with bruit and 2+ on the left side.      Popliteal pulses are 2+ on the right side and 2+ on the left side.       Dorsalis pedis pulses are 1+ on the right side and 1+ on the left side.       Posterior tibial pulses are 2+ on the right side and 2+ on the left side.  Bilateral 1+  pitting edema  Pulmonary/Chest: Effort normal and breath sounds normal. No accessory muscle usage. No respiratory distress.  Abdominal: Soft. Bowel sounds are normal.  Obese and pannus present  Musculoskeletal: Normal range of motion.  Neurological: He is alert and oriented to person, place, and time.  Skin: Skin is warm and dry.  Vitals reviewed.  Radiology: No results found.  Laboratory examination:    CMP Latest Ref Rng & Units 03/10/2019 07/16/2018 03/15/2018  Glucose 70 - 99 mg/dL 137(H) 76 141(H)  BUN 8 - 23 mg/dL 85(H) - -  Creatinine 0.61 - 1.24 mg/dL 4.35(HH) - -  Sodium 135 - 145 mmol/L 134(L) - -  Potassium  3.5 - 5.1 mmol/L 4.8 - -  Chloride 98 - 111 mmol/L 108 - -  CO2 22 - 32 mmol/L 16(L) - -  Calcium 8.9 - 10.3 mg/dL 8.9 - -  Total Protein 6.5 - 8.1 g/dL 6.0(L) - -  Total Bilirubin 0.3 - 1.2 mg/dL 0.3 - -  Alkaline Phos 38 - 126 U/L 64 - -  AST 15 - 41 U/L 13(L) - -  ALT 0 - 44 U/L 15 - -   CBC Latest Ref Rng & Units 03/10/2019 09/09/2018 03/10/2018  WBC 4.0 - 10.5 K/uL 8.3 9.1 9.4  Hemoglobin 13.0 - 17.0 g/dL 8.4(L) 9.1(L) 9.3(L)  Hematocrit 39.0 - 52.0 % 26.2(L) 28.3(L) 28.1(L)  Platelets 150 - 400 K/uL 170 185 200   Lipid Panel     Component Value Date/Time   CHOL 115 03/15/2018 0917   TRIG 97.0 03/15/2018 0917   HDL 33.20 (L) 03/15/2018 0917   CHOLHDL 3 03/15/2018 0917   VLDL 19.4 03/15/2018 0917   LDLCALC 62 03/15/2018 0917   LDLDIRECT 60.0 05/12/2017 0909   HEMOGLOBIN A1C Lab Results  Component Value Date   HGBA1C 6.1 07/16/2018   MPG 171 12/10/2015   TSH No results for input(s): TSH in the last 8760 hours.  PRN Meds:. There are no discontinued medications. Current Meds  Medication Sig  . ALPHAGAN P 0.1 % SOLN Place 2 drops 2 (two) times daily into both eyes.   . Ascorbic Acid (VITAMIN C) 1000 MG tablet Take 1,000 mg by mouth daily.  Marland Kitchen aspirin EC 81 MG tablet Take 81 mg by mouth at bedtime.   Marland Kitchen atorvastatin (LIPITOR) 20 MG tablet TAKE 1  TABLET BY MOUTH DAILY  . carvedilol (COREG) 25 MG tablet Take 25 mg by mouth 2 (two) times daily.   . cholecalciferol (VITAMIN D) 1000 units tablet Take 1,000 Units by mouth daily.   . cloNIDine (CATAPRES) 0.1 MG tablet Take 0.1 mg by mouth 2 (two) times daily.  . CONTOUR NEXT TEST test strip USE AS DIRECTED TO CHECK BLOOD SUGAR TWICE A DAY  . diphenhydramine-acetaminophen (TYLENOL PM) 25-500 MG TABS tablet Take 2 tablets by mouth at bedtime.  . ferrous sulfate 325 (65 FE) MG tablet Take 325 mg by mouth 2 (two) times daily.   . finasteride (PROSCAR) 5 MG tablet Take 5 mg by mouth every evening.   . furosemide (LASIX) 80 MG tablet Take 40 mg by mouth daily.   . hydrALAZINE (APRESOLINE) 50 MG tablet Take 100 mg by mouth 3 (three) times daily.   . insulin NPH Human (HUMULIN N,NOVOLIN N) 100 UNIT/ML injection Inject 22 Units into the skin at bedtime. Inject 22 units under the skin every night.  . insulin regular (NOVOLIN R) 100 units/mL injection Inject 0.04-0.07 mLs (4-7 Units total) into the skin See admin instructions. 5 units in the morning and 7 units at dinner.  . isosorbide mononitrate (IMDUR) 120 MG 24 hr tablet TAKE 1 TABLET BY MOUTH DAILY  . Multiple Vitamin (MULTIVITAMIN WITH MINERALS) TABS tablet Take 1 tablet by mouth daily.  . Omega-3 Fatty Acids (FISH OIL) 1000 MG CAPS Take 1,000 mg by mouth 2 (two) times daily.  . tamsulosin (FLOMAX) 0.4 MG CAPS capsule Take 0.4 mg by mouth daily after supper.   . Travoprost, BAK Free, (TRAVATAN) 0.004 % SOLN ophthalmic solution Place 1 drop into both eyes at bedtime.  . triamcinolone cream (KENALOG) 0.1 % Apply 1 application topically 2 (two) times daily as needed (for itching/irritation).  Cardiac Studies:   Echocardiogram 11/03/2017: Left ventricle cavity is normal in size. Moderate concentric hypertrophy of the left ventricle. Mild decrease in global wall motion. Visual EF is 45-50%. Doppler evidence of grade I (impaired) diastolic  dysfunction, normal LAP. Calculated EF 45%. Left atrial cavity is moderately dilated. 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 Mild (Grade I) mitral regurgitation. Mild tricuspid regurgitation. Mild PH.Estimated pulmonary artery systolic pressure 64-40 mmHg. IVC is dilated with blunted respiratory response. Mild PH new since echocardiogram in 2016.  Nuclear stress test [12/19/2016]: 1. The resting electrocardiogram demonstrated normal sinus rhythm, normal resting conduction, no resting arrhythmias and ST depression in V3 to V6. Non specific T change. Stress EKG is non-diagnostic for ischemia as it a pharmacologic stress using Lexiscan. Stress symptoms included dyspnea. 2. Left ventricular cavity is noted to be enlarged on the rest and stress studies. LV end diastolic volume of 347 mL. REST and STRESS images demonstrate decreased tracer uptake in the basal inferior, mid inferior and apical inferior segments of the left ventricle. These defects are related to diaphragmatic attenuation. There is no ischemia. The left ventricular ejection fraction was calculated to be 39% with global hypokinesis. This is an intermediate risk study, clinical correlation recommended.  Coronary Angiogram [05/22/2015]: PCI mid LAD with 3.5x26 mm Resolute DES. Periprocedural MI with septal branch occlusion.  Lower Extremity Dopplers [03/22/2015]: No hemodynamically significant stenoses are identified in the right lower extremity arterial system. Bilateral deep formal arteries show biphasic waveforms with < 50% stenosis. Moderate velocity increase at the left posterior tibial artery suggestive of >50% stenosis with diffuse disease in the AT. This exam reveals normal perfusion of both the lower extremities with ABI of 1.01.  Assessment:   Atherosclerosis of native coronary artery of native heart with angina pectoris ([facility]) - Plan: EKG 12-Lead, PCV ECHOCARDIOGRAM COMPLETE, PCV MYOCARDIAL PERFUSION WITH LEXISCAN  Chronic  diastolic heart failure ([facility]) - Plan: EKG 12-Lead  Essential hypertension - Plan: EKG 12-Lead  Hypercholesteremia - Plan: EKG 12-Lead  Controlled type 2 diabetes mellitus with stage 4 chronic kidney disease, with long-term current use of insulin ([place])  EKG 03/18/2019: Normal sinus rhythm at 66 bpm, normal axis, T wave abnormality in inferolateral leads, cannot exclude ischemia.   Recommendations:   Patient made an appointment to see [place] today for evaluation of exertional chest heaviness.  For the last few months if he pushes himself, he has chest discomfort and associated shortness of breath that improves with resting.  He has previously had intermediate risk nuclear stress test and mildly depressed LVEF on echocardiogram.  His symptoms are concerning for angina.  He does have CKD stage IV, currently not on dialysis.  Medical management has been recommended in view of his kidney disease in hopes of avoiding dialysis, but if unable to control his symptoms, will need to proceed with coronary angiogram.  I will repeat Lexiscan nuclear stress test for follow-up as well as his echocardiogram.  I will start him on Ranexa to hopefully help with angina symptoms.  His blood pressure is elevated today, hopefully this will improve with medication changes.  He has also been found to have worsening iron deficiency anemia recently that could also be contributing to his symptoms.  He is planning to start iron infusion therapy soon.  He will continue to follow-up with his nephrologist regarding this.  I have encouraged him to contact me sooner if his symptoms worsen.  I have given him a prescription for nitroglycerin for as needed use.  Advised him  to take it easy until he can have further evaluation.  No changes are noted to physical exam.  He does have slightly more prominent T wave flattening and inferior leads.  I will see him back after his test for follow-up.  Miquel Dunn, MSN, APRN, FNP-C [facility]  Cardiovascular. [place] Office: [phone] Fax: [phone]

## 2019-03-22 ENCOUNTER — Other Ambulatory Visit: Payer: Self-pay

## 2019-03-22 ENCOUNTER — Ambulatory Visit (INDEPENDENT_AMBULATORY_CARE_PROVIDER_SITE_OTHER): Payer: Medicare Other

## 2019-03-22 DIAGNOSIS — I25119 Atherosclerotic heart disease of native coronary artery with unspecified angina pectoris: Secondary | ICD-10-CM

## 2019-03-25 NOTE — Progress Notes (Signed)
Worsening heart function, will continue with further eval with stress testing and will notify patient results at that time.

## 2019-03-30 ENCOUNTER — Ambulatory Visit (INDEPENDENT_AMBULATORY_CARE_PROVIDER_SITE_OTHER): Payer: Medicare Other

## 2019-03-30 ENCOUNTER — Other Ambulatory Visit: Payer: Self-pay

## 2019-03-30 DIAGNOSIS — I25119 Atherosclerotic heart disease of native coronary artery with unspecified angina pectoris: Secondary | ICD-10-CM | POA: Diagnosis not present

## 2019-04-04 NOTE — Progress Notes (Signed)
Called pt to inform him about his stress test. Pt daughter mention he is feeling about the same way as when he came to the office.

## 2019-04-04 NOTE — Progress Notes (Signed)
Let patient know that stress test does reveal a very small area of ischemia (blockage) that was not previously noted on his stress test in 2018. Ask him if he is feeling better with the Ranexa?

## 2019-04-05 NOTE — Progress Notes (Signed)
Can increase to 1000 mg BID

## 2019-04-05 NOTE — Progress Notes (Signed)
Called pt to ask which dose was he taking. Pt mention he is take 500mg  BID

## 2019-04-06 ENCOUNTER — Other Ambulatory Visit: Payer: No Typology Code available for payment source

## 2019-04-06 DIAGNOSIS — H409 Unspecified glaucoma: Secondary | ICD-10-CM | POA: Diagnosis not present

## 2019-04-06 DIAGNOSIS — N400 Enlarged prostate without lower urinary tract symptoms: Secondary | ICD-10-CM | POA: Diagnosis not present

## 2019-04-06 DIAGNOSIS — E785 Hyperlipidemia, unspecified: Secondary | ICD-10-CM | POA: Diagnosis not present

## 2019-04-06 DIAGNOSIS — D638 Anemia in other chronic diseases classified elsewhere: Secondary | ICD-10-CM | POA: Diagnosis not present

## 2019-04-06 DIAGNOSIS — I2510 Atherosclerotic heart disease of native coronary artery without angina pectoris: Secondary | ICD-10-CM | POA: Diagnosis not present

## 2019-04-06 DIAGNOSIS — N184 Chronic kidney disease, stage 4 (severe): Secondary | ICD-10-CM | POA: Diagnosis not present

## 2019-04-06 DIAGNOSIS — E119 Type 2 diabetes mellitus without complications: Secondary | ICD-10-CM | POA: Diagnosis not present

## 2019-04-06 DIAGNOSIS — I10 Essential (primary) hypertension: Secondary | ICD-10-CM | POA: Diagnosis not present

## 2019-04-06 DIAGNOSIS — I5032 Chronic diastolic (congestive) heart failure: Secondary | ICD-10-CM | POA: Diagnosis not present

## 2019-04-06 DIAGNOSIS — E1122 Type 2 diabetes mellitus with diabetic chronic kidney disease: Secondary | ICD-10-CM | POA: Diagnosis not present

## 2019-04-06 DIAGNOSIS — I5022 Chronic systolic (congestive) heart failure: Secondary | ICD-10-CM | POA: Diagnosis not present

## 2019-04-06 DIAGNOSIS — I25118 Atherosclerotic heart disease of native coronary artery with other forms of angina pectoris: Secondary | ICD-10-CM | POA: Diagnosis not present

## 2019-04-06 NOTE — Progress Notes (Signed)
Called pt to inform him to start 1000mg  BID. Pt understood.

## 2019-04-15 ENCOUNTER — Ambulatory Visit: Payer: No Typology Code available for payment source | Admitting: Cardiology

## 2019-04-15 ENCOUNTER — Other Ambulatory Visit: Payer: Self-pay | Admitting: Cardiology

## 2019-04-15 DIAGNOSIS — C9110 Chronic lymphocytic leukemia of B-cell type not having achieved remission: Secondary | ICD-10-CM

## 2019-04-15 DIAGNOSIS — N189 Chronic kidney disease, unspecified: Secondary | ICD-10-CM

## 2019-04-18 ENCOUNTER — Other Ambulatory Visit: Payer: No Typology Code available for payment source

## 2019-04-25 ENCOUNTER — Ambulatory Visit (INDEPENDENT_AMBULATORY_CARE_PROVIDER_SITE_OTHER): Payer: Medicare Other | Admitting: Cardiology

## 2019-04-25 ENCOUNTER — Other Ambulatory Visit: Payer: Self-pay

## 2019-04-25 ENCOUNTER — Encounter: Payer: Self-pay | Admitting: Cardiology

## 2019-04-25 VITALS — BP 174/64 | HR 65 | Temp 97.3°F | Ht 75.0 in | Wt 226.7 lb

## 2019-04-25 DIAGNOSIS — I5042 Chronic combined systolic (congestive) and diastolic (congestive) heart failure: Secondary | ICD-10-CM | POA: Diagnosis not present

## 2019-04-25 DIAGNOSIS — I25119 Atherosclerotic heart disease of native coronary artery with unspecified angina pectoris: Secondary | ICD-10-CM | POA: Diagnosis not present

## 2019-04-25 DIAGNOSIS — N189 Chronic kidney disease, unspecified: Secondary | ICD-10-CM | POA: Diagnosis not present

## 2019-04-25 DIAGNOSIS — I10 Essential (primary) hypertension: Secondary | ICD-10-CM

## 2019-04-25 MED ORDER — AMLODIPINE BESYLATE 5 MG PO TABS: 5.0000 mg | ORAL_TABLET | Freq: Every day | ORAL | 2 refills | Status: DC

## 2019-04-25 NOTE — Progress Notes (Signed)
Primary Physician:  [name], MD   Patient ID: Ray Merritt, male    DOB: [birth year], 83 y.o.   MRN: [id number]  Subjective:    Chief Complaint  Patient presents with  . Chest Pain  . Results    lexi, echo  . Follow-up    HPI: Ray Merritt  is a 83 y.o. male  with hypertension, hyperlipidemia, uncontrolled type 2 diabetes, stage IV chronic kidney disease, CAD S/P angioplasty 05/22/2015 with 3.5 mm resolute alluding stent to the mid LAD, Moderate 50-60% stenosis and RCA.  Patient was recently seen for symptoms of angina.  He was started on Ranexa.  Underwent echocardiogram and Lexiscan nuclear stress test and now presents to discuss results.  Since being on 1000 mg of Ranexa, symptoms have improved. He does continue to have some dyspnea on exertion, but has not had any chest pain like before since being on Ranexa.  He is followed by Dr. Florene Glen for CKD and has left forearm AV fistula placed in October 2018. States CKD has been stable, he has been found to have iron deficiency anemia. He reports that he is awaiting to hear when to start iron transfusions. Also followed by Dr. Dwyane Dee for diabetes management and over the past one year, A1c has consistently been <7%.  Past Medical History:  Diagnosis Date  . Anemia   . Arthritis   . Cancer ([place]) 2015-present   CLL  . Chronic kidney disease   . Coronary artery disease 04/2016   1 stent  . Diabetes mellitus    insulin dependent   . Enlarged prostate   . Headache   . Hypertension   . Pneumonia     Past Surgical History:  Procedure Laterality Date  . AV FISTULA PLACEMENT Left 04/28/2017   Procedure: Left arm ARTERIOVENOUS (AV) FISTULA CREATION;  Surgeon: Angelia Mould, MD;  Location: [place];  Service: Vascular;  Laterality: Left;  . CARDIAC CATHETERIZATION N/A 05/01/2015   Procedure: Left Heart Cath and Coronary Angiography;  Surgeon: Adrian Prows, MD;  Location: [facility];  Service: Cardiovascular;   Laterality: N/A;  . CARDIAC CATHETERIZATION  05/01/2015   Procedure: Intravascular Pressure Wire/FFR Study;  Surgeon: Adrian Prows, MD;  Location: [place] [facility];  Service: Cardiovascular;;  . CARDIAC CATHETERIZATION N/A 05/22/2015   Procedure: Left Heart Cath and Coronary Angiography;  Surgeon: Adrian Prows, MD;  Location: [facility] [facility];  Service: Cardiovascular;  Laterality: N/A;  . CARDIAC CATHETERIZATION N/A 05/22/2015   Procedure: Coronary Stent Intervention;  Surgeon: Adrian Prows, MD;  Location: [facility];  Service: Cardiovascular;  Laterality: N/A;  . COLONOSCOPY W/ BIOPSIES AND POLYPECTOMY    . SHOULDER SURGERY    . TONSILLECTOMY      Social History   Socioeconomic History  . Marital status: Married    Spouse name: Not on file  . Number of children: 3  . Years of education: Not on file  . Highest education level: Not on file  Occupational History  . Occupation: retired  Scientific laboratory technician  . Financial resource strain: Not on file  . Food insecurity    Worry: Not on file    Inability: Not on file  . Transportation needs    Medical: Not on file    Non-medical: Not on file  Tobacco Use  . Smoking status: Former Smoker    Packs/day: 2.00    Types: Cigarettes  . Smokeless tobacco: Never Used  . Tobacco comment:  quit smoking in early 70's, started ate age 58  Substance and Sexual Activity  . Alcohol use: No  . Drug use: No  . Sexual activity: Not on file  Lifestyle  . Physical activity    Days per week: Not on file    Minutes per session: Not on file  . Stress: Not on file  Relationships  . Social Herbalist on phone: Not on file    Gets together: Not on file    Attends religious service: Not on file    Active member of club or organization: Not on file    Attends meetings of clubs or organizations: Not on file    Relationship status: Not on file  . Intimate partner violence    Fear of current or ex partner: Not on file    Emotionally abused: Not  on file    Physically abused: Not on file    Forced sexual activity: Not on file  Other Topics Concern  . Not on file  Social History Narrative  . Not on file    Review of Systems  Constitution: Negative for decreased appetite, malaise/fatigue, weight gain and weight loss.  Eyes: Negative for visual disturbance.  Cardiovascular: Positive for claudication and dyspnea on exertion. Negative for chest pain, leg swelling, orthopnea, palpitations and syncope.  Respiratory: Negative for hemoptysis and wheezing.   Endocrine: Negative for cold intolerance and heat intolerance.  Hematologic/Lymphatic: Does not bruise/bleed easily.  Skin: Negative for nail changes.  Musculoskeletal: Negative for muscle weakness and myalgias.  Gastrointestinal: Negative for abdominal pain, change in bowel habit, nausea and vomiting.  Neurological: Negative for difficulty with concentration, dizziness, focal weakness and headaches.  Psychiatric/Behavioral: Negative for altered mental status and suicidal ideas.  All other systems reviewed and are negative.     Objective:  Blood pressure (!) 174/64, pulse 65, temperature (!) 97.3 F (36.3 C), height 6\' 3"  (1.905 m), weight 226 lb 11.2 oz (102.8 kg), SpO2 97 %. Body mass index is 28.34 kg/m.    Physical Exam  Constitutional: He is oriented to person, place, and time. Vital signs are normal. He appears well-developed and well-nourished.  Mildly obese  HENT:  Head: Normocephalic and atraumatic.  Neck: Normal range of motion.  Cardiovascular: Normal rate, regular rhythm, normal heart sounds and intact distal pulses.  Pulses:      Carotid pulses are on the right side with bruit.      Femoral pulses are 2+ on the right side with bruit and 2+ on the left side.      Popliteal pulses are 2+ on the right side and 2+ on the left side.       Dorsalis pedis pulses are 1+ on the right side and 1+ on the left side.       Posterior tibial pulses are 2+ on the right side  and 2+ on the left side.  Bilateral 1+ pitting edema  Pulmonary/Chest: Effort normal and breath sounds normal. No accessory muscle usage. No respiratory distress.  Abdominal: Soft. Bowel sounds are normal.  Obese and pannus present  Musculoskeletal: Normal range of motion.  Neurological: He is alert and oriented to person, place, and time.  Skin: Skin is warm and dry.  Vitals reviewed.  Radiology: No results found.  Laboratory examination:    CMP Latest Ref Rng & Units 03/10/2019 07/16/2018 03/15/2018  Glucose 70 - 99 mg/dL 137(H) 76 141(H)  BUN 8 - 23 mg/dL 85(H) - -  Creatinine  0.61 - 1.24 mg/dL 4.35(HH) - -  Sodium 135 - 145 mmol/L 134(L) - -  Potassium 3.5 - 5.1 mmol/L 4.8 - -  Chloride 98 - 111 mmol/L 108 - -  CO2 22 - 32 mmol/L 16(L) - -  Calcium 8.9 - 10.3 mg/dL 8.9 - -  Total Protein 6.5 - 8.1 g/dL 6.0(L) - -  Total Bilirubin 0.3 - 1.2 mg/dL 0.3 - -  Alkaline Phos 38 - 126 U/L 64 - -  AST 15 - 41 U/L 13(L) - -  ALT 0 - 44 U/L 15 - -   CBC Latest Ref Rng & Units 03/10/2019 09/09/2018 03/10/2018  WBC 4.0 - 10.5 K/uL 8.3 9.1 9.4  Hemoglobin 13.0 - 17.0 g/dL 8.4(L) 9.1(L) 9.3(L)  Hematocrit 39.0 - 52.0 % 26.2(L) 28.3(L) 28.1(L)  Platelets 150 - 400 K/uL 170 185 200   Lipid Panel     Component Value Date/Time   CHOL 115 03/15/2018 0917   TRIG 97.0 03/15/2018 0917   HDL 33.20 (L) 03/15/2018 0917   CHOLHDL 3 03/15/2018 0917   VLDL 19.4 03/15/2018 0917   LDLCALC 62 03/15/2018 0917   LDLDIRECT 60.0 05/12/2017 0909   HEMOGLOBIN A1C Lab Results  Component Value Date   HGBA1C 6.1 07/16/2018   MPG 171 12/10/2015   TSH No results for input(s): TSH in the last 8760 hours.  PRN Meds:. There are no discontinued medications. Current Meds  Medication Sig  . ALPHAGAN P 0.1 % SOLN Place 2 drops 2 (two) times daily into both eyes.   . Ascorbic Acid (VITAMIN C) 1000 MG tablet Take 1,000 mg by mouth daily.  Marland Kitchen aspirin EC 81 MG tablet Take 81 mg by mouth at bedtime.   Marland Kitchen  atorvastatin (LIPITOR) 20 MG tablet TAKE 1 TABLET BY MOUTH DAILY  . carvedilol (COREG) 25 MG tablet Take 25 mg by mouth 2 (two) times daily.   . cholecalciferol (VITAMIN D) 1000 units tablet Take 1,000 Units by mouth daily.   . cloNIDine (CATAPRES) 0.2 MG tablet Take by mouth 2 (two) times daily.   . diphenhydramine-acetaminophen (TYLENOL PM) 25-500 MG TABS tablet Take 2 tablets by mouth at bedtime.  . ferrous sulfate 325 (65 FE) MG tablet Take 325 mg by mouth 2 (two) times daily.   . finasteride (PROSCAR) 5 MG tablet Take 5 mg by mouth every evening.   . furosemide (LASIX) 80 MG tablet Take 40 mg by mouth daily.   . hydrALAZINE (APRESOLINE) 50 MG tablet Take 100 mg by mouth 3 (three) times daily.   . insulin NPH Human (HUMULIN N,NOVOLIN N) 100 UNIT/ML injection Inject 22 Units into the skin at bedtime. Inject 22 units under the skin every night.  . insulin regular (NOVOLIN R) 100 units/mL injection Inject 0.04-0.07 mLs (4-7 Units total) into the skin See admin instructions. 5 units in the morning and 7 units at dinner.  . isosorbide mononitrate (IMDUR) 120 MG 24 hr tablet TAKE 1 TABLET BY MOUTH DAILY  . Multiple Vitamin (MULTIVITAMIN WITH MINERALS) TABS tablet Take 1 tablet by mouth daily.  . nitroGLYCERIN (NITROSTAT) 0.4 MG SL tablet Place 1 tablet (0.4 mg total) under the tongue every 5 (five) minutes as needed for chest pain.  . Omega-3 Fatty Acids (FISH OIL) 1000 MG CAPS Take 1,000 mg by mouth 2 (two) times daily.  . ranolazine (RANEXA) 500 MG 12 hr tablet TAKE 1 TABLET BY MOUTH TWICE DAILY  . tamsulosin (FLOMAX) 0.4 MG CAPS capsule Take 0.4 mg by mouth  daily after supper.   . Travoprost, BAK Free, (TRAVATAN) 0.004 % SOLN ophthalmic solution Place 1 drop into both eyes at bedtime.  . triamcinolone cream (KENALOG) 0.1 % Apply 1 application topically 2 (two) times daily as needed (for itching/irritation).     Cardiac Studies:   Lexiscan Myoview Stress Test 03/30/19 Resting EKG the onset  normal sinus rhythm, nonspecific T abnormality.  Stress EKG is non-diagnostic as a pharmacologic stress test using Lexiscan. The t.i.d. index is 0.86.  Left ventricle is dilated both at rest and stress images at 281 mL.  Perfusion images reveal a large sized inferior, inferoseptal non-transmural myocardial infarction with very minimal peri-infarct ischemia.  Left ventricle systolic function Evaluated by QGS was moderately depressed at 37% with global hypokinesis. This is a high risk study.  No significant change compared to the report of nuclear stress test on 12/19/2016 although ischemia was not evident previously.  Echocardiogram 03/22/2019: Left ventricle cavity is mildly dilated. Moderate concentric hypertrophy of the left ventricle. Left ventricle regional wall motion findings: Basal anteroseptal and Mid anteroseptal hypokinesis. Moderately depressed LV systolic function with EF 35%. Doppler evidence of grade II (pseudonormal) diastolic dysfunction, elevated LAP.  Left atrial cavity is severely dilated. Trileaflet aortic valve. Trace aortic regurgitation. Moderate (Grade III) mitral regurgitation. Moderate tricuspid regurgitation. Estimated pulmonary artery systolic pressure is 54 mmHg.  11/03/2017, there is interval worsening of systolic and diastolic function, valvular regurgitation, and pulmonary hypertension.  Echocardiogram 11/03/2017: Left ventricle cavity is normal in size. Moderate concentric hypertrophy of the left ventricle. Mild decrease in global wall motion. Visual EF is 45-50%. Doppler evidence of grade I (impaired) diastolic dysfunction, normal LAP. Calculated EF 45%. Left atrial cavity is moderately dilated. 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 Mild (Grade I) mitral regurgitation. Mild tricuspid regurgitation. Mild PH.Estimated pulmonary artery systolic pressure 25-95 mmHg. IVC is dilated with blunted respiratory response. Mild PH new since echocardiogram in 2016.   Coronary Angiogram [05/22/2015]: PCI mid LAD with 3.5x26 mm Resolute DES. Periprocedural MI with septal branch occlusion.  Lower Extremity Dopplers [03/22/2015]: No hemodynamically significant stenoses are identified in the right lower extremity arterial system. Bilateral deep formal arteries show biphasic waveforms with < 50% stenosis. Moderate velocity increase at the left posterior tibial artery suggestive of >50% stenosis with diffuse disease in the AT. This exam reveals normal perfusion of both the lower extremities with ABI of 1.01.  Assessment:   Atherosclerosis of native coronary artery of native heart with angina pectoris (HCC)  Chronic combined systolic and diastolic heart failure (HCC)  Essential hypertension  Chronic kidney disease, unspecified CKD stage  EKG 03/18/2019: Normal sinus rhythm at 66 bpm, normal axis, T wave abnormality in inferolateral leads, cannot exclude ischemia.   Recommendations:   I have discussed recently obtained stress test results with the patient, no significant changes compared to 2018; however, was not noted to have mild peri-infarct ischemia.  Previously his LVEF was 45 to 50%, but is now reading at approximately 35% and is also had progression of diastolic dysfunction, valvular regurgitation, and pulmonary hypertension.  His symptoms of angina have improved since being on Ranexa.  I discussed further management options including continued medical management versus coronary angiogram as well as risk and benefits with both options.  In view of his chronic kidney disease stage IV, currently not on dialysis, he would stand significant risk of proceeding to need dialysis with coronary angiogram.  In view of this and also improvement in symptoms, he would prefer to continue with medical  management for now unless absolutely necessary then proceeding with coronary angiogram. He is aware to contact me for any worsening symptoms.    His blood pressure is also  significantly elevated, and wonder if worsening systolic dysfunction is potentially related to uncontrolled hypertension.  I will start him on amlodipine 10 mg daily.  No evidence of decompensated heart failure.  We will plan to see him back in the next few weeks to further discuss treatment options and to reevaluate his blood pressure.  Miquel Dunn, MSN, APRN, FNP-C [facility]. [place] Office: [phone] Fax: [phone]

## 2019-04-26 ENCOUNTER — Encounter: Payer: Self-pay | Admitting: Cardiology

## 2019-04-27 DIAGNOSIS — H35032 Hypertensive retinopathy, left eye: Secondary | ICD-10-CM | POA: Diagnosis not present

## 2019-04-27 DIAGNOSIS — H524 Presbyopia: Secondary | ICD-10-CM | POA: Diagnosis not present

## 2019-04-27 DIAGNOSIS — H5203 Hypermetropia, bilateral: Secondary | ICD-10-CM | POA: Diagnosis not present

## 2019-04-27 DIAGNOSIS — H401121 Primary open-angle glaucoma, left eye, mild stage: Secondary | ICD-10-CM | POA: Diagnosis not present

## 2019-04-27 DIAGNOSIS — H401111 Primary open-angle glaucoma, right eye, mild stage: Secondary | ICD-10-CM | POA: Diagnosis not present

## 2019-04-27 DIAGNOSIS — H401131 Primary open-angle glaucoma, bilateral, mild stage: Secondary | ICD-10-CM | POA: Diagnosis not present

## 2019-04-27 DIAGNOSIS — I10 Essential (primary) hypertension: Secondary | ICD-10-CM | POA: Diagnosis not present

## 2019-04-27 DIAGNOSIS — E119 Type 2 diabetes mellitus without complications: Secondary | ICD-10-CM | POA: Diagnosis not present

## 2019-04-27 DIAGNOSIS — H18413 Arcus senilis, bilateral: Secondary | ICD-10-CM | POA: Diagnosis not present

## 2019-04-27 DIAGNOSIS — H52223 Regular astigmatism, bilateral: Secondary | ICD-10-CM | POA: Diagnosis not present

## 2019-04-27 DIAGNOSIS — H25013 Cortical age-related cataract, bilateral: Secondary | ICD-10-CM | POA: Diagnosis not present

## 2019-04-27 LAB — HM DIABETES EYE EXAM

## 2019-05-03 ENCOUNTER — Other Ambulatory Visit: Payer: No Typology Code available for payment source

## 2019-05-10 ENCOUNTER — Other Ambulatory Visit: Payer: Self-pay

## 2019-05-10 ENCOUNTER — Ambulatory Visit (INDEPENDENT_AMBULATORY_CARE_PROVIDER_SITE_OTHER): Payer: Medicare Other

## 2019-05-10 DIAGNOSIS — I6523 Occlusion and stenosis of bilateral carotid arteries: Secondary | ICD-10-CM | POA: Diagnosis not present

## 2019-05-11 ENCOUNTER — Telehealth: Payer: Self-pay

## 2019-05-11 DIAGNOSIS — I5032 Chronic diastolic (congestive) heart failure: Secondary | ICD-10-CM | POA: Diagnosis not present

## 2019-05-11 DIAGNOSIS — E119 Type 2 diabetes mellitus without complications: Secondary | ICD-10-CM | POA: Diagnosis not present

## 2019-05-11 DIAGNOSIS — I2510 Atherosclerotic heart disease of native coronary artery without angina pectoris: Secondary | ICD-10-CM | POA: Diagnosis not present

## 2019-05-11 DIAGNOSIS — D638 Anemia in other chronic diseases classified elsewhere: Secondary | ICD-10-CM | POA: Diagnosis not present

## 2019-05-11 DIAGNOSIS — E1122 Type 2 diabetes mellitus with diabetic chronic kidney disease: Secondary | ICD-10-CM | POA: Diagnosis not present

## 2019-05-11 DIAGNOSIS — N184 Chronic kidney disease, stage 4 (severe): Secondary | ICD-10-CM | POA: Diagnosis not present

## 2019-05-11 DIAGNOSIS — H409 Unspecified glaucoma: Secondary | ICD-10-CM | POA: Diagnosis not present

## 2019-05-11 DIAGNOSIS — I10 Essential (primary) hypertension: Secondary | ICD-10-CM | POA: Diagnosis not present

## 2019-05-11 DIAGNOSIS — I25118 Atherosclerotic heart disease of native coronary artery with other forms of angina pectoris: Secondary | ICD-10-CM | POA: Diagnosis not present

## 2019-05-11 DIAGNOSIS — N400 Enlarged prostate without lower urinary tract symptoms: Secondary | ICD-10-CM | POA: Diagnosis not present

## 2019-05-11 DIAGNOSIS — E785 Hyperlipidemia, unspecified: Secondary | ICD-10-CM | POA: Diagnosis not present

## 2019-05-11 DIAGNOSIS — I5022 Chronic systolic (congestive) heart failure: Secondary | ICD-10-CM | POA: Diagnosis not present

## 2019-05-11 MED ORDER — RANOLAZINE ER 500 MG PO TB12: 1000.0000 mg | ORAL_TABLET | Freq: Two times a day (BID) | ORAL | 1 refills | Status: AC

## 2019-05-11 NOTE — Telephone Encounter (Signed)
Telephone encounter:  Reason for call: Refill on Ranolazine. Patient says that dosage was changed from 500mg  bid to 1000mg  bid. He would like this sent in. I could not find that medication was increased. Please advise.  Usual provider: Jeri Lager, NP  Last office visit: 04/25/19   Next office visit: 05/13/19   Last hospitalization: 04/28/2017   Current Outpatient Medications on File Prior to Visit  Medication Sig Dispense Refill  . ALPHAGAN P 0.1 % SOLN Place 2 drops 2 (two) times daily into both eyes.   0  . amLODipine (NORVASC) 5 MG tablet Take 1 tablet (5 mg total) by mouth daily. 30 tablet 2  . Ascorbic Acid (VITAMIN C) 1000 MG tablet Take 1,000 mg by mouth daily.    Marland Kitchen aspirin EC 81 MG tablet Take 81 mg by mouth at bedtime.     Marland Kitchen atorvastatin (LIPITOR) 20 MG tablet TAKE 1 TABLET BY MOUTH DAILY 90 tablet 0  . carvedilol (COREG) 25 MG tablet Take 25 mg by mouth 2 (two) times daily.   0  . cholecalciferol (VITAMIN D) 1000 units tablet Take 1,000 Units by mouth daily.     . cloNIDine (CATAPRES) 0.2 MG tablet Take by mouth 2 (two) times daily.   0  . CONTOUR NEXT TEST test strip USE AS DIRECTED TO CHECK BLOOD SUGAR TWICE A DAY 100 strip 2  . diphenhydramine-acetaminophen (TYLENOL PM) 25-500 MG TABS tablet Take 2 tablets by mouth at bedtime.    . ferrous sulfate 325 (65 FE) MG tablet Take 325 mg by mouth 2 (two) times daily.     . finasteride (PROSCAR) 5 MG tablet Take 5 mg by mouth every evening.     . furosemide (LASIX) 80 MG tablet Take 40 mg by mouth daily.   1  . hydrALAZINE (APRESOLINE) 50 MG tablet Take 100 mg by mouth 3 (three) times daily.   0  . insulin NPH Human (HUMULIN N,NOVOLIN N) 100 UNIT/ML injection Inject 22 Units into the skin at bedtime. Inject 22 units under the skin every night.    . insulin regular (NOVOLIN R) 100 units/mL injection Inject 0.04-0.07 mLs (4-7 Units total) into the skin See admin instructions. 5 units in the morning and 7 units at dinner. 10 mL 3   . isosorbide mononitrate (IMDUR) 120 MG 24 hr tablet TAKE 1 TABLET BY MOUTH DAILY 30 tablet 4  . Multiple Vitamin (MULTIVITAMIN WITH MINERALS) TABS tablet Take 1 tablet by mouth daily.    . nitroGLYCERIN (NITROSTAT) 0.4 MG SL tablet Place 1 tablet (0.4 mg total) under the tongue every 5 (five) minutes as needed for chest pain. 90 tablet 3  . Omega-3 Fatty Acids (FISH OIL) 1000 MG CAPS Take 1,000 mg by mouth 2 (two) times daily.    . ranolazine (RANEXA) 500 MG 12 hr tablet TAKE 1 TABLET BY MOUTH TWICE DAILY 180 tablet 1  . tamsulosin (FLOMAX) 0.4 MG CAPS capsule Take 0.4 mg by mouth daily after supper.     . Travoprost, BAK Free, (TRAVATAN) 0.004 % SOLN ophthalmic solution Place 1 drop into both eyes at bedtime.    . triamcinolone cream (KENALOG) 0.1 % Apply 1 application topically 2 (two) times daily as needed (for itching/irritation).   0   No current facility-administered medications on file prior to visit.

## 2019-05-11 NOTE — Telephone Encounter (Signed)
Yeah it was increased prior to his last office visit. Please send in Ranexa 1000 mg BID

## 2019-05-12 ENCOUNTER — Other Ambulatory Visit: Payer: Self-pay | Admitting: Cardiology

## 2019-05-12 DIAGNOSIS — N189 Chronic kidney disease, unspecified: Secondary | ICD-10-CM | POA: Diagnosis not present

## 2019-05-12 DIAGNOSIS — N2581 Secondary hyperparathyroidism of renal origin: Secondary | ICD-10-CM | POA: Diagnosis not present

## 2019-05-12 DIAGNOSIS — I120 Hypertensive chronic kidney disease with stage 5 chronic kidney disease or end stage renal disease: Secondary | ICD-10-CM | POA: Diagnosis not present

## 2019-05-12 DIAGNOSIS — D631 Anemia in chronic kidney disease: Secondary | ICD-10-CM | POA: Diagnosis not present

## 2019-05-12 DIAGNOSIS — N185 Chronic kidney disease, stage 5: Secondary | ICD-10-CM | POA: Diagnosis not present

## 2019-05-12 DIAGNOSIS — I6523 Occlusion and stenosis of bilateral carotid arteries: Secondary | ICD-10-CM

## 2019-05-13 ENCOUNTER — Ambulatory Visit: Payer: No Typology Code available for payment source | Admitting: Cardiology

## 2019-05-14 DIAGNOSIS — Z23 Encounter for immunization: Secondary | ICD-10-CM | POA: Diagnosis not present

## 2019-05-30 ENCOUNTER — Other Ambulatory Visit: Payer: Self-pay

## 2019-05-30 MED ORDER — ISOSORBIDE MONONITRATE ER 120 MG PO TB24: 120.0000 mg | ORAL_TABLET | Freq: Every day | ORAL | 4 refills | Status: DC

## 2019-06-01 ENCOUNTER — Other Ambulatory Visit: Payer: Self-pay | Admitting: Endocrinology

## 2019-06-09 ENCOUNTER — Ambulatory Visit (INDEPENDENT_AMBULATORY_CARE_PROVIDER_SITE_OTHER): Payer: Medicare Other | Admitting: Cardiology

## 2019-06-09 ENCOUNTER — Other Ambulatory Visit: Payer: Self-pay

## 2019-06-09 ENCOUNTER — Encounter: Payer: Self-pay | Admitting: Cardiology

## 2019-06-09 VITALS — BP 163/54 | HR 61 | Ht 75.0 in | Wt 221.0 lb

## 2019-06-09 DIAGNOSIS — I10 Essential (primary) hypertension: Secondary | ICD-10-CM | POA: Diagnosis not present

## 2019-06-09 DIAGNOSIS — I5042 Chronic combined systolic (congestive) and diastolic (congestive) heart failure: Secondary | ICD-10-CM

## 2019-06-09 DIAGNOSIS — I6523 Occlusion and stenosis of bilateral carotid arteries: Secondary | ICD-10-CM | POA: Diagnosis not present

## 2019-06-09 DIAGNOSIS — I25119 Atherosclerotic heart disease of native coronary artery with unspecified angina pectoris: Secondary | ICD-10-CM | POA: Diagnosis not present

## 2019-06-09 MED ORDER — ISOSORBIDE DINITRATE 30 MG PO TABS: 30.0000 mg | ORAL_TABLET | Freq: Three times a day (TID) | ORAL | 2 refills | Status: AC

## 2019-06-09 NOTE — Progress Notes (Signed)
Primary Physician:  [name], MD   Patient ID: Ray Merritt, male    DOB: 1936/03/12, 83 y.o.   MRN: [id number]  Subjective:    Chief Complaint  Patient presents with  . Coronary Artery Disease  . Hypertension  . Follow-up    6 month    HPI: Ray Merritt  is a 83 y.o. male  with hypertension, hyperlipidemia, controlled type 2 diabetes, stage IV chronic kidney disease, CAD S/P angioplasty 05/22/2015 with 3.5 mm resolute DES to the mid LAD, Moderate 50-60% stenosis and RCA.  Symptoms of angina has improved on Ranexa.  He is followed by Dr. Florene Glen for CKD and has left forearm AV fistula placed in October 2018. Also followed by Dr. Dwyane Dee for diabetes management and over the past one year, A1c has consistently been <7%.  He now presents for f/u of hypertension and CAD and this is his 3 week f/u. On his last OV, Amlodipine increased to 10 mg. He has chronic dyspnea, denies PND or orthopnea. He has not had angina recently.   Past Medical History:  Diagnosis Date  . Anemia   . Arthritis   . Cancer ([place]) 2015-present   CLL  . Chronic kidney disease   . Coronary artery disease 04/2016   1 stent  . Diabetes mellitus    insulin dependent   . Enlarged prostate   . Headache   . Hypertension   . Pneumonia     Past Surgical History:  Procedure Laterality Date  . AV FISTULA PLACEMENT Left 04/28/2017   Procedure: Left arm ARTERIOVENOUS (AV) FISTULA CREATION;  Surgeon: Angelia Mould, MD;  Location: [facility];  Service: Vascular;  Laterality: Left;  . CARDIAC CATHETERIZATION N/A 05/01/2015   Procedure: Left Heart Cath and Coronary Angiography;  Surgeon: Adrian Prows, MD;  Location: [facility];  Service: Cardiovascular;  Laterality: N/A;  . CARDIAC CATHETERIZATION  05/01/2015   Procedure: Intravascular Pressure Wire/FFR Study;  Surgeon: Adrian Prows, MD;  Location: [facility];  Service: Cardiovascular;;  . CARDIAC CATHETERIZATION N/A 05/22/2015   Procedure:  Left Heart Cath and Coronary Angiography;  Surgeon: Adrian Prows, MD;  Location: [facility];  Service: Cardiovascular;  Laterality: N/A;  . CARDIAC CATHETERIZATION N/A 05/22/2015   Procedure: Coronary Stent Intervention;  Surgeon: Adrian Prows, MD;  Location: [facility];  Service: Cardiovascular;  Laterality: N/A;  . COLONOSCOPY W/ BIOPSIES AND POLYPECTOMY    . SHOULDER SURGERY    . TONSILLECTOMY      Social History   Socioeconomic History  . Marital status: Married    Spouse name: Not on file  . Number of children: 3  . Years of education: Not on file  . Highest education level: Not on file  Occupational History  . Occupation: retired  Scientific laboratory technician  . Financial resource strain: Not on file  . Food insecurity    Worry: Not on file    Inability: Not on file  . Transportation needs    Medical: Not on file    Non-medical: Not on file  Tobacco Use  . Smoking status: Former Smoker    Packs/day: 2.00    Types: Cigarettes  . Smokeless tobacco: Never Used  . Tobacco comment: quit smoking in early 70's, started ate age 38  Substance and Sexual Activity  . Alcohol use: No  . Drug use: No  . Sexual activity: Not on file  Lifestyle  . Physical activity  Days per week: Not on file    Minutes per session: Not on file  . Stress: Not on file  Relationships  . Social Herbalist on phone: Not on file    Gets together: Not on file    Attends religious service: Not on file    Active member of club or organization: Not on file    Attends meetings of clubs or organizations: Not on file    Relationship status: Not on file  . Intimate partner violence    Fear of current or ex partner: Not on file    Emotionally abused: Not on file    Physically abused: Not on file    Forced sexual activity: Not on file  Other Topics Concern  . Not on file  Social History Narrative  . Not on file    Review of Systems  Constitution: Negative for decreased appetite,  malaise/fatigue, weight gain and weight loss.  Eyes: Negative for visual disturbance.  Cardiovascular: Positive for claudication and dyspnea on exertion. Negative for chest pain, leg swelling, orthopnea, palpitations and syncope.  Respiratory: Negative for hemoptysis and wheezing.   Endocrine: Negative for cold intolerance and heat intolerance.  Hematologic/Lymphatic: Does not bruise/bleed easily.  Skin: Negative for nail changes.  Musculoskeletal: Negative for muscle weakness and myalgias.  Gastrointestinal: Negative for abdominal pain, change in bowel habit, nausea and vomiting.  Neurological: Negative for difficulty with concentration, dizziness, focal weakness and headaches.  Psychiatric/Behavioral: Negative for altered mental status and suicidal ideas.  All other systems reviewed and are negative.     Objective:   Vitals with BMI 06/09/2019 06/09/2019 04/25/2019  Height - 6\' 3"  6\' 3"   Weight - 221 lbs 226 lbs 11 oz  BMI - 40.08 67.61  Systolic 950 932 671  Diastolic 54 49 64  Pulse 61 64 65     Physical Exam  Constitutional: He is oriented to person, place, and time. Vital signs are normal. He appears well-developed and well-nourished.  Mildly obese  HENT:  Head: Normocephalic and atraumatic.  Neck: Normal range of motion.  Cardiovascular: Normal rate, regular rhythm, normal heart sounds and intact distal pulses.  Pulses:      Carotid pulses are on the right side with bruit.      Femoral pulses are 2+ on the right side with bruit and 2+ on the left side.      Popliteal pulses are 2+ on the right side and 2+ on the left side.       Dorsalis pedis pulses are 1+ on the right side and 1+ on the left side.       Posterior tibial pulses are 2+ on the right side and 2+ on the left side.  Bilateral 1+ pitting edema  Pulmonary/Chest: Effort normal and breath sounds normal. No accessory muscle usage. No respiratory distress.  Abdominal: Soft. Bowel sounds are normal.  Obese and  pannus present  Musculoskeletal: Normal range of motion.  Neurological: He is alert and oriented to person, place, and time.  Skin: Skin is warm and dry.  Vitals reviewed.  Radiology: No results found.  Laboratory examination:   CMP Latest Ref Rng & Units 03/10/2019 07/16/2018 03/15/2018  Glucose 70 - 99 mg/dL 137(H) 76 141(H)  BUN 8 - 23 mg/dL 85(H) - -  Creatinine 0.61 - 1.24 mg/dL 4.35(HH) - -  Sodium 135 - 145 mmol/L 134(L) - -  Potassium 3.5 - 5.1 mmol/L 4.8 - -  Chloride 98 - 111  mmol/L 108 - -  CO2 22 - 32 mmol/L 16(L) - -  Calcium 8.9 - 10.3 mg/dL 8.9 - -  Total Protein 6.5 - 8.1 g/dL 6.0(L) - -  Total Bilirubin 0.3 - 1.2 mg/dL 0.3 - -  Alkaline Phos 38 - 126 U/L 64 - -  AST 15 - 41 U/L 13(L) - -  ALT 0 - 44 U/L 15 - -   CBC Latest Ref Rng & Units 03/10/2019 09/09/2018 03/10/2018  WBC 4.0 - 10.5 K/uL 8.3 9.1 9.4  Hemoglobin 13.0 - 17.0 g/dL 8.4(L) 9.1(L) 9.3(L)  Hematocrit 39.0 - 52.0 % 26.2(L) 28.3(L) 28.1(L)  Platelets 150 - 400 K/uL 170 185 200   Lipid Panel     Component Value Date/Time   CHOL 115 03/15/2018 0917   TRIG 97.0 03/15/2018 0917   HDL 33.20 (L) 03/15/2018 0917   CHOLHDL 3 03/15/2018 0917   VLDL 19.4 03/15/2018 0917   LDLCALC 62 03/15/2018 0917   LDLDIRECT 60.0 05/12/2017 0909   HEMOGLOBIN A1C Lab Results  Component Value Date   HGBA1C 6.1 07/16/2018   MPG 171 12/10/2015   TSH No results for input(s): TSH in the last 8760 hours.  PRN Meds:. Medications Discontinued During This Encounter  Medication Reason  . isosorbide mononitrate (IMDUR) 120 MG 24 hr tablet Change in therapy   Current Meds  Medication Sig  . ALPHAGAN P 0.1 % SOLN Place 2 drops 2 (two) times daily into both eyes.   Marland Kitchen amLODipine (NORVASC) 5 MG tablet Take 1 tablet (5 mg total) by mouth daily.  . Ascorbic Acid (VITAMIN C) 1000 MG tablet Take 1,000 mg by mouth daily.  Marland Kitchen aspirin EC 81 MG tablet Take 81 mg by mouth at bedtime.   Marland Kitchen atorvastatin (LIPITOR) 20 MG tablet TAKE 1  TABLET BY MOUTH DAILY  . carvedilol (COREG) 25 MG tablet Take 25 mg by mouth 2 (two) times daily.   . cholecalciferol (VITAMIN D) 1000 units tablet Take 1,000 Units by mouth daily.   . cloNIDine (CATAPRES) 0.2 MG tablet Take by mouth 2 (two) times daily.   . CONTOUR NEXT TEST test strip USE AS DIRECTED TO CHECK BLOOD SUGAR TWICE A DAY  . diphenhydramine-acetaminophen (TYLENOL PM) 25-500 MG TABS tablet Take 2 tablets by mouth at bedtime.  . ferrous sulfate 325 (65 FE) MG tablet Take 325 mg by mouth 2 (two) times daily.   . finasteride (PROSCAR) 5 MG tablet Take 5 mg by mouth every evening.   . furosemide (LASIX) 80 MG tablet Take 40 mg by mouth daily.   . hydrALAZINE (APRESOLINE) 50 MG tablet Take 100 mg by mouth 3 (three) times daily.   . insulin NPH Human (HUMULIN N,NOVOLIN N) 100 UNIT/ML injection Inject 22 Units into the skin at bedtime. Inject 22 units under the skin every night.  . insulin regular (NOVOLIN R) 100 units/mL injection Inject 0.04-0.07 mLs (4-7 Units total) into the skin See admin instructions. 5 units in the morning and 7 units at dinner.  . Multiple Vitamin (MULTIVITAMIN WITH MINERALS) TABS tablet Take 1 tablet by mouth daily.  . nitroGLYCERIN (NITROSTAT) 0.4 MG SL tablet Place 1 tablet (0.4 mg total) under the tongue every 5 (five) minutes as needed for chest pain.  . Omega-3 Fatty Acids (FISH OIL) 1000 MG CAPS Take 1,000 mg by mouth 2 (two) times daily.  . ranolazine (RANEXA) 500 MG 12 hr tablet Take 2 tablets (1,000 mg total) by mouth 2 (two) times daily.  . tamsulosin (  FLOMAX) 0.4 MG CAPS capsule Take 0.4 mg by mouth daily after supper.   . Travoprost, BAK Free, (TRAVATAN) 0.004 % SOLN ophthalmic solution Place 1 drop into both eyes at bedtime.  . triamcinolone cream (KENALOG) 0.1 % Apply 1 application topically 2 (two) times daily as needed (for itching/irritation).   . [DISCONTINUED] isosorbide mononitrate (IMDUR) 120 MG 24 hr tablet Take 1 tablet (120 mg total) by mouth  daily.    Cardiac Studies:   Coronary Angiogram [05/22/2015]: PCI mid LAD with 3.5x26 mm Resolute DES. Periprocedural MI with septal branch occlusion.  Lower Extremity Dopplers [03/22/2015]: No hemodynamically significant stenoses are identified in the right lower extremity arterial system. Bilateral deep formal arteries show biphasic waveforms with < 50% stenosis. Moderate velocity increase at the left posterior tibial artery suggestive of >50% stenosis with diffuse disease in the AT. This exam reveals normal perfusion of both the lower extremities with ABI of 1.01.  Lexiscan Myoview Stress Test 03/30/19 Resting EKG the onset normal sinus rhythm, nonspecific T abnormality.  Stress EKG is non-diagnostic as a pharmacologic stress test using Lexiscan. The t.i.d. index is 0.86.  Left ventricle is dilated both at rest and stress images at 281 mL.  Perfusion images reveal a large sized inferior, inferoseptal non-transmural myocardial infarction with very minimal peri-infarct ischemia.  Left ventricle systolic function Evaluated by QGS was moderately depressed at 37% with global hypokinesis. This is a high risk study.  No significant change compared to the report of nuclear stress test on 12/19/2016 although ischemia was not evident previously.  Echocardiogram 03/22/2019: Left ventricle cavity is mildly dilated. Moderate concentric hypertrophy of the left ventricle. Left ventricle regional wall motion findings: Basal anteroseptal and Mid anteroseptal hypokinesis. Moderately depressed LV systolic function with EF 35%. Doppler evidence of grade II (pseudonormal) diastolic dysfunction, elevated LAP.  Left atrial cavity is severely dilated. Trileaflet aortic valve. Trace aortic regurgitation. Moderate (Grade III) mitral regurgitation. Moderate tricuspid regurgitation. Estimated pulmonary artery systolic pressure is 54 mmHg.  11/03/2017, there is interval worsening of systolic and diastolic function,  valvular regurgitation, and pulmonary hypertension.  Carotid artery duplex  05/10/2019: Stenosis in the right external carotid artery (<50%). Stenosis in the left internal carotid artery (50-69%). Stenosis in the left external carotid artery (<50%). Antegrade right vertebral artery flow. Antegrade left vertebral artery flow. Compared to the study done on 04/30/2018, left ICA stenosis severity has progressed from less than 50%. Follow up in six months is appropriate if clinically indicated.  Assessment:     ICD-10-CM   1. Essential hypertension  I10 isosorbide dinitrate (ISORDIL) 30 MG tablet  2. Atherosclerosis of native coronary artery of native heart with angina pectoris ([place])  I25.119   3. Chronic combined systolic and diastolic heart failure (HCC)  I50.42 isosorbide dinitrate (ISORDIL) 30 MG tablet  4. Asymptomatic bilateral carotid artery stenosis  I65.23     EKG 03/18/2019: Normal sinus rhythm at 66 bpm, normal axis, T wave abnormality in inferolateral leads, cannot exclude ischemia.   Recommendations:   MELBURN TREIBER  is a 83 y.o. male  with hypertension, hyperlipidemia, uncontrolled type 2 diabetes, stage IV chronic kidney disease, CAD S/P angioplasty 05/22/2015 with 3.5 mm resolute DES to the mid LAD, Moderate 50-60% stenosis and RCA.  Symptoms of angina has improved on Ranexa.  Recent stress testing in September 2020 had revealed dilated LV with moderate decrease in LV systolic function and large inferior margin with mild peri-infarct ischemia.  Also his echocardiogram essentially revealed severe decrease in  LVEF at 35%.  He is being medically managed due to stage IV chronic kidney disease.  Patient was seen by [place] 3 weeks ago for hypertension management and heart failure and CAD management.  Amlodipine was increased to 5 mg.  Blood pressure is still elevated.  I have discontinued isosorbide mononitrate and switched him to isosorbide dinitrate 30 mg p.o. t.i.d.  I would like to see  him back in 4 weeks for follow-up.  I have strictly warned the patient regarding continued weight loss otherwise he will have increased cardiac mortality or she will end up being on dialysis with uncontrolled hypertension.  He also did not bring his medication list which was started bringing on his next visits regularly.  Today he is not in acute decompensated heart failure.  I also reviewed the carotid artery duplex, there is progression of carotid disease on the left.  Lipids are very well controlled.  Diabetes  Is now well-controlled.  Hopefully controlling the blood pressure goal keep his carotid artery disease progression low.  Adrian Prows, MD, [facility] 06/09/2019, 4:44 PM [facility]. [place] Pager: [phone] Office: [phone] If no answer Cell [phone]

## 2019-06-10 DIAGNOSIS — E1122 Type 2 diabetes mellitus with diabetic chronic kidney disease: Secondary | ICD-10-CM | POA: Diagnosis not present

## 2019-06-10 DIAGNOSIS — Z1389 Encounter for screening for other disorder: Secondary | ICD-10-CM | POA: Diagnosis not present

## 2019-06-10 DIAGNOSIS — I2510 Atherosclerotic heart disease of native coronary artery without angina pectoris: Secondary | ICD-10-CM | POA: Diagnosis not present

## 2019-06-10 DIAGNOSIS — D539 Nutritional anemia, unspecified: Secondary | ICD-10-CM | POA: Diagnosis not present

## 2019-06-10 DIAGNOSIS — Z0000 Encounter for general adult medical examination without abnormal findings: Secondary | ICD-10-CM | POA: Diagnosis not present

## 2019-06-10 DIAGNOSIS — I5022 Chronic systolic (congestive) heart failure: Secondary | ICD-10-CM | POA: Diagnosis not present

## 2019-06-10 DIAGNOSIS — Z794 Long term (current) use of insulin: Secondary | ICD-10-CM | POA: Diagnosis not present

## 2019-06-10 DIAGNOSIS — C9000 Multiple myeloma not having achieved remission: Secondary | ICD-10-CM | POA: Diagnosis not present

## 2019-06-10 DIAGNOSIS — N400 Enlarged prostate without lower urinary tract symptoms: Secondary | ICD-10-CM | POA: Diagnosis not present

## 2019-06-10 DIAGNOSIS — E119 Type 2 diabetes mellitus without complications: Secondary | ICD-10-CM | POA: Diagnosis not present

## 2019-06-10 DIAGNOSIS — H409 Unspecified glaucoma: Secondary | ICD-10-CM | POA: Diagnosis not present

## 2019-06-10 DIAGNOSIS — I10 Essential (primary) hypertension: Secondary | ICD-10-CM | POA: Diagnosis not present

## 2019-06-10 DIAGNOSIS — C9190 Lymphoid leukemia, unspecified not having achieved remission: Secondary | ICD-10-CM | POA: Diagnosis not present

## 2019-06-10 DIAGNOSIS — E785 Hyperlipidemia, unspecified: Secondary | ICD-10-CM | POA: Diagnosis not present

## 2019-06-10 DIAGNOSIS — N184 Chronic kidney disease, stage 4 (severe): Secondary | ICD-10-CM | POA: Diagnosis not present

## 2019-06-13 DIAGNOSIS — R972 Elevated prostate specific antigen [PSA]: Secondary | ICD-10-CM | POA: Diagnosis not present

## 2019-06-13 DIAGNOSIS — N400 Enlarged prostate without lower urinary tract symptoms: Secondary | ICD-10-CM | POA: Diagnosis not present

## 2019-06-16 ENCOUNTER — Other Ambulatory Visit: Payer: Self-pay | Admitting: Endocrinology

## 2019-06-21 ENCOUNTER — Telehealth: Payer: Self-pay | Admitting: Endocrinology

## 2019-06-21 DIAGNOSIS — A419 Sepsis, unspecified organism: Secondary | ICD-10-CM

## 2019-06-21 HISTORY — DX: Sepsis, unspecified organism: A41.9

## 2019-06-21 NOTE — Telephone Encounter (Signed)
-----   Message from Elayne Snare, MD sent at 06/16/2019  6:59 PM EST ----- Regarding: appt Patient overdue for appointment.  Please call to schedule with labs before appointment

## 2019-06-21 NOTE — Telephone Encounter (Signed)
PT asked me to call his daughter-ATC and there was no answer and no voicemail

## 2019-07-01 ENCOUNTER — Telehealth: Payer: Self-pay | Admitting: Internal Medicine

## 2019-07-01 DIAGNOSIS — N185 Chronic kidney disease, stage 5: Secondary | ICD-10-CM | POA: Diagnosis not present

## 2019-07-01 NOTE — Telephone Encounter (Signed)
Scheduled per 12/11 scheduled message, called patient and informed him of appointment date and time.

## 2019-07-05 ENCOUNTER — Other Ambulatory Visit: Payer: Self-pay

## 2019-07-05 ENCOUNTER — Telehealth: Payer: Self-pay | Admitting: Internal Medicine

## 2019-07-05 ENCOUNTER — Encounter: Payer: Self-pay | Admitting: Internal Medicine

## 2019-07-05 ENCOUNTER — Inpatient Hospital Stay: Payer: Medicare Other | Attending: Internal Medicine | Admitting: Internal Medicine

## 2019-07-05 VITALS — BP 147/53 | HR 64 | Temp 98.9°F | Resp 17 | Ht 75.0 in | Wt 226.3 lb

## 2019-07-05 DIAGNOSIS — Z79899 Other long term (current) drug therapy: Secondary | ICD-10-CM | POA: Insufficient documentation

## 2019-07-05 DIAGNOSIS — N189 Chronic kidney disease, unspecified: Secondary | ICD-10-CM

## 2019-07-05 DIAGNOSIS — C9110 Chronic lymphocytic leukemia of B-cell type not having achieved remission: Secondary | ICD-10-CM | POA: Diagnosis not present

## 2019-07-05 DIAGNOSIS — N2581 Secondary hyperparathyroidism of renal origin: Secondary | ICD-10-CM | POA: Diagnosis not present

## 2019-07-05 DIAGNOSIS — I120 Hypertensive chronic kidney disease with stage 5 chronic kidney disease or end stage renal disease: Secondary | ICD-10-CM | POA: Diagnosis not present

## 2019-07-05 DIAGNOSIS — D631 Anemia in chronic kidney disease: Secondary | ICD-10-CM | POA: Diagnosis not present

## 2019-07-05 DIAGNOSIS — E119 Type 2 diabetes mellitus without complications: Secondary | ICD-10-CM | POA: Insufficient documentation

## 2019-07-05 DIAGNOSIS — N400 Enlarged prostate without lower urinary tract symptoms: Secondary | ICD-10-CM | POA: Insufficient documentation

## 2019-07-05 DIAGNOSIS — I6523 Occlusion and stenosis of bilateral carotid arteries: Secondary | ICD-10-CM | POA: Diagnosis not present

## 2019-07-05 DIAGNOSIS — I10 Essential (primary) hypertension: Secondary | ICD-10-CM

## 2019-07-05 DIAGNOSIS — Z7982 Long term (current) use of aspirin: Secondary | ICD-10-CM | POA: Insufficient documentation

## 2019-07-05 DIAGNOSIS — Z794 Long term (current) use of insulin: Secondary | ICD-10-CM | POA: Insufficient documentation

## 2019-07-05 DIAGNOSIS — N185 Chronic kidney disease, stage 5: Secondary | ICD-10-CM | POA: Diagnosis not present

## 2019-07-05 NOTE — Telephone Encounter (Signed)
Scheduled appt per 12/15 los.  Printed calendar and avs. 

## 2019-07-05 NOTE — Progress Notes (Signed)
[facility] [phone]   [phone]  OFFICE PROGRESS NOTE  [name], MD [address] [place] [address]  DIAGNOSIS: Chronic lymphocytic leukemia diagnosed in July 2015  PRIOR THERAPY: None  CURRENT THERAPY: Observation.  INTERVAL HISTORY: Ray Merritt 83 y.o. male returns to the clinic today for follow-up visit.  The patient is feeling fine today with no concerning complaints.  He denied having any chest pain, shortness of breath, cough or hemoptysis.  He denied having any recent weight loss or night sweats.  He has no nausea, vomiting, diarrhea or constipation.  He is followed by Dr. Posey Pronto from nephrology for his chronic renal insufficiency.  He is not currently on hemodialysis.  The patient is here today for evaluation with repeat blood work performed recently.  MEDICAL HISTORY: Past Medical History:  Diagnosis Date  . Anemia   . Arthritis   . Cancer ([place]) 2015-present   CLL  . Chronic kidney disease   . Coronary artery disease 04/2016   1 stent  . Diabetes mellitus    insulin dependent   . Enlarged prostate   . Headache   . Hypertension   . Pneumonia     ALLERGIES:  has No Known Allergies.  MEDICATIONS:  Current Outpatient Medications  Medication Sig Dispense Refill  . ALPHAGAN P 0.1 % SOLN Place 2 drops 2 (two) times daily into both eyes.   0  . amLODipine (NORVASC) 5 MG tablet Take 1 tablet (5 mg total) by mouth daily. 30 tablet 2  . Ascorbic Acid (VITAMIN C) 1000 MG tablet Take 1,000 mg by mouth daily.    Marland Kitchen aspirin EC 81 MG tablet Take 81 mg by mouth at bedtime.     Marland Kitchen atorvastatin (LIPITOR) 20 MG tablet TAKE 1 TABLET BY MOUTH DAILY 90 tablet 0  . carvedilol (COREG) 25 MG tablet Take 25 mg by mouth 2 (two) times daily.   0  . cholecalciferol (VITAMIN D) 1000 units tablet Take 1,000 Units by mouth daily.     . cloNIDine (CATAPRES) 0.2 MG tablet Take by mouth 2 (two) times daily.   0  .  CONTOUR NEXT TEST test strip USE AS DIRECTED TO CHECK BLOOD SUGAR TWICE A DAY 100 strip 2  . diphenhydramine-acetaminophen (TYLENOL PM) 25-500 MG TABS tablet Take 2 tablets by mouth at bedtime.    . ferrous sulfate 325 (65 FE) MG tablet Take 325 mg by mouth 2 (two) times daily.     . finasteride (PROSCAR) 5 MG tablet Take 5 mg by mouth every evening.     . furosemide (LASIX) 80 MG tablet Take 40 mg by mouth daily.   1  . hydrALAZINE (APRESOLINE) 50 MG tablet Take 100 mg by mouth 3 (three) times daily.   0  . insulin NPH Human (HUMULIN N,NOVOLIN N) 100 UNIT/ML injection Inject 22 Units into the skin at bedtime. Inject 22 units under the skin every night.    . isosorbide dinitrate (ISORDIL) 30 MG tablet Take 1 tablet (30 mg total) by mouth 3 (three) times daily. 90 tablet 2  . Multiple Vitamin (MULTIVITAMIN WITH MINERALS) TABS tablet Take 1 tablet by mouth daily.    Marland Kitchen NOVOLIN R 100 UNIT/ML injection INJECT 4-7 UNITS SUBCUTANEOUS. 5 UNITS EVERY MORNING AND 7 UNITS AT DINNER 10 mL 0  . Omega-3 Fatty Acids (FISH OIL) 1000 MG CAPS Take 1,000 mg by mouth 2 (two) times daily.    Marland Kitchen  ranolazine (RANEXA) 500 MG 12 hr tablet Take 2 tablets (1,000 mg total) by mouth 2 (two) times daily. 180 tablet 1  . tamsulosin (FLOMAX) 0.4 MG CAPS capsule Take 0.4 mg by mouth daily after supper.     . Travoprost, BAK Free, (TRAVATAN) 0.004 % SOLN ophthalmic solution Place 1 drop into both eyes at bedtime.    . triamcinolone cream (KENALOG) 0.1 % Apply 1 application topically 2 (two) times daily as needed (for itching/irritation).   0  . nitroGLYCERIN (NITROSTAT) 0.4 MG SL tablet Place 1 tablet (0.4 mg total) under the tongue every 5 (five) minutes as needed for chest pain. 90 tablet 3   No current facility-administered medications for this visit.    SURGICAL HISTORY:  Past Surgical History:  Procedure Laterality Date  . AV FISTULA PLACEMENT Left 04/28/2017   Procedure: Left arm ARTERIOVENOUS (AV) FISTULA CREATION;   Surgeon: Angelia Mould, MD;  Location: [facility];  Service: Vascular;  Laterality: Left;  . CARDIAC CATHETERIZATION N/A 05/01/2015   Procedure: Left Heart Cath and Coronary Angiography;  Surgeon: Adrian Prows, MD;  Location: [facility];  Service: Cardiovascular;  Laterality: N/A;  . CARDIAC CATHETERIZATION  05/01/2015   Procedure: Intravascular Pressure Wire/FFR Study;  Surgeon: Adrian Prows, MD;  Location: [facility];  Service: Cardiovascular;;  . CARDIAC CATHETERIZATION N/A 05/22/2015   Procedure: Left Heart Cath and Coronary Angiography;  Surgeon: Adrian Prows, MD;  Location: [facility];  Service: Cardiovascular;  Laterality: N/A;  . CARDIAC CATHETERIZATION N/A 05/22/2015   Procedure: Coronary Stent Intervention;  Surgeon: Adrian Prows, MD;  Location: [facility];  Service: Cardiovascular;  Laterality: N/A;  . COLONOSCOPY W/ BIOPSIES AND POLYPECTOMY    . SHOULDER SURGERY    . TONSILLECTOMY      REVIEW OF SYSTEMS:  A comprehensive review of systems was negative.   PHYSICAL EXAMINATION: General appearance: alert, cooperative and no distress Head: Normocephalic, without obvious abnormality, atraumatic Neck: no adenopathy, no JVD, supple, symmetrical, trachea midline and thyroid not enlarged, symmetric, no tenderness/mass/nodules Lymph nodes: Cervical, supraclavicular, and axillary nodes normal. Resp: clear to auscultation bilaterally Back: symmetric, no curvature. ROM normal. No CVA tenderness. Cardio: regular rate and rhythm, S1, S2 normal, no murmur, click, rub or gallop GI: soft, non-tender; bowel sounds normal; no masses,  no organomegaly Extremities: extremities normal, atraumatic, no cyanosis or edema  ECOG PERFORMANCE STATUS: 1 - Symptomatic but completely ambulatory  Blood pressure (!) 147/53, pulse 64, temperature 98.9 F (37.2 C), temperature source Temporal, resp. rate 17, height 6\' 3"  (1.905 m), weight 226 lb 4.8 oz (102.6 kg), SpO2 100 %.  LABORATORY  DATA: Lab Results  Component Value Date   WBC 8.3 03/10/2019   HGB 8.4 (L) 03/10/2019   HCT 26.2 (L) 03/10/2019   MCV 93.6 03/10/2019   PLT 170 03/10/2019      Chemistry      Component Value Date/Time   NA 134 (L) 03/10/2019 1032   NA 137 03/10/2017 0848   K 4.8 03/10/2019 1032   K 4.3 03/10/2017 0848   CL 108 03/10/2019 1032   CO2 16 (L) 03/10/2019 1032   CO2 23 03/10/2017 0848   BUN 85 (H) 03/10/2019 1032   BUN 79.1 (H) 03/10/2017 0848   CREATININE 4.35 (HH) 03/10/2019 1032   CREATININE 3.4 (HH) 03/10/2017 0848      Component Value Date/Time   CALCIUM 8.9 03/10/2019 1032   CALCIUM 9.6 03/10/2017 0848   ALKPHOS 64  03/10/2019 1032   ALKPHOS 64 03/10/2017 0848   AST 13 (L) 03/10/2019 1032   AST 13 03/10/2017 0848   ALT 15 03/10/2019 1032   ALT 16 03/10/2017 0848   BILITOT 0.3 03/10/2019 1032   BILITOT 0.38 03/10/2017 0848       RADIOGRAPHIC STUDIES: No results found.  ASSESSMENT AND PLAN:  This is a very pleasant 83 years old African-American male with history of chronic lymphocytic leukemia. The patient has been in observation with no concerning complaints.  The most recent blood work in August 2020 showed no concerning findings for disease progression. I recommended for the patient to continue on observation with repeat CBC and LDH in 1 year. For the chronic renal insufficiency, he is followed by Dr. Posey Pronto from nephrology. The patient was advised to call immediately if he has any concerning symptoms in the interval. The patient voices understanding of current disease status and treatment options and is in agreement with the current care plan. All questions were answered. The patient knows to call the clinic with any problems, questions or concerns. We can certainly see the patient much sooner if necessary. I spent 10 minutes counseling the patient face to face. The total time spent in the appointment was 15 minutes. Disclaimer: This note was dictated with voice  recognition software. Similar sounding words can inadvertently be transcribed and may not be corrected upon review.

## 2019-07-07 ENCOUNTER — Telehealth: Payer: Self-pay

## 2019-07-07 ENCOUNTER — Emergency Department (HOSPITAL_COMMUNITY): Payer: Medicare Other

## 2019-07-07 ENCOUNTER — Inpatient Hospital Stay (HOSPITAL_COMMUNITY)
Admission: EM | Admit: 2019-07-07 | Discharge: 2019-07-26 | DRG: 871 | Disposition: A | Discharge status: Home Health | Payer: Medicare Other | Attending: Internal Medicine | Admitting: Internal Medicine

## 2019-07-07 ENCOUNTER — Encounter (HOSPITAL_COMMUNITY): Payer: Self-pay | Admitting: Emergency Medicine

## 2019-07-07 DIAGNOSIS — J158 Pneumonia due to other specified bacteria: Secondary | ICD-10-CM | POA: Diagnosis present

## 2019-07-07 DIAGNOSIS — I160 Hypertensive urgency: Secondary | ICD-10-CM | POA: Diagnosis present

## 2019-07-07 DIAGNOSIS — N400 Enlarged prostate without lower urinary tract symptoms: Secondary | ICD-10-CM | POA: Diagnosis present

## 2019-07-07 DIAGNOSIS — A419 Sepsis, unspecified organism: Secondary | ICD-10-CM | POA: Diagnosis not present

## 2019-07-07 DIAGNOSIS — Z7952 Long term (current) use of systemic steroids: Secondary | ICD-10-CM

## 2019-07-07 DIAGNOSIS — Z9089 Acquired absence of other organs: Secondary | ICD-10-CM

## 2019-07-07 DIAGNOSIS — D631 Anemia in chronic kidney disease: Secondary | ICD-10-CM | POA: Diagnosis present

## 2019-07-07 DIAGNOSIS — Y712 Prosthetic and other implants, materials and accessory cardiovascular devices associated with adverse incidents: Secondary | ICD-10-CM | POA: Diagnosis not present

## 2019-07-07 DIAGNOSIS — I21A1 Myocardial infarction type 2: Secondary | ICD-10-CM | POA: Diagnosis present

## 2019-07-07 DIAGNOSIS — N3000 Acute cystitis without hematuria: Secondary | ICD-10-CM

## 2019-07-07 DIAGNOSIS — R0602 Shortness of breath: Secondary | ICD-10-CM | POA: Diagnosis present

## 2019-07-07 DIAGNOSIS — E875 Hyperkalemia: Secondary | ICD-10-CM | POA: Diagnosis present

## 2019-07-07 DIAGNOSIS — I214 Non-ST elevation (NSTEMI) myocardial infarction: Secondary | ICD-10-CM

## 2019-07-07 DIAGNOSIS — Z823 Family history of stroke: Secondary | ICD-10-CM

## 2019-07-07 DIAGNOSIS — R042 Hemoptysis: Secondary | ICD-10-CM | POA: Diagnosis not present

## 2019-07-07 DIAGNOSIS — A4181 Sepsis due to Enterococcus: Secondary | ICD-10-CM | POA: Diagnosis present

## 2019-07-07 DIAGNOSIS — Z20822 Contact with and (suspected) exposure to covid-19: Secondary | ICD-10-CM | POA: Diagnosis present

## 2019-07-07 DIAGNOSIS — Z6827 Body mass index (BMI) 27.0-27.9, adult: Secondary | ICD-10-CM

## 2019-07-07 DIAGNOSIS — I5043 Acute on chronic combined systolic (congestive) and diastolic (congestive) heart failure: Secondary | ICD-10-CM | POA: Diagnosis present

## 2019-07-07 DIAGNOSIS — N186 End stage renal disease: Secondary | ICD-10-CM | POA: Diagnosis present

## 2019-07-07 DIAGNOSIS — C9110 Chronic lymphocytic leukemia of B-cell type not having achieved remission: Secondary | ICD-10-CM | POA: Diagnosis not present

## 2019-07-07 DIAGNOSIS — L89152 Pressure ulcer of sacral region, stage 2: Secondary | ICD-10-CM | POA: Diagnosis present

## 2019-07-07 DIAGNOSIS — N185 Chronic kidney disease, stage 5: Secondary | ICD-10-CM | POA: Diagnosis not present

## 2019-07-07 DIAGNOSIS — Z86010 Personal history of colonic polyps: Secondary | ICD-10-CM

## 2019-07-07 DIAGNOSIS — N179 Acute kidney failure, unspecified: Secondary | ICD-10-CM | POA: Diagnosis present

## 2019-07-07 DIAGNOSIS — Z794 Long term (current) use of insulin: Secondary | ICD-10-CM

## 2019-07-07 DIAGNOSIS — E871 Hypo-osmolality and hyponatremia: Secondary | ICD-10-CM | POA: Diagnosis not present

## 2019-07-07 DIAGNOSIS — I499 Cardiac arrhythmia, unspecified: Secondary | ICD-10-CM | POA: Diagnosis not present

## 2019-07-07 DIAGNOSIS — D62 Acute posthemorrhagic anemia: Secondary | ICD-10-CM | POA: Diagnosis not present

## 2019-07-07 DIAGNOSIS — Z833 Family history of diabetes mellitus: Secondary | ICD-10-CM

## 2019-07-07 DIAGNOSIS — I959 Hypotension, unspecified: Secondary | ICD-10-CM | POA: Diagnosis not present

## 2019-07-07 DIAGNOSIS — R9431 Abnormal electrocardiogram [ECG] [EKG]: Secondary | ICD-10-CM | POA: Diagnosis present

## 2019-07-07 DIAGNOSIS — R791 Abnormal coagulation profile: Secondary | ICD-10-CM | POA: Diagnosis not present

## 2019-07-07 DIAGNOSIS — I132 Hypertensive heart and chronic kidney disease with heart failure and with stage 5 chronic kidney disease, or end stage renal disease: Secondary | ICD-10-CM | POA: Diagnosis present

## 2019-07-07 DIAGNOSIS — Z4901 Encounter for fitting and adjustment of extracorporeal dialysis catheter: Secondary | ICD-10-CM | POA: Diagnosis not present

## 2019-07-07 DIAGNOSIS — B9620 Unspecified Escherichia coli [E. coli] as the cause of diseases classified elsewhere: Secondary | ICD-10-CM | POA: Diagnosis present

## 2019-07-07 DIAGNOSIS — N184 Chronic kidney disease, stage 4 (severe): Secondary | ICD-10-CM | POA: Diagnosis not present

## 2019-07-07 DIAGNOSIS — N390 Urinary tract infection, site not specified: Secondary | ICD-10-CM | POA: Diagnosis present

## 2019-07-07 DIAGNOSIS — E872 Acidosis: Secondary | ICD-10-CM | POA: Diagnosis present

## 2019-07-07 DIAGNOSIS — C9111 Chronic lymphocytic leukemia of B-cell type in remission: Secondary | ICD-10-CM | POA: Diagnosis present

## 2019-07-07 DIAGNOSIS — E1122 Type 2 diabetes mellitus with diabetic chronic kidney disease: Secondary | ICD-10-CM | POA: Diagnosis present

## 2019-07-07 DIAGNOSIS — R778 Other specified abnormalities of plasma proteins: Secondary | ICD-10-CM | POA: Diagnosis not present

## 2019-07-07 DIAGNOSIS — R7989 Other specified abnormal findings of blood chemistry: Secondary | ICD-10-CM | POA: Diagnosis present

## 2019-07-07 DIAGNOSIS — R410 Disorientation, unspecified: Secondary | ICD-10-CM | POA: Diagnosis not present

## 2019-07-07 DIAGNOSIS — Y92230 Patient room in hospital as the place of occurrence of the external cause: Secondary | ICD-10-CM | POA: Diagnosis not present

## 2019-07-07 DIAGNOSIS — Z87891 Personal history of nicotine dependence: Secondary | ICD-10-CM

## 2019-07-07 DIAGNOSIS — I480 Paroxysmal atrial fibrillation: Secondary | ICD-10-CM | POA: Diagnosis not present

## 2019-07-07 DIAGNOSIS — I25110 Atherosclerotic heart disease of native coronary artery with unstable angina pectoris: Secondary | ICD-10-CM | POA: Diagnosis present

## 2019-07-07 DIAGNOSIS — L8990 Pressure ulcer of unspecified site, unspecified stage: Secondary | ICD-10-CM | POA: Insufficient documentation

## 2019-07-07 DIAGNOSIS — Z743 Need for continuous supervision: Secondary | ICD-10-CM | POA: Diagnosis not present

## 2019-07-07 DIAGNOSIS — R404 Transient alteration of awareness: Secondary | ICD-10-CM | POA: Diagnosis not present

## 2019-07-07 DIAGNOSIS — R0981 Nasal congestion: Secondary | ICD-10-CM | POA: Diagnosis not present

## 2019-07-07 DIAGNOSIS — D649 Anemia, unspecified: Secondary | ICD-10-CM | POA: Diagnosis not present

## 2019-07-07 DIAGNOSIS — D509 Iron deficiency anemia, unspecified: Secondary | ICD-10-CM | POA: Diagnosis present

## 2019-07-07 DIAGNOSIS — R279 Unspecified lack of coordination: Secondary | ICD-10-CM | POA: Diagnosis not present

## 2019-07-07 DIAGNOSIS — Z7982 Long term (current) use of aspirin: Secondary | ICD-10-CM

## 2019-07-07 DIAGNOSIS — D638 Anemia in other chronic diseases classified elsewhere: Secondary | ICD-10-CM

## 2019-07-07 DIAGNOSIS — I25118 Atherosclerotic heart disease of native coronary artery with other forms of angina pectoris: Secondary | ICD-10-CM

## 2019-07-07 DIAGNOSIS — Z955 Presence of coronary angioplasty implant and graft: Secondary | ICD-10-CM

## 2019-07-07 DIAGNOSIS — Z8619 Personal history of other infectious and parasitic diseases: Secondary | ICD-10-CM

## 2019-07-07 DIAGNOSIS — J189 Pneumonia, unspecified organism: Secondary | ICD-10-CM | POA: Diagnosis not present

## 2019-07-07 DIAGNOSIS — T82510A Breakdown (mechanical) of surgically created arteriovenous fistula, initial encounter: Secondary | ICD-10-CM | POA: Diagnosis not present

## 2019-07-07 DIAGNOSIS — E8770 Fluid overload, unspecified: Secondary | ICD-10-CM | POA: Diagnosis not present

## 2019-07-07 DIAGNOSIS — Z8249 Family history of ischemic heart disease and other diseases of the circulatory system: Secondary | ICD-10-CM

## 2019-07-07 DIAGNOSIS — R609 Edema, unspecified: Secondary | ICD-10-CM | POA: Diagnosis not present

## 2019-07-07 DIAGNOSIS — E669 Obesity, unspecified: Secondary | ICD-10-CM | POA: Diagnosis present

## 2019-07-07 DIAGNOSIS — Z992 Dependence on renal dialysis: Secondary | ICD-10-CM

## 2019-07-07 DIAGNOSIS — Z66 Do not resuscitate: Secondary | ICD-10-CM | POA: Diagnosis not present

## 2019-07-07 DIAGNOSIS — N189 Chronic kidney disease, unspecified: Secondary | ICD-10-CM

## 2019-07-07 DIAGNOSIS — E119 Type 2 diabetes mellitus without complications: Secondary | ICD-10-CM

## 2019-07-07 DIAGNOSIS — E785 Hyperlipidemia, unspecified: Secondary | ICD-10-CM | POA: Diagnosis present

## 2019-07-07 DIAGNOSIS — I120 Hypertensive chronic kidney disease with stage 5 chronic kidney disease or end stage renal disease: Secondary | ICD-10-CM | POA: Diagnosis not present

## 2019-07-07 DIAGNOSIS — I10 Essential (primary) hypertension: Secondary | ICD-10-CM | POA: Diagnosis not present

## 2019-07-07 DIAGNOSIS — M25462 Effusion, left knee: Secondary | ICD-10-CM | POA: Diagnosis not present

## 2019-07-07 DIAGNOSIS — R0902 Hypoxemia: Secondary | ICD-10-CM | POA: Diagnosis not present

## 2019-07-07 DIAGNOSIS — Z79899 Other long term (current) drug therapy: Secondary | ICD-10-CM

## 2019-07-07 LAB — URINALYSIS, ROUTINE W REFLEX MICROSCOPIC
Bilirubin Urine: NEGATIVE
Glucose, UA: NEGATIVE mg/dL
Hgb urine dipstick: NEGATIVE
Ketones, ur: NEGATIVE mg/dL
Nitrite: NEGATIVE
Protein, ur: 100 mg/dL — AB
Specific Gravity, Urine: 1.014 (ref 1.005–1.030)
pH: 5 (ref 5.0–8.0)

## 2019-07-07 LAB — RESPIRATORY PANEL BY RT PCR (FLU A&B, COVID)
Influenza A by PCR: NEGATIVE
Influenza B by PCR: NEGATIVE
SARS Coronavirus 2 by RT PCR: NEGATIVE

## 2019-07-07 LAB — COMPREHENSIVE METABOLIC PANEL
ALT: 14 U/L (ref 0–44)
AST: 13 U/L — ABNORMAL LOW (ref 15–41)
Albumin: 3.5 g/dL (ref 3.5–5.0)
Alkaline Phosphatase: 61 U/L (ref 38–126)
Anion gap: 12 (ref 5–15)
BUN: 98 mg/dL — ABNORMAL HIGH (ref 8–23)
CO2: 15 mmol/L — ABNORMAL LOW (ref 22–32)
Calcium: 9.2 mg/dL (ref 8.9–10.3)
Chloride: 111 mmol/L (ref 98–111)
Creatinine, Ser: 6.61 mg/dL — ABNORMAL HIGH (ref 0.61–1.24)
GFR calc Af Amer: 8 mL/min — ABNORMAL LOW (ref >60)
GFR calc non Af Amer: 7 mL/min — ABNORMAL LOW (ref >60)
Glucose, Bld: 97 mg/dL (ref 70–99)
Potassium: 4.9 mmol/L (ref 3.5–5.1)
Sodium: 138 mmol/L (ref 135–145)
Total Bilirubin: 0.6 mg/dL (ref 0.3–1.2)
Total Protein: 6.4 g/dL — ABNORMAL LOW (ref 6.5–8.1)

## 2019-07-07 LAB — CBC WITH DIFFERENTIAL/PLATELET
Abs Immature Granulocytes: 0.07 10*3/uL (ref 0.00–0.07)
Basophils Absolute: 0 10*3/uL (ref 0.0–0.1)
Basophils Relative: 0 %
Eosinophils Absolute: 0.2 10*3/uL (ref 0.0–0.5)
Eosinophils Relative: 1 %
HCT: 26.2 % — ABNORMAL LOW (ref 39.0–52.0)
Hemoglobin: 8.5 g/dL — ABNORMAL LOW (ref 13.0–17.0)
Immature Granulocytes: 0 %
Lymphocytes Relative: 22 %
Lymphs Abs: 3.6 10*3/uL (ref 0.7–4.0)
MCH: 32.2 pg (ref 26.0–34.0)
MCHC: 32.4 g/dL (ref 30.0–36.0)
MCV: 99.2 fL (ref 80.0–100.0)
Monocytes Absolute: 1.5 10*3/uL — ABNORMAL HIGH (ref 0.1–1.0)
Monocytes Relative: 9 %
Neutro Abs: 11.3 10*3/uL — ABNORMAL HIGH (ref 1.7–7.7)
Neutrophils Relative %: 68 %
Platelets: 248 10*3/uL (ref 150–400)
RBC: 2.64 MIL/uL — ABNORMAL LOW (ref 4.22–5.81)
RDW: 13.4 % (ref 11.5–15.5)
WBC: 16.8 10*3/uL — ABNORMAL HIGH (ref 4.0–10.5)
nRBC: 0 % (ref 0.0–0.2)

## 2019-07-07 LAB — FERRITIN: Ferritin: 420 ng/mL — ABNORMAL HIGH (ref 24–336)

## 2019-07-07 LAB — LACTIC ACID, PLASMA
Lactic Acid, Venous: 1 mmol/L (ref 0.5–1.9)
Lactic Acid, Venous: 1.1 mmol/L (ref 0.5–1.9)

## 2019-07-07 LAB — CBG MONITORING, ED: Glucose-Capillary: 87 mg/dL (ref 70–99)

## 2019-07-07 LAB — GLUCOSE, CAPILLARY
Glucose-Capillary: 120 mg/dL — ABNORMAL HIGH (ref 70–99)
Glucose-Capillary: 131 mg/dL — ABNORMAL HIGH (ref 70–99)

## 2019-07-07 LAB — TROPONIN I (HIGH SENSITIVITY)
Troponin I (High Sensitivity): 32 ng/L — ABNORMAL HIGH (ref <18)
Troponin I (High Sensitivity): 64 ng/L — ABNORMAL HIGH (ref <18)
Troponin I (High Sensitivity): 101 ng/L
Troponin I (High Sensitivity): 124 ng/L

## 2019-07-07 LAB — FIBRINOGEN: Fibrinogen: 613 mg/dL — ABNORMAL HIGH (ref 210–475)

## 2019-07-07 LAB — TRIGLYCERIDES: Triglycerides: 71 mg/dL (ref <150)

## 2019-07-07 LAB — BRAIN NATRIURETIC PEPTIDE: B Natriuretic Peptide: 360.3 pg/mL — ABNORMAL HIGH (ref 0.0–100.0)

## 2019-07-07 LAB — HIV ANTIBODY (ROUTINE TESTING W REFLEX): HIV Screen 4th Generation wRfx: NONREACTIVE

## 2019-07-07 LAB — HEMOGLOBIN A1C
Hgb A1c MFr Bld: 5.4 % (ref 4.8–5.6)
Mean Plasma Glucose: 108.28 mg/dL

## 2019-07-07 LAB — D-DIMER, QUANTITATIVE: D-Dimer, Quant: 1.46 ug/mL-FEU — ABNORMAL HIGH (ref 0.00–0.50)

## 2019-07-07 LAB — HEPARIN LEVEL (UNFRACTIONATED): Heparin Unfractionated: 0.27 IU/mL — ABNORMAL LOW (ref 0.30–0.70)

## 2019-07-07 LAB — PROCALCITONIN: Procalcitonin: 0.13 ng/mL

## 2019-07-07 LAB — POC SARS CORONAVIRUS 2 AG -  ED: SARS Coronavirus 2 Ag: NEGATIVE

## 2019-07-07 LAB — LACTATE DEHYDROGENASE: LDH: 184 U/L (ref 98–192)

## 2019-07-07 LAB — STREP PNEUMONIAE URINARY ANTIGEN: Strep Pneumo Urinary Antigen: NEGATIVE

## 2019-07-07 LAB — C-REACTIVE PROTEIN: CRP: <0.5 mg/dL (ref <1.0)

## 2019-07-07 MED ORDER — ONDANSETRON HCL 4 MG/2ML IJ SOLN: 4.0000 mg | Freq: Four times a day (QID) | INTRAMUSCULAR | Status: DC | PRN

## 2019-07-07 MED ORDER — INSULIN ASPART 100 UNIT/ML ~~LOC~~ SOLN
0.0000 [IU] | Freq: Three times a day (TID) | SUBCUTANEOUS | Status: DC
  Administered 2019-07-08 (×3): 1 [IU] via SUBCUTANEOUS
  Administered 2019-07-09: 3 [IU] via SUBCUTANEOUS
  Administered 2019-07-09: 2 [IU] via SUBCUTANEOUS
  Administered 2019-07-10 (×2): 3 [IU] via SUBCUTANEOUS
  Administered 2019-07-10 – 2019-07-13 (×8): 2 [IU] via SUBCUTANEOUS
  Administered 2019-07-13: 1 [IU] via SUBCUTANEOUS
  Administered 2019-07-13: 2 [IU] via SUBCUTANEOUS
  Administered 2019-07-14 (×2): 1 [IU] via SUBCUTANEOUS
  Administered 2019-07-14: 2 [IU] via SUBCUTANEOUS
  Administered 2019-07-15: 1 [IU] via SUBCUTANEOUS
  Administered 2019-07-16: 3 [IU] via SUBCUTANEOUS
  Administered 2019-07-16 – 2019-07-17 (×4): 2 [IU] via SUBCUTANEOUS
  Administered 2019-07-18: 3 [IU] via SUBCUTANEOUS
  Administered 2019-07-18: 2 [IU] via SUBCUTANEOUS
  Administered 2019-07-19 (×3): 3 [IU] via SUBCUTANEOUS
  Administered 2019-07-20: 2 [IU] via SUBCUTANEOUS
  Administered 2019-07-20: 1 [IU] via SUBCUTANEOUS
  Administered 2019-07-20: 3 [IU] via SUBCUTANEOUS
  Administered 2019-07-21: 2 [IU] via SUBCUTANEOUS
  Administered 2019-07-21: 3 [IU] via SUBCUTANEOUS
  Administered 2019-07-21 – 2019-07-22 (×3): 2 [IU] via SUBCUTANEOUS
  Administered 2019-07-22: 18:00:00 3 [IU] via SUBCUTANEOUS
  Administered 2019-07-23 – 2019-07-24 (×3): 2 [IU] via SUBCUTANEOUS
  Administered 2019-07-24 – 2019-07-26 (×3): 1 [IU] via SUBCUTANEOUS

## 2019-07-07 MED ORDER — HEPARIN BOLUS VIA INFUSION
4000.0000 [IU] | Freq: Once | INTRAVENOUS | Status: AC
  Administered 2019-07-07: 4000 [IU] via INTRAVENOUS
  Filled 2019-07-07: qty 4000

## 2019-07-07 MED ORDER — SODIUM CHLORIDE 0.9 % IV SOLN
1.0000 g | Freq: Once | INTRAVENOUS | Status: AC
  Administered 2019-07-07: 1 g via INTRAVENOUS
  Filled 2019-07-07: qty 10

## 2019-07-07 MED ORDER — NITROGLYCERIN 0.4 MG SL SUBL
SUBLINGUAL_TABLET | SUBLINGUAL | Status: AC
  Administered 2019-07-07: 0.4 mg
  Filled 2019-07-07: qty 3

## 2019-07-07 MED ORDER — AZITHROMYCIN 500 MG PO TABS
500.0000 mg | ORAL_TABLET | Freq: Every day | ORAL | Status: AC
  Administered 2019-07-08 – 2019-07-11 (×4): 500 mg via ORAL
  Filled 2019-07-07 (×4): qty 1

## 2019-07-07 MED ORDER — ASPIRIN EC 81 MG PO TBEC
81.0000 mg | DELAYED_RELEASE_TABLET | Freq: Every day | ORAL | Status: DC
  Administered 2019-07-07 – 2019-07-26 (×18): 81 mg via ORAL
  Filled 2019-07-07 (×18): qty 1

## 2019-07-07 MED ORDER — ONDANSETRON HCL 4 MG PO TABS: 4.0000 mg | ORAL_TABLET | Freq: Four times a day (QID) | ORAL | Status: DC | PRN

## 2019-07-07 MED ORDER — FUROSEMIDE 10 MG/ML IJ SOLN: 40.0000 mg | Freq: Two times a day (BID) | INTRAMUSCULAR | Status: DC

## 2019-07-07 MED ORDER — SODIUM CHLORIDE 0.9 % IV SOLN
2.0000 g | INTRAVENOUS | Status: DC
  Administered 2019-07-08 – 2019-07-11 (×4): 2 g via INTRAVENOUS
  Filled 2019-07-07: qty 2
  Filled 2019-07-07: qty 20
  Filled 2019-07-07 (×3): qty 2

## 2019-07-07 MED ORDER — CARVEDILOL 25 MG PO TABS
25.0000 mg | ORAL_TABLET | Freq: Two times a day (BID) | ORAL | Status: DC
  Administered 2019-07-07 – 2019-07-10 (×7): 25 mg via ORAL
  Filled 2019-07-07 (×7): qty 1

## 2019-07-07 MED ORDER — ACETAMINOPHEN 650 MG RE SUPP: 650.0000 mg | Freq: Four times a day (QID) | RECTAL | Status: DC | PRN

## 2019-07-07 MED ORDER — FUROSEMIDE 10 MG/ML IJ SOLN
40.0000 mg | Freq: Two times a day (BID) | INTRAMUSCULAR | Status: DC
  Administered 2019-07-07: 40 mg via INTRAVENOUS
  Filled 2019-07-07: qty 4

## 2019-07-07 MED ORDER — RANOLAZINE ER 500 MG PO TB12
1000.0000 mg | ORAL_TABLET | Freq: Two times a day (BID) | ORAL | Status: DC
  Administered 2019-07-07 – 2019-07-26 (×35): 1000 mg via ORAL
  Filled 2019-07-07 (×36): qty 2

## 2019-07-07 MED ORDER — CLONIDINE HCL 0.2 MG PO TABS
0.2000 mg | ORAL_TABLET | Freq: Two times a day (BID) | ORAL | Status: DC
  Administered 2019-07-07 – 2019-07-10 (×7): 0.2 mg via ORAL
  Filled 2019-07-07 (×7): qty 1

## 2019-07-07 MED ORDER — DIPHENHYDRAMINE-APAP (SLEEP) 25-500 MG PO TABS: 2.0000 | ORAL_TABLET | Freq: Every day | ORAL | Status: DC

## 2019-07-07 MED ORDER — SODIUM CHLORIDE 0.9 % IV SOLN
500.0000 mg | Freq: Once | INTRAVENOUS | Status: AC
  Administered 2019-07-07: 500 mg via INTRAVENOUS
  Filled 2019-07-07: qty 500

## 2019-07-07 MED ORDER — FUROSEMIDE 10 MG/ML IJ SOLN
80.0000 mg | Freq: Three times a day (TID) | INTRAMUSCULAR | Status: AC
  Administered 2019-07-07 – 2019-07-08 (×3): 80 mg via INTRAVENOUS
  Filled 2019-07-07 (×3): qty 8

## 2019-07-07 MED ORDER — ISOSORBIDE DINITRATE 30 MG PO TABS
30.0000 mg | ORAL_TABLET | Freq: Three times a day (TID) | ORAL | Status: DC
  Administered 2019-07-07 – 2019-07-10 (×10): 30 mg via ORAL
  Filled 2019-07-07 (×12): qty 1

## 2019-07-07 MED ORDER — HYDRALAZINE HCL 50 MG PO TABS
100.0000 mg | ORAL_TABLET | Freq: Three times a day (TID) | ORAL | Status: DC
  Administered 2019-07-07 – 2019-07-10 (×11): 100 mg via ORAL
  Filled 2019-07-07 (×12): qty 2

## 2019-07-07 MED ORDER — HEPARIN (PORCINE) 25000 UT/250ML-% IV SOLN
1450.0000 [IU]/h | INTRAVENOUS | Status: DC
  Administered 2019-07-07: 1250 [IU]/h via INTRAVENOUS
  Administered 2019-07-08: 1450 [IU]/h via INTRAVENOUS
  Filled 2019-07-07 (×2): qty 250

## 2019-07-07 MED ORDER — ACETAMINOPHEN 325 MG PO TABS
650.0000 mg | ORAL_TABLET | Freq: Four times a day (QID) | ORAL | Status: DC | PRN
  Administered 2019-07-09 – 2019-07-24 (×8): 650 mg via ORAL
  Filled 2019-07-07 (×8): qty 2

## 2019-07-07 MED ORDER — AMLODIPINE BESYLATE 5 MG PO TABS
5.0000 mg | ORAL_TABLET | Freq: Every day | ORAL | Status: DC
  Administered 2019-07-07 – 2019-07-10 (×4): 5 mg via ORAL
  Filled 2019-07-07 (×4): qty 1

## 2019-07-07 MED ORDER — HEPARIN SODIUM (PORCINE) 5000 UNIT/ML IJ SOLN: 5000.0000 [IU] | Freq: Three times a day (TID) | INTRAMUSCULAR | Status: DC

## 2019-07-07 MED ORDER — ATORVASTATIN CALCIUM 10 MG PO TABS
20.0000 mg | ORAL_TABLET | Freq: Every day | ORAL | Status: DC
  Administered 2019-07-07 – 2019-07-26 (×17): 20 mg via ORAL
  Filled 2019-07-07 (×17): qty 2

## 2019-07-07 NOTE — Progress Notes (Signed)
ANTICOAGULATION CONSULT NOTE - Follow Up Consult  Pharmacy Consult for Heparin IV Indication: chest pain/ACS  No Known Allergies  Patient Measurements: Height: 6\' 3"  (190.5 cm) IBW/kg (Calculated) : 84.5 Heparin Dosing Weight: 102.6 kg  Vital Signs: Temp: 98.7 F (37.1 C) (12/17 1846) Temp Source: Oral (12/17 1846) BP: 189/68 (12/17 1846) Pulse Rate: 75 (12/17 1846)  Labs: Recent Labs    07/07/19 1057 07/07/19 1344 07/07/19 1935  HGB 8.5*  --   --   HCT 26.2*  --   --   PLT 248  --   --   HEPARINUNFRC  --   --  0.27*  CREATININE 6.61*  --   --   TROPONINIHS 32* 64*  --     Estimated Creatinine Clearance: 11.4 mL/min (A) (by C-G formula based on SCr of 6.61 mg/dL (H)).   Medications:  Infusions:  . [START ON 07/08/2019] cefTRIAXone (ROCEPHIN)  IV    . heparin 1,250 Units/hr (07/07/19 1227)    Assessment: 83 year old male presented with blood tinged sputum and bilateral LLE edema. EKG suggestive of ischemia. No anticoagulation prior to admission. Pharmacy was consulted for IV Heparin therapy for ACS.   Hgb 8.5 on admission. Platelets within normal limits. No other signs of bleeding noted. Heparin was initiated this AM.   The initial heparin level is slightly low at 0.27 on current rate of 1250 units/hr. Per RN and patient, no further bloody sputum and no other signs of bleeding. No issues with infusion.    Goal of Therapy:  Heparin level 0.3-0.7 units/ml Monitor platelets by anticoagulation protocol: Yes   Plan:  Increase heparin to 1450 units/hr.  Recheck heparin level in 8 hours - ok with AM labs.   Sloan Leiter, PharmD, BCPS, BCCCP Clinical Pharmacist Please refer to [facility] for [place] numbers 07/07/2019,8:08 PM

## 2019-07-07 NOTE — Telephone Encounter (Signed)
Very difficult situation for now, Hospital is so full and they really do not want anyone to come.  I will be seeing him this evening and will decide if he is very ill, then  I can contact hospital aurthorities

## 2019-07-07 NOTE — Telephone Encounter (Signed)
Mellanie (daughter) who has fathers healthcare Proxy was told by hospital that if you give approval for her to visit father they will allow it. Daughter would like you to please contact hospital and have it nottated in fathers chart that she is allowed to visit.

## 2019-07-07 NOTE — ED Triage Notes (Addendum)
Patient here from home by [facility] Reports coughing up blood tinged sputum X 1 hour Bilateral lower leg edema Hx of CKD-V Says he was told that he has CHF Fistula place on left arm 57mos ago  Alert on arrival

## 2019-07-07 NOTE — ED Notes (Signed)
ED TO INPATIENT HANDOFF REPORT  ED Nurse Name and Phone #: Bayleigh Loflin [phone]  S Name/Age/Gender Ray Merritt 83 y.o. male Room/Bed: 034C/034C  Code Status   Code Status: Full Code  Home/SNF/Other Home Patient oriented to: self, place, time and situation Is this baseline? Yes   Triage Complete: Triage complete  Chief Complaint SOB (shortness of breath) [R06.02] Sepsis ([place]) [A41.9]  Triage Note Patient here from home by [facility] Reports coughing up blood tinged sputum X 1 hour Bilateral lower leg edema Hx of CKD-V Says he was told that he has CHF Fistula place on left arm 35mo ago  Alert on arrival     Allergies No Known Allergies  Level of Care/Admitting Diagnosis ED Disposition    ED Disposition Condition [facility][100100]  Level of Care: Progressive [102]  Admit to Progressive based on following criteria: CARDIOVASCULAR & THORACIC of moderate stability with acute coronary syndrome symptoms/low risk myocardial infarction/hypertensive urgency/arrhythmias/heart failure potentially compromising stability and stable post cardiovascular intervention patients.  Admit to Progressive based on following criteria: RESPIRATORY PROBLEMS hypoxemic/hypercapnic respiratory failure that is responsive to NIPPV (BiPAP) or High Flow Nasal Cannula (6-80 lpm). Frequent assessment/intervention, no > Q2 hrs < Q4 hrs, to maintain oxygenation and pulmonary hygiene.  Covid Evaluation: Confirmed COVID Negative  Diagnosis: Sepsis ([facility]) [[id number]] Admitting Physician: SNorval Morton[[[id number] Attending Physician: SNorval Morton[[[id number]] Estimated length of stay: past midnight tomorrow  Certification:: I certify this patient will need inpatient services for at least 2 midnights       B Medical/Surgery History Past Medical History:  Diagnosis Date  . Anemia   . Arthritis   . Cancer ([facility] 2015-present   CLL  . Chronic kidney disease    . Coronary artery disease 04/2016   1 stent  . Diabetes mellitus    insulin dependent   . Enlarged prostate   . Headache   . Hypertension   . Pneumonia    Past Surgical History:  Procedure Laterality Date  . AV FISTULA PLACEMENT Left 04/28/2017   Procedure: Left arm ARTERIOVENOUS (AV) FISTULA CREATION;  Surgeon: DAngelia Mould MD;  [facility]  Service: Vascular;  Laterality: Left;  . CARDIAC CATHETERIZATION N/A 05/01/2015   Procedure: Left Heart Cath and Coronary Angiography;  Surgeon: JAdrian Prows MD;  [facility];  Service: Cardiovascular;  Laterality: N/A;  . CARDIAC CATHETERIZATION  05/01/2015   Procedure: Intravascular Pressure Wire/FFR Study;  Surgeon: JAdrian Prows MD;  [facility];  Service: Cardiovascular;;  . CARDIAC CATHETERIZATION N/A 05/22/2015   Procedure: Left Heart Cath and Coronary Angiography;  Surgeon: JAdrian Prows MD;  [facility];  Service: Cardiovascular;  Laterality: N/A;  . CARDIAC CATHETERIZATION N/A 05/22/2015   Procedure: Coronary Stent Intervention;  Surgeon: JAdrian Prows MD;  [facility];  Service: Cardiovascular;  Laterality: N/A;  . COLONOSCOPY W/ BIOPSIES AND POLYPECTOMY    . SHOULDER SURGERY    . TONSILLECTOMY       A IV Location/Drains/Wounds Patient Lines/Drains/Airways Status   Active Line/Drains/Airways    Name:   Placement date:   Placement time:   Site:   Days:   Peripheral IV 05/12/18 Right Antecubital   05/12/18    1155    Antecubital   421   Peripheral IV 07/07/19 Right Antecubital   07/07/19    1034    Antecubital  less than 1   Fistula / Graft Left Forearm Arteriovenous fistula   04/28/17    0814    Forearm   800   Incision (Closed) 04/28/17 Arm Left   04/28/17    0804     800          Intake/Output Last 24 hours No intake or output data in the 24 hours ending 07/07/19 1756  Labs/Imaging Results for orders placed or performed during the hospital encounter  of 07/07/19 (from the past 48 hour(s))  CBG monitoring, ED     Status: None   Collection Time: 07/07/19 10:50 AM  Result Value Ref Range   Glucose-Capillary 87 70 - 99 mg/dL  Lactic acid, plasma     Status: None   Collection Time: 07/07/19 10:57 AM  Result Value Ref Range   Lactic Acid, Venous 1.0 0.5 - 1.9 mmol/L    Comment: Performed at [facility] [address], [place], [place] [address]  CBC WITH DIFFERENTIAL     Status: Abnormal   Collection Time: 07/07/19 10:57 AM  Result Value Ref Range   WBC 16.8 (H) 4.0 - 10.5 K/uL   RBC 2.64 (L) 4.22 - 5.81 MIL/uL   Hemoglobin 8.5 (L) 13.0 - 17.0 g/dL   HCT 26.2 (L) 39.0 - 52.0 %   MCV 99.2 80.0 - 100.0 fL   MCH 32.2 26.0 - 34.0 pg   MCHC 32.4 30.0 - 36.0 g/dL   RDW 13.4 11.5 - 15.5 %   Platelets 248 150 - 400 K/uL   nRBC 0.0 0.0 - 0.2 %   Neutrophils Relative % 68 %   Neutro Abs 11.3 (H) 1.7 - 7.7 K/uL   Lymphocytes Relative 22 %   Lymphs Abs 3.6 0.7 - 4.0 K/uL   Monocytes Relative 9 %   Monocytes Absolute 1.5 (H) 0.1 - 1.0 K/uL   Eosinophils Relative 1 %   Eosinophils Absolute 0.2 0.0 - 0.5 K/uL   Basophils Relative 0 %   Basophils Absolute 0.0 0.0 - 0.1 K/uL   Immature Granulocytes 0 %   Abs Immature Granulocytes 0.07 0.00 - 0.07 K/uL    Comment: Performed at [facility] [place] [facility], [address] [place], [place] [address]  Comprehensive metabolic panel     Status: Abnormal   Collection Time: 07/07/19 10:57 AM  Result Value Ref Range   Sodium 138 135 - 145 mmol/L   Potassium 4.9 3.5 - 5.1 mmol/L   Chloride 111 98 - 111 mmol/L   CO2 15 (L) 22 - 32 mmol/L   Glucose, Bld 97 70 - 99 mg/dL   BUN 98 (H) 8 - 23 mg/dL   Creatinine, Ser 6.61 (H) 0.61 - 1.24 mg/dL   Calcium 9.2 8.9 - 10.3 mg/dL   Total Protein 6.4 (L) 6.5 - 8.1 g/dL   Albumin 3.5 3.5 - 5.0 g/dL   AST 13 (L) 15 - 41 U/L   ALT 14 0 - 44 U/L   Alkaline Phosphatase 61 38 - 126 U/L   Total Bilirubin 0.6 0.3 - 1.2 mg/dL   GFR calc non Af Amer 7 (L) >60 mL/min    GFR calc Af Amer 8 (L) >60 mL/min   Anion gap 12 5 - 15    Comment: Performed at [facility], [place] [address] [place], [place] [address]  D-dimer, quantitative     Status: Abnormal   Collection Time: 07/07/19 10:57 AM  Result Value Ref Range  D-Dimer, Quant 1.46 (H) 0.00 - 0.50 ug/mL-FEU    Comment: (NOTE) At the manufacturer cut-off of 0.50 ug/mL FEU, this assay has been documented to exclude PE with a sensitivity and negative predictive value of 97 to 99%.  At this time, this assay has not been approved by the FDA to exclude DVT/VTE. Results should be correlated with clinical presentation. Performed at [facility], [place] [address] [place], [place] [address]   Procalcitonin     Status: None   Collection Time: 07/07/19 10:57 AM  Result Value Ref Range   Procalcitonin 0.13 ng/mL    Comment:        Interpretation: PCT (Procalcitonin) <= 0.5 ng/mL: Systemic infection (sepsis) is not likely. Local bacterial infection is possible. (NOTE)       Sepsis PCT Algorithm           Lower Respiratory Tract                                      Infection PCT Algorithm    ----------------------------     ----------------------------         PCT < 0.25 ng/mL                PCT < 0.10 ng/mL         Strongly encourage             Strongly discourage   discontinuation of antibiotics    initiation of antibiotics    ----------------------------     -----------------------------       PCT 0.25 - 0.50 ng/mL            PCT 0.10 - 0.25 ng/mL               OR       >80% decrease in PCT            Discourage initiation of                                            antibiotics      Encourage discontinuation           of antibiotics    ----------------------------     -----------------------------         PCT >= 0.50 ng/mL              PCT 0.26 - 0.50 ng/mL               AND        <80% decrease in PCT             Encourage initiation of                                              antibiotics       Encourage continuation           of antibiotics    ----------------------------     -----------------------------        PCT >= 0.50 ng/mL                  PCT > 0.50 ng/mL  AND         increase in PCT                  Strongly encourage                                      initiation of antibiotics    Strongly encourage escalation           of antibiotics                                     -----------------------------                                           PCT <= 0.25 ng/mL                                                 OR                                        > 80% decrease in PCT                                     Discontinue / Do not initiate                                             antibiotics Performed at [facility], [address], [place], [place] [address]   Lactate dehydrogenase     Status: None   Collection Time: 07/07/19 10:57 AM  Result Value Ref Range   LDH 184 98 - 192 U/L    Comment: Performed at [facility], [place] [address], [place], [place] [address]  Ferritin     Status: Abnormal   Collection Time: 07/07/19 10:57 AM  Result Value Ref Range   Ferritin 420 (H) 24 - 336 ng/mL    Comment: Performed at [facility] [address] [place], [place] [address]  Triglycerides     Status: None   Collection Time: 07/07/19 10:57 AM  Result Value Ref Range   Triglycerides 71 <150 mg/dL    Comment: Performed at [facility] [address] [place], [place] [address]  Fibrinogen     Status: Abnormal   Collection Time: 07/07/19 10:57 AM  Result Value Ref Range   Fibrinogen 613 (H) 210 - 475 mg/dL    Comment: Performed at [facility] [address] [place], [place] [address]  C-reactive protein     Status: None   Collection Time: 07/07/19 10:57 AM  Result Value Ref Range   CRP <0.5 <1.0 mg/dL    Comment: Performed at [facility], [place] [address], [place], [place] [address]  Brain  natriuretic peptide     Status:  Abnormal   Collection Time: 07/07/19 10:57 AM  Result Value Ref Range   B Natriuretic Peptide 360.3 (H) 0.0 - 100.0 pg/mL    Comment: Performed at [facility] [address] [place], [place] [address]  Troponin I (High Sensitivity)     Status: Abnormal   Collection Time: 07/07/19 10:57 AM  Result Value Ref Range   Troponin I (High Sensitivity) 32 (H) <18 ng/L    Comment: (NOTE) Elevated high sensitivity troponin I (hsTnI) values and significant  changes across serial measurements may suggest ACS but many other  chronic and acute conditions are known to elevate hsTnI results.  Refer to the "Links" section for chest pain algorithms and additional  guidance. Performed at [facility], [place] [address], [place], [place] [address]   POC SARS Coronavirus 2 Ag-ED - Nasal Swab (BD Veritor Kit)     Status: None   Collection Time: 07/07/19 12:20 PM  Result Value Ref Range   SARS Coronavirus 2 Ag NEGATIVE NEGATIVE    Comment: (NOTE) SARS-CoV-2 antigen NOT DETECTED.  Negative results are presumptive.  Negative results do not preclude SARS-CoV-2 infection and should not be used as the sole basis for treatment or other patient management decisions, including infection  control decisions, particularly in the presence of clinical signs and  symptoms consistent with COVID-19, or in those who have been in contact with the virus.  Negative results must be combined with clinical observations, patient history, and epidemiological information. The expected result is Negative. Fact Sheet for Patients: PodPark.tn Fact Sheet for Healthcare Providers: GiftContent.is This test is not yet approved or cleared by the [place] FDA and  has been authorized for detection and/or diagnosis of SARS-CoV-2 by FDA under an Emergency Use Authorization (EUA).  This EUA will remain in effect (meaning this test can  be used) for the duration of  the COVID-19 de claration under Section 564(b)(1) of the Act, 21 U.S.C. section 360bbb-3(b)(1), unless the authorization is terminated or revoked sooner.   Troponin I (High Sensitivity)     Status: Abnormal   Collection Time: 07/07/19  1:44 PM  Result Value Ref Range   Troponin I (High Sensitivity) 64 (H) <18 ng/L    Comment: DELTA CHECK NOTED RESULT CALLED TO, READ BACK BY AND VERIFIED WITH: T CARTER RN AT 1512 ON [id number] BY K FORSYTH (NOTE) Elevated high sensitivity troponin I (hsTnI) values and significant  changes across serial measurements may suggest ACS but many other  chronic and acute conditions are known to elevate hsTnI results.  Refer to the Links section for chest pain algorithms and additional  guidance. Performed at [facility], [place] [address], [place],  [address]   Respiratory Panel by RT PCR (Flu A&B, Covid) - Nasopharyngeal Swab     Status: None   Collection Time: 07/07/19  1:47 PM   Specimen: Nasopharyngeal Swab  Result Value Ref Range   SARS Coronavirus 2 by RT PCR NEGATIVE NEGATIVE    Comment: (NOTE) SARS-CoV-2 target nucleic acids are NOT DETECTED. The SARS-CoV-2 RNA is generally detectable in upper respiratoy specimens during the acute phase of infection. The lowest concentration of SARS-CoV-2 viral copies this assay can detect is 131 copies/mL. A negative result does not preclude SARS-Cov-2 infection and should not be used as the sole basis for treatment or other patient management decisions. A negative result may occur with  improper specimen collection/handling, submission of specimen other than nasopharyngeal swab, presence of viral mutation(s)  within the areas targeted by this assay, and inadequate number of viral copies (<131 copies/mL). A negative result must be combined with clinical observations, patient history, and epidemiological information. The expected result is Negative. Fact Sheet for  Patients:  PinkCheek.be Fact Sheet for Healthcare Providers:  GravelBags.it This test is not yet ap proved or cleared by the [place] FDA and  has been authorized for detection and/or diagnosis of SARS-CoV-2 by FDA under an Emergency Use Authorization (EUA). This EUA will remain  in effect (meaning this test can be used) for the duration of the COVID-19 declaration under Section 564(b)(1) of the Act, 21 U.S.C. section 360bbb-3(b)(1), unless the authorization is terminated or revoked sooner.    Influenza A by PCR NEGATIVE NEGATIVE   Influenza B by PCR NEGATIVE NEGATIVE    Comment: (NOTE) The Xpert Xpress SARS-CoV-2/FLU/RSV assay is intended as an aid in  the diagnosis of influenza from Nasopharyngeal swab specimens and  should not be used as a sole basis for treatment. Nasal washings and  aspirates are unacceptable for Xpert Xpress SARS-CoV-2/FLU/RSV  testing. Fact Sheet for Patients: PinkCheek.be Fact Sheet for Healthcare Providers: GravelBags.it This test is not yet approved or cleared by the [place] FDA and  has been authorized for detection and/or diagnosis of SARS-CoV-2 by  FDA under an Emergency Use Authorization (EUA). This EUA will remain  in effect (meaning this test can be used) for the duration of the  Covid-19 declaration under Section 564(b)(1) of the Act, 21  U.S.C. section 360bbb-3(b)(1), unless the authorization is  terminated or revoked. Performed at [facility], [place] [address] [place], [place] [address]   Urinalysis, Routine w reflex microscopic     Status: Abnormal   Collection Time: 07/07/19  2:09 PM  Result Value Ref Range   Color, Urine YELLOW YELLOW   APPearance HAZY (A) CLEAR   Specific Gravity, Urine 1.014 1.005 - 1.030   pH 5.0 5.0 - 8.0   Glucose, UA NEGATIVE NEGATIVE mg/dL   Hgb urine dipstick NEGATIVE NEGATIVE    Bilirubin Urine NEGATIVE NEGATIVE   Ketones, ur NEGATIVE NEGATIVE mg/dL   Protein, ur 100 (A) NEGATIVE mg/dL   Nitrite NEGATIVE NEGATIVE   Leukocytes,Ua SMALL (A) NEGATIVE   RBC / HPF 0-5 0 - 5 RBC/hpf   WBC, UA 21-50 0 - 5 WBC/hpf   Bacteria, UA MANY (A) NONE SEEN   WBC Clumps PRESENT     Comment: Performed at [facility] [address], [place], [place] [address]   DG Chest Port 1 View  Result Date: 07/07/2019 CLINICAL DATA:  Shortness of breath, cough EXAM: PORTABLE CHEST 1 VIEW COMPARISON:  06/07/2017 FINDINGS: Heart size is mildly enlarged. Calcific aortic knob. Mildly prominent bibasilar interstitial markings. No pleural effusion or pneumothorax. IMPRESSION: Mild cardiomegaly with mild bibasilar interstitial prominence, could represent atelectasis, mild interstitial edema or atypical infection. Electronically Signed   By: Davina Poke M.D.   On: 07/07/2019 11:39    Pending Labs Unresulted Labs (From admission, onward)    Start     Ordered   07/08/19 0500  Heparin level (unfractionated)  Daily,   R     07/07/19 1145   07/08/19 0500  CBC  Daily,   R     07/07/19 1145   07/08/19 0500  Renal function panel  Tomorrow morning,   R     07/07/19 1644   07/07/19 2000  Heparin level (unfractionated)  Once-Timed,   STAT  07/07/19 1145   07/07/19 1706  Respiratory Panel by PCR  (Respiratory virus panel with precautions)  Add-on,   AD     07/07/19 1705   07/07/19 1642  Strep pneumoniae urinary antigen  Once,   STAT     07/07/19 1644   07/07/19 1642  Legionella Pneumophila Serogp 1 Ur Ag  Once,   STAT     07/07/19 1644   07/07/19 1642  Culture, sputum-assessment  Once,   R     07/07/19 1644   07/07/19 1640  HIV Antibody (routine testing w rflx)  (HIV Antibody (Routine testing w reflex) panel)  Once,   STAT     07/07/19 1644   07/07/19 1640  Hemoglobin A1c  Once,   STAT    Comments: To assess prior glycemic control    07/07/19 1644   07/07/19 1105  Urine culture  ONCE  - STAT,   STAT     07/07/19 1104   07/07/19 1104  Lactic acid, plasma  Now then every 2 hours,   STAT     07/07/19 1104   07/07/19 1104  Blood Culture (routine x 2)  BLOOD CULTURE X 2,   STAT     07/07/19 1104          Vitals/Pain Today's Vitals   07/07/19 1415 07/07/19 1430 07/07/19 1445 07/07/19 1500  BP: (!) 171/65 (!) 176/68 (!) 172/67 (!) 171/70  Pulse: 73 79 78 81  Resp: (!) 22 20 (!) 28 (!) 26  Temp:      TempSrc:      SpO2: 99% 97% 98% 95%  Height:      PainSc:        Isolation Precautions No active isolations  Medications Medications  heparin ADULT infusion 100 units/mL (25000 units/211m sodium chloride 0.45%) (1,250 Units/hr Intravenous New Bag/Given 07/07/19 1227)  furosemide (LASIX) injection 40 mg (has no administration in time range)  cloNIDine (CATAPRES) tablet 0.2 mg (has no administration in time range)  carvedilol (COREG) tablet 25 mg (has no administration in time range)  atorvastatin (LIPITOR) tablet 20 mg (has no administration in time range)  hydrALAZINE (APRESOLINE) tablet 100 mg (has no administration in time range)  isosorbide dinitrate (ISORDIL) tablet 30 mg (has no administration in time range)  ranolazine (RANEXA) 12 hr tablet 1,000 mg (has no administration in time range)  amLODipine (NORVASC) tablet 5 mg (has no administration in time range)  aspirin EC tablet 81 mg (has no administration in time range)  acetaminophen (TYLENOL) tablet 650 mg (has no administration in time range)    Or  acetaminophen (TYLENOL) suppository 650 mg (has no administration in time range)  ondansetron (ZOFRAN) tablet 4 mg (has no administration in time range)    Or  ondansetron (ZOFRAN) injection 4 mg (has no administration in time range)  insulin aspart (novoLOG) injection 0-9 Units (has no administration in time range)  cefTRIAXone (ROCEPHIN) 2 g in sodium chloride 0.9 % 100 mL IVPB (has no administration in time range)  azithromycin (ZITHROMAX) tablet 500 mg  (has no administration in time range)  cefTRIAXone (ROCEPHIN) 1 g in sodium chloride 0.9 % 100 mL IVPB (has no administration in time range)  heparin bolus via infusion 4,000 Units (4,000 Units Intravenous Bolus from Bag 07/07/19 1227)  cefTRIAXone (ROCEPHIN) 1 g in sodium chloride 0.9 % 100 mL IVPB (0 g Intravenous Stopped 07/07/19 1328)  azithromycin (ZITHROMAX) 500 mg in sodium chloride 0.9 % 250 mL IVPB (0 mg Intravenous Stopped  07/07/19 1507)    Mobility non-ambulatory Low fall risk   Focused Assessments Cardiac Assessment Handoff:  Cardiac Rhythm: Normal sinus rhythm Lab Results  Component Value Date   CKTOTAL 417 (H) 05/22/2015   CKMB 14.3 (H) 05/22/2015   TROPONINI 0.03 (HH) 04/01/2016   Lab Results  Component Value Date   DDIMER 1.46 (H) 07/07/2019   Does the Patient currently have chest pain? No  , Pulmonary Assessment Handoff:  Lung sounds: Bilateral Breath Sounds: Rales O2 Device: Room Air        R Recommendations: See Admitting Provider Note  Report given to:   Additional Notes:

## 2019-07-07 NOTE — ED Provider Notes (Signed)
[facility] EMERGENCY DEPARTMENT Provider Note   CSN: [id number] Arrival date & time: 07/07/19  1026     History Chief Complaint  Patient presents with   Shortness of Breath    Ray Merritt is a 83 y.o. male.  The history is provided by the patient.  Shortness of Breath Severity:  Moderate Onset quality:  Gradual Timing:  Constant Progression:  Worsening Chronicity:  Recurrent Context: URI (cough, sputum production but with weight gain over the last few days. )   Relieved by:  Nothing Worsened by:  Exertion Associated symptoms: cough, fever and sputum production   Associated symptoms: no abdominal pain, no chest pain, no ear pain, no rash, no sore throat, no vomiting and no wheezing   Risk factors: hx of cancer   Risk factors comment:  CKD not on dialysis, CAD      Past Medical History:  Diagnosis Date   Anemia    Arthritis    Cancer ([place]) 2015-present   CLL   Chronic kidney disease    Coronary artery disease 04/2016   1 stent   Diabetes mellitus    insulin dependent    Enlarged prostate    Headache    Hypertension    Pneumonia     Patient Active Problem List   Diagnosis Date Noted   Fever of unknown origin 08/08/2016   Dyspnea    Acute kidney injury superimposed on chronic kidney disease ([place]) 04/02/2016   Sepsis ([place]) 04/01/2016   Type 2 diabetes, HbA1c goal < 7% (HCC)    Fever 12/10/2015   BPH (benign prostatic hyperplasia) 12/10/2015   CAD (coronary artery disease), native coronary artery 05/21/2015   Coronary artery disease involving coronary bypass graft 04/30/2015   Angina pectoris associated with type 2 diabetes mellitus ([place]) 04/29/2015   Type II diabetes mellitus, uncontrolled ([place]) 01/25/2015   Chronic kidney disease (CKD) 01/25/2015   Hypertension, essential, benign 01/25/2015   Hyperlipidemia 01/25/2015   CLL (chronic lymphocytic leukemia) ([place]) 03/02/2014   Leukocytosis 01/27/2014     Past Surgical History:  Procedure Laterality Date   AV FISTULA PLACEMENT Left 04/28/2017   Procedure: Left arm ARTERIOVENOUS (AV) FISTULA CREATION;  Surgeon: Angelia Mould, MD;  Location: [place];  Service: Vascular;  Laterality: Left;   CARDIAC CATHETERIZATION N/A 05/01/2015   Procedure: Left Heart Cath and Coronary Angiography;  Surgeon: Adrian Prows, MD;  Location: [place] [facility];  Service: Cardiovascular;  Laterality: N/A;   CARDIAC CATHETERIZATION  05/01/2015   Procedure: Intravascular Pressure Wire/FFR Study;  Surgeon: Adrian Prows, MD;  Location: [place] [facility];  Service: Cardiovascular;;   CARDIAC CATHETERIZATION N/A 05/22/2015   Procedure: Left Heart Cath and Coronary Angiography;  Surgeon: Adrian Prows, MD;  Location: [place] [facility];  Service: Cardiovascular;  Laterality: N/A;   CARDIAC CATHETERIZATION N/A 05/22/2015   Procedure: Coronary Stent Intervention;  Surgeon: Adrian Prows, MD;  Location: [place] [facility];  Service: Cardiovascular;  Laterality: N/A;   COLONOSCOPY W/ BIOPSIES AND POLYPECTOMY     SHOULDER SURGERY     TONSILLECTOMY         Family History  Problem Relation Age of Onset   Hypertension Mother    Heart disease Sister    Stroke Sister    Diabetes Brother     Social History   Tobacco Use   Smoking status: Former Smoker    Packs/day: 2.00    Types: Cigarettes   Smokeless tobacco: Never Used  Tobacco comment: quit smoking in early 70's, started ate age 24  Substance Use Topics   Alcohol use: No   Drug use: No    Home Medications Prior to Admission medications   Medication Sig Start Date End Date Taking? Authorizing Provider  ALPHAGAN P 0.1 % SOLN Place 2 drops 2 (two) times daily into both eyes.    Yes [provider]  amLODipine (NORVASC) 5 MG tablet Take 1 tablet (5 mg total) by mouth daily. 04/25/19 07/24/19 Yes Miquel Dunn, NP  Ascorbic Acid (VITAMIN C) 1000 MG tablet Take 1,000 mg by mouth daily.    Yes [provider]  aspirin EC 81 MG tablet Take 81 mg by mouth at bedtime.    Yes [provider]  atorvastatin (LIPITOR) 20 MG tablet TAKE 1 TABLET BY MOUTH DAILY Patient taking differently: Take 20 mg by mouth daily.  04/15/19  Yes Miquel Dunn, NP  carvedilol (COREG) 25 MG tablet Take 25 mg by mouth 2 (two) times daily.    Yes [provider]  cholecalciferol (VITAMIN D) 1000 units tablet Take 1,000 Units by mouth daily.    Yes [provider]  cloNIDine (CATAPRES) 0.2 MG tablet Take 0.2 mg by mouth 2 (two) times daily.    Yes [provider]  CONTOUR NEXT TEST test strip USE AS DIRECTED TO CHECK BLOOD SUGAR TWICE A DAY Patient taking differently: 1 each by Other route 2 (two) times daily.  06/01/19  Yes Elayne Snare, MD  diphenhydramine-acetaminophen (TYLENOL PM) 25-500 MG TABS tablet Take 2 tablets by mouth at bedtime.   Yes [provider]  ferrous sulfate 325 (65 FE) MG tablet Take 325 mg by mouth 2 (two) times daily.    Yes [provider]  finasteride (PROSCAR) 5 MG tablet Take 5 mg by mouth every evening.    Yes [provider]  furosemide (LASIX) 80 MG tablet Take 40 mg by mouth daily.    Yes [provider]  hydrALAZINE (APRESOLINE) 100 MG tablet Take 100 mg by mouth 3 (three) times daily.    Yes [provider]  insulin NPH Human (HUMULIN N,NOVOLIN N) 100 UNIT/ML injection Inject 22 Units into the skin at bedtime. Inject 22 units under the skin every night.   Yes [provider]  isosorbide dinitrate (ISORDIL) 30 MG tablet Take 1 tablet (30 mg total) by mouth 3 (three) times daily. 06/09/19  Yes Adrian Prows, MD  Multiple Vitamin (MULTIVITAMIN WITH MINERALS) TABS tablet Take 1 tablet by mouth daily.   Yes [provider]  nitroGLYCERIN (NITROSTAT) 0.4 MG SL tablet Place 1 tablet (0.4 mg total) under the tongue every 5 (five) minutes as needed for chest pain. 03/18/19  07/07/19 Yes Miquel Dunn, NP  NOVOLIN R 100 UNIT/ML injection INJECT 4-7 UNITS SUBCUTANEOUS. 5 UNITS EVERY MORNING AND 7 UNITS AT [facility] Patient taking differently: Inject 5-7 Units into the skin See admin instructions. 5 units in the mornings and 7 units at dinner 06/16/19  Yes Elayne Snare, MD  Omega-3 Fatty Acids (FISH OIL) 1000 MG CAPS Take 1,000 mg by mouth 2 (two) times daily.   Yes [provider]  ranolazine (RANEXA) 500 MG 12 hr tablet Take 2 tablets (1,000 mg total) by mouth 2 (two) times daily. 05/11/19  Yes Miquel Dunn, NP  tamsulosin (FLOMAX) 0.4 MG CAPS capsule Take 0.4 mg by mouth daily after supper.    Yes [provider]  Travoprost, BAK Free, (TRAVATAN)  0.004 % SOLN ophthalmic solution Place 1 drop into both eyes at bedtime.   Yes [provider]  triamcinolone cream (KENALOG) 0.1 % Apply 1 application topically 2 (two) times daily as needed (for itching/irritation).    Yes [provider]    Allergies    Patient has no known allergies.  Review of Systems   Review of Systems  Constitutional: Positive for fever. Negative for chills.  HENT: Negative for ear pain and sore throat.   Eyes: Negative for pain and visual disturbance.  Respiratory: Positive for cough, sputum production and shortness of breath. Negative for wheezing.   Cardiovascular: Negative for chest pain and palpitations.  Gastrointestinal: Negative for abdominal pain and vomiting.  Genitourinary: Negative for dysuria and hematuria.  Musculoskeletal: Negative for arthralgias and back pain.  Skin: Negative for color change and rash.  Neurological: Negative for seizures and syncope.  All other systems reviewed and are negative.   Physical Exam Updated Vital Signs  ED Triage Vitals  Enc Vitals Group     BP 07/07/19 1029 (!) 190/66     Pulse Rate 07/07/19 1029 79     Resp 07/07/19 1029 18     Temp 07/07/19 1029 99.6 F (37.6 C)     Temp Source  07/07/19 1029 Oral     SpO2 07/07/19 1029 99 %     Weight --      Height --      Head Circumference --      Peak Flow --      Pain Score 07/07/19 1030 0     Pain Loc --      Pain Edu? --      Excl. in [place]? --     Physical Exam Vitals and nursing note reviewed.  Constitutional:      General: He is not in acute distress.    Appearance: He is well-developed. He is not ill-appearing.  HENT:     Head: Normocephalic and atraumatic.  Eyes:     Conjunctiva/sclera: Conjunctivae normal.     Pupils: Pupils are equal, round, and reactive to light.  Cardiovascular:     Rate and Rhythm: Normal rate and regular rhythm.     Pulses: Normal pulses.     Heart sounds: Normal heart sounds. No murmur.  Pulmonary:     Effort: Tachypnea present. No respiratory distress.     Breath sounds: Decreased breath sounds and rales present.  Abdominal:     Palpations: Abdomen is soft.     Tenderness: There is no abdominal tenderness.  Musculoskeletal:     Cervical back: Normal range of motion and neck supple.     Right lower leg: Edema (2+) present.     Left lower leg: Edema (2+) present.  Skin:    General: Skin is warm and dry.     Capillary Refill: Capillary refill takes less than 2 seconds.  Neurological:     General: No focal deficit present.     Mental Status: He is alert.  Psychiatric:        Mood and Affect: Mood normal.     ED Results / Procedures / Treatments   Labs (all labs ordered are listed, but only abnormal results are displayed) Labs Reviewed  CBC WITH DIFFERENTIAL/PLATELET - Abnormal; Notable for the following components:      Result Value   WBC 16.8 (*)    RBC 2.64 (*)    Hemoglobin 8.5 (*)    HCT 26.2 (*)  Neutro Abs 11.3 (*)    Monocytes Absolute 1.5 (*)    All other components within normal limits  COMPREHENSIVE METABOLIC PANEL - Abnormal; Notable for the following components:   CO2 15 (*)    BUN 98 (*)    Creatinine, Ser 6.61 (*)    Total Protein 6.4 (*)    AST  13 (*)    GFR calc non Af Amer 7 (*)    GFR calc Af Amer 8 (*)    All other components within normal limits  D-DIMER, QUANTITATIVE (NOT AT [facility]) - Abnormal; Notable for the following components:   D-Dimer, Quant 1.46 (*)    All other components within normal limits  FERRITIN - Abnormal; Notable for the following components:   Ferritin 420 (*)    All other components within normal limits  FIBRINOGEN - Abnormal; Notable for the following components:   Fibrinogen 613 (*)    All other components within normal limits  BRAIN NATRIURETIC PEPTIDE - Abnormal; Notable for the following components:   B Natriuretic Peptide 360.3 (*)    All other components within normal limits  URINALYSIS, ROUTINE W REFLEX MICROSCOPIC - Abnormal; Notable for the following components:   APPearance HAZY (*)    Protein, ur 100 (*)    Leukocytes,Ua SMALL (*)    Bacteria, UA MANY (*)    All other components within normal limits  TROPONIN I (HIGH SENSITIVITY) - Abnormal; Notable for the following components:   Troponin I (High Sensitivity) 32 (*)    All other components within normal limits  CULTURE, BLOOD (ROUTINE X 2)  CULTURE, BLOOD (ROUTINE X 2)  URINE CULTURE  RESPIRATORY PANEL BY RT PCR (FLU A&B, COVID)  LACTIC ACID, PLASMA  PROCALCITONIN  LACTATE DEHYDROGENASE  TRIGLYCERIDES  C-REACTIVE PROTEIN  LACTIC ACID, PLASMA  HEPARIN LEVEL (UNFRACTIONATED)  CBG MONITORING, ED  POC SARS CORONAVIRUS 2 AG -  ED  TROPONIN I (HIGH SENSITIVITY)    EKG EKG Interpretation  Date/Time:  Thursday July 07 2019 10:27:04 EST Ventricular Rate:  87 PR Interval:    QRS Duration: 111 QT Interval:  381 QTC Calculation: 459 R Axis:   56 Text Interpretation: Sinus rhythm Probable left atrial enlargement Repol abnrm suggests ischemia, diffuse leads Confirmed by Lennice Sites [id number]) on 07/07/2019 11:14:28 AM Also confirmed by Lennice Sites [phone]), editor Hattie Perch ([id number])  on 07/07/2019 2:34:54  PM   Radiology DG Chest Port 1 View  Result Date: 07/07/2019 CLINICAL DATA:  Shortness of breath, cough EXAM: PORTABLE CHEST 1 VIEW COMPARISON:  06/07/2017 FINDINGS: Heart size is mildly enlarged. Calcific aortic knob. Mildly prominent bibasilar interstitial markings. No pleural effusion or pneumothorax. IMPRESSION: Mild cardiomegaly with mild bibasilar interstitial prominence, could represent atelectasis, mild interstitial edema or atypical infection. Electronically Signed   By: Davina Poke M.D.   On: 07/07/2019 11:39    Procedures .Critical Care Performed by: Lennice Sites, DO Authorized by: Lennice Sites, DO   Critical care provider statement:    Critical care time (minutes):  35   Critical care was necessary to treat or prevent imminent or life-threatening deterioration of the following conditions:  Sepsis   Critical care was time spent personally by me on the following activities:  Blood draw for specimens, development of treatment plan with patient or surrogate, discussions with primary provider, evaluation of patient's response to treatment, examination of patient, obtaining history from patient or surrogate, ordering and performing treatments and interventions, ordering and review of laboratory studies, ordering and review of  radiographic studies, pulse oximetry, review of old charts and re-evaluation of patient's condition   I assumed direction of critical care for this patient from another provider in my specialty: no     (including critical care time)  Medications Ordered in ED Medications  heparin ADULT infusion 100 units/mL (25000 units/256mL sodium chloride 0.45%) (1,250 Units/hr Intravenous New Bag/Given 07/07/19 1227)  heparin bolus via infusion 4,000 Units (4,000 Units Intravenous Bolus from Bag 07/07/19 1227)  cefTRIAXone (ROCEPHIN) 1 g in sodium chloride 0.9 % 100 mL IVPB (0 g Intravenous Stopped 07/07/19 1328)  azithromycin (ZITHROMAX) 500 mg in sodium chloride  0.9 % 250 mL IVPB (500 mg Intravenous New Bag/Given 07/07/19 1350)    ED Course  I have reviewed the triage vital signs and the nursing notes.  Pertinent labs & imaging results that were available during my care of the patient were reviewed by me and considered in my medical decision making (see chart for details).    MDM Rules/Calculators/A&P                       Ray Merritt is an 83 year old male with history of hypertension, CAD, CKD, diabetes who presents to the ED with shortness of breath.  Patient noticed cough, sputum production, fever this morning.  Patient febrile here but otherwise normal vitals.  He has noticed weight gain over the last several days as well.  He recently has a fistula placed as he has end-stage renal disease.  He has not started dialysis.  He denies any chest pain.  He appears volume overloaded on exam with pitting edema bilaterally in his legs.  Has rales and diminished breath sounds throughout on exam.  Has some tachypnea.  No history of coronavirus.  No known exposures to coronavirus.  EKG shows sinus rhythm with diffuse ischemic changes throughout.  No obvious ST elevations.  Talked with Dr. Einar Gip his cardiologist on the phone given these ischemic changes and he does recommend starting heparin empirically at this time for likely non-ST elevation MI.  However, recommend continued infectious work-up as well and he will come evaluate the patient.  Will hold on antibiotics at this time as concern for possible coronavirus.  Anticipate admission.  Infectious work-up has been initiated with chest x-ray, Covid testing, lactic acid, blood cultures, urine studies.  Patient denies any abdominal pain.  Troponin and BNP mildly elevated.  Chest x-ray concerning for volume overload versus infectious process/pneumonia.  Urinalysis concerning for infection as well.  First Covid test is negative.  No significant lactic acidosis.  However patient does have a white count of 17.  Other  inflammatory markers mildly elevated.  Talked with Dr. Einar Gip with cardiology again and he recommends getting repeat troponin and if troponin does not change recommend stopping heparin as less likely ACS.  He will still come evaluate the patient.  More likely infectious process and cardiac process per Dr. Einar Gip and I agree with this.  Hospitalist has been called for admission for further sepsis care. Hemodynamically stable under my care.  Patient also does have some worsening of CKD as well.  Fluids held at this time due to concern for volume overload.  This chart was dictated using voice recognition software.  Despite best efforts to proofread,  errors can occur which can change the documentation meaning.   JACK BOLIO was evaluated in Emergency Department on 07/07/2019 for the symptoms described in the history of present illness. He was evaluated in the  context of the global COVID-19 pandemic, which necessitated consideration that the patient might be at risk for infection with the SARS-CoV-2 virus that causes COVID-19. Institutional protocols and algorithms that pertain to the evaluation of patients at risk for COVID-19 are in a state of rapid change based on information released by regulatory bodies including the CDC and federal and state organizations. These policies and algorithms were followed during the patient's care in the ED.   Final Clinical Impression(s) / ED Diagnoses Final diagnoses:  Sepsis, due to unspecified organism, unspecified whether acute organ dysfunction present [facility])  Community acquired pneumonia, unspecified laterality  Urinary tract infection without hematuria, site unspecified    Rx / DC Orders ED Discharge Orders    None       Lennice Sites, DO 07/07/19 1501

## 2019-07-07 NOTE — Progress Notes (Signed)
Patient reported chest pain 5/10. 1 sublingual nitro given and patient reported absence of pain. EKG performed, no acute changes noted. Dr. Einar Gip notified. Ordered to give sublingual nitro if pain reoccurs and notify Dr.Ganji. Patient resting in bed at this time. Denies pain and not in apparent distress at this time.

## 2019-07-07 NOTE — Progress Notes (Signed)
Pt received from ED. Oriented to unit and room. VSS. CHG wipes applied. Pt denies needs at this time.  Arletta Bale, RN

## 2019-07-07 NOTE — H&P (Signed)
History and Physical    [name] PRF:[id number] DOB: [birth year] DOA: 07/07/2019  Referring MD/NP/PA: Lennice Sites, MD PCP: [name], MD  Patient coming from: Home via EMS  Chief Complaint: Shortness of breath and cough  I have personally briefly reviewed patient's old medical records in [place]   HPI: Ray Merritt is a 83 y.o. male with medical history significant of hypertension, chronic kidney disease stage IV, CAD, carotid artery stenosis, DM type II, and CLL.  He presents with complaints of shortness of breath and cough with blood-tinged sputum this morning around 3 AM.  Reports having associated symptoms of chest pain running across his chest, nausea, and vomiting.  Denies any known recent sick contacts to his knowledge.  He does report associated symptoms of weight gain and lower extremity leg swelling, but reports taking his furosemide 40 mg daily.  He still makes urine.  He has a fistula on his left arm that was placed, but has not been started on hemodialysis at this time.   ED Course: On admission into the emergency department patient was seen to be febrile up to 100.6 F, blood pressure elevated up to 190/66, respirations 14-28, and O2 saturations maintained on 2 L nasal cannula oxygen placed for comfort.  Labs significant for WBC 16.8, hemoglobin 8.5, BUN 98, creatinine 6.61, and trop 32.  EKG revealed ST depressions in the inferior and lateral leads.  Dr. Einar Gip of cardiology was consulted and agree with placing patient on a heparin drip for now.  Chest x-Ray concerning for cardiomegaly with atypical pneumonia versus edema.  COVID-19 screening was negative x2 and influenza screen negative.  Patient also received empiric antibiotics of Rocephin and azithromycin.  Review of Systems  Constitutional: Positive for fever and malaise/fatigue.  HENT: Negative for congestion and nosebleeds.   Eyes: Negative for photophobia and pain.  Respiratory: Positive for  cough, sputum production and shortness of breath.   Cardiovascular: Positive for chest pain and leg swelling.  Gastrointestinal: Positive for nausea and vomiting. Negative for constipation, diarrhea and heartburn.  Genitourinary: Negative for dysuria and hematuria.  Musculoskeletal: Negative for falls.  Skin: Negative for itching.  Neurological: Negative for focal weakness and loss of consciousness.  Psychiatric/Behavioral: Negative for substance abuse. The patient has insomnia.     Past Medical History:  Diagnosis Date  . Anemia   . Arthritis   . Cancer ([place]) 2015-present   CLL  . Chronic kidney disease   . Coronary artery disease 04/2016   1 stent  . Diabetes mellitus    insulin dependent   . Enlarged prostate   . Headache   . Hypertension   . Pneumonia     Past Surgical History:  Procedure Laterality Date  . AV FISTULA PLACEMENT Left 04/28/2017   Procedure: Left arm ARTERIOVENOUS (AV) FISTULA CREATION;  Surgeon: Angelia Mould, MD;  Location: [facility];  Service: Vascular;  Laterality: Left;  . CARDIAC CATHETERIZATION N/A 05/01/2015   Procedure: Left Heart Cath and Coronary Angiography;  Surgeon: Adrian Prows, MD;  Location: [facility];  Service: Cardiovascular;  Laterality: N/A;  . CARDIAC CATHETERIZATION  05/01/2015   Procedure: Intravascular Pressure Wire/FFR Study;  Surgeon: Adrian Prows, MD;  Location: [facility];  Service: Cardiovascular;;  . CARDIAC CATHETERIZATION N/A 05/22/2015   Procedure: Left Heart Cath and Coronary Angiography;  Surgeon: Adrian Prows, MD;  Location: [facility];  Service: Cardiovascular;  Laterality: N/A;  . CARDIAC CATHETERIZATION N/A 05/22/2015  Procedure: Coronary Stent Intervention;  Surgeon: Adrian Prows, MD;  Location: [facility];  Service: Cardiovascular;  Laterality: N/A;  . COLONOSCOPY W/ BIOPSIES AND POLYPECTOMY    . SHOULDER SURGERY    . TONSILLECTOMY       reports that he has quit smoking. His smoking use  included cigarettes. He smoked 2.00 packs per day. He has never used smokeless tobacco. He reports that he does not drink alcohol or use drugs.  No Known Allergies  Family History  Problem Relation Age of Onset  . Hypertension Mother   . Heart disease Sister   . Stroke Sister   . Diabetes Brother     Prior to Admission medications   Medication Sig Start Date End Date Taking? Authorizing Provider  ALPHAGAN P 0.1 % SOLN Place 2 drops 2 (two) times daily into both eyes.    Yes [provider]  amLODipine (NORVASC) 5 MG tablet Take 1 tablet (5 mg total) by mouth daily. 04/25/19 07/24/19 Yes Miquel Dunn, NP  Ascorbic Acid (VITAMIN C) 1000 MG tablet Take 1,000 mg by mouth daily.   Yes [provider]  aspirin EC 81 MG tablet Take 81 mg by mouth at bedtime.    Yes [provider]  atorvastatin (LIPITOR) 20 MG tablet TAKE 1 TABLET BY MOUTH DAILY Patient taking differently: Take 20 mg by mouth daily.  04/15/19  Yes Miquel Dunn, NP  carvedilol (COREG) 25 MG tablet Take 25 mg by mouth 2 (two) times daily.    Yes [provider]  cholecalciferol (VITAMIN D) 1000 units tablet Take 1,000 Units by mouth daily.    Yes [provider]  cloNIDine (CATAPRES) 0.2 MG tablet Take 0.2 mg by mouth 2 (two) times daily.    Yes [provider]  CONTOUR NEXT TEST test strip USE AS DIRECTED TO CHECK BLOOD SUGAR TWICE A DAY Patient taking differently: 1 each by Other route 2 (two) times daily.  06/01/19  Yes Elayne Snare, MD  diphenhydramine-acetaminophen (TYLENOL PM) 25-500 MG TABS tablet Take 2 tablets by mouth at bedtime.   Yes [provider]  ferrous sulfate 325 (65 FE) MG tablet Take 325 mg by mouth 2 (two) times daily.    Yes [provider]  finasteride (PROSCAR) 5 MG tablet Take 5 mg by mouth every evening.    Yes [provider]  furosemide (LASIX) 80 MG tablet Take 40 mg by mouth daily.    Yes [provider]  hydrALAZINE (APRESOLINE) 100 MG tablet Take 100 mg by mouth 3 (three) times daily.    Yes [provider]  insulin NPH Human (HUMULIN N,NOVOLIN N) 100 UNIT/ML injection Inject 22 Units into the skin at bedtime. Inject 22 units under the skin every night.   Yes [provider]  isosorbide dinitrate (ISORDIL) 30 MG tablet Take 1 tablet (30 mg total) by mouth 3 (three) times daily. 06/09/19  Yes Adrian Prows, MD  Multiple Vitamin (MULTIVITAMIN WITH MINERALS) TABS tablet Take 1 tablet by mouth daily.   Yes [provider]  nitroGLYCERIN (NITROSTAT) 0.4 MG SL tablet Place 1 tablet (0.4 mg total) under the tongue every 5 (five) minutes as needed for chest pain. 03/18/19 07/07/19 Yes Miquel Dunn, NP  NOVOLIN R 100 UNIT/ML injection INJECT 4-7 UNITS SUBCUTANEOUS. 5 UNITS EVERY MORNING AND 7 UNITS AT [facility] Patient taking differently: Inject 5-7 Units into the skin See admin instructions. 5 units in the mornings and 7 units  at dinner 06/16/19  Yes Elayne Snare, MD  Omega-3 Fatty Acids (FISH OIL) 1000 MG CAPS Take 1,000 mg by mouth 2 (two) times daily.   Yes [provider]  ranolazine (RANEXA) 500 MG 12 hr tablet Take 2 tablets (1,000 mg total) by mouth 2 (two) times daily. 05/11/19  Yes Miquel Dunn, NP  tamsulosin (FLOMAX) 0.4 MG CAPS capsule Take 0.4 mg by mouth daily after supper.    Yes [provider]  Travoprost, BAK Free, (TRAVATAN) 0.004 % SOLN ophthalmic solution Place 1 drop into both eyes at bedtime.   Yes [provider]  triamcinolone cream (KENALOG) 0.1 % Apply 1 application topically 2 (two) times daily as needed (for itching/irritation).    Yes [provider]    Physical Exam:  Constitutional: Elderly male who appears to be acutely ill Vitals:   07/07/19 1100 07/07/19 1230 07/07/19 1300 07/07/19 1345  BP:  (!) 176/66 (!) 180/67 (!) 174/65  Pulse:   72 71  Resp:  14 17 16   Temp: (!) 100.6 F  (38.1 C)     TempSrc: Rectal     SpO2:   96% 99%  Height: 6\' 3"  (1.905 m)      Eyes: PERRL, lids and conjunctivae normal ENMT: Mucous membranes are moist. Posterior pharynx clear of any exudate or lesions. Neck: normal, supple, no masses, no thyromegaly.  Positive JVD. Respiratory: Normal respiratory effort at this time on 2 L nasal cannula oxygen.  Breath sounds are decreased.  Patient able to talk in complete sentences. Cardiovascular: Regular rate and rhythm, no murmurs / rubs / gallops.  At least 2+ pitting bilateral lower extremity edema. 2+ pedal pulses. No carotid bruits.  Left upper extremity fistula in place Abdomen: no tenderness, no masses palpated. No hepatosplenomegaly. Bowel sounds positive.  Musculoskeletal: no clubbing / cyanosis. No joint deformity upper and lower extremities. Good ROM, no contractures. Normal muscle tone.  Skin: no rashes, lesions, ulcers. No induration Neurologic: CN 2-12 grossly intact. Sensation intact, DTR normal. Strength 5/5 in all 4.  Psychiatric: Normal judgment and insight. Alert and oriented x 3. Normal mood.     Labs on Admission: I have personally reviewed following labs and imaging studies  CBC: Recent Labs  Lab 07/07/19 1057  WBC 16.8*  NEUTROABS 11.3*  HGB 8.5*  HCT 26.2*  MCV 99.2  PLT 785   Basic Metabolic Panel: Recent Labs  Lab 07/07/19 1057  NA 138  K 4.9  CL 111  CO2 15*  GLUCOSE 97  BUN 98*  CREATININE 6.61*  CALCIUM 9.2   GFR: Estimated Creatinine Clearance: 11.4 mL/min (A) (by C-G formula based on SCr of 6.61 mg/dL (H)). Liver Function Tests: Recent Labs  Lab 07/07/19 1057  AST 13*  ALT 14  ALKPHOS 61  BILITOT 0.6  PROT 6.4*  ALBUMIN 3.5   No results for input(s): LIPASE, AMYLASE in the last 168 hours. No results for input(s): AMMONIA in the last 168 hours. Coagulation Profile: No results for input(s): INR, PROTIME in the last 168 hours. Cardiac Enzymes: No results for input(s): CKTOTAL, CKMB,  CKMBINDEX, TROPONINI in the last 168 hours. BNP (last 3 results) No results for input(s): PROBNP in the last 8760 hours. HbA1C: No results for input(s): HGBA1C in the last 72 hours. CBG: Recent Labs  Lab 07/07/19 1050  GLUCAP 87   Lipid Profile: Recent Labs    07/07/19 1057  TRIG 71   Thyroid Function Tests: No results for input(s): TSH, T4TOTAL,  FREET4, T3FREE, THYROIDAB in the last 72 hours. Anemia Panel: Recent Labs    07/07/19 1057  FERRITIN 420*   Urine analysis:    Component Value Date/Time   COLORURINE STRAW (A) 06/07/2017 [place] 06/07/2017 1447   LABSPEC 1.009 06/07/2017 1447   PHURINE 5.0 06/07/2017 1447   GLUCOSEU NEGATIVE 06/07/2017 1447   HGBUR NEGATIVE 06/07/2017 [place] 06/07/2017 1447   KETONESUR NEGATIVE 06/07/2017 1447   PROTEINUR NEGATIVE 06/07/2017 1447   UROBILINOGEN 0.2 10/07/2011 1203   NITRITE NEGATIVE 06/07/2017 1447   LEUKOCYTESUR NEGATIVE 06/07/2017 1447   Sepsis Labs: No results found for this or any previous visit (from the past 240 hour(s)).   Radiological Exams on Admission: DG Chest Port 1 View  Result Date: 07/07/2019 CLINICAL DATA:  Shortness of breath, cough EXAM: PORTABLE CHEST 1 VIEW COMPARISON:  06/07/2017 FINDINGS: Heart size is mildly enlarged. Calcific aortic knob. Mildly prominent bibasilar interstitial markings. No pleural effusion or pneumothorax. IMPRESSION: Mild cardiomegaly with mild bibasilar interstitial prominence, could represent atelectasis, mild interstitial edema or atypical infection. Electronically Signed   By: Davina Poke M.D.   On: 07/07/2019 11:39    EKG: Independently reviewed.  Sinus rhythm 87 bpm with ST depressions.  Assessment/Plan Sepsis secondary to suspected community-acquired pneumonia: Acute.  Patient presents with complaints of shortness of breath and cough.  He was noted to be febrile 100.6 F and tachypneic.  Labs revealed WBC elevated at 16.8, but  lactic acid was reassuring at 1.  Chest x-Ray concerning for interstitial edema or atypical infection.  Point-of-care COVID-19 screening was negative.  Patient had been empirically started on antibiotics of Rocephin and azithromycin.  Suspect sepsis secondary to community-acquired pneumonia, but urine is also a possible source of reaction. 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  Patient presents with BNP elevated at 350.  Chest x-Ray noted showed cardiomegaly with mild bibasilar interstitial prominence concerning for possible edema.  Last EF noted to be 35% with grade 2 diastolic dysfunction in September. -Strict intake and output -Daily weights -Lasix 40 mg IV twice daily  Acute renal failure superimposed on chronic kidney disease stage IV: Patient's baseline creatinine had previously been around 3-4.  He presents with creatinine elevated up to 6.61 with BUN 98.  Suspect some aspect of this may be related to hypoperfusion setting of patient being acutely fluid overloaded.  He still reports being able to make urine at this time.  Patient has fistula in place -Continue to monitor kidney function while attempting to diurese -Nephrology formally consulted, will follow-up for further recommendations  Elevated troponin, abnormal EKG: Patient noted to have high-sensitivity troponin elevated at 32->64.  EKG with ST depressions in lateral and inferior leads.  Patient had stress test in September 2020 that showed a very small area of ischemia that was not previously noted on his stress test from 2018. -Continue heparin gtt per pharmacy   -Dr. Einar Gip of cardiology consulted, we will  follow for further recommendations  Hypertensive urgency: Acute.  On admission blood pressures elevated up to 190/66. -Continue home regimen of amlodipine, hydralazine, isosorbide dinitrate, Coreg, clonidine, Ranexa  Urinary tract infection: UA was positive for small leukocytes, many bacteria, and 21-50 WBCs.  -Follow-up urine culture -Empiric antibiotics  as noted above  Anemia of chronic kidney disease: Hemoglobin 8.5 which appears near his baseline 8-9 per review of records. -Continue to monitor  CLL: Patient followed in outpatient setting by Dr. Earlie Server previously noted to have white blood cell counts within normal limits to admission today. -Continue outpatient follow-up with Dr. Earlie Server  Insulin-dependent diabetes mellitus: Last hemoglobin A1c noted to be 6.1.  Initial blood glucose is within normal limits at 97. -Hypoglycemic protocol -CBGs before every meal with sensitive SSI  DVT prophylaxis: heparin gtt Code Status: Full Family Communication: No family present at bedside Disposition Plan: To be determined Consults called: Cardiology, nephrology Admission status: inpatient   Norval Morton MD [facility] Pager [phone]   If 7PM-7AM, please contact night-coverage [email or web address] Password [email or web address]  07/07/2019, 2:09 PM

## 2019-07-07 NOTE — ED Notes (Signed)
Threasa Beards (daughter) [phone]

## 2019-07-07 NOTE — Telephone Encounter (Signed)
Pt daughter called to let you know that pt has been taken to [place] for fluid build up and wanted you to be aware

## 2019-07-07 NOTE — Consult Note (Addendum)
CARDIOLOGY CONSULT NOTE  Patient ID: Ray Merritt MRN: [id number] DOB/AGE: Dec 01, 1935 83 y.o.  Admit date: 07/07/2019 Referring Physician  Fuller Plan, MD Primary Physician:  [name], MD Reason for Consultation  CHF and abnormal EKG  Patient ID: Ray Merritt, male    DOB: 05-11-1936, 83 y.o.   MRN: [id number]  Chief Complaint  Patient presents with  . Shortness of Breath   HPI:    Ray Merritt  is a 83 y.o. AAM with hypertension, hyperlipidemia, controlled type 2 diabetes, stage IV chronic kidney disease, CAD S/P angioplasty 05/22/2015 with 3.5 mm resolute DES to the mid LAD, Moderate 50-60% stenosis and RCA.  Symptoms of angina has improved on medical therapy and he has had an abnormal nuclear stress test in Sept 2020. He also has moderate decrease in LVEF at 35% He had been doing well until a week ago started noticing "minor cold" back for the past 2 days started getting slightly worse but this morning suddenly became markedly dyspneic, hence presented to the emergency room.  He has also been running low-grade temperature and has been coughing yellowish-greenish sputum for the past 1 week.  He also had mild hemoptysis this morning.  No chills, no loss of smell, no urinary disturbances.  Also states that his leg edema has gotten worse for the past few weeks.  He has also been having orthopnea for the last 1 week.  No chest pain or palpitations. Past Medical History:  Diagnosis Date  . Anemia   . Arthritis   . Cancer ([place]) 2015-present   CLL  . Chronic kidney disease   . Coronary artery disease 04/2016   1 stent  . Diabetes mellitus    insulin dependent   . Enlarged prostate   . Headache   . Hypertension   . Pneumonia    Past Surgical History:  Procedure Laterality Date  . AV FISTULA PLACEMENT Left 04/28/2017   Procedure: Left arm ARTERIOVENOUS (AV) FISTULA CREATION;  Surgeon: Angelia Mould, MD;  Location: [facility];  Service: Vascular;  Laterality:  Left;  . CARDIAC CATHETERIZATION N/A 05/01/2015   Procedure: Left Heart Cath and Coronary Angiography;  Surgeon: Adrian Prows, MD;  Location: [facility];  Service: Cardiovascular;  Laterality: N/A;  . CARDIAC CATHETERIZATION  05/01/2015   Procedure: Intravascular Pressure Wire/FFR Study;  Surgeon: Adrian Prows, MD;  Location: [facility];  Service: Cardiovascular;;  . CARDIAC CATHETERIZATION N/A 05/22/2015   Procedure: Left Heart Cath and Coronary Angiography;  Surgeon: Adrian Prows, MD;  Location: [facility];  Service: Cardiovascular;  Laterality: N/A;  . CARDIAC CATHETERIZATION N/A 05/22/2015   Procedure: Coronary Stent Intervention;  Surgeon: Adrian Prows, MD;  Location: [facility];  Service: Cardiovascular;  Laterality: N/A;  . COLONOSCOPY W/ BIOPSIES AND POLYPECTOMY    . SHOULDER SURGERY    . TONSILLECTOMY     Social History   Socioeconomic History  . Marital status: Married    Spouse name: Not on file  . Number of children: 3  . Years of education: Not on file  . Highest education level: Not on file  Occupational History  . Occupation: retired  Tobacco Use  . Smoking status: Former Smoker    Packs/day: 2.00    Types: Cigarettes  . Smokeless tobacco: Never Used  . Tobacco comment: quit smoking in early 70's, started ate age 55  Substance and Sexual Activity  . Alcohol use: No  . Drug use: No  .  Sexual activity: Not on file  Other Topics Concern  . Not on file  Social History Narrative  . Not on file   Social Determinants of Health   Financial Resource Strain:   . Difficulty of Paying Living Expenses: Not on file  Food Insecurity:   . Worried About Charity fundraiser in the Last Year: Not on file  . Ran Out of Food in the Last Year: Not on file  Transportation Needs:   . Lack of Transportation (Medical): Not on file  . Lack of Transportation (Non-Medical): Not on file  Physical Activity:   . Days of Exercise per Week: Not on file  . Minutes of  Exercise per Session: Not on file  Stress:   . Feeling of Stress : Not on file  Social Connections:   . Frequency of Communication with Friends and Family: Not on file  . Frequency of Social Gatherings with Friends and Family: Not on file  . Attends Religious Services: Not on file  . Active Member of Clubs or Organizations: Not on file  . Attends Archivist Meetings: Not on file  . Marital Status: Not on file  Intimate Partner Violence:   . Fear of Current or Ex-Partner: Not on file  . Emotionally Abused: Not on file  . Physically Abused: Not on file  . Sexually Abused: Not on file   ROS  Review of Systems  Constitution: Positive for fever and malaise/fatigue. Negative for chills, decreased appetite and weight gain.  Cardiovascular: Positive for claudication, dyspnea on exertion and leg swelling. Negative for syncope.  Respiratory: Positive for cough, hemoptysis and wheezing.   Endocrine: Negative for cold intolerance.  Hematologic/Lymphatic: Does not bruise/bleed easily.  Musculoskeletal: Positive for arthritis and muscle weakness. Negative for joint swelling.  Gastrointestinal: Negative for abdominal pain, anorexia, change in bowel habit, hematochezia and melena.  Neurological: Negative for headaches and light-headedness.  Psychiatric/Behavioral: Negative for depression and substance abuse.  All other systems reviewed and are negative.  Objective   Vitals with BMI 07/07/2019 07/07/2019 07/07/2019  Height - - -  Weight - - -  BMI - - -  Systolic 295 621 308  Diastolic 77 70 67  Pulse - 81 78    Blood pressure (!) 160/77, pulse 81, temperature (!) 100.6 F (38.1 C), temperature source Rectal, resp. rate 17, height 6\' 3"  (1.905 m), SpO2 95 %.    Physical Exam  Constitutional: He appears well-developed and well-nourished. He appears distressed (mild respiratory distress, appears ill).  HENT:  Head: Atraumatic.  Eyes: Conjunctivae are normal.  Neck: No JVD  present. No thyromegaly present.  Cardiovascular: Normal rate, regular rhythm, normal heart sounds and intact distal pulses. Exam reveals no gallop.  No murmur heard. Pulses:      Carotid pulses are 2+ on the right side and 2+ on the left side.      Dorsalis pedis pulses are 1+ on the right side and 1+ on the left side.       Posterior tibial pulses are 1+ on the right side and 1+ on the left side.  2+ bilateral pitting edema below the knee.  JVD could not be made out due to dark skin and mild tachypnea.  Pulmonary/Chest: He is in respiratory distress (mild with tachypnea). He has wheezes (scattered).  Abdominal: Soft. Bowel sounds are normal.  Musculoskeletal:        General: Normal range of motion.     Cervical back: Neck supple.  Neurological:  He is alert.  Skin: Skin is warm and dry.  Psychiatric: He has a normal mood and affect.   Laboratory examination:    Recent Labs    07/16/18 0853 03/10/19 1032 07/07/19 1057  NA  --  134* 138  K  --  4.8 4.9  CL  --  108 111  CO2  --  16* 15*  GLUCOSE 76 137* 97  BUN  --  85* 98*  CREATININE  --  4.35* 6.61*  CALCIUM  --  8.9 9.2  GFRNONAA  --  12* 7*  GFRAA  --  14* 8*   estimated creatinine clearance is 11.4 mL/min (A) (by C-G formula based on SCr of 6.61 mg/dL (H)).  CMP Latest Ref Rng & Units 07/07/2019 03/10/2019 07/16/2018  Glucose 70 - 99 mg/dL 97 137(H) 76  BUN 8 - 23 mg/dL 98(H) 85(H) -  Creatinine 0.61 - 1.24 mg/dL 6.61(H) 4.35(HH) -  Sodium 135 - 145 mmol/L 138 134(L) -  Potassium 3.5 - 5.1 mmol/L 4.9 4.8 -  Chloride 98 - 111 mmol/L 111 108 -  CO2 22 - 32 mmol/L 15(L) 16(L) -  Calcium 8.9 - 10.3 mg/dL 9.2 8.9 -  Total Protein 6.5 - 8.1 g/dL 6.4(L) 6.0(L) -  Total Bilirubin 0.3 - 1.2 mg/dL 0.6 0.3 -  Alkaline Phos 38 - 126 U/L 61 64 -  AST 15 - 41 U/L 13(L) 13(L) -  ALT 0 - 44 U/L 14 15 -   CBC Latest Ref Rng & Units 07/07/2019 03/10/2019 09/09/2018  WBC 4.0 - 10.5 K/uL 16.8(H) 8.3 9.1  Hemoglobin 13.0 - 17.0  g/dL 8.5(L) 8.4(L) 9.1(L)  Hematocrit 39.0 - 52.0 % 26.2(L) 26.2(L) 28.3(L)  Platelets 150 - 400 K/uL 248 170 185   Lipid Panel     Component Value Date/Time   CHOL 115 03/15/2018 0917   TRIG 71 07/07/2019 1057   HDL 33.20 (L) 03/15/2018 0917   CHOLHDL 3 03/15/2018 0917   VLDL 19.4 03/15/2018 0917   LDLCALC 62 03/15/2018 0917   LDLDIRECT 60.0 05/12/2017 0909   HEMOGLOBIN A1C Lab Results  Component Value Date   HGBA1C 6.1 07/16/2018   MPG 171 12/10/2015    Ref Range & Units 10:57 07/07/19   D-Dimer, Quant 0.00 - 0.50 ug/mL-FEU 1.46High       TSH No results for input(s): TSH in the last 8760 hours. BNP (last 3 results) Recent Labs    07/07/19 1057  BNP 360.3*   Troponin I (High Sensitivity) 07/07/19  <18 ng/L 64High   32High  CM   Comment: DELTA CHECK NOTED    Medications and allergies  No Known Allergies   . cefTRIAXone (ROCEPHIN)  IV    . [START ON 07/08/2019] cefTRIAXone (ROCEPHIN)  IV    . heparin 1,250 Units/hr (07/07/19 1227)    Current Outpatient Medications  Medication Instructions  . ALPHAGAN P 0.1 % SOLN 2 drops, Both Eyes, 2 times daily  . amLODipine (NORVASC) 5 mg, Oral, Daily  . aspirin EC 81 mg, Oral, Daily at bedtime  . atorvastatin (LIPITOR) 20 MG tablet TAKE 1 TABLET BY MOUTH DAILY  . carvedilol (COREG) 25 mg, Oral, 2 times daily  . cholecalciferol (VITAMIN D) 1,000 Units, Oral, Daily  . cloNIDine (CATAPRES) 0.2 mg, Oral, 2 times daily  . CONTOUR NEXT TEST test strip USE AS DIRECTED TO CHECK BLOOD SUGAR TWICE A DAY  . diphenhydramine-acetaminophen (TYLENOL PM) 25-500 MG TABS tablet 2 tablets, Oral, Daily at bedtime  .  ferrous sulfate 325 mg, Oral, 2 times daily  . finasteride (PROSCAR) 5 mg, Oral, Every evening  . Fish Oil 1,000 mg, Oral, 2 times daily  . furosemide (LASIX) 40 mg, Oral, Daily  . hydrALAZINE (APRESOLINE) 100 mg, Oral, 3 times daily  . insulin NPH Human (NOVOLIN N) 22 Units, Subcutaneous, Daily at bedtime, Inject 22 units  under the skin every night.  . isosorbide dinitrate (ISORDIL) 30 mg, Oral, 3 times daily  . Multiple Vitamin (MULTIVITAMIN WITH MINERALS) TABS tablet 1 tablet, Oral, Daily  . nitroGLYCERIN (NITROSTAT) 0.4 mg, Sublingual, Every 5 min PRN  . NOVOLIN R 100 UNIT/ML injection INJECT 4-7 UNITS SUBCUTANEOUS. 5 UNITS EVERY MORNING AND 7 UNITS AT DINNER  . ranolazine (RANEXA) 1,000 mg, Oral, 2 times daily  . tamsulosin (FLOMAX) 0.4 mg, Oral, Daily after supper  . Travoprost, BAK Free, (TRAVATAN) 0.004 % SOLN ophthalmic solution 1 drop, Both Eyes, Daily at bedtime  . triamcinolone cream (KENALOG) 0.1 % 1 application, Topical, 2 times daily PRN  . vitamin C 1,000 mg, Oral, Daily   Scheduled Meds: . amLODipine  5 mg Oral Daily  . aspirin EC  81 mg Oral QHS  . atorvastatin  20 mg Oral Daily  . [START ON 07/08/2019] azithromycin  500 mg Oral Daily  . carvedilol  25 mg Oral BID  . cloNIDine  0.2 mg Oral BID  . furosemide  40 mg Intravenous BID  . hydrALAZINE  100 mg Oral TID  . insulin aspart  0-9 Units Subcutaneous TID WC  . isosorbide dinitrate  30 mg Oral TID  . ranolazine  1,000 mg Oral BID   Continuous Infusions: . cefTRIAXone (ROCEPHIN)  IV 1 g (07/07/19 1818)  . [START ON 07/08/2019] cefTRIAXone (ROCEPHIN)  IV    . heparin 1,250 Units/hr (07/07/19 1227)   PRN Meds:.acetaminophen **OR** acetaminophen, ondansetron **OR** ondansetron (ZOFRAN) IV  No intake/output data recorded. No intake/output data recorded.    Radiology:   Imaging: DG Chest Port 1 View  Result Date: 07/07/2019 CLINICAL DATA:  Shortness of breath, cough EXAM: PORTABLE CHEST 1 VIEW COMPARISON:  06/07/2017 FINDINGS: Heart size is mildly enlarged. Calcific aortic knob. Mildly prominent bibasilar interstitial markings. No pleural effusion or pneumothorax. IMPRESSION: Mild cardiomegaly with mild bibasilar interstitial prominence, could represent atelectasis, mild interstitial edema or atypical infection. Electronically  Signed   By: Davina Poke M.D.   On: 07/07/2019 11:39   Cardiac Studies:   Coronary Angiogram [05/22/2015]: PCI mid LAD with 3.5x26 mm Resolute DES. Periprocedural MI with septal branch occlusion.  Lower Extremity Dopplers [03/22/2015]: No hemodynamically significant stenoses are identified in the right lower extremity arterial system. Bilateral deep formal arteries show biphasic waveforms with < 50% stenosis. Moderate velocity increase at the left posterior tibial artery suggestive of >50% stenosis with diffuse disease in the AT. This exam reveals normal perfusion of both the lower extremities with ABI of 1.01.  Lexiscan Myoview Stress Test 03/30/19 Resting EKG the onset normal sinus rhythm, nonspecific T abnormality. Stress EKG is non-diagnostic as a pharmacologic stress test using Lexiscan. The t.i.d. index is 0.86. Left ventricle is dilated both at rest and stress images at 281 mL. Perfusion images reveal a large sized inferior, inferoseptal non-transmural myocardial infarction with very minimal peri-infarct ischemia. Left ventricle systolic function Evaluated by QGS was moderately depressed at 37% with global hypokinesis. This is a high risk study. No significant change compared to the report of nuclear stress test on 12/19/2016 although ischemia was not evident previously.  Echocardiogram 03/22/2019: Left ventricle cavity is mildly dilated. Moderate concentric hypertrophy of the left ventricle. Left ventricle regional wall motion findings: Basal anteroseptal and Mid anteroseptal hypokinesis. Moderately depressed LV systolic function with EF 35%. Doppler evidence of grade II (pseudonormal) diastolic dysfunction, elevated LAP.  Left atrial cavity is severely dilated. Trileaflet aortic valve. Trace aortic regurgitation. Moderate (Grade III) mitral regurgitation. Moderate tricuspid regurgitation. Estimated pulmonary artery systolic pressure is 54 mmHg.  11/03/2017, there is interval  worsening of systolic and diastolic function, valvular regurgitation, and pulmonary hypertension.  Carotid artery duplex 05/10/2019: Stenosis in the right external carotid artery (<50%). Stenosis in the left internal carotid artery (50-69%). Stenosis in the left external carotid artery (<50%). Antegrade right vertebral artery flow. Antegrade left vertebral artery flow. Compared to the study done on 04/30/2018, left ICA stenosis severity has progressed from less than 50%. Follow up in six months is appropriate if clinically indicated.  Assessment   1.  Acute respiratory distress, symptoms improved since being in the hospital, suspect a component of pneumonia superimposed with congestive heart failure.  BNP only mildly elevated. 2.  Acute on chronic systolic and diastolic heart failure 3.  Unstable angina pectoris with profound EKG abnormalities and positive serum troponin for non-STEMI.  This is also aggravated by demand ischemia due to underlying pulmonary issue. 4.  Acute on chronic kidney disease stage IV.  Suspect he will end up needing dialysis.  EKG 07/07/2019: Normal sinus rhythm at rate of 87 bpm, normal axis, LVH.  3 mm horizontal ST depression in the inferior and lateral leads, subendocardial ischemia versus infarct.  Compared to previous EKG, ST changes are new.  Abnormal EKG. EKG 03/18/2019: Normal sinus rhythm at 66 bpm, normal axis, T wave abnormality in inferolateral leads, cannot exclude ischemia.   Recommendations:  I would recommend continuing IV heparin for stabilization, would recommend medical therapy for underlying CAD, he has had a abnormal nuclear stress test recently and surely has underlying coronary disease and NSTEMI superimposed on probable pneumonia.  Acute renal failure also poses an issue, fortunately it appears that he is making urine.  Nephrology is on board.  He needs aggressive diuresis as well due to significant leg edema.  D-dimer is abnormal, will obtain  lower extremity venous duplex to exclude DVT, most likely leg edema is due to dependency and heart failure, renal failure contributing.  Recently amlodipine was also increased to 10 mg for hypertension and this may have contributed as well.  Needs strict I's and O's.  I will continue to follow.  I do not plan to perform any cardiac catheterization during this admission unless he becomes hemodynamically unstable.  He probably would benefit from dialysis if he does not respond to IV diuretics.  I increase the dose of Lasix from 40 mg twice daily to 80 mg 3 times daily.  Placed strict I's and O's orders and daily weights and ordered lower extremity venous duplex.  Adrian Prows, MD, [facility] 07/07/2019, 6:10 PM [facility] Pager: [phone] Office: [phone]

## 2019-07-07 NOTE — Progress Notes (Signed)
ANTICOAGULATION CONSULT NOTE - Initial Consult  Pharmacy Consult for heparin Indication: chest pain/ACS  No Known Allergies  Patient Measurements: Height: 6\' 3"  (190.5 cm) IBW/kg (Calculated) : 84.5 Heparin Dosing Weight: 102 kg  Vital Signs: Temp: 100.6 F (38.1 C) (12/17 1100) Temp Source: Rectal (12/17 1100) BP: 177/93 (12/17 1045) Pulse Rate: 75 (12/17 1045)  Labs: No results for input(s): HGB, HCT, PLT, APTT, LABPROT, INR, HEPARINUNFRC, HEPRLOWMOCWT, CREATININE, CKTOTAL, CKMB, TROPONINIHS in the last 72 hours.  CrCl cannot be calculated (Patient's most recent lab result is older than the maximum 21 days allowed.).   Medical History: Past Medical History:  Diagnosis Date  . Anemia   . Arthritis   . Cancer ([place]) 2015-present   CLL  . Chronic kidney disease   . Coronary artery disease 04/2016   1 stent  . Diabetes mellitus    insulin dependent   . Enlarged prostate   . Headache   . Hypertension   . Pneumonia     Assessment: 44 yom presenting with blood tinged sputum, bilateral LL edema. EKG showing repolarizing abnormality suggesting ischemia. No AC PTA.  Hgb 8.5, plt 248. No s/sx of bleeding outside of blood tinged sputum.   Goal of Therapy:  Heparin level 0.3-0.7 units/ml Monitor platelets by anticoagulation protocol: Yes   Plan:  Give 4000 units bolus x 1 Start heparin infusion at 1250 units/hr Check anti-Xa level in 8 hours and daily while on heparin Continue to monitor H&H and platelets  Antonietta Jewel, PharmD, BCCCP Clinical Pharmacist  Phone: [phone]  Please check AMION for all [place] phone numbers After 10:00 PM, call [place] [phone] 07/07/2019,11:31 AM

## 2019-07-07 NOTE — Consult Note (Signed)
[facility] Admit Date: 07/07/2019 07/07/2019 [name] Requesting Physician:  [place]   Reason for Consult:  SOB, CKD6 HPI:  83 year old male with CKD 5 followed by Dr. Posey Pronto in our clinic for CKD 5.  He was last seen 12/15 with a creatinine of 5.6 and otherwise doing relatively well, no uremia was present, blood pressure was normal, volume status was stable.  He did have anemia and the plan was to initiate erythropoietin and intravenous iron.  He has a left forearm AV fistula which is deemed mature.  His other history includes CAD, CLL in remission, DM 2.  He presented to the ER today with 24 hours of shortness of breath and cough.  He had low-grade fevers.  EKG with concern for ischemic ST changes and cardiology will be evaluating.  Chest x-ray with question of pneumonia or edema.  Covid testing negative.  Influenza screen negative.  Started on broad-spectrum antibiotics for pneumonia.  Blood pressures have been elevated, he is not sure if he took his medications.  He denies anorexia, nausea/vomiting.  No itching or hiccuping.  However, he has significant edema and asterixis on exam.     Creatinine (mg/dL)  Date Value  03/10/2019 4.35 (HH)  03/10/2017 3.4 (HH)  09/09/2016 3.4 (HH)  03/11/2016 3.2 (HH)  06/19/2015 2.7 (H)  12/13/2014 2.4 (H)  01/27/2014 2.2 (H)   Creatinine, Ser (mg/dL)  Date Value  07/07/2019 6.61 (H)  09/10/2017 3.17 (HH)  08/17/2017 3.27 (H)  06/07/2017 3.85 (H)  02/09/2017 3.43 (H)  01/27/2017 3.98 (H)  11/10/2016 3.97 (H)  08/12/2016 3.11 (H)  08/10/2016 3.26 (H)  08/09/2016 3.32 (H)  ]  ROS NSAIDS: No identified exposure IV Contrast no exposure TMP/SMX no exposure Hypotension not present Balance of 12 systems is negative w/ exceptions as above  PMH  Past Medical History:  Diagnosis Date  . Anemia   . Arthritis   . Cancer ([place]) 2015-present   CLL  . Chronic kidney disease   . Coronary artery disease 04/2016   1 stent  .  Diabetes mellitus    insulin dependent   . Enlarged prostate   . Headache   . Hypertension   . Pneumonia    PSH  Past Surgical History:  Procedure Laterality Date  . AV FISTULA PLACEMENT Left 04/28/2017   Procedure: Left arm ARTERIOVENOUS (AV) FISTULA CREATION;  Surgeon: Angelia Mould, MD;  Location: [place];  Service: Vascular;  Laterality: Left;  . CARDIAC CATHETERIZATION N/A 05/01/2015   Procedure: Left Heart Cath and Coronary Angiography;  Surgeon: Adrian Prows, MD;  Location: [place] [facility];  Service: Cardiovascular;  Laterality: N/A;  . CARDIAC CATHETERIZATION  05/01/2015   Procedure: Intravascular Pressure Wire/FFR Study;  Surgeon: Adrian Prows, MD;  Location: [place] [facility];  Service: Cardiovascular;;  . CARDIAC CATHETERIZATION N/A 05/22/2015   Procedure: Left Heart Cath and Coronary Angiography;  Surgeon: Adrian Prows, MD;  Location: [place] [facility];  Service: Cardiovascular;  Laterality: N/A;  . CARDIAC CATHETERIZATION N/A 05/22/2015   Procedure: Coronary Stent Intervention;  Surgeon: Adrian Prows, MD;  Location: [place] [facility];  Service: Cardiovascular;  Laterality: N/A;  . COLONOSCOPY W/ BIOPSIES AND POLYPECTOMY    . SHOULDER SURGERY    . TONSILLECTOMY     FH  Family History  Problem Relation Age of Onset  . Hypertension Mother   . Heart disease Sister   . Stroke Sister   . Diabetes Brother    [facility]  reports that he has quit smoking. His smoking use included cigarettes. He smoked 2.00 packs per day. He has never used smokeless tobacco. He reports that he does not drink alcohol or use drugs. Allergies No Known Allergies Home medications Prior to Admission medications   Medication Sig Start Date End Date Taking? Authorizing Provider  ALPHAGAN P 0.1 % SOLN Place 2 drops 2 (two) times daily into both eyes.    Yes [provider]  amLODipine (NORVASC) 5 MG tablet Take 1 tablet (5 mg total) by mouth daily. 04/25/19 07/24/19 Yes Miquel Dunn, NP   Ascorbic Acid (VITAMIN C) 1000 MG tablet Take 1,000 mg by mouth daily.   Yes [provider]  aspirin EC 81 MG tablet Take 81 mg by mouth at bedtime.    Yes [provider]  atorvastatin (LIPITOR) 20 MG tablet TAKE 1 TABLET BY MOUTH DAILY Patient taking differently: Take 20 mg by mouth daily.  04/15/19  Yes Miquel Dunn, NP  carvedilol (COREG) 25 MG tablet Take 25 mg by mouth 2 (two) times daily.    Yes [provider]  cholecalciferol (VITAMIN D) 1000 units tablet Take 1,000 Units by mouth daily.    Yes [provider]  cloNIDine (CATAPRES) 0.2 MG tablet Take 0.2 mg by mouth 2 (two) times daily.    Yes [provider]  CONTOUR NEXT TEST test strip USE AS DIRECTED TO CHECK BLOOD SUGAR TWICE A DAY Patient taking differently: 1 each by Other route 2 (two) times daily.  06/01/19  Yes Elayne Snare, MD  diphenhydramine-acetaminophen (TYLENOL PM) 25-500 MG TABS tablet Take 2 tablets by mouth at bedtime.   Yes [provider]  ferrous sulfate 325 (65 FE) MG tablet Take 325 mg by mouth 2 (two) times daily.    Yes [provider]  finasteride (PROSCAR) 5 MG tablet Take 5 mg by mouth every evening.    Yes [provider]  furosemide (LASIX) 80 MG tablet Take 40 mg by mouth daily.    Yes [provider]  hydrALAZINE (APRESOLINE) 100 MG tablet Take 100 mg by mouth 3 (three) times daily.    Yes [provider]  insulin NPH Human (HUMULIN N,NOVOLIN N) 100 UNIT/ML injection Inject 22 Units into the skin at bedtime. Inject 22 units under the skin every night.   Yes [provider]  isosorbide dinitrate (ISORDIL) 30 MG tablet Take 1 tablet (30 mg total) by mouth 3 (three) times daily. 06/09/19  Yes Adrian Prows, MD  Multiple Vitamin (MULTIVITAMIN WITH MINERALS) TABS tablet Take 1 tablet by mouth daily.   Yes [provider]  nitroGLYCERIN (NITROSTAT) 0.4 MG SL tablet Place 1 tablet (0.4 mg total) under  the tongue every 5 (five) minutes as needed for chest pain. 03/18/19 07/07/19 Yes Miquel Dunn, NP  NOVOLIN R 100 UNIT/ML injection INJECT 4-7 UNITS SUBCUTANEOUS. 5 UNITS EVERY MORNING AND 7 UNITS AT [facility] Patient taking differently: Inject 5-7 Units into the skin See admin instructions. 5 units in the mornings and 7 units at dinner 06/16/19  Yes Elayne Snare, MD  Omega-3 Fatty Acids (FISH OIL) 1000 MG CAPS Take 1,000 mg by mouth 2 (two) times daily.   Yes [provider]  ranolazine (RANEXA) 500 MG 12 hr tablet Take 2 tablets (1,000 mg total) by mouth 2 (two) times daily. 05/11/19  Yes Miquel Dunn, NP  tamsulosin (FLOMAX) 0.4 MG CAPS capsule Take 0.4 mg by mouth daily after supper.  Yes [provider]  Travoprost, BAK Free, (TRAVATAN) 0.004 % SOLN ophthalmic solution Place 1 drop into both eyes at bedtime.   Yes [provider]  triamcinolone cream (KENALOG) 0.1 % Apply 1 application topically 2 (two) times daily as needed (for itching/irritation).    Yes [provider]    Current Medications Scheduled Meds: . amLODipine  5 mg Oral Daily  . aspirin EC  81 mg Oral QHS  . atorvastatin  20 mg Oral Daily  . [START ON 07/08/2019] azithromycin  500 mg Oral Daily  . carvedilol  25 mg Oral BID  . cloNIDine  0.2 mg Oral BID  . furosemide  40 mg Intravenous BID  . hydrALAZINE  100 mg Oral TID  . insulin aspart  0-9 Units Subcutaneous TID WC  . isosorbide dinitrate  30 mg Oral TID  . ranolazine  1,000 mg Oral BID   Continuous Infusions: . cefTRIAXone (ROCEPHIN)  IV    . [START ON 07/08/2019] cefTRIAXone (ROCEPHIN)  IV    . heparin 1,250 Units/hr (07/07/19 1227)   PRN Meds:.acetaminophen **OR** acetaminophen, ondansetron **OR** ondansetron (ZOFRAN) IV  CBC Recent Labs  Lab 07/07/19 1057  WBC 16.8*  NEUTROABS 11.3*  HGB 8.5*  HCT 26.2*  MCV 99.2  PLT 476   Basic Metabolic Panel Recent Labs  Lab 07/07/19 1057  NA 138  K 4.9   CL 111  CO2 15*  GLUCOSE 97  BUN 98*  CREATININE 6.61*  CALCIUM 9.2    Physical Exam  Blood pressure (!) 171/70, pulse 81, temperature (!) 100.6 F (38.1 C), temperature source Rectal, resp. rate (!) 26, height 6\' 3"  (1.905 m), SpO2 95 %. GEN: Elderly male, appears uncomfortable, has a cough ENT: NCAT EYES: EOMI CV: Regular, no rub PULM: Coarse breath sounds bilaterally ABD: Soft, nontender SKIN: No rashes or lesions EXT: 3+ pitting edema extinguishing at the knees Left radiocephalic AV fistula with bruit and thrill, mature  Assessment 35M CKD5 presenting with fever, cough, SOB, acute HTN, EKG chagnes concerning for NSTEMI, and worry for uremia.  1. CKD 5, he has asterixis and hyperkalemia and presents with potential pulmonary edema, though he could have a pneumonia or primary cardiac process.  Given the worsening in his GFR I think it is very reasonable to move forward initiate dialysis and I recommended that to him this evening.  He wishes to consider it.  There is no emergent indication to start at this time.  His fistula is ready for use.  We will reevaluate tomorrow and discuss further. 2. Shortness of breath, cough, fever; likely infectious but also concern for NSTEMI.  On antibiotics per TRH.  Cardiology to see 3. Anemia, hemoglobin is 8.5 stable at the current time, likely will need ESA 4. Metabolic acidosis, moderate, continue to follow and based upon treatment of #1, would not give bicarbonate therapy at this time 5. CLL in remission 6. DM2 7. Acute hypertension  Plan 1. As above, I have recommended initiation of dialysis I think he is approaching ESRD.  He wishes to consider this and we will discuss further. 2. Resume home blood pressure medications at direction of cardiology 3. Daily weights, Daily Renal Panel, Strict I/Os, Avoid nephrotoxins (NSAIDs, judicious IV Contrast)  4. Will follow closely   [name]  [phone] pgr 07/07/2019, 5:32 PM

## 2019-07-08 ENCOUNTER — Telehealth: Payer: Self-pay | Admitting: Cardiology

## 2019-07-08 ENCOUNTER — Inpatient Hospital Stay (HOSPITAL_COMMUNITY): Payer: Medicare Other

## 2019-07-08 DIAGNOSIS — R609 Edema, unspecified: Secondary | ICD-10-CM

## 2019-07-08 DIAGNOSIS — R7989 Other specified abnormal findings of blood chemistry: Secondary | ICD-10-CM

## 2019-07-08 DIAGNOSIS — J189 Pneumonia, unspecified organism: Secondary | ICD-10-CM

## 2019-07-08 LAB — CBC
HCT: 24.9 % — ABNORMAL LOW (ref 39.0–52.0)
Hemoglobin: 8.1 g/dL — ABNORMAL LOW (ref 13.0–17.0)
MCH: 31.5 pg (ref 26.0–34.0)
MCHC: 32.5 g/dL (ref 30.0–36.0)
MCV: 96.9 fL (ref 80.0–100.0)
Platelets: 277 10*3/uL (ref 150–400)
RBC: 2.57 MIL/uL — ABNORMAL LOW (ref 4.22–5.81)
RDW: 13.4 % (ref 11.5–15.5)
WBC: 19 10*3/uL — ABNORMAL HIGH (ref 4.0–10.5)
nRBC: 0 % (ref 0.0–0.2)

## 2019-07-08 LAB — RESPIRATORY PANEL BY PCR

## 2019-07-08 LAB — RENAL FUNCTION PANEL
Albumin: 3.1 g/dL — ABNORMAL LOW (ref 3.5–5.0)
Anion gap: 13 (ref 5–15)
BUN: 95 mg/dL — ABNORMAL HIGH (ref 8–23)
CO2: 16 mmol/L — ABNORMAL LOW (ref 22–32)
Calcium: 9.2 mg/dL (ref 8.9–10.3)
Chloride: 107 mmol/L (ref 98–111)
Creatinine, Ser: 5.83 mg/dL — ABNORMAL HIGH (ref 0.61–1.24)
GFR calc Af Amer: 10 mL/min — ABNORMAL LOW (ref >60)
GFR calc non Af Amer: 8 mL/min — ABNORMAL LOW (ref >60)
Glucose, Bld: 131 mg/dL — ABNORMAL HIGH (ref 70–99)
Phosphorus: 4.2 mg/dL (ref 2.5–4.6)
Potassium: 4.3 mmol/L (ref 3.5–5.1)
Sodium: 136 mmol/L (ref 135–145)

## 2019-07-08 LAB — HEPARIN LEVEL (UNFRACTIONATED)
Heparin Unfractionated: 0.66 IU/mL (ref 0.30–0.70)
Heparin Unfractionated: 0.56 IU/mL (ref 0.30–0.70)

## 2019-07-08 LAB — GLUCOSE, CAPILLARY
Glucose-Capillary: 142 mg/dL — ABNORMAL HIGH (ref 70–99)
Glucose-Capillary: 133 mg/dL — ABNORMAL HIGH (ref 70–99)
Glucose-Capillary: 140 mg/dL — ABNORMAL HIGH (ref 70–99)
Glucose-Capillary: 185 mg/dL — ABNORMAL HIGH (ref 70–99)

## 2019-07-08 LAB — BRAIN NATRIURETIC PEPTIDE: B Natriuretic Peptide: 935.9 pg/mL — ABNORMAL HIGH (ref 0.0–100.0)

## 2019-07-08 MED ORDER — SODIUM BICARBONATE 650 MG PO TABS
1300.0000 mg | ORAL_TABLET | Freq: Two times a day (BID) | ORAL | Status: DC
  Administered 2019-07-08 – 2019-07-09 (×3): 1300 mg via ORAL
  Filled 2019-07-08 (×3): qty 2

## 2019-07-08 MED ORDER — HEPARIN SODIUM (PORCINE) 5000 UNIT/ML IJ SOLN
5000.0000 [IU] | Freq: Three times a day (TID) | INTRAMUSCULAR | Status: DC
  Administered 2019-07-08: 5000 [IU] via SUBCUTANEOUS
  Filled 2019-07-08: qty 1

## 2019-07-08 MED ORDER — TICAGRELOR 90 MG PO TABS
90.0000 mg | ORAL_TABLET | Freq: Two times a day (BID) | ORAL | Status: DC
  Administered 2019-07-08 – 2019-07-10 (×5): 90 mg via ORAL
  Filled 2019-07-08 (×5): qty 1

## 2019-07-08 NOTE — Progress Notes (Addendum)
Subjective:  Still has cough, dyspnea is better  Intake/Output from previous day:  I/O last 3 completed shifts: In: 714 [P.O.:480; I.V.:234] Out: 700 [Urine:700] No intake/output data recorded.  Blood pressure (!) 162/62, pulse 67, temperature 98.4 F (36.9 C), temperature source Oral, resp. rate 13, height 6' 3" (1.905 m), weight 99 kg, SpO2 100 %. Physical Exam  Constitutional: He is oriented to person, place, and time. Vital signs are normal. He appears well-developed and well-nourished. He appears distressed.  Mildly obese  HENT:  Head: Normocephalic and atraumatic.  Cardiovascular: Normal rate, regular rhythm, normal heart sounds and intact distal pulses.  Pulses:      Carotid pulses are on the right side with bruit.      Femoral pulses are 2+ on the right side with bruit and 2+ on the left side.      Popliteal pulses are 2+ on the right side and 2+ on the left side.       Dorsalis pedis pulses are 1+ on the right side and 1+ on the left side.       Posterior tibial pulses are 2+ on the right side and 2+ on the left side.  Bilateral 2+ pitting edema  Pulmonary/Chest: Effort normal. No accessory muscle usage. No respiratory distress. He has rales (scattered bilateral).  Abdominal: Soft. Bowel sounds are normal.  Obese and pannus present  Musculoskeletal:        General: Normal range of motion.     Cervical back: Normal range of motion.  Neurological: He is alert and oriented to person, place, and time.  Skin: Skin is warm and dry.  Vitals reviewed.   Lab Results: BMP BNP (last 3 results) Recent Labs    07/07/19 1057 07/08/19 0336  BNP 360.3* 935.9*    ProBNP (last 3 results) No results for input(s): PROBNP in the last 8760 hours. BMP Latest Ref Rng & Units 07/08/2019 07/07/2019 03/10/2019  Glucose 70 - 99 mg/dL 131(H) 97 137(H)  BUN 8 - 23 mg/dL 95(H) 98(H) 85(H)  Creatinine 0.61 - 1.24 mg/dL 5.83(H) 6.61(H) 4.35(HH)  Sodium 135 - 145 mmol/L 136 138 134(L)   Potassium 3.5 - 5.1 mmol/L 4.3 4.9 4.8  Chloride 98 - 111 mmol/L 107 111 108  CO2 22 - 32 mmol/L 16(L) 15(L) 16(L)  Calcium 8.9 - 10.3 mg/dL 9.2 9.2 8.9   Hepatic Function Latest Ref Rng & Units 07/08/2019 07/07/2019 03/10/2019  Total Protein 6.5 - 8.1 g/dL - 6.4(L) 6.0(L)  Albumin 3.5 - 5.0 g/dL 3.1(L) 3.5 3.4(L)  AST 15 - 41 U/L - 13(L) 13(L)  ALT 0 - 44 U/L - 14 15  Alk Phosphatase 38 - 126 U/L - 61 64  Total Bilirubin 0.3 - 1.2 mg/dL - 0.6 0.3   CBC Latest Ref Rng & Units 07/08/2019 07/07/2019 03/10/2019  WBC 4.0 - 10.5 K/uL 19.0(H) 16.8(H) 8.3  Hemoglobin 13.0 - 17.0 g/dL 8.1(L) 8.5(L) 8.4(L)  Hematocrit 39.0 - 52.0 % 24.9(L) 26.2(L) 26.2(L)  Platelets 150 - 400 K/uL 277 248 170   Lipid Panel     Component Value Date/Time   CHOL 115 03/15/2018 0917   TRIG 71 07/07/2019 1057   HDL 33.20 (L) 03/15/2018 0917   CHOLHDL 3 03/15/2018 0917   VLDL 19.4 03/15/2018 0917   LDLCALC 62 03/15/2018 0917   LDLDIRECT 60.0 05/12/2017 0909   Cardiac Panel (last 3 results) No results for input(s): CKTOTAL, CKMB, TROPONINI, RELINDX in the last 72 hours.  HEMOGLOBIN A1C Lab Results  Component Value Date   HGBA1C 5.4 07/07/2019   MPG 108.28 07/07/2019   TSH No results for input(s): TSH in the last 8760 hours. Imaging: DG Chest Port 1 View  Result Date: 07/07/2019 CLINICAL DATA:  Shortness of breath, cough EXAM: PORTABLE CHEST 1 VIEW COMPARISON:  06/07/2017 FINDINGS: Heart size is mildly enlarged. Calcific aortic knob. Mildly prominent bibasilar interstitial markings. No pleural effusion or pneumothorax. IMPRESSION: Mild cardiomegaly with mild bibasilar interstitial prominence, could represent atelectasis, mild interstitial edema or atypical infection. Electronically Signed   By: Davina Poke M.D.   On: 07/07/2019 11:39    Scheduled Meds: . amLODipine  5 mg Oral Daily  . aspirin EC  81 mg Oral QHS  . atorvastatin  20 mg Oral Daily  . azithromycin  500 mg Oral Daily  . carvedilol   25 mg Oral BID  . cloNIDine  0.2 mg Oral BID  . furosemide  80 mg Intravenous TID  . hydrALAZINE  100 mg Oral TID  . insulin aspart  0-9 Units Subcutaneous TID WC  . isosorbide dinitrate  30 mg Oral TID  . ranolazine  1,000 mg Oral BID   Continuous Infusions: . cefTRIAXone (ROCEPHIN)  IV    . heparin 1,450 Units/hr (07/08/19 0546)   PRN Meds:.acetaminophen **OR** acetaminophen, ondansetron **OR** ondansetron (ZOFRAN) IV   Cardiac Studies:   Coronary Angiogram [05/22/2015]: PCI mid LAD with 3.5x26 mm Resolute DES. Periprocedural MI with septal branch occlusion.  Lower Extremity Dopplers [03/22/2015]: No hemodynamically significant stenoses are identified in the right lower extremity arterial system. Bilateral deep formal arteries show biphasic waveforms with < 50% stenosis. Moderate velocity increase at the left posterior tibial artery suggestive of >50% stenosis with diffuse disease in the AT. This exam reveals normal perfusion of both the lower extremities with ABI of 1.01.  Lexiscan Myoview Stress Test 03/30/19 Resting EKG the onset normal sinus rhythm, nonspecific T abnormality. Stress EKG is non-diagnostic as a pharmacologic stress test using Lexiscan. The t.i.d. index is 0.86. Left ventricle is dilated both at rest and stress images at 281 mL. Perfusion images reveal a large sized inferior, inferoseptal non-transmural myocardial infarction with very minimal peri-infarct ischemia. Left ventricle systolic function Evaluated by QGS was moderately depressed at 37% with global hypokinesis. This is a high risk study. No significant change compared to the report of nuclear stress test on 12/19/2016 although ischemia was not evident previously.  Echocardiogram 03/22/2019: Left ventricle cavity is mildly dilated. Moderate concentric hypertrophy of the left ventricle. Left ventricle regional wall motion findings: Basal anteroseptal and Mid anteroseptal hypokinesis. Moderately depressed  LV systolic function with EF 35%. Doppler evidence of grade II (pseudonormal) diastolic dysfunction, elevated LAP.  Left atrial cavity is severely dilated. Trileaflet aortic valve. Trace aortic regurgitation. Moderate (Grade III) mitral regurgitation. Moderate tricuspid regurgitation. Estimated pulmonary artery systolic pressure is 54 mmHg.  11/03/2017, there is interval worsening of systolic and diastolic function, valvular regurgitation, and pulmonary hypertension.  Carotid artery duplex 05/10/2019: Stenosis in the right external carotid artery (<50%). Stenosis in the left internal carotid artery (50-69%). Stenosis in the left external carotid artery (<50%). Antegrade right vertebral artery flow. Antegrade left vertebral artery flow. Compared to the study done on 04/30/2018, left ICA stenosis severity has progressed from less than 50%. Follow up in six months is appropriate if clinically indicated.  Assessment/Plan:  1.  NSTEMI 2.  Coronary artery disease of the native vessel with unstable angina pectoris, stent to the mid LAD in 2016.  Abnormal high  risk nuclear stress test on 03/31/2019. 3.  Acute on chronic systolic and diastolic heart failure 4.  Acute on chronic kidney disease stage IV to V  EKG 07/07/2019: Normal sinus rhythm at rate of 87 bpm, normal axis, LVH.  3 mm horizontal ST depression in the inferior and lateral leads, subendocardial ischemia versus infarct.  Compared to previous EKG, ST changes are new.  Abnormal EKG.  Recommendation: Patient had an episode of chest discomfort last night which immediately relieved with nitroglycerin.  He is still on IV heparin.  I do not plan on performing cardiac catheterization in view of underlying possible pneumonia, patient is still coughing yellowish-green sputum, feels warm and feverish, also there is worsening renal function.  Until dialysis has been discussed, would hold off on coronary angiography.  Will discontinue IV heparin,  start the patient on Brilinta for ACS for medical management.  Is presently on otherwise appropriate medical therapy.  I had increased the dose of furosemide to 80 mg 3 times daily, 3 doses have been ordered, await up on primary team and nephrology to evaluate the dosage of the same.  Lower extremity venous duplex negative for DVT.  We'll change IV heparin to DVT dose.  He is also severely anemic, may be related to end-stage renal disease. ? Aranesp.   Adrian Prows, M.D. 07/08/2019, 8:44 AM [facility] Pager: [phone] Office: [phone] If no answer: [phone]

## 2019-07-08 NOTE — Progress Notes (Signed)
[facility] NEPHROLOGY PROGRESS NOTE  Assessment/ Plan: Pt is a 83 y.o. yo male with history of CAD, CLL in remission, DM 2, CKD 5 follows with Dr. Posey Pronto, baseline creatinine level around 5.6, has a left UE AV fistula, admitted with a cough, fever and shortness of breath  #CKD stage V: With some fluid overload.  Currently on IV Lasix 80 mg 3 times daily.  Urine output marginal however patient has no uremic symptoms today in room air.  He understand that he is nearing to require dialysis however he is hesitant to start during this time.  The AV fistula looks a little deep on exam, certainly can try during HD.  #Committee acquired pneumonia: On antibiotics per primary team.    #Anemia of CKD: Check iron studies.  May need ESA.  #Metabolic acidosis: Start sodium bicarbonate.  #Hypertension: Continue current medication.  Diuretics for volume management as above.  #Acute on chronic CHF, non-STEMI: Currently on IV heparin.  Cardiology is following.  Subjective: Seen and examined at bedside.  Patient reports much better today.  He denies shortness of breath, cough, chest  pain, nausea or vomiting.  Urine output is only recorded 700 cc.  He is hesitant to start dialysis. Objective Vital signs in last 24 hours: Vitals:   07/07/19 2342 07/08/19 0411 07/08/19 0700 07/08/19 0911  BP:  (!) 162/62  (!) 167/61  Pulse: 66 67  67  Resp: 17 13  15   Temp: 99 F (37.2 C) 98.4 F (36.9 C)  98.7 F (37.1 C)  TempSrc:  Oral  Oral  SpO2: 100% 100%  100%  Weight:   99 kg   Height:       Weight change:   Intake/Output Summary (Last 24 hours) at 07/08/2019 1117 Last data filed at 07/08/2019 0900 Gross per 24 hour  Intake 714 ml  Output 925 ml  Net -211 ml       Labs: Basic Metabolic Panel: Recent Labs  Lab 07/07/19 1057 07/08/19 0336  NA 138 136  K 4.9 4.3  CL 111 107  CO2 15* 16*  GLUCOSE 97 131*  BUN 98* 95*  CREATININE 6.61* 5.83*  CALCIUM 9.2 9.2  PHOS  --  4.2    Liver Function Tests: Recent Labs  Lab 07/07/19 1057 07/08/19 0336  AST 13*  --   ALT 14  --   ALKPHOS 61  --   BILITOT 0.6  --   PROT 6.4*  --   ALBUMIN 3.5 3.1*   No results for input(s): LIPASE, AMYLASE in the last 168 hours. No results for input(s): AMMONIA in the last 168 hours. CBC: Recent Labs  Lab 07/07/19 1057 07/08/19 0336  WBC 16.8* 19.0*  NEUTROABS 11.3*  --   HGB 8.5* 8.1*  HCT 26.2* 24.9*  MCV 99.2 96.9  PLT 248 277   Cardiac Enzymes: No results for input(s): CKTOTAL, CKMB, CKMBINDEX, TROPONINI in the last 168 hours. CBG: Recent Labs  Lab 07/07/19 1050 07/07/19 1851 07/07/19 2106 07/08/19 0634 07/08/19 1111  GLUCAP 87 120* 131* 142* 133*    Iron Studies:  Recent Labs    07/07/19 1057  FERRITIN 420*   Studies/Results: DG Chest Port 1 View  Result Date: 07/07/2019 CLINICAL DATA:  Shortness of breath, cough EXAM: PORTABLE CHEST 1 VIEW COMPARISON:  06/07/2017 FINDINGS: Heart size is mildly enlarged. Calcific aortic knob. Mildly prominent bibasilar interstitial markings. No pleural effusion or pneumothorax. IMPRESSION: Mild cardiomegaly with mild bibasilar interstitial prominence, could represent atelectasis,  mild interstitial edema or atypical infection. Electronically Signed   By: Davina Poke M.D.   On: 07/07/2019 11:39    Medications: Infusions: . cefTRIAXone (ROCEPHIN)  IV    . heparin 1,450 Units/hr (07/08/19 0546)    Scheduled Medications: . amLODipine  5 mg Oral Daily  . aspirin EC  81 mg Oral QHS  . atorvastatin  20 mg Oral Daily  . azithromycin  500 mg Oral Daily  . carvedilol  25 mg Oral BID  . cloNIDine  0.2 mg Oral BID  . furosemide  80 mg Intravenous TID  . hydrALAZINE  100 mg Oral TID  . insulin aspart  0-9 Units Subcutaneous TID WC  . isosorbide dinitrate  30 mg Oral TID  . ranolazine  1,000 mg Oral BID    have reviewed scheduled and prn medications.  Physical Exam: General:NAD, comfortable Heart:RRR, s1s2  nl Lungs:clear b/l, no crackle Abdomen:soft, Non-tender, non-distended Extremities:No edema Dialysis Access: Left AV fistula has good thrill and bruit, looks slightly deep to me.  Dron Prasad Bhandari 07/08/2019,11:17 AM  LOS: 1 day  Pager: [phone]

## 2019-07-08 NOTE — Progress Notes (Signed)
LE venous duplex       has been completed. Preliminary results can be found under CV proc through chart review. Maurene Hollin, BS, RDMS, RVT   

## 2019-07-08 NOTE — Progress Notes (Signed)
PROGRESS NOTE    CHALMER ZHENG  OEU:[id number] DOB: [birth year] DOA: 07/07/2019 PCP: [name], MD    Brief Narrative:  83 year old gentleman with history of hypertension, chronic kidney disease stage IV nearing hemodialysis, coronary artery disease, recent abnormal stress test, type 2 diabetes and CLL presented to the emergency room with shortness of breath, cough with blood-tinged sputum since morning.  He had some atypical chest pain.  Recently with weight gain and extremity edema.  He does have left AV arm AV fistula that has not been used yet. In the emergency room, febrile with temperature 100.6, blood pressures elevated, 2 L nasal cannula placed for comfort.  WBC 16.8.  Creatinine 6.61.  EKG revealed ST depression in inferior lateral leads.  Mildly elevated troponins.  Chest x-ray with fluid in the fissures, suspected atelectasis versus pneumonia. COVID-19 point-of-care and PCR test negative.  Admitted with IV antibiotics and heparin infusion with cardiology and nephrology consult.   Assessment & Plan:   Principal Problem:   Sepsis ([place]) Active Problems:   CLL (chronic lymphocytic leukemia) (HCC)   Type 2 diabetes, HbA1c goal < 7% (HCC)   Acute renal failure superimposed on chronic kidney disease (HCC)   Acute on chronic combined systolic and diastolic CHF (congestive heart failure) (HCC)   Hypertensive urgency   Urinary tract infection   Abnormal EKG   Elevated troponin  Sepsis present on admission without endorgan damage: Community-acquired pneumonia versus UTI.  Urine cultures growing more than 100,000 gram-negative colonies.  Blood cultures negative so far.  Clinically improving. Agree with continuing monitoring and antibiotic management with Rocephin and azithromycin that should cover for both pneumonia and UTI. Chest physiotherapy, incentive spirometry, deep breathing exercises, sputum induction, mucolytic's and bronchodilators. Supplemental oxygen to keep  saturations more than 90%.  Non-STEMI: Followed by cardiology.  Recommended medical management.  On aspirin.  Currently on heparin infusion.  Patient is already on nitrates, hydralazine and beta-blockers.  Is also on Ranexa.  Not anticipating cardiac cath at this time.  Acute on chronic combined heart failure: With evidence of fluid overload.  Known ejection fraction 35. As per cardiology recommendation, on high volume diuresis, intake output monitoring.  Acute renal failure superimposed on chronic kidney disease stage IV: Followed closely by nephrology in the hospital.  Making urine.  No indication for emergent hemodialysis.  Intake output monitoring.  Diuresis.  May end up starting on hemodialysis in the hospital.  Hypertension: Blood pressures elevated.  Adjusting medications including amlodipine hydralazine nitrates beta-blockers and clonidine.  CLL: Is stable.   DVT prophylaxis: Heparin infusion Code Status: Full code Family Communication: None Disposition Plan: Pending clinical improvement   Consultants:   Cardiology, Dr. Einar Gip  Nephrology  Procedures:   None  Antimicrobials:   Rocephin, azithromycin, 07/07/2019---   Subjective: Patient seen and examined.  Afebrile overnight.  Mostly on room air.  Has some congestion but denies any sputum production.  No more hemoptysis.  No more chest pain, however he has not ambulated. 700 mL urine overnight.  Objective: Vitals:   07/07/19 2342 07/08/19 0411 07/08/19 0700 07/08/19 0911  BP:  (!) 162/62  (!) 167/61  Pulse: 66 67  67  Resp: 17 13  15   Temp: 99 F (37.2 C) 98.4 F (36.9 C)  98.7 F (37.1 C)  TempSrc:  Oral  Oral  SpO2: 100% 100%  100%  Weight:   99 kg   Height:        Intake/Output Summary (Last 24 hours) at 07/08/2019  [place] filed at 07/08/2019 0900 Gross per 24 hour  Intake 714 ml  Output 925 ml  Net -211 ml   Filed Weights   07/08/19 0700  Weight: 99 kg    Examination:  General  exam: Appears calm and comfortable, on room air. Respiratory system: Clear to auscultation. Respiratory effort normal.  No added sounds Cardiovascular system: S1 & S2 heard, RRR. No JVD, murmurs, rubs, gallops or clicks.  Trace bilateral pedal edema.. Gastrointestinal system: Abdomen is nondistended, soft and nontender. No organomegaly or masses felt. Normal bowel sounds heard. Central nervous system: Alert and oriented. No focal neurological deficits. Extremities: Symmetric 5 x 5 power. Skin: No rashes, lesions or ulcers, left arm AV fistula with thrill. Psychiatry: Judgement and insight appear normal. Mood & affect appropriate.     Data Reviewed: I have personally reviewed following labs and imaging studies  CBC: Recent Labs  Lab 07/07/19 1057 07/08/19 0336  WBC 16.8* 19.0*  NEUTROABS 11.3*  --   HGB 8.5* 8.1*  HCT 26.2* 24.9*  MCV 99.2 96.9  PLT 248 350   Basic Metabolic Panel: Recent Labs  Lab 07/07/19 1057 07/08/19 0336  NA 138 136  K 4.9 4.3  CL 111 107  CO2 15* 16*  GLUCOSE 97 131*  BUN 98* 95*  CREATININE 6.61* 5.83*  CALCIUM 9.2 9.2  PHOS  --  4.2   GFR: Estimated Creatinine Clearance: 11.9 mL/min (A) (by C-G formula based on SCr of 5.83 mg/dL (H)). Liver Function Tests: Recent Labs  Lab 07/07/19 1057 07/08/19 0336  AST 13*  --   ALT 14  --   ALKPHOS 61  --   BILITOT 0.6  --   PROT 6.4*  --   ALBUMIN 3.5 3.1*   No results for input(s): LIPASE, AMYLASE in the last 168 hours. No results for input(s): AMMONIA in the last 168 hours. Coagulation Profile: No results for input(s): INR, PROTIME in the last 168 hours. Cardiac Enzymes: No results for input(s): CKTOTAL, CKMB, CKMBINDEX, TROPONINI in the last 168 hours. BNP (last 3 results) No results for input(s): PROBNP in the last 8760 hours. HbA1C: Recent Labs    07/07/19 1650  HGBA1C 5.4   CBG: Recent Labs  Lab 07/07/19 1050 07/07/19 1851 07/07/19 2106 07/08/19 0634 07/08/19 1111  GLUCAP  87 120* 131* 142* 133*   Lipid Profile: Recent Labs    07/07/19 1057  TRIG 71   Thyroid Function Tests: No results for input(s): TSH, T4TOTAL, FREET4, T3FREE, THYROIDAB in the last 72 hours. Anemia Panel: Recent Labs    07/07/19 1057  FERRITIN 420*   Sepsis Labs: Recent Labs  Lab 07/07/19 1057 07/07/19 1756  PROCALCITON 0.13  --   LATICACIDVEN 1.0 1.1    Recent Results (from the past 240 hour(s))  Respiratory Panel by RT PCR (Flu A&B, Covid) - Nasopharyngeal Swab     Status: None   Collection Time: 07/07/19  1:47 PM   Specimen: Nasopharyngeal Swab  Result Value Ref Range Status   SARS Coronavirus 2 by RT PCR NEGATIVE NEGATIVE Final    Comment: (NOTE) SARS-CoV-2 target nucleic acids are NOT DETECTED. The SARS-CoV-2 RNA is generally detectable in upper respiratoy specimens during the acute phase of infection. The lowest concentration of SARS-CoV-2 viral copies this assay can detect is 131 copies/mL. A negative result does not preclude SARS-Cov-2 infection and should not be used as the sole basis for treatment or other patient management decisions. A negative result may occur with  improper specimen collection/handling, submission of specimen other than nasopharyngeal swab, presence of viral mutation(s) within the areas targeted by this assay, and inadequate number of viral copies (<131 copies/mL). A negative result must be combined with clinical observations, patient history, and epidemiological information. The expected result is Negative. Fact Sheet for Patients:  PinkCheek.be Fact Sheet for Healthcare Providers:  GravelBags.it This test is not yet ap proved or cleared by the [place] FDA and  has been authorized for detection and/or diagnosis of SARS-CoV-2 by FDA under an Emergency Use Authorization (EUA). This EUA will remain  in effect (meaning this test can be used) for the duration of the COVID-19  declaration under Section 564(b)(1) of the Act, 21 U.S.C. section 360bbb-3(b)(1), unless the authorization is terminated or revoked sooner.    Influenza A by PCR NEGATIVE NEGATIVE Final   Influenza B by PCR NEGATIVE NEGATIVE Final    Comment: (NOTE) The Xpert Xpress SARS-CoV-2/FLU/RSV assay is intended as an aid in  the diagnosis of influenza from Nasopharyngeal swab specimens and  should not be used as a sole basis for treatment. Nasal washings and  aspirates are unacceptable for Xpert Xpress SARS-CoV-2/FLU/RSV  testing. Fact Sheet for Patients: PinkCheek.be Fact Sheet for Healthcare Providers: GravelBags.it This test is not yet approved or cleared by the [place] FDA and  has been authorized for detection and/or diagnosis of SARS-CoV-2 by  FDA under an Emergency Use Authorization (EUA). This EUA will remain  in effect (meaning this test can be used) for the duration of the  Covid-19 declaration under Section 564(b)(1) of the Act, 21  U.S.C. section 360bbb-3(b)(1), unless the authorization is  terminated or revoked. Performed at [facility], [place] [address], [place], [place] [address]   Respiratory Panel by PCR     Status: None   Collection Time: 07/07/19  1:47 PM   Specimen: Nasopharyngeal Swab; Respiratory  Result Value Ref Range Status   Adenovirus NOT DETECTED NOT DETECTED Final   Coronavirus 229E NOT DETECTED NOT DETECTED Final    Comment: (NOTE) The Coronavirus on the Respiratory Panel, DOES NOT test for the novel  Coronavirus (2019 nCoV)    Coronavirus HKU1 NOT DETECTED NOT DETECTED Final   Coronavirus NL63 NOT DETECTED NOT DETECTED Final   Coronavirus OC43 NOT DETECTED NOT DETECTED Final   Metapneumovirus NOT DETECTED NOT DETECTED Final   Rhinovirus / Enterovirus NOT DETECTED NOT DETECTED Final   Influenza A NOT DETECTED NOT DETECTED Final   Influenza B NOT DETECTED NOT DETECTED Final    Parainfluenza Virus 1 NOT DETECTED NOT DETECTED Final   Parainfluenza Virus 2 NOT DETECTED NOT DETECTED Final   Parainfluenza Virus 3 NOT DETECTED NOT DETECTED Final   Parainfluenza Virus 4 NOT DETECTED NOT DETECTED Final   Respiratory Syncytial Virus NOT DETECTED NOT DETECTED Final   Bordetella pertussis NOT DETECTED NOT DETECTED Final   Chlamydophila pneumoniae NOT DETECTED NOT DETECTED Final   Mycoplasma pneumoniae NOT DETECTED NOT DETECTED Final    Comment: Performed at [facility], [place]. [address] [place], [place] [address]  Urine culture     Status: Abnormal (Preliminary result)   Collection Time: 07/07/19  2:13 PM   Specimen: Urine, Random  Result Value Ref Range Status   Specimen Description URINE, RANDOM  Final   Special Requests NONE  Final   Culture (A)  Final    >=100,000 COLONIES/mL ESCHERICHIA COLI SUSCEPTIBILITIES TO FOLLOW Performed at [facility], [place],  [place] [address]    Report Status PENDING  Incomplete         Radiology Studies: DG Chest Port 1 View  Result Date: 07/07/2019 CLINICAL DATA:  Shortness of breath, cough EXAM: PORTABLE CHEST 1 VIEW COMPARISON:  06/07/2017 FINDINGS: Heart size is mildly enlarged. Calcific aortic knob. Mildly prominent bibasilar interstitial markings. No pleural effusion or pneumothorax. IMPRESSION: Mild cardiomegaly with mild bibasilar interstitial prominence, could represent atelectasis, mild interstitial edema or atypical infection. Electronically Signed   By: Davina Poke M.D.   On: 07/07/2019 11:39        Scheduled Meds: . amLODipine  5 mg Oral Daily  . aspirin EC  81 mg Oral QHS  . atorvastatin  20 mg Oral Daily  . azithromycin  500 mg Oral Daily  . carvedilol  25 mg Oral BID  . cloNIDine  0.2 mg Oral BID  . furosemide  80 mg Intravenous TID  . hydrALAZINE  100 mg Oral TID  . insulin aspart  0-9 Units Subcutaneous TID WC  . isosorbide dinitrate  30 mg Oral TID  . ranolazine   1,000 mg Oral BID  . sodium bicarbonate  1,300 mg Oral BID   Continuous Infusions: . cefTRIAXone (ROCEPHIN)  IV    . heparin 1,450 Units/hr (07/08/19 0546)     LOS: 1 day    Time spent: 35 minutes    Barb Merino, MD [facility] Pager [phone]

## 2019-07-08 NOTE — Progress Notes (Addendum)
ANTICOAGULATION CONSULT NOTE - Follow Up Consult  Pharmacy Consult for Heparin IV Indication: chest pain/ACS  No Known Allergies  Patient Measurements: Height: 6\' 3"  (190.5 cm) Weight: 218 lb 4.8 oz (99 kg) IBW/kg (Calculated) : 84.5 Heparin Dosing Weight: 102.6 kg  Vital Signs: Temp: 98.7 F (37.1 C) (12/18 0911) Temp Source: Oral (12/18 0911) BP: 167/61 (12/18 0911) Pulse Rate: 67 (12/18 0911)  Labs: Recent Labs    07/07/19 1057 07/07/19 1344 07/07/19 1935 07/07/19 2210 07/08/19 0336  HGB 8.5*  --   --   --  8.1*  HCT 26.2*  --   --   --  24.9*  PLT 248  --   --   --  277  HEPARINUNFRC  --   --  0.27*  --  0.66  CREATININE 6.61*  --   --   --  5.83*  TROPONINIHS 32* 64* 101* 124*  --     Estimated Creatinine Clearance: 11.9 mL/min (A) (by C-G formula based on SCr of 5.83 mg/dL (H)).   Medications:  Infusions:  . cefTRIAXone (ROCEPHIN)  IV    . heparin 1,450 Units/hr (07/08/19 0546)    Assessment: 83 year old male presented with blood tinged sputum and bilateral LLE edema. EKG suggestive of ischemia. No anticoagulation prior to admission. Pharmacy was consulted for IV Heparin therapy for ACS.   Heparin level this morning within goal range- confirmed this afternoon.  No overt bleeding or complications noted.  Goal of Therapy:  Heparin level 0.3-0.7 units/ml Monitor platelets by anticoagulation protocol: Yes   Plan:  Confirm heparin level now. F/u plans for LOT heparin. Daily heparin level, CBC.  Marguerite Olea, [facility] Clinical Pharmacist Phone [phone]  07/08/2019 9:36 AM

## 2019-07-09 LAB — RENAL FUNCTION PANEL
Albumin: 2.7 g/dL — ABNORMAL LOW (ref 3.5–5.0)
Anion gap: 14 (ref 5–15)
BUN: 103 mg/dL — ABNORMAL HIGH (ref 8–23)
CO2: 17 mmol/L — ABNORMAL LOW (ref 22–32)
Calcium: 8.9 mg/dL (ref 8.9–10.3)
Chloride: 104 mmol/L (ref 98–111)
Creatinine, Ser: 5.94 mg/dL — ABNORMAL HIGH (ref 0.61–1.24)
GFR calc Af Amer: 9 mL/min — ABNORMAL LOW (ref >60)
GFR calc non Af Amer: 8 mL/min — ABNORMAL LOW (ref >60)
Glucose, Bld: 151 mg/dL — ABNORMAL HIGH (ref 70–99)
Phosphorus: 4.2 mg/dL (ref 2.5–4.6)
Potassium: 3.9 mmol/L (ref 3.5–5.1)
Sodium: 135 mmol/L (ref 135–145)

## 2019-07-09 LAB — ABO/RH: ABO/RH(D): A POS

## 2019-07-09 LAB — IRON AND TIBC
Iron: 32 ug/dL — ABNORMAL LOW (ref 45–182)
Saturation Ratios: 15 % — ABNORMAL LOW (ref 17.9–39.5)
TIBC: 217 ug/dL — ABNORMAL LOW (ref 250–450)
UIBC: 185 ug/dL

## 2019-07-09 LAB — HEPARIN LEVEL (UNFRACTIONATED)
Heparin Unfractionated: 0.45 IU/mL (ref 0.30–0.70)
Heparin Unfractionated: 0.59 IU/mL (ref 0.30–0.70)

## 2019-07-09 LAB — CBC
HCT: 21.7 % — ABNORMAL LOW (ref 39.0–52.0)
Hemoglobin: 7.3 g/dL — ABNORMAL LOW (ref 13.0–17.0)
MCH: 31.9 pg (ref 26.0–34.0)
MCHC: 33.6 g/dL (ref 30.0–36.0)
MCV: 94.8 fL (ref 80.0–100.0)
Platelets: 263 10*3/uL (ref 150–400)
RBC: 2.29 MIL/uL — ABNORMAL LOW (ref 4.22–5.81)
RDW: 13.4 % (ref 11.5–15.5)
WBC: 14.9 10*3/uL — ABNORMAL HIGH (ref 4.0–10.5)
nRBC: 0 % (ref 0.0–0.2)

## 2019-07-09 LAB — PREPARE RBC (CROSSMATCH)

## 2019-07-09 LAB — URINE CULTURE: Culture: >=100000 — AB

## 2019-07-09 LAB — GLUCOSE, CAPILLARY
Glucose-Capillary: 145 mg/dL — ABNORMAL HIGH (ref 70–99)
Glucose-Capillary: 171 mg/dL — ABNORMAL HIGH (ref 70–99)
Glucose-Capillary: 208 mg/dL — ABNORMAL HIGH (ref 70–99)
Glucose-Capillary: 115 mg/dL — ABNORMAL HIGH (ref 70–99)
Glucose-Capillary: 214 mg/dL — ABNORMAL HIGH (ref 70–99)

## 2019-07-09 LAB — EXPECTORATED SPUTUM ASSESSMENT W GRAM STAIN, RFLX TO RESP C

## 2019-07-09 LAB — VITAMIN B12: Vitamin B-12: 637 pg/mL (ref 180–914)

## 2019-07-09 LAB — HEPATITIS B SURFACE ANTIGEN: Hepatitis B Surface Ag: NONREACTIVE

## 2019-07-09 LAB — FERRITIN: Ferritin: 544 ng/mL — ABNORMAL HIGH (ref 24–336)

## 2019-07-09 MED ORDER — SODIUM CHLORIDE 0.9 % IV SOLN
250.0000 mg | Freq: Every day | INTRAVENOUS | Status: AC
  Administered 2019-07-09 – 2019-07-11 (×3): 250 mg via INTRAVENOUS
  Filled 2019-07-09 (×4): qty 20

## 2019-07-09 MED ORDER — HEPARIN (PORCINE) 25000 UT/250ML-% IV SOLN
1500.0000 [IU]/h | INTRAVENOUS | Status: DC
  Administered 2019-07-09 – 2019-07-12 (×6): 1400 [IU]/h via INTRAVENOUS
  Administered 2019-07-13 – 2019-07-14 (×2): 1500 [IU]/h via INTRAVENOUS
  Filled 2019-07-09 (×9): qty 250

## 2019-07-09 MED ORDER — DARBEPOETIN ALFA 60 MCG/0.3ML IJ SOSY
PREFILLED_SYRINGE | INTRAMUSCULAR | Status: AC
  Administered 2019-07-09: 60 ug via INTRAVENOUS
  Filled 2019-07-09: qty 0.3

## 2019-07-09 MED ORDER — HEPARIN SODIUM (PORCINE) 1000 UNIT/ML DIALYSIS: 1000.0000 [IU] | INTRAMUSCULAR | Status: DC | PRN

## 2019-07-09 MED ORDER — SODIUM CHLORIDE 0.9 % IV SOLN: 100.0000 mL | INTRAVENOUS | Status: DC | PRN

## 2019-07-09 MED ORDER — FERROUS SULFATE 325 (65 FE) MG PO TABS
325.0000 mg | ORAL_TABLET | Freq: Two times a day (BID) | ORAL | Status: DC
  Administered 2019-07-09 – 2019-07-12 (×7): 325 mg via ORAL
  Filled 2019-07-09 (×7): qty 1

## 2019-07-09 MED ORDER — ALTEPLASE 2 MG IJ SOLR: 2.0000 mg | Freq: Once | INTRAMUSCULAR | Status: DC | PRN

## 2019-07-09 MED ORDER — ADULT MULTIVITAMIN W/MINERALS CH
1.0000 | ORAL_TABLET | Freq: Every day | ORAL | Status: DC
  Administered 2019-07-09 – 2019-07-12 (×4): 1 via ORAL
  Filled 2019-07-09 (×4): qty 1

## 2019-07-09 MED ORDER — DARBEPOETIN ALFA 60 MCG/0.3ML IJ SOSY
60.0000 ug | PREFILLED_SYRINGE | INTRAMUSCULAR | Status: DC
  Filled 2019-07-09: qty 0.3

## 2019-07-09 MED ORDER — LIDOCAINE-PRILOCAINE 2.5-2.5 % EX CREA: 1.0000 "application " | TOPICAL_CREAM | CUTANEOUS | Status: DC | PRN

## 2019-07-09 MED ORDER — TAMSULOSIN HCL 0.4 MG PO CAPS
0.4000 mg | ORAL_CAPSULE | Freq: Every day | ORAL | Status: DC
  Administered 2019-07-09 – 2019-07-26 (×17): 0.4 mg via ORAL
  Filled 2019-07-09 (×17): qty 1

## 2019-07-09 MED ORDER — LIDOCAINE HCL (PF) 1 % IJ SOLN: 5.0000 mL | INTRAMUSCULAR | Status: DC | PRN

## 2019-07-09 MED ORDER — SODIUM CHLORIDE 0.9% IV SOLUTION: Freq: Once | INTRAVENOUS | Status: DC

## 2019-07-09 MED ORDER — CHLORHEXIDINE GLUCONATE CLOTH 2 % EX PADS
6.0000 | MEDICATED_PAD | Freq: Every day | CUTANEOUS | Status: DC
  Administered 2019-07-09 – 2019-07-11 (×2): 6 via TOPICAL

## 2019-07-09 MED ORDER — FINASTERIDE 5 MG PO TABS
5.0000 mg | ORAL_TABLET | Freq: Every evening | ORAL | Status: DC
  Administered 2019-07-09 – 2019-07-26 (×17): 5 mg via ORAL
  Filled 2019-07-09 (×17): qty 1

## 2019-07-09 MED ORDER — PENTAFLUOROPROP-TETRAFLUOROETH EX AERO: 1.0000 "application " | INHALATION_SPRAY | CUTANEOUS | Status: DC | PRN

## 2019-07-09 NOTE — Plan of Care (Signed)
Poc progressing.  

## 2019-07-09 NOTE — Progress Notes (Signed)
Patient in A. Fib with controlled ventricular rate.  Is back on IV heparin, could start Warfarin in view of high Chads. Renal function remains precarious. Severe anemia ? Chronic disease.   CHA2DS2-VASc Score is 6.  Yearly risk of stroke: 9.8% (A, HTN, Vasc, DM,   CHF.   Adrian Prows, MD, [facility] 07/09/2019, 8:49 AM [facility] [place] Pager: [phone] Office: [phone]

## 2019-07-09 NOTE — Progress Notes (Signed)
ANTICOAGULATION CONSULT NOTE - Follow Up Consult  Pharmacy Consult for Heparin IV Indication: chest pain/ACS and atrial fibrillation  No Known Allergies  Patient Measurements: Height: 6\' 3"  (190.5 cm) Weight: 220 lb 10.9 oz (100.1 kg) IBW/kg (Calculated) : 84.5 Heparin Dosing Weight: 102.6 kg  Vital Signs: Temp: 98.7 F (37.1 C) (12/19 0750) Temp Source: Oral (12/19 0750) BP: 154/52 (12/19 0750) Pulse Rate: 68 (12/19 0750)  Labs: Recent Labs    07/07/19 1057 07/07/19 1057 07/07/19 1344 07/07/19 1935 07/07/19 2210 07/08/19 0336 07/08/19 1126 07/09/19 0318 07/09/19 0614  HGB 8.5*  --   --   --   --  8.1*  --  7.3*  --   HCT 26.2*  --   --   --   --  24.9*  --  21.7*  --   PLT 248  --   --   --   --  277  --  263  --   HEPARINUNFRC  --    < >  --  0.27*  --  0.66 0.56  --  0.45  CREATININE 6.61*  --   --   --   --  5.83*  --  5.94*  --   TROPONINIHS 32*  --  64* 101* 124*  --   --   --   --    < > = values in this interval not displayed.    Estimated Creatinine Clearance: 11.7 mL/min (A) (by C-G formula based on SCr of 5.94 mg/dL (H)).   Medications:  Infusions:  . cefTRIAXone (ROCEPHIN)  IV Stopped (07/08/19 1351)  . heparin 1,400 Units/hr (07/09/19 0237)    Assessment: 83 year old male presented with blood tinged sputum and bilateral LLE edema. EKG suggestive of ischemia. Heparin was stopped 12/18, but patient went into atrial fibrillation and heparin was restarted. No anticoagulation prior to admission.   Heparin level this morning therapeutic. Hgb trending down at 7.3, plts wnl. No overt bleeding or complications noted.  Goal of Therapy:  Heparin level 0.3-0.7 units/ml Monitor platelets by anticoagulation protocol: Yes   Plan:  Continue heparin IV at 1400 units/hr Check 8-hr confirmatory heparin level Daily heparin level and CBC Monitor for signs of bleeding  Richardine Service, PharmD PGY1 Pharmacy Resident Phone: [phone] 07/09/2019  8:17  AM  Please check [email or web address] for unit-specific pharmacy phone numbers.

## 2019-07-09 NOTE — Progress Notes (Signed)
[facility] NEPHROLOGY PROGRESS NOTE  Assessment/ Plan: Pt is a 83 y.o. yo male with history of CAD, CLL in remission, DM 2, CKD 5 follows with Dr. Posey Pronto, baseline creatinine level around 5.6, has a left UE AV fistula, admitted with a cough, fever and shortness of breath  #CKD stage V with fluid overload and uremia:  Marginal urine output with IV Lasix and remains fluid overload.  He now has uremia.  Plan to start dialysis today after discussion with the patient.  He has AV fistula for the access.  He will need arrangement for outpatient HD.  #Committee acquired pneumonia: On antibiotics per primary team.    #Anemia of CKD: Iron saturation 15%.  Ordered IV iron and start ESA.    #Metabolic acidosis: Discontinue sodium bicarbonate, starting HD.  #Hypertension: Continue current medication.  BP/volume expect to improve with dialysis.  #Acute on chronic CHF, non-STEMI: Currently on IV heparin.  Cardiology is following.  Subjective: Seen and examined at bedside.  Urine output is only 1150 cc with IV diuretics.  Remains fluid overload.  He is awake and started having some uremic symptoms.  Patient agreed to initiate dialysis. Objective Vital signs in last 24 hours: Vitals:   07/09/19 0517 07/09/19 0553 07/09/19 0750 07/09/19 0939  BP: (!) 150/56  (!) 154/52 (!) 158/56  Pulse: 63 65 68 70  Resp: 16 14 20  (!) 24  Temp: 98.5 F (36.9 C)  98.7 F (37.1 C) 98.7 F (37.1 C)  TempSrc: Oral  Oral Oral  SpO2: 99% 98% 100% 100%  Weight:  100.1 kg    Height:       Weight change: 1.08 kg  Intake/Output Summary (Last 24 hours) at 07/09/2019 1054 Last data filed at 07/09/2019 0900 Gross per 24 hour  Intake 760.95 ml  Output 1275 ml  Net -514.05 ml       Labs: Basic Metabolic Panel: Recent Labs  Lab 07/07/19 1057 07/08/19 0336 07/09/19 0318  NA 138 136 135  K 4.9 4.3 3.9  CL 111 107 104  CO2 15* 16* 17*  GLUCOSE 97 131* 151*  BUN 98* 95* 103*  CREATININE 6.61*  5.83* 5.94*  CALCIUM 9.2 9.2 8.9  PHOS  --  4.2 4.2   Liver Function Tests: Recent Labs  Lab 07/07/19 1057 07/08/19 0336 07/09/19 0318  AST 13*  --   --   ALT 14  --   --   ALKPHOS 61  --   --   BILITOT 0.6  --   --   PROT 6.4*  --   --   ALBUMIN 3.5 3.1* 2.7*   No results for input(s): LIPASE, AMYLASE in the last 168 hours. No results for input(s): AMMONIA in the last 168 hours. CBC: Recent Labs  Lab 07/07/19 1057 07/08/19 0336 07/09/19 0318  WBC 16.8* 19.0* 14.9*  NEUTROABS 11.3*  --   --   HGB 8.5* 8.1* 7.3*  HCT 26.2* 24.9* 21.7*  MCV 99.2 96.9 94.8  PLT 248 277 263   Cardiac Enzymes: No results for input(s): CKTOTAL, CKMB, CKMBINDEX, TROPONINI in the last 168 hours. CBG: Recent Labs  Lab 07/08/19 1111 07/08/19 1636 07/08/19 2130 07/09/19 0349 07/09/19 0615  GLUCAP 133* 140* 185* 145* 171*    Iron Studies:  Recent Labs    07/09/19 0614  IRON 32*  TIBC 217*  FERRITIN 544*   Studies/Results: DG Chest Port 1 View  Result Date: 07/07/2019 CLINICAL DATA:  Shortness of breath, cough EXAM:  PORTABLE CHEST 1 VIEW COMPARISON:  06/07/2017 FINDINGS: Heart size is mildly enlarged. Calcific aortic knob. Mildly prominent bibasilar interstitial markings. No pleural effusion or pneumothorax. IMPRESSION: Mild cardiomegaly with mild bibasilar interstitial prominence, could represent atelectasis, mild interstitial edema or atypical infection. Electronically Signed   By: Davina Poke M.D.   On: 07/07/2019 11:39   VAS [place] LOWER EXTREMITY VENOUS (DVT)  Result Date: 07/08/2019  Lower Venous Study Indications: Swelling.  Comparison Study: no prior Performing Technologist: June Leap RDMS, RVT  Examination Guidelines: A complete evaluation includes B-mode imaging, spectral Doppler, color Doppler, and power Doppler as needed of all accessible portions of each vessel. Bilateral testing is considered an integral part of a complete examination. Limited examinations for  reoccurring indications may be performed as noted.  +---------+---------------+---------+-----------+----------+--------------+ RIGHT    CompressibilityPhasicitySpontaneityPropertiesThrombus Aging +---------+---------------+---------+-----------+----------+--------------+ CFV      Full           Yes      Yes                                 +---------+---------------+---------+-----------+----------+--------------+ SFJ      Full                                                        +---------+---------------+---------+-----------+----------+--------------+ FV Prox  Full                                                        +---------+---------------+---------+-----------+----------+--------------+ FV Mid   Full                                                        +---------+---------------+---------+-----------+----------+--------------+ FV DistalFull                                                        +---------+---------------+---------+-----------+----------+--------------+ PFV      Full                                                        +---------+---------------+---------+-----------+----------+--------------+ POP      Full           Yes      Yes                                 +---------+---------------+---------+-----------+----------+--------------+ PTV      Full                                                        +---------+---------------+---------+-----------+----------+--------------+  PERO     Full                                                        +---------+---------------+---------+-----------+----------+--------------+   +---------+---------------+---------+-----------+----------+--------------+ LEFT     CompressibilityPhasicitySpontaneityPropertiesThrombus Aging +---------+---------------+---------+-----------+----------+--------------+ CFV      Full           Yes      Yes                                  +---------+---------------+---------+-----------+----------+--------------+ SFJ      Full                                                        +---------+---------------+---------+-----------+----------+--------------+ FV Prox  Full                                                        +---------+---------------+---------+-----------+----------+--------------+ FV Mid   Full                                                        +---------+---------------+---------+-----------+----------+--------------+ FV DistalFull                                                        +---------+---------------+---------+-----------+----------+--------------+ PFV      Full                                                        +---------+---------------+---------+-----------+----------+--------------+ POP      Full           Yes      Yes                                 +---------+---------------+---------+-----------+----------+--------------+ PTV      Full                                                        +---------+---------------+---------+-----------+----------+--------------+ PERO     Full                                                        +---------+---------------+---------+-----------+----------+--------------+  Summary: Right: There is no evidence of deep vein thrombosis in the lower extremity. No cystic structure found in the popliteal fossa. Ultrasound characteristics of enlarged lymph nodes are noted in the groin. Left: There is no evidence of deep vein thrombosis in the lower extremity. No cystic structure found in the popliteal fossa.  *See table(s) above for measurements and observations.    Preliminary     Medications: Infusions: . cefTRIAXone (ROCEPHIN)  IV Stopped (07/08/19 1351)  . ferric gluconate (FERRLECIT/NULECIT) IV    . heparin 1,400 Units/hr (07/09/19 0237)    Scheduled Medications: . amLODipine  5 mg Oral Daily  .  aspirin EC  81 mg Oral QHS  . atorvastatin  20 mg Oral Daily  . azithromycin  500 mg Oral Daily  . carvedilol  25 mg Oral BID  . Chlorhexidine Gluconate Cloth  6 each Topical Q0600  . cloNIDine  0.2 mg Oral BID  . darbepoetin (ARANESP) injection - DIALYSIS  60 mcg Intravenous Q Sat-HD  . ferrous sulfate  325 mg Oral BID  . finasteride  5 mg Oral QPM  . hydrALAZINE  100 mg Oral TID  . insulin aspart  0-9 Units Subcutaneous TID WC  . isosorbide dinitrate  30 mg Oral TID  . multivitamin with minerals  1 tablet Oral Daily  . ranolazine  1,000 mg Oral BID  . tamsulosin  0.4 mg Oral QPC supper  . ticagrelor  90 mg Oral BID    have reviewed scheduled and prn medications.  Physical Exam: General:NAD, comfortable Heart:RRR, s1s2 nl Lungs:clear b/l, no crackle Abdomen:soft, Non-tender, non-distended Extremities: Bilateral lower extremity pitting edema Dialysis Access: Left AV fistula has good thrill and bruit.  Elvera Almario Prasad Elana Jian 07/09/2019,10:54 AM  LOS: 2 days  Pager: [phone]

## 2019-07-09 NOTE — Telephone Encounter (Signed)
I left her a message on her phone yesterday

## 2019-07-09 NOTE — Progress Notes (Signed)
ANTICOAGULATION CONSULT NOTE  Pharmacy Consult for Heparin  Indication: Atrial fibrillation  No Known Allergies  Patient Measurements: Height: 6\' 3"  (190.5 cm) Weight: 218 lb 4.8 oz (99 kg) IBW/kg (Calculated) : 84.5 Heparin Dosing Weight: 102.6 kg  Vital Signs: Temp: 98.8 F (37.1 C) (12/18 2318) Temp Source: Oral (12/18 2318) BP: 135/61 (12/18 2318) Pulse Rate: 83 (12/18 2318)  Labs: Recent Labs    07/07/19 1057 07/07/19 1344 07/07/19 1935 07/07/19 2210 07/08/19 0336 07/08/19 1126  HGB 8.5*  --   --   --  8.1*  --   HCT 26.2*  --   --   --  24.9*  --   PLT 248  --   --   --  277  --   HEPARINUNFRC  --   --  0.27*  --  0.66 0.56  CREATININE 6.61*  --   --   --  5.83*  --   TROPONINIHS 32* 64* 101* 124*  --   --     Estimated Creatinine Clearance: 11.9 mL/min (A) (by C-G formula based on SCr of 5.83 mg/dL (H)).  Assessment: 83 y.o. male admitted with chest pain, now with Afib, for heparin  Goal of Therapy:  Heparin level 0.3-0.7 units/ml Monitor platelets by anticoagulation protocol: Yes   Plan:  Restart heparin 1400 units/hr Check heparin level in 8 hours.   Phillis Knack, PharmD, BCPS   07/09/2019 12:22 AM

## 2019-07-09 NOTE — Progress Notes (Signed)
PROGRESS NOTE  Ray Merritt RCV:[id number] DOB: 07/30/1935 DOA: 07/07/2019 PCP: [name], MD  HPI/Recap of past 73 hours: 83 year old gentleman with history of hypertension, chronic kidney disease stage IV nearing hemodialysis, coronary artery disease, recent abnormal stress test, type 2 diabetes and CLL presented to the emergency room with shortness of breath, cough with blood-tinged sputum since morning.  He had some atypical chest pain.  Recently with weight gain and extremity edema.  He does have left AV arm AV fistula that has not been used yet. In the emergency room, febrile with temperature 100.6, blood pressures elevated, 2 L nasal cannula placed for comfort.  WBC 16.8.  Creatinine 6.61.  EKG revealed ST depression in inferior lateral leads.  Mildly elevated troponins.  Chest x-ray with fluid in the fissures, suspected atelectasis versus pneumonia. COVID-19 point-of-care and PCR test negative.  Admitted with IV antibiotics and heparin infusion with cardiology and nephrology consult.  07/09/19: Seen and examined.  No complaints.  Hemoglobin dropped down to 7.3.  Creatinine up trending.  Iron studies suggestive of iron deficiency.  Restarted on home ferrous sulfate twice daily.  He denies dyspnea or chest pain at rest.  Assessment/Plan: Principal Problem:   Sepsis ([place]) Active Problems:   CLL (chronic lymphocytic leukemia) (HCC)   Type 2 diabetes, HbA1c goal < 7% (HCC)   Acute renal failure superimposed on chronic kidney disease (HCC)   Acute on chronic combined systolic and diastolic CHF (congestive heart failure) (HCC)   Hypertensive urgency   Urinary tract infection   Abnormal EKG   Elevated troponin  Sepsis secondary to community-acquired pneumonia versus E. coli UTI Urine culture grew greater than 100,000 colonies of E. coli, pansensitive. On Rocephin. He is also on IV azithromycin for community-acquired pneumonia. O2 saturation 99% on room air. Leukocytosis  is trending down but still elevated.  Tachypnea is also improving.  NSTEMI Followed by cardiology On aspirin and Lipitor Continue cardiac medications per cards recommendations No anginal symptoms at the time of this visit.  AKI on CKD 4 Presented with creatinine greater than 6 Nephrology following with daily renal panel Urine output 1.1 L recorded in the last 24 hours Continue to monitor urine output Continue to avoid nephrotoxins Continue daily BMPs  High anion gap metabolic acidosis likely in the setting of worsening renal function Management per nephrology  Acute blood loss anemia in the setting of iron deficiency Iron studies suggestive of iron deficiency Restarted home ferrous sulfate twice daily May benefit from Erythropoietin, defer to nephrology Hemoglobin down to 7.3 from 8.1 Transfuse 1 U PRBC to maintain hemoglobin greater than 8 Monitor H&H No sign of obvious bleeding.  BPH Stable Resume home medications Monitor urine output  Acute on chronic combined systolic and diastolic CHF Continue strict I's and O's and daily weight Continue cardiac medications  Essential hypertension BP is at goal Continue home medications  CLL Stable  DVT prophylaxis: Heparin infusion Code Status: Full code Family Communication: None  Disposition Plan: Pending clinical improvement, nephrology and cardiology signing off.   Consultants:   Cardiology, Dr. Einar Gip  Nephrology  Procedures:   None  Antimicrobials:   Rocephin, azithromycin, 07/07/2019---        Objective: Vitals:   07/09/19 0553 07/09/19 0750 07/09/19 0939 07/09/19 1125  BP:  (!) 154/52 (!) 158/56 (!) 141/53  Pulse: 65 68 70   Resp: 14 20 (!) 24   Temp:  98.7 F (37.1 C) 98.7 F (37.1 C) 98.8 F (37.1 C)  TempSrc:  Oral Oral Oral  SpO2: 98% 100% 100%   Weight: 100.1 kg     Height:        Intake/Output Summary (Last 24 hours) at 07/09/2019 1223 Last data filed at 07/09/2019  0900 Gross per 24 hour  Intake 760.95 ml  Output 1275 ml  Net -514.05 ml   Filed Weights   07/08/19 0700 07/09/19 0553  Weight: 99 kg 100.1 kg    Exam:  . General: 83 y.o. year-old male well developed well nourished in no acute distress.  Alert and oriented x3. . Cardiovascular: Regular rate and rhythm with no rubs or gallops.  No thyromegaly or JVD noted.   Marland Kitchen Respiratory: Clear to auscultation with no wheezes or rales. Good inspiratory effort. . Abdomen: Soft nontender nondistended with normal bowel sounds x4 quadrants. . Musculoskeletal: Trace lower extremity edema. 2/4 pulses in all 4 extremities. Marland Kitchen Psychiatry: Mood is appropriate for condition and setting   Data Reviewed: CBC: Recent Labs  Lab 07/07/19 1057 07/08/19 0336 07/09/19 0318  WBC 16.8* 19.0* 14.9*  NEUTROABS 11.3*  --   --   HGB 8.5* 8.1* 7.3*  HCT 26.2* 24.9* 21.7*  MCV 99.2 96.9 94.8  PLT 248 277 604   Basic Metabolic Panel: Recent Labs  Lab 07/07/19 1057 07/08/19 0336 07/09/19 0318  NA 138 136 135  K 4.9 4.3 3.9  CL 111 107 104  CO2 15* 16* 17*  GLUCOSE 97 131* 151*  BUN 98* 95* 103*  CREATININE 6.61* 5.83* 5.94*  CALCIUM 9.2 9.2 8.9  PHOS  --  4.2 4.2   GFR: Estimated Creatinine Clearance: 11.7 mL/min (A) (by C-G formula based on SCr of 5.94 mg/dL (H)). Liver Function Tests: Recent Labs  Lab 07/07/19 1057 07/08/19 0336 07/09/19 0318  AST 13*  --   --   ALT 14  --   --   ALKPHOS 61  --   --   BILITOT 0.6  --   --   PROT 6.4*  --   --   ALBUMIN 3.5 3.1* 2.7*   No results for input(s): LIPASE, AMYLASE in the last 168 hours. No results for input(s): AMMONIA in the last 168 hours. Coagulation Profile: No results for input(s): INR, PROTIME in the last 168 hours. Cardiac Enzymes: No results for input(s): CKTOTAL, CKMB, CKMBINDEX, TROPONINI in the last 168 hours. BNP (last 3 results) No results for input(s): PROBNP in the last 8760 hours. HbA1C: Recent Labs    07/07/19 1650   HGBA1C 5.4   CBG: Recent Labs  Lab 07/08/19 1636 07/08/19 2130 07/09/19 0349 07/09/19 0615 07/09/19 1128  GLUCAP 140* 185* 145* 171* 208*   Lipid Profile: Recent Labs    07/07/19 1057  TRIG 71   Thyroid Function Tests: No results for input(s): TSH, T4TOTAL, FREET4, T3FREE, THYROIDAB in the last 72 hours. Anemia Panel: Recent Labs    07/07/19 1057 07/09/19 0614  VITAMINB12  --  637  FERRITIN 420* 544*  TIBC  --  217*  IRON  --  32*   Urine analysis:    Component Value Date/Time   COLORURINE YELLOW 07/07/2019 1409   APPEARANCEUR HAZY (A) 07/07/2019 1409   LABSPEC 1.014 07/07/2019 1409   PHURINE 5.0 07/07/2019 1409   GLUCOSEU NEGATIVE 07/07/2019 1409   HGBUR NEGATIVE 07/07/2019 1409   BILIRUBINUR NEGATIVE 07/07/2019 1409   KETONESUR NEGATIVE 07/07/2019 1409   PROTEINUR 100 (A) 07/07/2019 1409   UROBILINOGEN 0.2 10/07/2011 1203   NITRITE NEGATIVE 07/07/2019 1409  LEUKOCYTESUR SMALL (A) 07/07/2019 1409   Sepsis Labs: @LABRCNTIP (procalcitonin:4,lacticidven:4)  ) Recent Results (from the past 240 hour(s))  Blood Culture (routine x 2)     Status: None (Preliminary result)   Collection Time: 07/07/19 10:57 AM   Specimen: BLOOD RIGHT ARM  Result Value Ref Range Status   Specimen Description BLOOD RIGHT ARM  Final   Special Requests   Final    BOTTLES DRAWN AEROBIC AND ANAEROBIC Blood Culture adequate volume   Culture   Final    NO GROWTH 1 DAY Performed at [facility], [place] [address], [place], [place] [address]    Report Status PENDING  Incomplete  Blood Culture (routine x 2)     Status: None (Preliminary result)   Collection Time: 07/07/19 11:49 AM   Specimen: BLOOD RIGHT FOREARM  Result Value Ref Range Status   Specimen Description BLOOD RIGHT FOREARM  Final   Special Requests   Final    BOTTLES DRAWN AEROBIC ONLY Blood Culture adequate volume   Culture   Final    NO GROWTH 1 DAY Performed at [facility], [place] [address],  [place],  [address]    Report Status PENDING  Incomplete  Respiratory Panel by RT PCR (Flu A&B, Covid) - Nasopharyngeal Swab     Status: None   Collection Time: 07/07/19  1:47 PM   Specimen: Nasopharyngeal Swab  Result Value Ref Range Status   SARS Coronavirus 2 by RT PCR NEGATIVE NEGATIVE Final    Comment: (NOTE) SARS-CoV-2 target nucleic acids are NOT DETECTED. The SARS-CoV-2 RNA is generally detectable in upper respiratoy specimens during the acute phase of infection. The lowest concentration of SARS-CoV-2 viral copies this assay can detect is 131 copies/mL. A negative result does not preclude SARS-Cov-2 infection and should not be used as the sole basis for treatment or other patient management decisions. A negative result may occur with  improper specimen collection/handling, submission of specimen other than nasopharyngeal swab, presence of viral mutation(s) within the areas targeted by this assay, and inadequate number of viral copies (<131 copies/mL). A negative result must be combined with clinical observations, patient history, and epidemiological information. The expected result is Negative. Fact Sheet for Patients:  PinkCheek.be Fact Sheet for Healthcare Providers:  GravelBags.it This test is not yet ap proved or cleared by the [place] FDA and  has been authorized for detection and/or diagnosis of SARS-CoV-2 by FDA under an Emergency Use Authorization (EUA). This EUA will remain  in effect (meaning this test can be used) for the duration of the COVID-19 declaration under Section 564(b)(1) of the Act, 21 U.S.C. section 360bbb-3(b)(1), unless the authorization is terminated or revoked sooner.    Influenza A by PCR NEGATIVE NEGATIVE Final   Influenza B by PCR NEGATIVE NEGATIVE Final    Comment: (NOTE) The Xpert Xpress SARS-CoV-2/FLU/RSV assay is intended as an aid in  the diagnosis of influenza from  Nasopharyngeal swab specimens and  should not be used as a sole basis for treatment. Nasal washings and  aspirates are unacceptable for Xpert Xpress SARS-CoV-2/FLU/RSV  testing. Fact Sheet for Patients: PinkCheek.be Fact Sheet for Healthcare Providers: GravelBags.it This test is not yet approved or cleared by the [place] FDA and  has been authorized for detection and/or diagnosis of SARS-CoV-2 by  FDA under an Emergency Use Authorization (EUA). This EUA will remain  in effect (meaning this test can be used) for the duration of the  Covid-19 declaration under Section 564(b)(1)  of the Act, 21  U.S.C. section 360bbb-3(b)(1), unless the authorization is  terminated or revoked. Performed at [facility], [place] [address], [place], [place] [address]   Respiratory Panel by PCR     Status: None   Collection Time: 07/07/19  1:47 PM   Specimen: Nasopharyngeal Swab; Respiratory  Result Value Ref Range Status   Adenovirus NOT DETECTED NOT DETECTED Final   Coronavirus 229E NOT DETECTED NOT DETECTED Final    Comment: (NOTE) The Coronavirus on the Respiratory Panel, DOES NOT test for the novel  Coronavirus (2019 nCoV)    Coronavirus HKU1 NOT DETECTED NOT DETECTED Final   Coronavirus NL63 NOT DETECTED NOT DETECTED Final   Coronavirus OC43 NOT DETECTED NOT DETECTED Final   Metapneumovirus NOT DETECTED NOT DETECTED Final   Rhinovirus / Enterovirus NOT DETECTED NOT DETECTED Final   Influenza A NOT DETECTED NOT DETECTED Final   Influenza B NOT DETECTED NOT DETECTED Final   Parainfluenza Virus 1 NOT DETECTED NOT DETECTED Final   Parainfluenza Virus 2 NOT DETECTED NOT DETECTED Final   Parainfluenza Virus 3 NOT DETECTED NOT DETECTED Final   Parainfluenza Virus 4 NOT DETECTED NOT DETECTED Final   Respiratory Syncytial Virus NOT DETECTED NOT DETECTED Final   Bordetella pertussis NOT DETECTED NOT DETECTED Final   Chlamydophila  pneumoniae NOT DETECTED NOT DETECTED Final   Mycoplasma pneumoniae NOT DETECTED NOT DETECTED Final    Comment: Performed at [facility], [place] [address], [place], [place] [address]  Urine culture     Status: Abnormal   Collection Time: 07/07/19  2:13 PM   Specimen: Urine, Random  Result Value Ref Range Status   Specimen Description URINE, RANDOM  Final   Special Requests   Final    NONE Performed at [facility], [place] [address] [place], [place] [address]    Culture >=100,000 COLONIES/mL ESCHERICHIA COLI (A)  Final   Report Status 07/09/2019 FINAL  Final   Organism ID, Bacteria ESCHERICHIA COLI (A)  Final      Susceptibility   Escherichia coli - MIC*    AMPICILLIN 8 SENSITIVE Sensitive     CEFAZOLIN <=4 SENSITIVE Sensitive     CEFTRIAXONE <=1 SENSITIVE Sensitive     CIPROFLOXACIN <=0.25 SENSITIVE Sensitive     GENTAMICIN <=1 SENSITIVE Sensitive     IMIPENEM <=0.25 SENSITIVE Sensitive     NITROFURANTOIN <=16 SENSITIVE Sensitive     TRIMETH/SULFA <=20 SENSITIVE Sensitive     AMPICILLIN/SULBACTAM 4 SENSITIVE Sensitive     PIP/TAZO <=4 SENSITIVE Sensitive     * >=100,000 COLONIES/mL ESCHERICHIA COLI  Expectorated sputum assessment w rflx to resp cult     Status: None   Collection Time: 07/09/19  1:26 AM   Specimen: SPU  Result Value Ref Range Status   Specimen Description SPUTUM  Final   Special Requests NONE  Final   Sputum evaluation   Final    THIS SPECIMEN IS ACCEPTABLE FOR SPUTUM CULTURE Performed at [facility], [address], [place], [place] [address]    Report Status 07/09/2019 FINAL  Final  Culture, respiratory     Status: None (Preliminary result)   Collection Time: 07/09/19  1:26 AM   Specimen: SPU  Result Value Ref Range Status   Specimen Description SPUTUM  Final   Special Requests NONE Reflexed from [id number]  Final   Gram Stain   Final    MODERATE WBC PRESENT, PREDOMINANTLY PMN FEW GRAM POSITIVE COCCI IN CHAINS  FEW GRAM NEGATIVE  RODS Performed at [facility], [place] [address] [place], [place] [address]    Culture PENDING  Incomplete   Report Status PENDING  Incomplete      Studies: VAS [place] LOWER EXTREMITY VENOUS (DVT)  Result Date: 07/08/2019  Lower Venous Study Indications: Swelling.  Comparison Study: no prior Performing Technologist: June Leap RDMS, RVT  Examination Guidelines: A complete evaluation includes B-mode imaging, spectral Doppler, color Doppler, and power Doppler as needed of all accessible portions of each vessel. Bilateral testing is considered an integral part of a complete examination. Limited examinations for reoccurring indications may be performed as noted.  +---------+---------------+---------+-----------+----------+--------------+ RIGHT    CompressibilityPhasicitySpontaneityPropertiesThrombus Aging +---------+---------------+---------+-----------+----------+--------------+ CFV      Full           Yes      Yes                                 +---------+---------------+---------+-----------+----------+--------------+ SFJ      Full                                                        +---------+---------------+---------+-----------+----------+--------------+ FV Prox  Full                                                        +---------+---------------+---------+-----------+----------+--------------+ FV Mid   Full                                                        +---------+---------------+---------+-----------+----------+--------------+ FV DistalFull                                                        +---------+---------------+---------+-----------+----------+--------------+ PFV      Full                                                        +---------+---------------+---------+-----------+----------+--------------+ POP      Full           Yes      Yes                                  +---------+---------------+---------+-----------+----------+--------------+ PTV      Full                                                        +---------+---------------+---------+-----------+----------+--------------+  PERO     Full                                                        +---------+---------------+---------+-----------+----------+--------------+   +---------+---------------+---------+-----------+----------+--------------+ LEFT     CompressibilityPhasicitySpontaneityPropertiesThrombus Aging +---------+---------------+---------+-----------+----------+--------------+ CFV      Full           Yes      Yes                                 +---------+---------------+---------+-----------+----------+--------------+ SFJ      Full                                                        +---------+---------------+---------+-----------+----------+--------------+ FV Prox  Full                                                        +---------+---------------+---------+-----------+----------+--------------+ FV Mid   Full                                                        +---------+---------------+---------+-----------+----------+--------------+ FV DistalFull                                                        +---------+---------------+---------+-----------+----------+--------------+ PFV      Full                                                        +---------+---------------+---------+-----------+----------+--------------+ POP      Full           Yes      Yes                                 +---------+---------------+---------+-----------+----------+--------------+ PTV      Full                                                        +---------+---------------+---------+-----------+----------+--------------+ PERO     Full                                                         +---------+---------------+---------+-----------+----------+--------------+  Summary: Right: There is no evidence of deep vein thrombosis in the lower extremity. No cystic structure found in the popliteal fossa. Ultrasound characteristics of enlarged lymph nodes are noted in the groin. Left: There is no evidence of deep vein thrombosis in the lower extremity. No cystic structure found in the popliteal fossa.  *See table(s) above for measurements and observations.    Preliminary     Scheduled Meds: . amLODipine  5 mg Oral Daily  . aspirin EC  81 mg Oral QHS  . atorvastatin  20 mg Oral Daily  . azithromycin  500 mg Oral Daily  . carvedilol  25 mg Oral BID  . Chlorhexidine Gluconate Cloth  6 each Topical Q0600  . cloNIDine  0.2 mg Oral BID  . darbepoetin (ARANESP) injection - DIALYSIS  60 mcg Intravenous Q Sat-HD  . ferrous sulfate  325 mg Oral BID  . finasteride  5 mg Oral QPM  . hydrALAZINE  100 mg Oral TID  . insulin aspart  0-9 Units Subcutaneous TID WC  . isosorbide dinitrate  30 mg Oral TID  . multivitamin with minerals  1 tablet Oral Daily  . ranolazine  1,000 mg Oral BID  . tamsulosin  0.4 mg Oral QPC supper  . ticagrelor  90 mg Oral BID    Continuous Infusions: . cefTRIAXone (ROCEPHIN)  IV Stopped (07/08/19 1351)  . ferric gluconate (FERRLECIT/NULECIT) IV    . heparin 1,400 Units/hr (07/09/19 0237)     LOS: 2 days     Kayleen Memos, MD [facility] Pager [phone]  If 7PM-7AM, please contact night-coverage [email or web address] Password [facility] 07/09/2019, 12:23 PM

## 2019-07-09 NOTE — Progress Notes (Signed)
ANTICOAGULATION CONSULT NOTE - Follow Up Consult  Pharmacy Consult for Heparin IV Indication: chest pain/ACS and atrial fibrillation  No Known Allergies  Patient Measurements: Height: 6\' 3"  (190.5 cm) Weight: 219 lb 9.3 oz (99.6 kg) IBW/kg (Calculated) : 84.5 Heparin Dosing Weight: 102.6 kg  Vital Signs: Temp: 98.6 F (37 C) (12/19 1240) Temp Source: Oral (12/19 1240) BP: 152/65 (12/19 1430) Pulse Rate: 66 (12/19 1430)  Labs: Recent Labs    07/07/19 1057 07/07/19 1057 07/07/19 1344 07/07/19 1935 07/07/19 2210 07/08/19 0336 07/08/19 1126 07/09/19 0318 07/09/19 0614 07/09/19 1501  HGB 8.5*  --   --   --   --  8.1*  --  7.3*  --   --   HCT 26.2*  --   --   --   --  24.9*  --  21.7*  --   --   PLT 248  --   --   --   --  277  --  263  --   --   HEPARINUNFRC  --    < >  --  0.27*  --  0.66 0.56  --  0.45 0.59  CREATININE 6.61*  --   --   --   --  5.83*  --  5.94*  --   --   TROPONINIHS 32*  --  64* 101* 124*  --   --   --   --   --    < > = values in this interval not displayed.    Estimated Creatinine Clearance: 11.7 mL/min (A) (by C-G formula based on SCr of 5.94 mg/dL (H)).   Medications:  Infusions:  . sodium chloride    . sodium chloride    . cefTRIAXone (ROCEPHIN)  IV Stopped (07/08/19 1351)  . ferric gluconate (FERRLECIT/NULECIT) IV    . heparin 1,400 Units/hr (07/09/19 0237)    Assessment: 83 year old male presented with blood tinged sputum and bilateral LLE edema. EKG suggestive of ischemia. Heparin was stopped 12/18, but patient went into atrial fibrillation and heparin was restarted. No anticoagulation prior to admission.   Confirmatory heparin level this afternoon therapeutic at 0.59. Hgb trending down at 7.3, plts wnl. No overt bleeding or complications noted.  Goal of Therapy:  Heparin level 0.3-0.7 units/ml Monitor platelets by anticoagulation protocol: Yes   Plan:  Continue heparin IV at 1400 units/hr Daily heparin level and CBC Monitor for  signs of bleeding  Agnes Lawrence, PharmD PGY1 Pharmacy Resident

## 2019-07-10 ENCOUNTER — Inpatient Hospital Stay (HOSPITAL_COMMUNITY): Payer: Medicare Other

## 2019-07-10 LAB — RENAL FUNCTION PANEL
Albumin: 2.6 g/dL — ABNORMAL LOW (ref 3.5–5.0)
Anion gap: 12 (ref 5–15)
BUN: 70 mg/dL — ABNORMAL HIGH (ref 8–23)
CO2: 21 mmol/L — ABNORMAL LOW (ref 22–32)
Calcium: 8.6 mg/dL — ABNORMAL LOW (ref 8.9–10.3)
Chloride: 104 mmol/L (ref 98–111)
Creatinine, Ser: 4.65 mg/dL — ABNORMAL HIGH (ref 0.61–1.24)
GFR calc Af Amer: 13 mL/min — ABNORMAL LOW (ref >60)
GFR calc non Af Amer: 11 mL/min — ABNORMAL LOW (ref >60)
Glucose, Bld: 184 mg/dL — ABNORMAL HIGH (ref 70–99)
Phosphorus: 3.4 mg/dL (ref 2.5–4.6)
Potassium: 3.8 mmol/L (ref 3.5–5.1)
Sodium: 137 mmol/L (ref 135–145)

## 2019-07-10 LAB — CBC
HCT: 23.5 % — ABNORMAL LOW (ref 39.0–52.0)
Hemoglobin: 7.8 g/dL — ABNORMAL LOW (ref 13.0–17.0)
MCH: 31.7 pg (ref 26.0–34.0)
MCHC: 33.2 g/dL (ref 30.0–36.0)
MCV: 95.5 fL (ref 80.0–100.0)
Platelets: 239 10*3/uL (ref 150–400)
RBC: 2.46 MIL/uL — ABNORMAL LOW (ref 4.22–5.81)
RDW: 13.1 % (ref 11.5–15.5)
WBC: 16.5 10*3/uL — ABNORMAL HIGH (ref 4.0–10.5)
nRBC: 0 % (ref 0.0–0.2)

## 2019-07-10 LAB — GLUCOSE, CAPILLARY
Glucose-Capillary: 207 mg/dL — ABNORMAL HIGH (ref 70–99)
Glucose-Capillary: 185 mg/dL — ABNORMAL HIGH (ref 70–99)
Glucose-Capillary: 205 mg/dL — ABNORMAL HIGH (ref 70–99)
Glucose-Capillary: 140 mg/dL — ABNORMAL HIGH (ref 70–99)

## 2019-07-10 LAB — PROTIME-INR
INR: 1.1 (ref 0.8–1.2)
Prothrombin Time: 14 seconds (ref 11.4–15.2)

## 2019-07-10 LAB — BRAIN NATRIURETIC PEPTIDE: B Natriuretic Peptide: 195.8 pg/mL — ABNORMAL HIGH (ref 0.0–100.0)

## 2019-07-10 LAB — HEPARIN LEVEL (UNFRACTIONATED): Heparin Unfractionated: 0.56 IU/mL (ref 0.30–0.70)

## 2019-07-10 MED ORDER — FUROSEMIDE 10 MG/ML IJ SOLN
40.0000 mg | Freq: Once | INTRAMUSCULAR | Status: AC
  Administered 2019-07-10: 40 mg via INTRAVENOUS
  Filled 2019-07-10: qty 4

## 2019-07-10 MED ORDER — IPRATROPIUM-ALBUTEROL 0.5-2.5 (3) MG/3ML IN SOLN
3.0000 mL | Freq: Four times a day (QID) | RESPIRATORY_TRACT | Status: DC | PRN
  Administered 2019-07-11: 3 mL via RESPIRATORY_TRACT
  Filled 2019-07-10: qty 3

## 2019-07-10 MED ORDER — WARFARIN - PHARMACIST DOSING INPATIENT: Freq: Every day | Status: DC

## 2019-07-10 MED ORDER — CHLORHEXIDINE GLUCONATE CLOTH 2 % EX PADS
6.0000 | MEDICATED_PAD | Freq: Every day | CUTANEOUS | Status: DC
  Administered 2019-07-10 – 2019-07-11 (×2): 6 via TOPICAL

## 2019-07-10 MED ORDER — IPRATROPIUM-ALBUTEROL 0.5-2.5 (3) MG/3ML IN SOLN
3.0000 mL | Freq: Four times a day (QID) | RESPIRATORY_TRACT | Status: DC
  Administered 2019-07-10: 3 mL via RESPIRATORY_TRACT
  Filled 2019-07-10: qty 3

## 2019-07-10 MED ORDER — WARFARIN SODIUM 7.5 MG PO TABS
7.5000 mg | ORAL_TABLET | Freq: Once | ORAL | Status: AC
  Administered 2019-07-10: 7.5 mg via ORAL
  Filled 2019-07-10: qty 1

## 2019-07-10 MED ORDER — FUROSEMIDE 10 MG/ML IJ SOLN
60.0000 mg | Freq: Once | INTRAMUSCULAR | Status: AC
  Administered 2019-07-10: 60 mg via INTRAVENOUS
  Filled 2019-07-10: qty 6

## 2019-07-10 NOTE — Progress Notes (Signed)
ANTICOAGULATION CONSULT NOTE - Follow Up Consult  Pharmacy Consult for Heparin IV and Warfarin Indication: chest pain/ACS and atrial fibrillation  No Known Allergies  Patient Measurements: Height: 6\' 3"  (190.5 cm) Weight: 217 lb 2.5 oz (98.5 kg) IBW/kg (Calculated) : 84.5 Heparin Dosing Weight: 102.6 kg  Vital Signs: Temp: 99 F (37.2 C) (12/20 0740) Temp Source: Oral (12/20 0740) BP: 166/56 (12/20 0850) Pulse Rate: 75 (12/20 0743)  Labs: Recent Labs    07/07/19 1057 07/07/19 1344 07/07/19 1935 07/07/19 2210 07/08/19 0336 07/09/19 0318 07/09/19 0614 07/09/19 1501 07/10/19 0301 07/10/19 0836  HGB  --   --   --   --  8.1* 7.3*  --   --  7.8*  --   HCT  --   --   --   --  24.9* 21.7*  --   --  23.5*  --   PLT  --   --   --   --  277 263  --   --  239  --   LABPROT  --   --   --   --   --   --   --   --   --  14.0  INR  --   --   --   --   --   --   --   --   --  1.1  HEPARINUNFRC   < >  --  0.27*  --  0.66  --  0.45 0.59 0.56  --   CREATININE  --   --   --   --  5.83* 5.94*  --   --  4.65*  --   TROPONINIHS  --  64* 101* 124*  --   --   --   --   --   --    < > = values in this interval not displayed.    Estimated Creatinine Clearance: 14.9 mL/min (A) (by C-G formula based on SCr of 4.65 mg/dL (H)).   Medications:  Infusions:  . cefTRIAXone (ROCEPHIN)  IV 2 g (07/09/19 1724)  . ferric gluconate (FERRLECIT/NULECIT) IV 250 mg (07/10/19 0846)  . heparin 1,400 Units/hr (07/10/19 9702)    Assessment: 83 year old male presented with blood tinged sputum and bilateral LLE edema. EKG suggestive of ischemia. Heparin was stopped 12/18, but patient went into atrial fibrillation and heparin was restarted. No anticoagulation prior to admission. Pharmacy now asked to start warfarin.  Heparin level therapeutic on drip rate 1400 units/hr. Baseline INR 1.1 today. Hgb (7.8) low but improved. Plts wnl. No overt bleeding or infusion issues noted.  Goal of Therapy:  INR  2-3 Heparin level 0.3-0.7 units/ml Monitor platelets by anticoagulation protocol: Yes   Plan:  Start warfarin 7.5 mg PO x1 tonight Continue heparin IV at 1400 units/hr Daily INR, heparin level, and CBC Monitor for signs of bleeding  Richardine Service, PharmD PGY1 Pharmacy Resident Phone: [phone] 07/10/2019  10:08 AM  Please check [email or web address] for unit-specific pharmacy phone numbers.

## 2019-07-10 NOTE — Discharge Instructions (Signed)

## 2019-07-10 NOTE — Progress Notes (Signed)
Audible wheezing noted this afternoon by bedside RN.  Came to bedside.  Daughter Melonie was present.  Ordered CXR, personally reviewed suggestive of pulmonary edema.  Discussed with nephrology Dr. Carolin Sicks, recommends IV lasix 120 mg once.  BP stable.  IV lasix 40 mg administered earlier. 60 mg IV lasix ordered next.  Will repeat BP and if BP allows will administer the last 20 mg IV lasix.    Also received Duonebs with improvement of symptomatology.  Will continue to closely monitor.

## 2019-07-10 NOTE — Progress Notes (Signed)
Updated patient's daughter via phone. Will see also in person.  All questions thus far answered to her satisfaction.

## 2019-07-10 NOTE — Plan of Care (Signed)
Poc progressing.  

## 2019-07-10 NOTE — Progress Notes (Signed)
[facility] NEPHROLOGY PROGRESS NOTE  Assessment/ Plan: Pt is a 83 y.o. 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 Initiated HD on 12/19, tolerated well with UF of 1000 cc.  Next HD tomorrow. He Ray AV Merritt for the access.  He will need arrangement for outpatient HD.  #Committee acquired pneumonia: On antibiotics per primary team.    #Anemia of Ray: Iron saturation 15%.  Ordered IV iron 250 mg x 3 doses and start ESA.   #Metabolic acidosis: Discontinued sodium bicarbonate, starting HD.  #Hypertension: Continue current medication.  BP/volume expect to improve with dialysis.  #Acute on chronic CHF, non-STEMI: Currently on IV heparin.  Cardiology is following.  Subjective: Seen and examined at bedside.  He did well with first HD yesterday, tolerated well.  No issue with cannulation.  Denies chest pain, shortness of Merritt, nausea vomiting.  Somewhat weak today. Objective Vital signs in last 24 hours: Vitals:   07/10/19 0536 07/10/19 0740 07/10/19 0743 07/10/19 0850  BP:  (!) 176/55 (!) 174/53 (!) 166/56  Pulse:  78 75   Resp: (!) 21 20    Temp:  99 F (37.Merritt C)    TempSrc:  Oral    SpO2:  99% 99%   Weight: 98.5 kg     Height:       Weight change: -0.5 kg  Intake/Output Summary (Last 24 hours) at 07/10/2019 0931 Last data filed at 07/10/2019 0800 Gross per 24 hour  Intake 1079.39 ml  Output 1300 ml  Net -220.61 ml       Labs: Basic Metabolic Panel: Recent Labs  Lab 07/08/19 0336 07/09/19 0318 07/10/19 0301  NA 136 135 137  K 4.3 3.9 3.8  CL 107 104 104  CO2 16* 17* 21*  GLUCOSE 131* 151* 184*  BUN 95* 103* 70*  CREATININE 5.83* 5.94* 4.65*  CALCIUM 9.Merritt 8.9 8.6*  PHOS 4.Merritt 4.Merritt 3.4   Liver Function  Tests: Recent Labs  Lab 07/07/19 1057 07/08/19 0336 07/09/19 0318 07/10/19 0301  AST 13*  --   --   --   ALT 14  --   --   --   ALKPHOS 61  --   --   --   BILITOT 0.6  --   --   --   PROT 6.4*  --   --   --   ALBUMIN 3.5 3.1* Merritt.7* Merritt.6*   No results for input(s): LIPASE, AMYLASE in the last 168 hours. No results for input(s): AMMONIA in the last 168 hours. CBC: Recent Labs  Lab 07/07/19 1057 07/08/19 0336 07/09/19 0318 07/10/19 0301  WBC 16.8* 19.0* 14.9* 16.5*  NEUTROABS 11.3*  --   --   --   HGB 8.5* 8.1* 7.3* 7.8*  HCT 26.Merritt* 24.9* 21.7* 23.5*  MCV 99.Merritt 96.9 94.8 95.5  PLT 248 277 263 239   Cardiac Enzymes: No results for input(s): CKTOTAL, CKMB, CKMBINDEX, TROPONINI in the last 168 hours. CBG: Recent Labs  Lab 07/09/19 0615 07/09/19 1128 07/09/19 1636 07/09/19 2144 07/10/19 0626  GLUCAP 171* 208* 115* 214* 207*    Iron Studies:  Recent Labs    07/09/19 0614  IRON 32*  TIBC 217*  FERRITIN 544*   Studies/Results: VAS [place]  LOWER EXTREMITY VENOUS (DVT)  Result Date: 07/09/2019  Lower Venous Study Indications: Swelling.  Comparison Study: no prior Performing Technologist: June Leap RDMS, RVT  Examination Guidelines: A complete evaluation includes B-mode imaging, spectral Doppler, color Doppler, and power Doppler as needed of all accessible portions of each vessel. Bilateral testing is considered an integral part of a complete examination. Limited examinations for reoccurring indications may be performed as noted.  +---------+---------------+---------+-----------+----------+--------------+ RIGHT    CompressibilityPhasicitySpontaneityPropertiesThrombus Aging +---------+---------------+---------+-----------+----------+--------------+ CFV      Full           Yes      Yes                                 +---------+---------------+---------+-----------+----------+--------------+ SFJ      Full                                                         +---------+---------------+---------+-----------+----------+--------------+ FV Prox  Full                                                        +---------+---------------+---------+-----------+----------+--------------+ FV Mid   Full                                                        +---------+---------------+---------+-----------+----------+--------------+ FV DistalFull                                                        +---------+---------------+---------+-----------+----------+--------------+ PFV      Full                                                        +---------+---------------+---------+-----------+----------+--------------+ POP      Full           Yes      Yes                                 +---------+---------------+---------+-----------+----------+--------------+ PTV      Full                                                        +---------+---------------+---------+-----------+----------+--------------+ PERO     Full                                                        +---------+---------------+---------+-----------+----------+--------------+   +---------+---------------+---------+-----------+----------+--------------+  LEFT     CompressibilityPhasicitySpontaneityPropertiesThrombus Aging +---------+---------------+---------+-----------+----------+--------------+ CFV      Full           Yes      Yes                                 +---------+---------------+---------+-----------+----------+--------------+ SFJ      Full                                                        +---------+---------------+---------+-----------+----------+--------------+ FV Prox  Full                                                        +---------+---------------+---------+-----------+----------+--------------+ FV Mid   Full                                                         +---------+---------------+---------+-----------+----------+--------------+ FV DistalFull                                                        +---------+---------------+---------+-----------+----------+--------------+ PFV      Full                                                        +---------+---------------+---------+-----------+----------+--------------+ POP      Full           Yes      Yes                                 +---------+---------------+---------+-----------+----------+--------------+ PTV      Full                                                        +---------+---------------+---------+-----------+----------+--------------+ PERO     Full                                                        +---------+---------------+---------+-----------+----------+--------------+     Summary: Right: There is no evidence of deep vein thrombosis in the lower extremity. No cystic structure found in the popliteal fossa. Ultrasound characteristics of enlarged lymph nodes are noted in the groin. Left: There is no evidence of deep vein thrombosis in  the lower extremity. No cystic structure found in the popliteal fossa.  *See table(s) above for measurements and observations. Electronically signed by Harold Barban MD on 07/09/2019 at 4:45:22 PM.    Final     Medications: Infusions: . cefTRIAXone (ROCEPHIN)  IV Merritt g (07/09/19 1724)  . ferric gluconate (FERRLECIT/NULECIT) IV 250 mg (07/10/19 0846)  . heparin 1,400 Units/hr (07/10/19 0836)    Scheduled Medications: . sodium chloride   Intravenous Once  . amLODipine  5 mg Oral Daily  . aspirin EC  81 mg Oral QHS  . atorvastatin  20 mg Oral Daily  . azithromycin  500 mg Oral Daily  . carvedilol  25 mg Oral BID  . Chlorhexidine Gluconate Cloth  6 each Topical Q0600  . cloNIDine  0.Merritt mg Oral BID  . darbepoetin (ARANESP) injection - DIALYSIS  60 mcg Intravenous Q Sat-HD  . ferrous sulfate  325 mg Oral BID  .  finasteride  5 mg Oral QPM  . hydrALAZINE  100 mg Oral TID  . insulin aspart  0-9 Units Subcutaneous TID WC  . isosorbide dinitrate  30 mg Oral TID  . multivitamin with minerals  1 tablet Oral Daily  . ranolazine  1,000 mg Oral BID  . tamsulosin  0.4 mg Oral QPC supper  . ticagrelor  90 mg Oral BID    have reviewed scheduled and prn medications.  Physical Exam: General:NAD, comfortable, able to lie flat Heart:RRR, s1s2 nl Lungs:clear b/l, no crackle Abdomen:soft, Non-tender, non-distended Extremities: Bilateral lower extremity pitting edema Dialysis Access: Left AV Merritt Ray good thrill and bruit.  Lequan Dobratz Prasad Sherwood Castilla 07/10/2019,9:31 AM  LOS: 3 days  Pager: [phone]

## 2019-07-10 NOTE — Progress Notes (Signed)
PROGRESS NOTE  Ray Merritt:[id number] DOB: 20-Feb-1936 DOA: 07/07/2019 PCP: [name], MD  HPI/Recap of past 71 hours: 83 year old gentleman with history of hypertension, chronic kidney disease stage IV nearing hemodialysis, coronary artery disease, recent abnormal stress test, type 2 diabetes and CLL presented to the emergency room with shortness of breath, cough with blood-tinged sputum since morning.  He had some atypical chest pain.  Recently with weight gain and extremity edema.  He does have left AV arm AV fistula that has not been used yet. In the emergency room, febrile with temperature 100.6, blood pressures elevated, 2 L nasal cannula placed for comfort.  WBC 16.8.  Creatinine 6.61.  EKG revealed ST depression in inferior lateral leads.  Mildly elevated troponins.  Chest x-ray with fluid in the fissures, suspected atelectasis versus pneumonia. COVID-19 point-of-care and PCR test negative.  Admitted for IV antibiotics and heparin infusion with cardiology and nephrology consult.  Acute blood loss anemia requiring 1 unit irradiated RBC 12/19.  Progressing to ESRD first HD on 07/09/2019.  Nephrology working on outpatient HD spot.  07/10/19: Seen and examined.  Bedside RN reports significant weakness.  He states he felt very weak prior to coming to the hospital.  First HD on 12/19, next HD tomorrow 12/21.  Assessment/Plan: Principal Problem:   Sepsis ([place]) Active Problems:   CLL (chronic lymphocytic leukemia) (HCC)   Type 2 diabetes, HbA1c goal < 7% (HCC)   Acute renal failure superimposed on chronic kidney disease (HCC)   Acute on chronic combined systolic and diastolic CHF (congestive heart failure) (HCC)   Hypertensive urgency   Urinary tract infection   Abnormal EKG   Elevated troponin  Sepsis secondary to community-acquired pneumonia versus E. coli UTI Urine culture grew greater than 100,000 colonies of E. coli, pansensitive. On Rocephin. He is also on IV  azithromycin for community-acquired pneumonia. O2 saturation 99% on room air. Leukocytosis trending up Continue IV antibiotics  NSTEMI Followed by cardiology On aspirin and Lipitor Continue cardiac medications per cards recommendations No anginal symptoms at the time of this visit.  Paroxysmal A. fib CHA2DS2-VASc score 6 Okay to anticoagulate with Coumadin per cardiology Start on warfarin, managed by pharmacy Goal INR between 2 and 3  AKI on CKD 4 progressing to ESRD, first HD on 07/09/2019. Presented with creatinine greater than 6 Nephrology working on outpatient HD spot. Next HD 07/11/2019.  Anemia of chronic disease complicated by iron deficiency Received 1 unit of irradiated RBC on 07/09/2019 Iron studies suggestive of iron deficiency Hemoglobin 7.8 from 7.3 post 1 unit PRBC. No sign of overt bleeding Nephrology managing with ESA and iron repletion   Improving high anion gap metabolic acidosis likely in the setting of worsening renal function post hemodialysis Management with hemodialysis  BPH Stable Continue home medications Continue to monitor urine output  Acute on chronic combined systolic and diastolic CHF Continue strict I's and O's and daily weight Continue cardiac medications  Essential hypertension BP is at goal Continue home medications  CLL Stable  Physical debility PT OT to assess Continue PT OT with assistance and fall precautions.  DVT prophylaxis:  Coumadin bridged with Heparin infusion Code Status: Full code Family Communication: None  Disposition Plan: Pending clinical improvement, nephrology and cardiology signing off.   Consultants:   Cardiology, Dr. Einar Gip  Nephrology  Procedures:   None  Antimicrobials:   Rocephin, azithromycin, 07/07/2019---        Objective: Vitals:   07/10/19 0740 07/10/19 7412 07/10/19 0850 07/10/19  1130  BP: (!) 176/55 (!) 174/53 (!) 166/56 (!) 155/65  Pulse: 78 75  70  Resp: 20    19  Temp: 99 F (37.2 C)   99 F (37.2 C)  TempSrc: Oral   Oral  SpO2: 99% 99%  99%  Weight:      Height:        Intake/Output Summary (Last 24 hours) at 07/10/2019 1417 Last data filed at 07/10/2019 0800 Gross per 24 hour  Intake 1079.39 ml  Output 1300 ml  Net -220.61 ml   Filed Weights   07/09/19 0553 07/09/19 1240 07/10/19 0536  Weight: 100.1 kg 99.6 kg 98.5 kg    Exam:  . General: 83 y.o. year-old male well-developed well-nourished in no acute distress.  Alert and oriented x3. . Cardiovascular: Regular rate and rhythm no rubs or gallops no JVD or thyromegaly noted. Marland Kitchen Respiratory: Mild rales at bases no wheezing noted.  Poor respiratory effort.   . Abdomen: Soft nontender normal bowel sounds present.   . Musculoskeletal: Trace lower extremity edema bilaterally.  2 out of 4 pulses in all 4 extremities. Marland Kitchen Psychiatry: Mood is appropriate for condition and setting.   Data Reviewed: CBC: Recent Labs  Lab 07/07/19 1057 07/08/19 0336 07/09/19 0318 07/10/19 0301  WBC 16.8* 19.0* 14.9* 16.5*  NEUTROABS 11.3*  --   --   --   HGB 8.5* 8.1* 7.3* 7.8*  HCT 26.2* 24.9* 21.7* 23.5*  MCV 99.2 96.9 94.8 95.5  PLT 248 277 263 277   Basic Metabolic Panel: Recent Labs  Lab 07/07/19 1057 07/08/19 0336 07/09/19 0318 07/10/19 0301  NA 138 136 135 137  K 4.9 4.3 3.9 3.8  CL 111 107 104 104  CO2 15* 16* 17* 21*  GLUCOSE 97 131* 151* 184*  BUN 98* 95* 103* 70*  CREATININE 6.61* 5.83* 5.94* 4.65*  CALCIUM 9.2 9.2 8.9 8.6*  PHOS  --  4.2 4.2 3.4   GFR: Estimated Creatinine Clearance: 14.9 mL/min (A) (by C-G formula based on SCr of 4.65 mg/dL (H)). Liver Function Tests: Recent Labs  Lab 07/07/19 1057 07/08/19 0336 07/09/19 0318 07/10/19 0301  AST 13*  --   --   --   ALT 14  --   --   --   ALKPHOS 61  --   --   --   BILITOT 0.6  --   --   --   PROT 6.4*  --   --   --   ALBUMIN 3.5 3.1* 2.7* 2.6*   No results for input(s): LIPASE, AMYLASE in the last 168  hours. No results for input(s): AMMONIA in the last 168 hours. Coagulation Profile: Recent Labs  Lab 07/10/19 0836  INR 1.1   Cardiac Enzymes: No results for input(s): CKTOTAL, CKMB, CKMBINDEX, TROPONINI in the last 168 hours. BNP (last 3 results) No results for input(s): PROBNP in the last 8760 hours. HbA1C: Recent Labs    07/07/19 1650  HGBA1C 5.4   CBG: Recent Labs  Lab 07/09/19 1128 07/09/19 1636 07/09/19 2144 07/10/19 0626 07/10/19 1129  GLUCAP 208* 115* 214* 207* 185*   Lipid Profile: No results for input(s): CHOL, HDL, LDLCALC, TRIG, CHOLHDL, LDLDIRECT in the last 72 hours. Thyroid Function Tests: No results for input(s): TSH, T4TOTAL, FREET4, T3FREE, THYROIDAB in the last 72 hours. Anemia Panel: Recent Labs    07/09/19 0614  VITAMINB12 637  FERRITIN 544*  TIBC 217*  IRON 32*   Urine analysis:    Component Value  Date/Time   COLORURINE YELLOW 07/07/2019 1409   APPEARANCEUR HAZY (A) 07/07/2019 1409   LABSPEC 1.014 07/07/2019 1409   PHURINE 5.0 07/07/2019 1409   GLUCOSEU NEGATIVE 07/07/2019 1409   HGBUR NEGATIVE 07/07/2019 1409   BILIRUBINUR NEGATIVE 07/07/2019 1409   KETONESUR NEGATIVE 07/07/2019 1409   PROTEINUR 100 (A) 07/07/2019 1409   UROBILINOGEN 0.2 10/07/2011 1203   NITRITE NEGATIVE 07/07/2019 1409   LEUKOCYTESUR SMALL (A) 07/07/2019 1409   Sepsis Labs: @LABRCNTIP (procalcitonin:4,lacticidven:4)  ) Recent Results (from the past 240 hour(s))  Blood Culture (routine x 2)     Status: None (Preliminary result)   Collection Time: 07/07/19 10:57 AM   Specimen: BLOOD RIGHT ARM  Result Value Ref Range Status   Specimen Description BLOOD RIGHT ARM  Final   Special Requests   Final    BOTTLES DRAWN AEROBIC AND ANAEROBIC Blood Culture adequate volume   Culture   Final    NO GROWTH 3 DAYS Performed at [facility], [place] [address] [place], [place] [address]    Report Status PENDING  Incomplete  Blood Culture (routine x 2)     Status: None  (Preliminary result)   Collection Time: 07/07/19 11:49 AM   Specimen: BLOOD RIGHT FOREARM  Result Value Ref Range Status   Specimen Description BLOOD RIGHT FOREARM  Final   Special Requests   Final    BOTTLES DRAWN AEROBIC ONLY Blood Culture adequate volume   Culture   Final    NO GROWTH 3 DAYS Performed at [facility], [place] [address] [place], [place] [address]    Report Status PENDING  Incomplete  Respiratory Panel by RT PCR (Flu A&B, Covid) - Nasopharyngeal Swab     Status: None   Collection Time: 07/07/19  1:47 PM   Specimen: Nasopharyngeal Swab  Result Value Ref Range Status   SARS Coronavirus 2 by RT PCR NEGATIVE NEGATIVE Final    Comment: (NOTE) SARS-CoV-2 target nucleic acids are NOT DETECTED. The SARS-CoV-2 RNA is generally detectable in upper respiratoy specimens during the acute phase of infection. The lowest concentration of SARS-CoV-2 viral copies this assay can detect is 131 copies/mL. A negative result does not preclude SARS-Cov-2 infection and should not be used as the sole basis for treatment or other patient management decisions. A negative result may occur with  improper specimen collection/handling, submission of specimen other than nasopharyngeal swab, presence of viral mutation(s) within the areas targeted by this assay, and inadequate number of viral copies (<131 copies/mL). A negative result must be combined with clinical observations, patient history, and epidemiological information. The expected result is Negative. Fact Sheet for Patients:  PinkCheek.be Fact Sheet for Healthcare Providers:  GravelBags.it This test is not yet ap proved or cleared by the [place] FDA and  has been authorized for detection and/or diagnosis of SARS-CoV-2 by FDA under an Emergency Use Authorization (EUA). This EUA will remain  in effect (meaning this test can be used) for the duration of the COVID-19  declaration under Section 564(b)(1) of the Act, 21 U.S.C. section 360bbb-3(b)(1), unless the authorization is terminated or revoked sooner.    Influenza A by PCR NEGATIVE NEGATIVE Final   Influenza B by PCR NEGATIVE NEGATIVE Final    Comment: (NOTE) The Xpert Xpress SARS-CoV-2/FLU/RSV assay is intended as an aid in  the diagnosis of influenza from Nasopharyngeal swab specimens and  should not be used as a sole basis for treatment. Nasal washings and  aspirates are unacceptable for Xpert Xpress  SARS-CoV-2/FLU/RSV  testing. Fact Sheet for Patients: PinkCheek.be Fact Sheet for Healthcare Providers: GravelBags.it This test is not yet approved or cleared by the [place] FDA and  has been authorized for detection and/or diagnosis of SARS-CoV-2 by  FDA under an Emergency Use Authorization (EUA). This EUA will remain  in effect (meaning this test can be used) for the duration of the  Covid-19 declaration under Section 564(b)(1) of the Act, 21  U.S.C. section 360bbb-3(b)(1), unless the authorization is  terminated or revoked. Performed at [facility], [place] [address], [place], [place] [address]   Respiratory Panel by PCR     Status: None   Collection Time: 07/07/19  1:47 PM   Specimen: Nasopharyngeal Swab; Respiratory  Result Value Ref Range Status   Adenovirus NOT DETECTED NOT DETECTED Final   Coronavirus 229E NOT DETECTED NOT DETECTED Final    Comment: (NOTE) The Coronavirus on the Respiratory Panel, DOES NOT test for the novel  Coronavirus (2019 nCoV)    Coronavirus HKU1 NOT DETECTED NOT DETECTED Final   Coronavirus NL63 NOT DETECTED NOT DETECTED Final   Coronavirus OC43 NOT DETECTED NOT DETECTED Final   Metapneumovirus NOT DETECTED NOT DETECTED Final   Rhinovirus / Enterovirus NOT DETECTED NOT DETECTED Final   Influenza A NOT DETECTED NOT DETECTED Final   Influenza B NOT DETECTED NOT DETECTED Final    Parainfluenza Virus 1 NOT DETECTED NOT DETECTED Final   Parainfluenza Virus 2 NOT DETECTED NOT DETECTED Final   Parainfluenza Virus 3 NOT DETECTED NOT DETECTED Final   Parainfluenza Virus 4 NOT DETECTED NOT DETECTED Final   Respiratory Syncytial Virus NOT DETECTED NOT DETECTED Final   Bordetella pertussis NOT DETECTED NOT DETECTED Final   Chlamydophila pneumoniae NOT DETECTED NOT DETECTED Final   Mycoplasma pneumoniae NOT DETECTED NOT DETECTED Final    Comment: Performed at [facility], [place]. [address], [place], [place] [address]  Urine culture     Status: Abnormal   Collection Time: 07/07/19  2:13 PM   Specimen: Urine, Random  Result Value Ref Range Status   Specimen Description URINE, RANDOM  Final   Special Requests   Final    NONE Performed at [facility], [place] [address] [place], [place] [address]    Culture >=100,000 COLONIES/mL ESCHERICHIA COLI (A)  Final   Report Status 07/09/2019 FINAL  Final   Organism ID, Bacteria ESCHERICHIA COLI (A)  Final      Susceptibility   Escherichia coli - MIC*    AMPICILLIN 8 SENSITIVE Sensitive     CEFAZOLIN <=4 SENSITIVE Sensitive     CEFTRIAXONE <=1 SENSITIVE Sensitive     CIPROFLOXACIN <=0.25 SENSITIVE Sensitive     GENTAMICIN <=1 SENSITIVE Sensitive     IMIPENEM <=0.25 SENSITIVE Sensitive     NITROFURANTOIN <=16 SENSITIVE Sensitive     TRIMETH/SULFA <=20 SENSITIVE Sensitive     AMPICILLIN/SULBACTAM 4 SENSITIVE Sensitive     PIP/TAZO <=4 SENSITIVE Sensitive     * >=100,000 COLONIES/mL ESCHERICHIA COLI  Expectorated sputum assessment w rflx to resp cult     Status: None   Collection Time: 07/09/19  1:26 AM   Specimen: SPU  Result Value Ref Range Status   Specimen Description SPUTUM  Final   Special Requests NONE  Final   Sputum evaluation   Final    THIS SPECIMEN IS ACCEPTABLE FOR SPUTUM CULTURE Performed at [facility], [address], [place], [place] [address]    Report Status  07/09/2019 FINAL  Final    Culture, respiratory     Status: None (Preliminary result)   Collection Time: 07/09/19  1:26 AM   Specimen: SPU  Result Value Ref Range Status   Specimen Description SPUTUM  Final   Special Requests NONE Reflexed from [id number]  Final   Gram Stain   Final    MODERATE WBC PRESENT, PREDOMINANTLY PMN FEW GRAM POSITIVE COCCI IN CHAINS FEW GRAM NEGATIVE RODS    Culture   Final    CULTURE REINCUBATED FOR BETTER GROWTH Performed at [facility], [place] [address] [place], [place] [address]    Report Status PENDING  Incomplete      Studies: No results found.  Scheduled Meds: . sodium chloride   Intravenous Once  . amLODipine  5 mg Oral Daily  . aspirin EC  81 mg Oral QHS  . atorvastatin  20 mg Oral Daily  . azithromycin  500 mg Oral Daily  . carvedilol  25 mg Oral BID  . Chlorhexidine Gluconate Cloth  6 each Topical Q0600  . Chlorhexidine Gluconate Cloth  6 each Topical Q0600  . cloNIDine  0.2 mg Oral BID  . darbepoetin (ARANESP) injection - DIALYSIS  60 mcg Intravenous Q Sat-HD  . ferrous sulfate  325 mg Oral BID  . finasteride  5 mg Oral QPM  . hydrALAZINE  100 mg Oral TID  . insulin aspart  0-9 Units Subcutaneous TID WC  . isosorbide dinitrate  30 mg Oral TID  . multivitamin with minerals  1 tablet Oral Daily  . ranolazine  1,000 mg Oral BID  . tamsulosin  0.4 mg Oral QPC supper  . ticagrelor  90 mg Oral BID  . warfarin  7.5 mg Oral ONCE-1800  . Warfarin - Pharmacist Dosing Inpatient   Does not apply q1800    Continuous Infusions: . cefTRIAXone (ROCEPHIN)  IV 2 g (07/10/19 1257)  . ferric gluconate (FERRLECIT/NULECIT) IV 250 mg (07/10/19 0846)  . heparin 1,400 Units/hr (07/10/19 0836)     LOS: 3 days     Kayleen Memos, MD [facility] Pager [phone]  If 7PM-7AM, please contact night-coverage [email or web address] Password [email or web address] 07/10/2019, 2:17 PM

## 2019-07-10 NOTE — Progress Notes (Signed)
Pt was unable to stand for am weight . Had hard time sitting on side of the bed . Bilateral legs very weak. Call bell within reach. Will continue to monitor.

## 2019-07-10 NOTE — Progress Notes (Signed)
Pt began to express discomfort with breezing this afternoon.  Pt's O2 stable at 96% RA, respiration slightly tachypnic at 25.  Lower lung sounds clear with some mild upper bronchial wheezing.  No chest pain with breathing or coughing.  Pt is mildly febrile at 99.4. All other vitals are normal relative to Pt's baseline.  Hall DO came to room and evaluated Pt.  Orders placed for chest X-ray, breathing treatment, 12-Lead EKG, lab orders, and lasix IV ordered.  Pt will continue to be monitored.

## 2019-07-11 ENCOUNTER — Other Ambulatory Visit: Payer: Self-pay

## 2019-07-11 ENCOUNTER — Encounter (HOSPITAL_COMMUNITY): Payer: Self-pay | Admitting: Internal Medicine

## 2019-07-11 LAB — BPAM RBC
Blood Product Expiration Date: 202101032359
ISSUE DATE / TIME: 202012192031
Unit Type and Rh: 6200

## 2019-07-11 LAB — PROTIME-INR
INR: 1.2 (ref 0.8–1.2)
Prothrombin Time: 15.5 seconds — ABNORMAL HIGH (ref 11.4–15.2)

## 2019-07-11 LAB — RENAL FUNCTION PANEL
Albumin: 2.5 g/dL — ABNORMAL LOW (ref 3.5–5.0)
Anion gap: 16 — ABNORMAL HIGH (ref 5–15)
BUN: 82 mg/dL — ABNORMAL HIGH (ref 8–23)
CO2: 17 mmol/L — ABNORMAL LOW (ref 22–32)
Calcium: 8.9 mg/dL (ref 8.9–10.3)
Chloride: 101 mmol/L (ref 98–111)
Creatinine, Ser: 5.38 mg/dL — ABNORMAL HIGH (ref 0.61–1.24)
GFR calc Af Amer: 11 mL/min — ABNORMAL LOW (ref >60)
GFR calc non Af Amer: 9 mL/min — ABNORMAL LOW (ref >60)
Glucose, Bld: 155 mg/dL — ABNORMAL HIGH (ref 70–99)
Phosphorus: 5 mg/dL — ABNORMAL HIGH (ref 2.5–4.6)
Potassium: 4.1 mmol/L (ref 3.5–5.1)
Sodium: 134 mmol/L — ABNORMAL LOW (ref 135–145)

## 2019-07-11 LAB — TYPE AND SCREEN
ABO/RH(D): A POS
Antibody Screen: NEGATIVE
Unit division: 0

## 2019-07-11 LAB — GLUCOSE, CAPILLARY
Glucose-Capillary: 167 mg/dL — ABNORMAL HIGH (ref 70–99)
Glucose-Capillary: 154 mg/dL — ABNORMAL HIGH (ref 70–99)
Glucose-Capillary: 169 mg/dL — ABNORMAL HIGH (ref 70–99)
Glucose-Capillary: 138 mg/dL — ABNORMAL HIGH (ref 70–99)

## 2019-07-11 LAB — CULTURE, RESPIRATORY W GRAM STAIN: Culture: NORMAL

## 2019-07-11 LAB — TROPONIN I (HIGH SENSITIVITY)
Troponin I (High Sensitivity): 69 ng/L — ABNORMAL HIGH (ref <18)
Troponin I (High Sensitivity): 90 ng/L — ABNORMAL HIGH (ref <18)
Troponin I (High Sensitivity): 199 ng/L (ref <18)
Troponin I (High Sensitivity): 229 ng/L (ref <18)

## 2019-07-11 LAB — HEPARIN LEVEL (UNFRACTIONATED): Heparin Unfractionated: 0.39 IU/mL (ref 0.30–0.70)

## 2019-07-11 LAB — PROCALCITONIN: Procalcitonin: 1.45 ng/mL

## 2019-07-11 LAB — EXPECTORATED SPUTUM ASSESSMENT W GRAM STAIN, RFLX TO RESP C

## 2019-07-11 LAB — CBC
HCT: 24.5 % — ABNORMAL LOW (ref 39.0–52.0)
Hemoglobin: 8 g/dL — ABNORMAL LOW (ref 13.0–17.0)
MCH: 32.1 pg (ref 26.0–34.0)
MCHC: 32.7 g/dL (ref 30.0–36.0)
MCV: 98.4 fL (ref 80.0–100.0)
Platelets: 253 10*3/uL (ref 150–400)
RBC: 2.49 MIL/uL — ABNORMAL LOW (ref 4.22–5.81)
RDW: 13.3 % (ref 11.5–15.5)
WBC: 20.7 10*3/uL — ABNORMAL HIGH (ref 4.0–10.5)
nRBC: 0.1 % (ref 0.0–0.2)

## 2019-07-11 LAB — LACTIC ACID, PLASMA
Lactic Acid, Venous: 0.7 mmol/L (ref 0.5–1.9)
Lactic Acid, Venous: 0.8 mmol/L (ref 0.5–1.9)

## 2019-07-11 LAB — MRSA PCR SCREENING: MRSA by PCR: NEGATIVE

## 2019-07-11 LAB — LEGIONELLA PNEUMOPHILA SEROGP 1 UR AG: L. pneumophila Serogp 1 Ur Ag: NEGATIVE

## 2019-07-11 MED ORDER — PIPERACILLIN-TAZOBACTAM 3.375 G IVPB
3.3750 g | Freq: Two times a day (BID) | INTRAVENOUS | Status: DC
  Administered 2019-07-11 – 2019-07-12 (×3): 3.375 g via INTRAVENOUS
  Filled 2019-07-11 (×3): qty 50

## 2019-07-11 MED ORDER — COUMADIN BOOK
Freq: Once | Status: AC
  Filled 2019-07-11: qty 1

## 2019-07-11 MED ORDER — ISOSORBIDE DINITRATE 30 MG PO TABS
30.0000 mg | ORAL_TABLET | Freq: Three times a day (TID) | ORAL | Status: DC
  Administered 2019-07-12 (×2): 30 mg via ORAL
  Filled 2019-07-11 (×3): qty 1

## 2019-07-11 MED ORDER — FUROSEMIDE 10 MG/ML IJ SOLN
60.0000 mg | Freq: Once | INTRAMUSCULAR | Status: AC
  Administered 2019-07-11: 60 mg via INTRAVENOUS
  Filled 2019-07-11: qty 6

## 2019-07-11 MED ORDER — CHLORHEXIDINE GLUCONATE CLOTH 2 % EX PADS
6.0000 | MEDICATED_PAD | Freq: Every day | CUTANEOUS | Status: DC
  Administered 2019-07-11 – 2019-07-15 (×5): 6 via TOPICAL

## 2019-07-11 MED ORDER — CLONIDINE HCL 0.1 MG PO TABS
0.1000 mg | ORAL_TABLET | Freq: Two times a day (BID) | ORAL | Status: DC
  Administered 2019-07-11 – 2019-07-26 (×23): 0.1 mg via ORAL
  Filled 2019-07-11 (×26): qty 1

## 2019-07-11 MED ORDER — WARFARIN SODIUM 7.5 MG PO TABS
7.5000 mg | ORAL_TABLET | Freq: Once | ORAL | Status: AC
  Administered 2019-07-11: 7.5 mg via ORAL
  Filled 2019-07-11: qty 1

## 2019-07-11 MED ORDER — HYDRALAZINE HCL 50 MG PO TABS
100.0000 mg | ORAL_TABLET | Freq: Three times a day (TID) | ORAL | Status: DC
  Administered 2019-07-12: 100 mg via ORAL
  Filled 2019-07-11 (×2): qty 2

## 2019-07-11 MED ORDER — CARVEDILOL 25 MG PO TABS
25.0000 mg | ORAL_TABLET | Freq: Two times a day (BID) | ORAL | Status: DC
  Administered 2019-07-12: 25 mg via ORAL
  Filled 2019-07-11 (×2): qty 1

## 2019-07-11 NOTE — Progress Notes (Signed)
Inpatient Rehab Admissions:  Inpatient Rehab Consult received.  I met with patient at the bedside for rehabilitation assessment and to discuss goals and expectations of an inpatient rehab admission.  When asked whether he felt like he needed post-acute rehab he said "most definitely."  Pt will need to be clipped to outpatient HD center prior to possible CIR admission.  Will call wife to discuss CIR and follow along.   Signed: Shann Medal, PT, DPT Admissions Coordinator [phone] 07/11/19  1:11 PM

## 2019-07-11 NOTE — Evaluation (Signed)
Occupational Therapy Evaluation Patient Details Name: Ray Merritt MRN: [id number] DOB: 1936-03-11 Today's Date: 07/11/2019    History of Present Illness 83yo male c/o SOB with blood tinged sputum, nausea/vomiting. Covid negative x2, flu panel negative. Admitted with sepsis secondary to CAP, acute on chronic CHF. PMH DM, CAD, CKD, CA, hx shoulder surgery, cardac cath   Clinical Impression   Pt admitted with the above diagnoses and presents with below problem list. Pt will benefit from continued acute OT to address the below listed deficits and maximize independence with basic ADLs prior to d/c to venue below. At baseline, pt is independent with basic ADLs. Pt currently mod - max A with most UB ADLs. max A with bed mobility, min A to sit EOB a few minutes. Unable to further assess functional transfers as pt currently requiring +2 assist and only +1 assist available at time of eval. Pt pleasant and willing to work with therapy despite feeling very poorly. Feel he would benefit from intensive rehab at time of d/c.      Follow Up Recommendations  CIR    Equipment Recommendations  Other (comment)(defer to next venue)    Recommendations for Other Services       Precautions / Restrictions Precautions Precautions: Fall;Other (comment) Precaution Comments: watch vitals, B UE weakness Restrictions Weight Bearing Restrictions: No      Mobility Bed Mobility Overal bed mobility: Needs Assistance Bed Mobility: Supine to Sit;Sit to Supine     Supine to sit: Max assist;HOB elevated Sit to supine: Max assist   General bed mobility comments: Assist to fully advance BLE off EOB and to powerup trunk. Pt quickly fatigued into partially supine after sitting EOB a few minutes. Assist to advance BLE up onto bed. +2 assist to scoot up in bed.  Transfers Overall transfer level: Needs assistance   Transfers: Sit to/from Stand Sit to Stand: Total assist;From elevated surface         General  transfer comment: unable to safely assess with +1 assist. Pt will need +2 assist.    Balance Overall balance assessment: Needs assistance Sitting-balance support: Bilateral upper extremity supported;Feet supported Sitting balance-Leahy Scale: Poor Sitting balance - Comments: Min guard-MinA to correct posterior lean at EOB. Fatigued suddenly back into partial supine position (feet still off bed).  Postural control: Posterior lean                                 ADL either performed or assessed with clinical judgement   ADL Overall ADL's : Needs assistance/impaired Eating/Feeding: Set up;Sitting   Grooming: Minimal assistance;Sitting   Upper Body Bathing: Moderate assistance;Sitting   Lower Body Bathing: Maximal assistance;Sitting/lateral leans;+2 for physical assistance   Upper Body Dressing : Moderate assistance;Sitting   Lower Body Dressing: Maximal assistance;+2 for physical assistance;Sitting/lateral leans   Toilet Transfer: +2 for physical assistance   Toileting- Clothing Manipulation and Hygiene: +2 for physical assistance   Tub/ Shower Transfer: +2 for physical assistance     General ADL Comments: Pt completed bed mobility, sat EOB a few minutes at min guard- min A level. Fatigued quickly back into supine. For UB ADLs pt would need supported seating.      Vision         Perception     Praxis      Pertinent Vitals/Pain Pain Assessment: Faces Faces Pain Scale: Hurts little more Pain Location: generalized with movement Pain Descriptors / Indicators:  Discomfort Pain Intervention(s): Monitored during session;Limited activity within patient's tolerance     Hand Dominance     Extremity/Trunk Assessment Upper Extremity Assessment Upper Extremity Assessment: Generalized weakness   Lower Extremity Assessment Lower Extremity Assessment: Defer to PT evaluation   Cervical / Trunk Assessment Cervical / Trunk Assessment: Kyphotic   Communication  Communication Communication: No difficulties   Cognition Arousal/Alertness: Awake/alert Behavior During Therapy: WFL for tasks assessed/performed Overall Cognitive Status: Within Functional Limits for tasks assessed                                     General Comments  Pt pleasant and willing to work with therapist despite feeling very poorly.     Exercises     Shoulder Instructions      Home Living Family/patient expects to be discharged to:: Private residence Living Arrangements: Spouse/significant other;Other (Comment)(daughter and grand-daughter also easily available) Available Help at Discharge: Family;Available 24 hours/day Type of Home: House Home Access: Stairs to enter [employer] of Steps: 7 in front with B rails, 3 in the back with U rail   Home Layout: One level     Bathroom Shower/Tub: Occupational psychologist: Standard     Home Equipment: Grab bars - toilet;Grab bars - tub/shower          Prior Functioning/Environment Level of Independence: Independent                 OT Problem List: Decreased strength;Decreased activity tolerance;Impaired balance (sitting and/or standing);Decreased knowledge of use of DME or AE;Decreased knowledge of precautions;Cardiopulmonary status limiting activity;Pain;Increased edema      OT Treatment/Interventions: Self-care/ADL training;Therapeutic exercise;Energy conservation;DME and/or AE instruction;Therapeutic activities;Patient/family education;Balance training    OT Goals(Current goals can be found in the care plan section) Acute Rehab OT Goals Patient Stated Goal: get stronger, go to rehab OT Goal Formulation: With patient Time For Goal Achievement: 07/25/19 Potential to Achieve Goals: Good ADL Goals Pt Will Perform Grooming: with set-up;with supervision;sitting Pt Will Perform Upper Body Bathing: with min guard assist;sitting Pt Will Perform Lower Body Bathing: with min  assist;sitting/lateral leans;sit to/from stand Pt Will Transfer to Toilet: with min assist;ambulating;stand pivot transfer;bedside commode Pt Will Perform Toileting - Clothing Manipulation and hygiene: with min assist;sit to/from stand;sitting/lateral leans  OT Frequency: Min 2X/week   Barriers to D/C:            Co-evaluation              AM-PAC OT "6 Clicks" Daily Activity     Outcome Measure Help from another person eating meals?: A Little Help from another person taking care of personal grooming?: A Lot Help from another person toileting, which includes using toliet, bedpan, or urinal?: Total Help from another person bathing (including washing, rinsing, drying)?: Total Help from another person to put on and taking off regular upper body clothing?: A Lot Help from another person to put on and taking off regular lower body clothing?: Total 6 Click Score: 10   End of Session Equipment Utilized During Treatment: Oxygen  Activity Tolerance: Patient limited by fatigue;Patient limited by lethargy Patient left: in bed;with call bell/phone within reach  OT Visit Diagnosis: Unsteadiness on feet (R26.81);Other abnormalities of gait and mobility (R26.89);Muscle weakness (generalized) (M62.81);Pain                Time: 1100-1114 OT Time Calculation (min): 14 min Charges:  OT General Charges $OT Visit: 1 Visit OT Evaluation $OT Eval Low Complexity: [place], OT Acute Rehabilitation Services Pager: [phone] Office: [phone]   Hortencia Pilar 07/11/2019, 1:32 PM

## 2019-07-11 NOTE — Progress Notes (Signed)
Received patient to room 4e07 from Hemodialysis, patient altert and oriented without complaints. Oriented to room and call bell within reach.   Tawanna Sat, RN

## 2019-07-11 NOTE — Progress Notes (Signed)
PROGRESS NOTE  Ray Merritt LFY:[id number] DOB: 05-09-1936 DOA: 07/07/2019 PCP: [name], MD  HPI/Recap of past 71 hours: 83 year old gentleman with history of hypertension, chronic kidney disease stage IV nearing hemodialysis, coronary artery disease, recent abnormal stress test, type 2 diabetes and CLL presented to the emergency room with shortness of breath, cough with blood-tinged sputum since morning.  He had some atypical chest pain.  Recently with weight gain and extremity edema.  He does have left AV arm AV fistula that has not been used yet. In the emergency room, febrile with temperature 100.6, blood pressures elevated, 2 L nasal cannula placed for comfort.  WBC 16.8.  Creatinine 6.61.  EKG revealed ST depression in inferior lateral leads.  Mildly elevated troponins.  Chest x-ray with fluid in the fissures, suspected atelectasis versus pneumonia. COVID-19 point-of-care and PCR test negative.  Admitted for IV antibiotics and heparin infusion with cardiology and nephrology consult.  Acute blood loss anemia requiring 1 unit irradiated RBC 12/19.  Progressing to ESRD first HD on 07/09/2019.  Nephrology working on outpatient HD spot.  07/11/19: Seen and examined.  Weak appearing.  Appears volume overloaded.  Plan hemodialysis today.  Will diurese as BP will tolerate as he is awaiting hemodialysis.   Assessment/Plan: Principal Problem:   Sepsis ([place]) Active Problems:   CLL (chronic lymphocytic leukemia) (HCC)   Type 2 diabetes, HbA1c goal < 7% (HCC)   Acute renal failure superimposed on chronic kidney disease (HCC)   Acute on chronic combined systolic and diastolic CHF (congestive heart failure) (HCC)   Hypertensive urgency   Urinary tract infection   Abnormal EKG   Elevated troponin  Sepsis secondary to community-acquired pneumonia versus E. coli UTI Urine culture grew greater than 100,000 colonies of E. coli, pansensitive. On Rocephin. He is also on IV  azithromycin for community-acquired pneumonia. T-max 100.2 Worsening leukocytosis and procalcitonin 1.45 while on a azithromycin and Rocephin Switched to Zosyn  New ESRD on HD First HD on 07/09/2019 Next HD planned on 07/11/2019 then 07/12/2019. Volume status metabolic acidosis managed with hemodialysis Nephrology working on outpatient HD spot Continue daily renal panel  Hypervolemic hyponatremia Sodium 134 Volume managed with hemodialysis Plan hemodialysis today  Volume overload in the setting of new ESRD Management Per nephrology  Elevated troponin likely demand ischemia in the setting of hypoxia Troponin 90, trend Continue aspirin and Lipitor  NSTEMI Followed by cardiology On aspirin and Lipitor Continue cardiac medications per cards recommendations  Paroxysmal A. fib CHA2DS2-VASc score 6 Anticoagulate with Coumadin per cardiology Continue warfarin, managed by pharmacy Goal INR between 2 and 3 INR subtherapeutic 1.2  Subtherapeutic INR Management per above Daily INR  Anemia of chronic disease complicated by iron deficiency Received 1 unit of irradiated RBC on 07/09/2019 Hemoglobin trending up, stable at 8.0.   No sign of overt bleeding Nephrology managing with ESA and iron repletion.  BPH Stable Continue home medications Continue to monitor urine output  Acute on chronic combined systolic and diastolic CHF Continue strict I's and O's and daily weight Continue cardiac medications Volume managed by hemodialysis  Essential hypertension Likely will need to reduce doses of antihypertensives Continue to closely monitor vital sign  CLL No acute issues   Physical debility PT OT to assess Continue PT OT with assistance and fall precautions.  DVT prophylaxis:  Coumadin bridged with Heparin infusion Code Status: Full code Family Communication: None  Disposition Plan: Pending clinical improvement, nephrology and cardiology signing off.   Consultants:  Cardiology, Dr. Einar Gip  Nephrology  Procedures:   None  Antimicrobials:   Rocephin, azithromycin, 07/07/2019--- 07/11/19  Zosyn 07/11/2019---         Objective: Vitals:   07/11/19 0530 07/11/19 0615 07/11/19 0833 07/11/19 1142  BP:   (!) 170/58 (!) 175/57  Pulse: 71  72 81  Resp: 19 19 (!) 21 (!) 22  Temp:  99.5 F (37.5 C) 99.1 F (37.3 C) 100.2 F (37.9 C)  TempSrc:  Oral Oral Oral  SpO2: 97%  94% 97%  Weight: 100.6 kg     Height:        Intake/Output Summary (Last 24 hours) at 07/11/2019 1526 Last data filed at 07/11/2019 0530 Gross per 24 hour  Intake 471.99 ml  Output 900 ml  Net -428.01 ml   Filed Weights   07/09/19 1240 07/10/19 0536 07/11/19 0530  Weight: 99.6 kg 98.5 kg 100.6 kg    Exam:  . General: 83 y.o. year-old male well-developed well-nourished in no distress.  Alert and oriented x3.   . Cardiovascular: Regular rate and rhythm no rubs or gallops no JVD or thyromegaly noted.  Respiratory: Mild rales at bases no wheezing noted.  Poor inspiratory effort.   . Abdomen: Soft nontender normal bowel sounds present . Musculoskeletal: Lower extremity edema bilaterally. Psychiatry: Mood is appropriate for condition and setting.  Data Reviewed: CBC: Recent Labs  Lab 07/07/19 1057 07/08/19 0336 07/09/19 0318 07/10/19 0301 07/11/19 0319  WBC 16.8* 19.0* 14.9* 16.5* 20.7*  NEUTROABS 11.3*  --   --   --   --   HGB 8.5* 8.1* 7.3* 7.8* 8.0*  HCT 26.2* 24.9* 21.7* 23.5* 24.5*  MCV 99.2 96.9 94.8 95.5 98.4  PLT 248 277 263 239 509   Basic Metabolic Panel: Recent Labs  Lab 07/07/19 1057 07/08/19 0336 07/09/19 0318 07/10/19 0301 07/11/19 0319  NA 138 136 135 137 134*  K 4.9 4.3 3.9 3.8 4.1  CL 111 107 104 104 101  CO2 15* 16* 17* 21* 17*  GLUCOSE 97 131* 151* 184* 155*  BUN 98* 95* 103* 70* 82*  CREATININE 6.61* 5.83* 5.94* 4.65* 5.38*  CALCIUM 9.2 9.2 8.9 8.6* 8.9  PHOS  --  4.2 4.2 3.4 5.0*   GFR: Estimated Creatinine  Clearance: 12.9 mL/min (A) (by C-G formula based on SCr of 5.38 mg/dL (H)). Liver Function Tests: Recent Labs  Lab 07/07/19 1057 07/08/19 0336 07/09/19 0318 07/10/19 0301 07/11/19 0319  AST 13*  --   --   --   --   ALT 14  --   --   --   --   ALKPHOS 61  --   --   --   --   BILITOT 0.6  --   --   --   --   PROT 6.4*  --   --   --   --   ALBUMIN 3.5 3.1* 2.7* 2.6* 2.5*   No results for input(s): LIPASE, AMYLASE in the last 168 hours. No results for input(s): AMMONIA in the last 168 hours. Coagulation Profile: Recent Labs  Lab 07/10/19 0836 07/11/19 0319  INR 1.1 1.2   Cardiac Enzymes: No results for input(s): CKTOTAL, CKMB, CKMBINDEX, TROPONINI in the last 168 hours. BNP (last 3 results) No results for input(s): PROBNP in the last 8760 hours. HbA1C: No results for input(s): HGBA1C in the last 72 hours. CBG: Recent Labs  Lab 07/10/19 1129 07/10/19 1722 07/10/19 2158 07/11/19 0612 07/11/19 1144  GLUCAP 185* 205*  140* 167* 154*   Lipid Profile: No results for input(s): CHOL, HDL, LDLCALC, TRIG, CHOLHDL, LDLDIRECT in the last 72 hours. Thyroid Function Tests: No results for input(s): TSH, T4TOTAL, FREET4, T3FREE, THYROIDAB in the last 72 hours. Anemia Panel: Recent Labs    07/09/19 0614  VITAMINB12 637  FERRITIN 544*  TIBC 217*  IRON 32*   Urine analysis:    Component Value Date/Time   COLORURINE YELLOW 07/07/2019 1409   APPEARANCEUR HAZY (A) 07/07/2019 1409   LABSPEC 1.014 07/07/2019 1409   PHURINE 5.0 07/07/2019 1409   GLUCOSEU NEGATIVE 07/07/2019 1409   HGBUR NEGATIVE 07/07/2019 1409   BILIRUBINUR NEGATIVE 07/07/2019 1409   KETONESUR NEGATIVE 07/07/2019 1409   PROTEINUR 100 (A) 07/07/2019 1409   UROBILINOGEN 0.2 10/07/2011 1203   NITRITE NEGATIVE 07/07/2019 1409   LEUKOCYTESUR SMALL (A) 07/07/2019 1409   Sepsis Labs: @LABRCNTIP (procalcitonin:4,lacticidven:4)  ) Recent Results (from the past 240 hour(s))  Blood Culture (routine x 2)     Status:  None (Preliminary result)   Collection Time: 07/07/19 10:57 AM   Specimen: BLOOD RIGHT ARM  Result Value Ref Range Status   Specimen Description BLOOD RIGHT ARM  Final   Special Requests   Final    BOTTLES DRAWN AEROBIC AND ANAEROBIC Blood Culture adequate volume   Culture   Final    NO GROWTH 4 DAYS Performed at [facility], [place] [address], [place], [place] [address]    Report Status PENDING  Incomplete  Blood Culture (routine x 2)     Status: None (Preliminary result)   Collection Time: 07/07/19 11:49 AM   Specimen: BLOOD RIGHT FOREARM  Result Value Ref Range Status   Specimen Description BLOOD RIGHT FOREARM  Final   Special Requests   Final    BOTTLES DRAWN AEROBIC ONLY Blood Culture adequate volume   Culture   Final    NO GROWTH 4 DAYS Performed at [facility], [place] [address], [place], [place] [address]    Report Status PENDING  Incomplete  Respiratory Panel by RT PCR (Flu A&B, Covid) - Nasopharyngeal Swab     Status: None   Collection Time: 07/07/19  1:47 PM   Specimen: Nasopharyngeal Swab  Result Value Ref Range Status   SARS Coronavirus 2 by RT PCR NEGATIVE NEGATIVE Final    Comment: (NOTE) SARS-CoV-2 target nucleic acids are NOT DETECTED. The SARS-CoV-2 RNA is generally detectable in upper respiratoy specimens during the acute phase of infection. The lowest concentration of SARS-CoV-2 viral copies this assay can detect is 131 copies/mL. A negative result does not preclude SARS-Cov-2 infection and should not be used as the sole basis for treatment or other patient management decisions. A negative result may occur with  improper specimen collection/handling, submission of specimen other than nasopharyngeal swab, presence of viral mutation(s) within the areas targeted by this assay, and inadequate number of viral copies (<131 copies/mL). A negative result must be combined with clinical observations, patient history, and epidemiological information. The  expected result is Negative. Fact Sheet for Patients:  PinkCheek.be Fact Sheet for Healthcare Providers:  GravelBags.it This test is not yet ap proved or cleared by the [place] FDA and  has been authorized for detection and/or diagnosis of SARS-CoV-2 by FDA under an Emergency Use Authorization (EUA). This EUA will remain  in effect (meaning this test can be used) for the duration of the COVID-19 declaration under Section 564(b)(1) of the Act, 21 U.S.C. section 360bbb-3(b)(1), unless the authorization is terminated  or revoked sooner.    Influenza A by PCR NEGATIVE NEGATIVE Final   Influenza B by PCR NEGATIVE NEGATIVE Final    Comment: (NOTE) The Xpert Xpress SARS-CoV-2/FLU/RSV assay is intended as an aid in  the diagnosis of influenza from Nasopharyngeal swab specimens and  should not be used as a sole basis for treatment. Nasal washings and  aspirates are unacceptable for Xpert Xpress SARS-CoV-2/FLU/RSV  testing. Fact Sheet for Patients: PinkCheek.be Fact Sheet for Healthcare Providers: GravelBags.it This test is not yet approved or cleared by the [place] FDA and  has been authorized for detection and/or diagnosis of SARS-CoV-2 by  FDA under an Emergency Use Authorization (EUA). This EUA will remain  in effect (meaning this test can be used) for the duration of the  Covid-19 declaration under Section 564(b)(1) of the Act, 21  U.S.C. section 360bbb-3(b)(1), unless the authorization is  terminated or revoked. Performed at [facility], [place] [address] [place], [place] [address]   Respiratory Panel by PCR     Status: None   Collection Time: 07/07/19  1:47 PM   Specimen: Nasopharyngeal Swab; Respiratory  Result Value Ref Range Status   Adenovirus NOT DETECTED NOT DETECTED Final   Coronavirus 229E NOT DETECTED NOT DETECTED Final    Comment: (NOTE)  The Coronavirus on the Respiratory Panel, DOES NOT test for the novel  Coronavirus (2019 nCoV)    Coronavirus HKU1 NOT DETECTED NOT DETECTED Final   Coronavirus NL63 NOT DETECTED NOT DETECTED Final   Coronavirus OC43 NOT DETECTED NOT DETECTED Final   Metapneumovirus NOT DETECTED NOT DETECTED Final   Rhinovirus / Enterovirus NOT DETECTED NOT DETECTED Final   Influenza A NOT DETECTED NOT DETECTED Final   Influenza B NOT DETECTED NOT DETECTED Final   Parainfluenza Virus 1 NOT DETECTED NOT DETECTED Final   Parainfluenza Virus 2 NOT DETECTED NOT DETECTED Final   Parainfluenza Virus 3 NOT DETECTED NOT DETECTED Final   Parainfluenza Virus 4 NOT DETECTED NOT DETECTED Final   Respiratory Syncytial Virus NOT DETECTED NOT DETECTED Final   Bordetella pertussis NOT DETECTED NOT DETECTED Final   Chlamydophila pneumoniae NOT DETECTED NOT DETECTED Final   Mycoplasma pneumoniae NOT DETECTED NOT DETECTED Final    Comment: Performed at [facility], [place]. [address], [place], [place] [address]  Urine culture     Status: Abnormal   Collection Time: 07/07/19  2:13 PM   Specimen: Urine, Random  Result Value Ref Range Status   Specimen Description URINE, RANDOM  Final   Special Requests   Final    NONE Performed at [facility], [place] [address] [place], [place] [address]    Culture >=100,000 COLONIES/mL ESCHERICHIA COLI (A)  Final   Report Status 07/09/2019 FINAL  Final   Organism ID, Bacteria ESCHERICHIA COLI (A)  Final      Susceptibility   Escherichia coli - MIC*    AMPICILLIN 8 SENSITIVE Sensitive     CEFAZOLIN <=4 SENSITIVE Sensitive     CEFTRIAXONE <=1 SENSITIVE Sensitive     CIPROFLOXACIN <=0.25 SENSITIVE Sensitive     GENTAMICIN <=1 SENSITIVE Sensitive     IMIPENEM <=0.25 SENSITIVE Sensitive     NITROFURANTOIN <=16 SENSITIVE Sensitive     TRIMETH/SULFA <=20 SENSITIVE Sensitive     AMPICILLIN/SULBACTAM 4 SENSITIVE Sensitive     PIP/TAZO <=4 SENSITIVE Sensitive     * >=100,000  COLONIES/mL ESCHERICHIA COLI  Expectorated sputum assessment w rflx to resp cult  Status: None   Collection Time: 07/09/19  1:26 AM   Specimen: SPU  Result Value Ref Range Status   Specimen Description SPUTUM  Final   Special Requests NONE  Final   Sputum evaluation   Final    THIS SPECIMEN IS ACCEPTABLE FOR SPUTUM CULTURE Performed at [facility], [address], [place], [place] [address]    Report Status 07/09/2019 FINAL  Final  Culture, respiratory     Status: None   Collection Time: 07/09/19  1:26 AM   Specimen: SPU  Result Value Ref Range Status   Specimen Description SPUTUM  Final   Special Requests NONE Reflexed from [id number]  Final   Gram Stain   Final    MODERATE WBC PRESENT, PREDOMINANTLY PMN FEW GRAM POSITIVE COCCI IN CHAINS FEW GRAM NEGATIVE RODS    Culture   Final    Consistent with normal respiratory flora. Performed at [facility], [place] [address] [place], [place] [address]    Report Status 07/11/2019 FINAL  Final  MRSA PCR Screening     Status: None   Collection Time: 07/11/19  6:35 AM   Specimen: Nasal Mucosa; Nasopharyngeal  Result Value Ref Range Status   MRSA by PCR NEGATIVE NEGATIVE Final    Comment:        The GeneXpert MRSA Assay (FDA approved for NASAL specimens only), is one component of a comprehensive MRSA colonization surveillance program. It is not intended to diagnose MRSA infection nor to guide or monitor treatment for MRSA infections. Performed at [facility], [place] [address] [place], [place] [address]   Expectorated sputum assessment w rflx to resp cult     Status: None   Collection Time: 07/11/19  9:19 AM   Specimen: Sputum  Result Value Ref Range Status   Specimen Description SPUTUM  Final   Special Requests Immunocompromised  Final   Sputum evaluation   Final    THIS SPECIMEN IS ACCEPTABLE FOR SPUTUM CULTURE Performed at [facility], [place] [address], [place], [place] [address]    Report Status  07/11/2019 FINAL  Final  Culture, respiratory     Status: None (Preliminary result)   Collection Time: 07/11/19  9:19 AM   Specimen: SPU  Result Value Ref Range Status   Specimen Description SPUTUM  Final   Special Requests Immunocompromised Reflexed from [id number]  Final   Gram Stain   Final    RARE WBC PRESENT, PREDOMINANTLY PMN FEW GRAM POSITIVE COCCI IN CLUSTERS RARE YEAST Performed at [facility], [place] [address] [place],  [address]    Culture PENDING  Incomplete   Report Status PENDING  Incomplete      Studies: DG CHEST PORT 1 VIEW  Result Date: 07/10/2019 CLINICAL DATA:  Hypoxia. Shortness of breath with productive cough for 1 week. Recent hospital admission for sepsis. EXAM: PORTABLE CHEST 1 VIEW COMPARISON:  Radiographs 07/07/2019 and 06/07/2017. CT 04/02/2016. FINDINGS: 1645 hours. Stable mild cardiomegaly and aortic atherosclerosis. There is persistent interstitial prominence suspicious for mild edema. Compared with the recent prior study, the aeration of the retrocardiac left lower lobe has improved. There is no new airspace disease, pneumothorax or significant pleural effusion. The bones appear unremarkable. Telemetry leads overlie the chest. IMPRESSION: 1. Persistent interstitial prominence suspicious for mild edema. 2. Interval improved aeration of the left lower lobe without residual focal airspace disease. 3. Stable mild cardiomegaly and aortic atherosclerosis. Electronically Signed   By: Richardean Sale  M.D.   On: 07/10/2019 17:02    Scheduled Meds: . sodium chloride   Intravenous Once  . aspirin EC  81 mg Oral QHS  . atorvastatin  20 mg Oral Daily  . [START ON 07/12/2019] carvedilol  25 mg Oral BID WC  . Chlorhexidine Gluconate Cloth  6 each Topical Q0600  . Chlorhexidine Gluconate Cloth  6 each Topical Q0600  . Chlorhexidine Gluconate Cloth  6 each Topical Q0600  . cloNIDine  0.1 mg Oral BID  . darbepoetin (ARANESP) injection - DIALYSIS  60 mcg  Intravenous Q Sat-HD  . ferrous sulfate  325 mg Oral BID  . finasteride  5 mg Oral QPM  . [START ON 07/12/2019] hydrALAZINE  100 mg Oral TID  . insulin aspart  0-9 Units Subcutaneous TID WC  . [START ON 07/12/2019] isosorbide dinitrate  30 mg Oral TID  . multivitamin with minerals  1 tablet Oral Daily  . ranolazine  1,000 mg Oral BID  . tamsulosin  0.4 mg Oral QPC supper  . warfarin  7.5 mg Oral ONCE-1800  . Warfarin - Pharmacist Dosing Inpatient   Does not apply q1800    Continuous Infusions: . cefTRIAXone (ROCEPHIN)  IV 2 g (07/11/19 1512)  . heparin 1,400 Units/hr (07/11/19 1761)     LOS: 4 days     Kayleen Memos, MD [facility] Pager [phone]  If 7PM-7AM, please contact night-coverage [email or web address] Password [facility] 07/11/2019, 3:26 PM

## 2019-07-11 NOTE — Progress Notes (Signed)
CRITICAL VALUE ALERT  Critical Value:  Troponin 90  Date & Time Notied: 07/11/19 1150  Provider Notified: Dr. Aileen Fass  Orders Received/Actions taken: Await new orders as appropriate

## 2019-07-11 NOTE — Progress Notes (Signed)
Pharmacy Antibiotic Note  Ray Merritt is a 83 y.o. male admitted on 07/07/2019 with pneumonia and UTI.  Initially treated with rocephin and azithromycin but wbc remain elevated.  Pharmacy has been consulted for zosyn  dosing. ESRD, Tm 100.1 PCT 1.45, WBC 20  Plan: Stop azithromycin and rocephin Zosyn 3.375gm IV q12h EI   Height: 6\' 3"  (190.5 cm) Weight: 221 lb 12.5 oz (100.6 kg) IBW/kg (Calculated) : 84.5  Temp (24hrs), Avg:99.5 F (37.5 C), Min:98.2 F (36.8 C), Max:100.2 F (37.9 C)  Recent Labs  Lab 07/07/19 1057 07/07/19 1756 07/08/19 0336 07/09/19 0318 07/10/19 0301 07/11/19 0319 07/11/19 0729 07/11/19 1023  WBC 16.8*  --  19.0* 14.9* 16.5* 20.7*  --   --   CREATININE 6.61*  --  5.83* 5.94* 4.65* 5.38*  --   --   LATICACIDVEN 1.0 1.1  --   --   --   --  0.7 0.8    Estimated Creatinine Clearance: 12.9 mL/min (A) (by C-G formula based on SCr of 5.38 mg/dL (H)).    No Known Allergies  Antimicrobials this admission: azith 12/17>12/21 rocephin 12/17>12/21   Dose adjustments this admission:   Microbiology results:  Sputum 12/19: norm flora Bcx 12/17: ngtd Ucx 12/17: E coli, pan sens 12/17 FluA/B/Covid neg   Bonnita Nasuti Pharm.D. CPP, BCPS Clinical Pharmacist [phone] 07/11/2019 4:07 PM

## 2019-07-11 NOTE — Progress Notes (Signed)
Rehab Admissions Coordinator Note:  Patient was screened by Cleatrice Burke for appropriateness for an Inpatient Acute Rehab Consult per PT recs.   At this time, we are recommending Inpatient Rehab consult. I will place order.  Cleatrice Burke RN MSN 07/11/2019, 11:49 AM  I can be reached at [phone].

## 2019-07-11 NOTE — Progress Notes (Addendum)
ANTICOAGULATION CONSULT NOTE - Follow Up Consult  Pharmacy Consult for Heparin IV and Warfarin Indication: chest pain/ACS and atrial fibrillation  No Known Allergies  Patient Measurements: Height: 6\' 3"  (190.5 cm) Weight: 221 lb 12.5 oz (100.6 kg) IBW/kg (Calculated) : 84.5 Heparin Dosing Weight: 102.6 kg  Vital Signs: Temp: 99.5 F (37.5 C) (12/21 0615) Temp Source: Oral (12/21 0615) BP: 161/67 (12/21 0500) Pulse Rate: 71 (12/21 0530)  Labs: Recent Labs    07/09/19 0318 07/09/19 1501 07/10/19 0301 07/10/19 0836 07/11/19 0319  HGB 7.3*  --  7.8*  --  8.0*  HCT 21.7*  --  23.5*  --  24.5*  PLT 263  --  239  --  253  LABPROT  --   --   --  14.0 15.5*  INR  --   --   --  1.1 1.2  HEPARINUNFRC  --  0.59 0.56  --  0.39  CREATININE 5.94*  --  4.65*  --  5.38*    Estimated Creatinine Clearance: 12.9 mL/min (A) (by C-G formula based on SCr of 5.38 mg/dL (H)).   Medications:  Infusions:  . cefTRIAXone (ROCEPHIN)  IV 2 g (07/10/19 1257)  . ferric gluconate (FERRLECIT/NULECIT) IV 250 mg (07/10/19 0846)  . heparin 1,400 Units/hr (07/11/19 4462)    Assessment: 83 year old male presented with blood tinged sputum and bilateral LLE edema. EKG suggestive of ischemia. Heparin was stopped 12/18, but patient went into atrial fibrillation and heparin was restarted. No anticoagulation prior to admission. Pharmacy now asked to start warfarin.  Heparin level therapeutic at 0.39, on 1400 units/hr. Baseline INR 1.1 yesterday, received first dose of warfarin 7.5 mg on 12/21 - azithromycin to end after dose on 12/21. Hgb (8) low but improving. Plts WNL. No overt bleeding or infusion issues noted.  Goal of Therapy:  INR 2-3 Heparin level 0.3-0.7 units/ml Monitor platelets by anticoagulation protocol: Yes   Plan:  Order warfarin 7.5 mg PO x1 tonight Continue heparin IV at 1400 units/hr Daily INR, heparin level, and CBC Monitor for signs of bleeding  Antonietta Jewel, PharmD,  BCCCP Clinical Pharmacist  Phone: [phone]  Please check AMION for all [place] phone numbers After 10:00 PM, call [place] [phone] 07/11/2019  7:45 AM

## 2019-07-11 NOTE — Progress Notes (Signed)
[facility] NEPHROLOGY PROGRESS NOTE  Assessment/ Plan: Pt is a 83 y.o. yo male with history of CAD, CLL in remission, DM 2, CKD 5 follows with Dr. Posey Pronto, baseline creatinine level around 5.6, has a left UE AV fistula, admitted with a cough, fever and shortness of breath  #CKD stage V with fluid overload and uremia progressed to ESRD: Refractory to diuretics. Initiated HD on 12/19, tolerated well with UF of 1000 cc.   Plan for second HD today and third HD tomorrow. He has AV fistula for the access.  He will need arrangement for outpatient HD.  #Community acquired pneumonia: On antibiotics per primary team.    #Acute hypoxia: Chest x-ray with mild pulmonary edema.  UF during HD.  #Anemia of CKD: Iron saturation 15%.  Ordered IV iron 250 mg x 3 doses and start ESA.    #Metabolic acidosis: Discontinued sodium bicarbonate, starting HD.  #Hypertension: Continue current medication.  BP/volume expect to improve with dialysis.  #Acute on chronic CHF, non-STEMI: Currently on IV heparin.  Cardiology is following.  Subjective: Seen and examined at bedside.  Event noted require some oxygen for respiratory stress.  This morning feels weak.  Denies chest pain or shortness of breath.  Objective Vital signs in last 24 hours: Vitals:   07/11/19 0500 07/11/19 0530 07/11/19 0615 07/11/19 0833  BP: (!) 161/67   (!) 170/58  Pulse: 73 71  72  Resp: 14 19 19  (!) 21  Temp: 100.2 F (37.9 C)  99.5 F (37.5 C) 99.1 F (37.3 C)  TempSrc: Oral  Oral Oral  SpO2: 98% 97%  94%  Weight:  100.6 kg    Height:       Weight change: 1 kg  Intake/Output Summary (Last 24 hours) at 07/11/2019 1011 Last data filed at 07/11/2019 0530 Gross per 24 hour  Intake 471.99 ml  Output 900 ml  Net -428.01 ml       Labs: Basic Metabolic Panel: Recent Labs  Lab 07/09/19 0318 07/10/19 0301 07/11/19 0319  NA 135 137 134*  K 3.9 3.8 4.1  CL 104 104 101  CO2 17* 21* 17*  GLUCOSE 151* 184* 155*   BUN 103* 70* 82*  CREATININE 5.94* 4.65* 5.38*  CALCIUM 8.9 8.6* 8.9  PHOS 4.2 3.4 5.0*   Liver Function Tests: Recent Labs  Lab 07/07/19 1057 07/09/19 0318 07/10/19 0301 07/11/19 0319  AST 13*  --   --   --   ALT 14  --   --   --   ALKPHOS 61  --   --   --   BILITOT 0.6  --   --   --   PROT 6.4*  --   --   --   ALBUMIN 3.5 2.7* 2.6* 2.5*   No results for input(s): LIPASE, AMYLASE in the last 168 hours. No results for input(s): AMMONIA in the last 168 hours. CBC: Recent Labs  Lab 07/07/19 1057 07/08/19 0336 07/09/19 0318 07/10/19 0301 07/11/19 0319  WBC 16.8* 19.0* 14.9* 16.5* 20.7*  NEUTROABS 11.3*  --   --   --   --   HGB 8.5* 8.1* 7.3* 7.8* 8.0*  HCT 26.2* 24.9* 21.7* 23.5* 24.5*  MCV 99.2 96.9 94.8 95.5 98.4  PLT 248 277 263 239 253   Cardiac Enzymes: No results for input(s): CKTOTAL, CKMB, CKMBINDEX, TROPONINI in the last 168 hours. CBG: Recent Labs  Lab 07/10/19 0626 07/10/19 1129 07/10/19 1722 07/10/19 2158 07/11/19 0612  GLUCAP 207*  185* 205* 140* 167*    Iron Studies:  Recent Labs    07/09/19 0614  IRON 32*  TIBC 217*  FERRITIN 544*   Studies/Results: DG CHEST PORT 1 VIEW  Result Date: 07/10/2019 CLINICAL DATA:  Hypoxia. Shortness of breath with productive cough for 1 week. Recent hospital admission for sepsis. EXAM: PORTABLE CHEST 1 VIEW COMPARISON:  Radiographs 07/07/2019 and 06/07/2017. CT 04/02/2016. FINDINGS: 1645 hours. Stable mild cardiomegaly and aortic atherosclerosis. There is persistent interstitial prominence suspicious for mild edema. Compared with the recent prior study, the aeration of the retrocardiac left lower lobe has improved. There is no new airspace disease, pneumothorax or significant pleural effusion. The bones appear unremarkable. Telemetry leads overlie the chest. IMPRESSION: 1. Persistent interstitial prominence suspicious for mild edema. 2. Interval improved aeration of the left lower lobe without residual focal  airspace disease. 3. Stable mild cardiomegaly and aortic atherosclerosis. Electronically Signed   By: Richardean Sale M.D.   On: 07/10/2019 17:02    Medications: Infusions: . cefTRIAXone (ROCEPHIN)  IV 2 g (07/10/19 1257)  . ferric gluconate (FERRLECIT/NULECIT) IV 250 mg (07/11/19 0959)  . heparin 1,400 Units/hr (07/11/19 7903)    Scheduled Medications: . sodium chloride   Intravenous Once  . aspirin EC  81 mg Oral QHS  . atorvastatin  20 mg Oral Daily  . azithromycin  500 mg Oral Daily  . [START ON 07/12/2019] carvedilol  25 mg Oral BID WC  . Chlorhexidine Gluconate Cloth  6 each Topical Q0600  . Chlorhexidine Gluconate Cloth  6 each Topical Q0600  . cloNIDine  0.1 mg Oral BID  . darbepoetin (ARANESP) injection - DIALYSIS  60 mcg Intravenous Q Sat-HD  . ferrous sulfate  325 mg Oral BID  . finasteride  5 mg Oral QPM  . [START ON 07/12/2019] hydrALAZINE  100 mg Oral TID  . insulin aspart  0-9 Units Subcutaneous TID WC  . [START ON 07/12/2019] isosorbide dinitrate  30 mg Oral TID  . multivitamin with minerals  1 tablet Oral Daily  . ranolazine  1,000 mg Oral BID  . tamsulosin  0.4 mg Oral QPC supper  . warfarin  7.5 mg Oral ONCE-1800  . Warfarin - Pharmacist Dosing Inpatient   Does not apply q1800    have reviewed scheduled and prn medications.  Physical Exam: General: NAD, on 2 L of oxygen Heart:RRR, s1s2 nl Lungs: Bibasal crackles b/l, no wheezing Abdomen:soft, Non-tender, non-distended Extremities: Bilateral lower extremity pitting edema Dialysis Access: Left AV fistula has good thrill and bruit.  Nathali Vent Prasad Kathline Banbury 07/11/2019,10:11 AM  LOS: 4 days  Pager: [phone]

## 2019-07-11 NOTE — Progress Notes (Signed)
CRITICAL VALUE ALERT  Critical Value:  Troponin 199  Date & Time Notied:  07/11/19 9437  Provider Notified: Dr. Aileen Fass  Orders Received/Actions taken: Await new orders as appropriate

## 2019-07-11 NOTE — Evaluation (Signed)
Physical Therapy Evaluation Patient Details Name: Ray Merritt MRN: [id number] DOB: [birth year] Today's Date: 07/11/2019   History of Present Illness  83yo male c/o SOB with blood tinged sputum, nausea/vomiting. Covid negative x2, flu panel negative. Admitted with sepsis secondary to CAP, acute on chronic CHF. PMH DM, CAD, CKD, CA, hx shoulder surgery, cardac cath  Clinical Impression   Patient received in bed, very pleasant and willing to attempt therapy. Reports he is not on O2 at home, removed supplemental O2 and immediately dropped to 87%, unable to recover to 90s without support/replacement of 2LPM O2. Required MaxA with HOB elevated for bed mobility, once at EOB required Min guard-MinA to maintain sitting balance at EOB. Attempted sit to stand with RW and bed elevated, unable to clear buttocks and will require +2 to progress mobility. He was left in bed positioned to comfort with all needs met, bed alarm active. Currently recommending intensive therapies in the CIR setting moving forward to assist in return to independence.     Follow Up Recommendations CIR;Supervision/Assistance - 24 hour    Equipment Recommendations  Rolling walker with 5" wheels;3in1 (PT);Wheelchair (measurements PT);Wheelchair cushion (measurements PT);Other (comment)(hoyer lift)    Recommendations for Other Services       Precautions / Restrictions Precautions Precautions: Fall;Other (comment) Precaution Comments: watch vitals, B UE weakness Restrictions Weight Bearing Restrictions: No      Mobility  Bed Mobility Overal bed mobility: Needs Assistance Bed Mobility: Supine to Sit;Sit to Supine     Supine to sit: Max assist;HOB elevated Sit to supine: Max assist   General bed mobility comments: MaxA with Mod cues for sequencing, weakness in BUEs preventing him from UE pressdown to help scoot forward at EOB  Transfers Overall transfer level: Needs assistance   Transfers: Sit to/from Stand Sit to  Stand: Total assist;From elevated surface         General transfer comment: attempted from elevated bed and with totalA, unable and will require +2 assist  Ambulation/Gait             General Gait Details: unable  Stairs            Wheelchair Mobility    Modified Rankin (Stroke Patients Only)       Balance Overall balance assessment: Needs assistance Sitting-balance support: Bilateral upper extremity supported;Feet supported Sitting balance-Leahy Scale: Poor Sitting balance - Comments: Min guard-MinA to correct posterior lean at EOB Postural control: Posterior lean                                   Pertinent Vitals/Pain Pain Assessment: Faces Faces Pain Scale: Hurts little more Pain Location: generalized with movement Pain Descriptors / Indicators: Discomfort Pain Intervention(s): Limited activity within patient's tolerance;Monitored during session    Home Living Family/patient expects to be discharged to:: Private residence Living Arrangements: Spouse/significant other;Other (Comment)(daughter and grand-daughter also easily available) Available Help at Discharge: Family;Available 24 hours/day Type of Home: House Home Access: Stairs to enter   [employer] of Steps: 7 in front with B rails, 3 in the back with U rail Home Layout: One level Home Equipment: Grab bars - toilet;Grab bars - tub/shower      Prior Function Level of Independence: Independent               Hand Dominance        Extremity/Trunk Assessment   Upper Extremity Assessment Upper Extremity Assessment: Defer  to OT evaluation    Lower Extremity Assessment Lower Extremity Assessment: Generalized weakness    Cervical / Trunk Assessment Cervical / Trunk Assessment: Kyphotic  Communication   Communication: No difficulties  Cognition Arousal/Alertness: Awake/alert Behavior During Therapy: WFL for tasks assessed/performed Overall Cognitive Status:  Within Functional Limits for tasks assessed                                        General Comments General comments (skin integrity, edema, etc.): SPO2 drop to 87% on room air, unable to recover so replaced 2LPM O2 and increased back into 90s    Exercises     Assessment/Plan    PT Assessment Patient needs continued PT services  PT Problem List Decreased strength;Decreased activity tolerance;Decreased safety awareness;Decreased balance;Decreased mobility;Decreased coordination       PT Treatment Interventions DME instruction;Balance training;Gait training;Stair training;Functional mobility training;Patient/family education;Therapeutic activities;Therapeutic exercise    PT Goals (Current goals can be found in the Care Plan section)  Acute Rehab PT Goals Patient Stated Goal: get stronger, go to rehab PT Goal Formulation: With patient Time For Goal Achievement: 07/25/19 Potential to Achieve Goals: Good    Frequency Min 3X/week   Barriers to discharge        Co-evaluation               AM-PAC PT "6 Clicks" Mobility  Outcome Measure Help needed turning from your back to your side while in a flat bed without using bedrails?: A Lot Help needed moving from lying on your back to sitting on the side of a flat bed without using bedrails?: A Lot Help needed moving to and from a bed to a chair (including a wheelchair)?: Total Help needed standing up from a chair using your arms (e.g., wheelchair or bedside chair)?: Total Help needed to walk in hospital room?: Total Help needed climbing 3-5 steps with a railing? : Total 6 Click Score: 8    End of Session Equipment Utilized During Treatment: Gait belt Activity Tolerance: Patient limited by fatigue Patient left: in bed;with call bell/phone within reach;with bed alarm set Nurse Communication: Mobility status PT Visit Diagnosis: Unsteadiness on feet (R26.81);Muscle weakness (generalized) (M62.81);Difficulty in  walking, not elsewhere classified (R26.2)    Time: 8828-0034 PT Time Calculation (min) (ACUTE ONLY): 33 min   Charges:   PT Evaluation $PT Eval Moderate Complexity: 1 Mod PT Treatments $Therapeutic Activity: 8-22 mins        Windell Norfolk, DPT, PN1   Supplemental Physical Therapist [place]    Pager [phone] Acute Rehab Office [phone]

## 2019-07-12 ENCOUNTER — Encounter (HOSPITAL_COMMUNITY): Payer: Self-pay

## 2019-07-12 ENCOUNTER — Ambulatory Visit: Payer: Medicare Other | Admitting: Cardiology

## 2019-07-12 ENCOUNTER — Inpatient Hospital Stay (HOSPITAL_COMMUNITY): Payer: Medicare Other

## 2019-07-12 ENCOUNTER — Other Ambulatory Visit: Payer: Medicare Other

## 2019-07-12 ENCOUNTER — Inpatient Hospital Stay (HOSPITAL_COMMUNITY): Admission: RE | Admit: 2019-07-12 | Payer: Medicare Other | Source: Ambulatory Visit

## 2019-07-12 ENCOUNTER — Other Ambulatory Visit: Payer: Self-pay | Admitting: Cardiology

## 2019-07-12 DIAGNOSIS — N189 Chronic kidney disease, unspecified: Secondary | ICD-10-CM

## 2019-07-12 DIAGNOSIS — C9110 Chronic lymphocytic leukemia of B-cell type not having achieved remission: Secondary | ICD-10-CM

## 2019-07-12 LAB — GLUCOSE, CAPILLARY
Glucose-Capillary: 190 mg/dL — ABNORMAL HIGH (ref 70–99)
Glucose-Capillary: 198 mg/dL — ABNORMAL HIGH (ref 70–99)
Glucose-Capillary: 191 mg/dL — ABNORMAL HIGH (ref 70–99)
Glucose-Capillary: 190 mg/dL — ABNORMAL HIGH (ref 70–99)

## 2019-07-12 LAB — FOLATE RBC
Folate, Hemolysate: 467 ng/mL
Folate, RBC: 2004 ng/mL (ref >498)
Hematocrit: 23.3 % — ABNORMAL LOW (ref 37.5–51.0)

## 2019-07-12 LAB — CULTURE, BLOOD (ROUTINE X 2)
Culture: NO GROWTH
Culture: NO GROWTH
Special Requests: ADEQUATE
Special Requests: ADEQUATE

## 2019-07-12 LAB — CBC
HCT: 22.6 % — ABNORMAL LOW (ref 39.0–52.0)
Hemoglobin: 7.8 g/dL — ABNORMAL LOW (ref 13.0–17.0)
MCH: 32.1 pg (ref 26.0–34.0)
MCHC: 34.5 g/dL (ref 30.0–36.0)
MCV: 93 fL (ref 80.0–100.0)
Platelets: 270 10*3/uL (ref 150–400)
RBC: 2.43 MIL/uL — ABNORMAL LOW (ref 4.22–5.81)
RDW: 13.1 % (ref 11.5–15.5)
WBC: 24.2 10*3/uL — ABNORMAL HIGH (ref 4.0–10.5)
nRBC: 0.1 % (ref 0.0–0.2)

## 2019-07-12 LAB — BASIC METABOLIC PANEL
Anion gap: 15 (ref 5–15)
BUN: 72 mg/dL — ABNORMAL HIGH (ref 8–23)
CO2: 21 mmol/L — ABNORMAL LOW (ref 22–32)
Calcium: 8.9 mg/dL (ref 8.9–10.3)
Chloride: 96 mmol/L — ABNORMAL LOW (ref 98–111)
Creatinine, Ser: 5.4 mg/dL — ABNORMAL HIGH (ref 0.61–1.24)
GFR calc Af Amer: 11 mL/min — ABNORMAL LOW (ref >60)
GFR calc non Af Amer: 9 mL/min — ABNORMAL LOW (ref >60)
Glucose, Bld: 187 mg/dL — ABNORMAL HIGH (ref 70–99)
Potassium: 4.1 mmol/L (ref 3.5–5.1)
Sodium: 132 mmol/L — ABNORMAL LOW (ref 135–145)

## 2019-07-12 LAB — HEPARIN LEVEL (UNFRACTIONATED): Heparin Unfractionated: 0.49 IU/mL (ref 0.30–0.70)

## 2019-07-12 LAB — PROTIME-INR
INR: 1.5 — ABNORMAL HIGH (ref 0.8–1.2)
Prothrombin Time: 17.7 seconds — ABNORMAL HIGH (ref 11.4–15.2)

## 2019-07-12 LAB — TROPONIN I (HIGH SENSITIVITY): Troponin I (High Sensitivity): 280 ng/L (ref <18)

## 2019-07-12 MED ORDER — CARVEDILOL 25 MG PO TABS
25.0000 mg | ORAL_TABLET | Freq: Two times a day (BID) | ORAL | Status: DC
  Administered 2019-07-14 – 2019-07-26 (×19): 25 mg via ORAL
  Filled 2019-07-12 (×19): qty 1

## 2019-07-12 MED ORDER — IPRATROPIUM-ALBUTEROL 0.5-2.5 (3) MG/3ML IN SOLN
3.0000 mL | Freq: Three times a day (TID) | RESPIRATORY_TRACT | Status: DC
  Administered 2019-07-13 – 2019-07-15 (×6): 3 mL via RESPIRATORY_TRACT
  Filled 2019-07-12 (×6): qty 3

## 2019-07-12 MED ORDER — ISOSORBIDE DINITRATE 30 MG PO TABS
30.0000 mg | ORAL_TABLET | Freq: Three times a day (TID) | ORAL | Status: DC
  Administered 2019-07-14 – 2019-07-26 (×30): 30 mg via ORAL
  Filled 2019-07-12 (×39): qty 1

## 2019-07-12 MED ORDER — HYDRALAZINE HCL 50 MG PO TABS
100.0000 mg | ORAL_TABLET | Freq: Three times a day (TID) | ORAL | Status: DC
  Administered 2019-07-14 – 2019-07-26 (×31): 100 mg via ORAL
  Filled 2019-07-12 (×32): qty 2

## 2019-07-12 MED ORDER — FUROSEMIDE 10 MG/ML IJ SOLN
60.0000 mg | Freq: Once | INTRAMUSCULAR | Status: AC
  Administered 2019-07-12: 60 mg via INTRAVENOUS
  Filled 2019-07-12: qty 6

## 2019-07-12 MED ORDER — LORAZEPAM 2 MG/ML IJ SOLN
0.5000 mg | Freq: Once | INTRAMUSCULAR | Status: DC
  Filled 2019-07-12: qty 1

## 2019-07-12 MED ORDER — FUROSEMIDE 10 MG/ML IJ SOLN: 60.0000 mg | Freq: Once | INTRAMUSCULAR | Status: DC

## 2019-07-12 MED ORDER — WARFARIN SODIUM 5 MG PO TABS
5.0000 mg | ORAL_TABLET | Freq: Once | ORAL | Status: AC
  Administered 2019-07-12: 5 mg via ORAL
  Filled 2019-07-12: qty 1

## 2019-07-12 MED ORDER — IPRATROPIUM-ALBUTEROL 0.5-2.5 (3) MG/3ML IN SOLN
3.0000 mL | Freq: Four times a day (QID) | RESPIRATORY_TRACT | Status: DC
  Administered 2019-07-12 (×3): 3 mL via RESPIRATORY_TRACT
  Filled 2019-07-12 (×3): qty 3

## 2019-07-12 NOTE — Progress Notes (Signed)
Pt returned from HD without being dialyzed s/t swelling and redness over AV fistula.  Nephrologist aware.  Attending made aware.  Plan is for Lasix IV now and HD tomorrow.  Will continue to monitor.

## 2019-07-12 NOTE — Progress Notes (Addendum)
[facility] NEPHROLOGY PROGRESS NOTE  Assessment/ Plan: Pt is a 83 y.o. yo male with history of CAD, CLL in remission, DM 2, CKD 5 follows with Dr. Posey Pronto, baseline creatinine level around 5.6, has a left UE AV fistula, admitted with a cough, fever and shortness of breath  #CKD stage V with fluid overload and uremia progressed to ESRD: Refractory to diuretics. Initiated HD on 12/19, tolerating well.  Plan for third treatment today. He has AV fistula for the access.  SW is following for outpatient HD arrangement.  #Community acquired pneumonia: On antibiotics per primary team.    #Acute hypoxia: Chest x-ray with mild pulmonary edema.  UF during HD.  #Anemia of CKD: Iron saturation 15%.  Ordered IV iron 250 mg x 3 doses and started ESA.    #Metabolic acidosis: Discontinued sodium bicarbonate, on HD now.  #Hypertension: Continue current medication.  BP/volume expect to improve with dialysis.  #Acute on chronic CHF, non-STEMI: Currently on IV heparin.  Cardiology is following.  Addendum 3:32 PM: Patient was re-evaluated at bedside. He has LUE arm swollen and there is a blister.No erythema or sign of infection at this time. Apparently, there was infiltration yesterday during HD. On 12/19 no issue with access.  I will change the dialysis for tomorrow to rest AVF today.  Elevate arm, d/w nurse to place a below under UE. Continue to monitor.   Subjective: Seen and examined at bedside.  He has been tolerating dialysis well.  Second HD yesterday with a total UF of around 2000 cc.  Still volume up.  Plan for third treatment today.  Denies nausea vomiting or chest pain.  SOB and oxygen requirement improving.  Objective Vital signs in last 24 hours: Vitals:   07/12/19 0700 07/12/19 0734 07/12/19 0800 07/12/19 0820  BP:   (!) 145/60 (!) 145/60  Pulse:   74   Resp: 20 20 (!) 21   Temp:   99 F (37.2 C)   TempSrc:   Oral   SpO2:   100%   Weight:      Height:       Weight  change: -1.3 kg  Intake/Output Summary (Last 24 hours) at 07/12/2019 1045 Last data filed at 07/12/2019 0300 Gross per 24 hour  Intake 867.54 ml  Output 2720 ml  Net -1852.46 ml       Labs: Basic Metabolic Panel: Recent Labs  Lab 07/09/19 0318 07/10/19 0301 07/11/19 0319 07/12/19 0327  NA 135 137 134* 132*  K 3.9 3.8 4.1 4.1  CL 104 104 101 96*  CO2 17* 21* 17* 21*  GLUCOSE 151* 184* 155* 187*  BUN 103* 70* 82* 72*  CREATININE 5.94* 4.65* 5.38* 5.40*  CALCIUM 8.9 8.6* 8.9 8.9  PHOS 4.2 3.4 5.0*  --    Liver Function Tests: Recent Labs  Lab 07/07/19 1057 07/09/19 0318 07/10/19 0301 07/11/19 0319  AST 13*  --   --   --   ALT 14  --   --   --   ALKPHOS 61  --   --   --   BILITOT 0.6  --   --   --   PROT 6.4*  --   --   --   ALBUMIN 3.5 2.7* 2.6* 2.5*   No results for input(s): LIPASE, AMYLASE in the last 168 hours. No results for input(s): AMMONIA in the last 168 hours. CBC: Recent Labs  Lab 07/07/19 1057 07/08/19 0336 07/09/19 0318 07/10/19 0301 07/11/19 0319 07/12/19  0327  WBC 16.8* 19.0* 14.9* 16.5* 20.7* 24.2*  NEUTROABS 11.3*  --   --   --   --   --   HGB 8.5* 8.1* 7.3* 7.8* 8.0* 7.8*  HCT 26.2* 24.9* 21.7* 23.5* 24.5* 22.6*  MCV 99.2 96.9 94.8 95.5 98.4 93.0  PLT 248 277 263 239 253 270   Cardiac Enzymes: No results for input(s): CKTOTAL, CKMB, CKMBINDEX, TROPONINI in the last 168 hours. CBG: Recent Labs  Lab 07/11/19 0612 07/11/19 1144 07/11/19 1620 07/11/19 2115 07/12/19 0637  GLUCAP 167* 154* 169* 138* 190*    Iron Studies:  No results for input(s): IRON, TIBC, TRANSFERRIN, FERRITIN in the last 72 hours. Studies/Results: DG CHEST PORT 1 VIEW  Result Date: 07/12/2019 CLINICAL DATA:  Hypoxia and shortness of breath. EXAM: PORTABLE CHEST 1 VIEW COMPARISON:  07/10/2019 and earlier studies. FINDINGS: Masslike right perihilar opacity has developed since the prior chest radiograph. Lungs also demonstrate prominent bronchovascular  markings and mild interstitial thickening, unchanged from the prior study. No convincing pleural effusion.  No pneumothorax. Cardiac silhouette is normal in size. No mediastinal or left hilar masses. IMPRESSION: 1. Masslike right perihilar opacity developing since the prior exam. This rapid change is consistent with infection or inflammation. Suspect pneumonia. Electronically Signed   By: Lajean Manes M.D.   On: 07/12/2019 09:10   DG CHEST PORT 1 VIEW  Result Date: 07/10/2019 CLINICAL DATA:  Hypoxia. Shortness of breath with productive cough for 1 week. Recent hospital admission for sepsis. EXAM: PORTABLE CHEST 1 VIEW COMPARISON:  Radiographs 07/07/2019 and 06/07/2017. CT 04/02/2016. FINDINGS: 1645 hours. Stable mild cardiomegaly and aortic atherosclerosis. There is persistent interstitial prominence suspicious for mild edema. Compared with the recent prior study, the aeration of the retrocardiac left lower lobe has improved. There is no new airspace disease, pneumothorax or significant pleural effusion. The bones appear unremarkable. Telemetry leads overlie the chest. IMPRESSION: 1. Persistent interstitial prominence suspicious for mild edema. 2. Interval improved aeration of the left lower lobe without residual focal airspace disease. 3. Stable mild cardiomegaly and aortic atherosclerosis. Electronically Signed   By: Richardean Sale M.D.   On: 07/10/2019 17:02    Medications: Infusions: . heparin 1,400 Units/hr (07/12/19 0300)  . piperacillin-tazobactam (ZOSYN)  IV 3.375 g (07/12/19 0523)    Scheduled Medications: . sodium chloride   Intravenous Once  . aspirin EC  81 mg Oral QHS  . atorvastatin  20 mg Oral Daily  . carvedilol  25 mg Oral BID WC  . Chlorhexidine Gluconate Cloth  6 each Topical Q0600  . Chlorhexidine Gluconate Cloth  6 each Topical Q0600  . Chlorhexidine Gluconate Cloth  6 each Topical Q0600  . cloNIDine  0.1 mg Oral BID  . darbepoetin (ARANESP) injection - DIALYSIS  60 mcg  Intravenous Q Sat-HD  . ferrous sulfate  325 mg Oral BID  . finasteride  5 mg Oral QPM  . hydrALAZINE  100 mg Oral TID  . insulin aspart  0-9 Units Subcutaneous TID WC  . ipratropium-albuterol  3 mL Nebulization Q6H  . isosorbide dinitrate  30 mg Oral TID  . multivitamin with minerals  1 tablet Oral Daily  . ranolazine  1,000 mg Oral BID  . tamsulosin  0.4 mg Oral QPC supper  . warfarin  5 mg Oral ONCE-1800  . Warfarin - Pharmacist Dosing Inpatient   Does not apply q1800    have reviewed scheduled and prn medications.  Physical Exam: General:  on 2 L of oxygen, able  to lie flat, not in distress Heart:RRR, s1s2 nl Lungs: Bibasal crackles b/l, no wheezing Abdomen:soft, Non-tender, non-distended Extremities: Bilateral lower extremity pitting edema Dialysis Access: Left AV fistula has good thrill and bruit.  Aminah Zabawa Prasad Margues Filippini 07/12/2019,10:45 AM  LOS: 5 days  Pager: [phone]

## 2019-07-12 NOTE — Progress Notes (Signed)
Pt is getting increasingly confused.  Stated he wanted his daughter to come see him now and when nurses tried to explain that his daughter couldn't see him at this time because visiting hours were over, he didn't believe [place].  Pt called the police and said the hospital "is holding him hostage." Contacted pt's daughter and had her talk to the pt to calm him and explain why she couldn't come see him late at night.  The daughter called the nurse and said her dad was not acting like himself.  The pt's daughter would like a physician to call her and explain what could be causing a change in his mental status.  Will continue to monitor.  Lupita Dawn, RN

## 2019-07-12 NOTE — Progress Notes (Signed)
Pt's AVF swollen from previous cannulation. As Band-Aids were removed from site 2 skin tears occurred. Clean and dry dressing applied to skin tear sites.    Access very warm to touch, pt afebrile.Dr Carolin Sicks notified. Per Dr. Carolin Sicks  Pt to rest AVF and give HD tomorrow and continue  AVF infiltration protocol.

## 2019-07-12 NOTE — Care Management Important Message (Signed)
Important Message  Patient Details  Name: Ray Merritt MRN: [id number] Date of Birth: [birth year]   Medicare Important Message Given:  Yes     Shelda Altes 07/12/2019, 1:40 PM

## 2019-07-12 NOTE — Progress Notes (Signed)
Inpatient Rehab Admissions Coordinator:   Spoke with pt's daughter, Bunnie Pion, over the phone.  At this time, she prefers for pt to come home and receive home health f/u. She already provides care for her mother and feels it will be easier to have pt come home when medically ready.  Will ask therapy to update CM with DME recommendations for home, and sign off at this time.  If family should change their mind, we are happy to consider this patient.    Shann Medal, PT, DPT Admissions Coordinator [phone] 07/12/19  11:50 AM

## 2019-07-12 NOTE — Discharge Planning (Addendum)
Patient has a seat at [facility] MWF @ 12:35. Clinic is closed on Christmas Day.Holiday  Schedule this week will be MWSaturday. Spoke with patient's daughter Melonie. Provided the schedule. She will transport patient.She was also advised about the holiday schedule.  Pam Mccollum  [phone]

## 2019-07-12 NOTE — Progress Notes (Signed)
PT Cancellation Note  Patient Details Name: Ray Merritt MRN: [id number] DOB: May 11, 1936   Cancelled Treatment:    Reason Eval/Treat Not Completed: Medical issues which prohibited therapy. Pt with issues with AV fistula. MD into assess. Pt was told to rest today due to issues in dialysis. Will follow up another date as time allows. Acute PT to continue.   Willow Ora, PTA, CLT Acute Rehab Services [phone] 07/12/19, 4:17 PM   Willow Ora 07/12/2019, 4:15 PM

## 2019-07-12 NOTE — Progress Notes (Signed)
ANTICOAGULATION CONSULT NOTE - Follow Up Consult  Pharmacy Consult for Heparin IV and Warfarin Indication: chest pain/ACS and atrial fibrillation  No Known Allergies  Patient Measurements: Height: 6\' 3"  (190.5 cm) Weight: 223 lb 15.8 oz (101.6 kg) IBW/kg (Calculated) : 84.5 Heparin Dosing Weight: 102.6 kg  Vital Signs: Temp: 98.1 F (36.7 C) (12/22 0438) Temp Source: Oral (12/22 0438) BP: 173/60 (12/22 0438) Pulse Rate: 75 (12/22 0438)  Labs: Recent Labs    07/10/19 0301 07/10/19 0301 07/10/19 0836 07/11/19 0319 07/11/19 1544 07/11/19 1802 07/12/19 0327  HGB 7.8*  --   --  8.0*  --   --  7.8*  HCT 23.5*  --   --  24.5*  --   --  22.6*  PLT 239  --   --  253  --   --  270  LABPROT  --   --  14.0 15.5*  --   --  17.7*  INR  --   --  1.1 1.2  --   --  1.5*  HEPARINUNFRC 0.56  --   --  0.39  --   --  0.49  CREATININE 4.65*  --   --  5.38*  --   --  5.40*  TROPONINIHS  --    < >  --   --  199* 229* 280*   < > = values in this interval not displayed.    Estimated Creatinine Clearance: 13.9 mL/min (A) (by C-G formula based on SCr of 5.4 mg/dL (H)).   Medications:  Infusions:  . heparin 1,400 Units/hr (07/12/19 0300)  . piperacillin-tazobactam (ZOSYN)  IV 3.375 g (07/12/19 0523)    Assessment: 83 year old male presented with blood tinged sputum and bilateral LLE edema. EKG suggestive of ischemia. Heparin was stopped 12/18, but patient went into atrial fibrillation and heparin was restarted. No anticoagulation prior to admission. Pharmacy now asked to start warfarin.  Heparin level therapeutic at 0.49, on 1400 units/hr. Baseline INR 1.1 on 12/20, received first dose of warfarin 7.5 mg on 12/21 - azithromycin to end after dose on 12/21. INR today increased to 1.5 after 2 doses.  Hgb (7.8) low but stable. Plts WNL. No overt bleeding or infusion issues noted.  Goal of Therapy:  INR 2-3 Heparin level 0.3-0.7 units/ml Monitor platelets by anticoagulation protocol: Yes    Plan:  Order warfarin 5 mg PO x1 tonight Continue heparin IV at 1400 units/hr Daily INR, heparin level, and CBC Monitor for signs of bleeding  Antonietta Jewel, PharmD, BCCCP Clinical Pharmacist  Phone: [phone]  Please check AMION for all [place] phone numbers After 10:00 PM, call [place] [phone] 07/12/2019  8:11 AM

## 2019-07-12 NOTE — Progress Notes (Addendum)
PROGRESS NOTE  Ray Merritt CLE:[id number] DOB: 1935/10/01 DOA: 07/07/2019 PCP: [name], MD  HPI/Recap of past 56 hours: 83 year old gentleman with history of hypertension, chronic kidney disease stage IV nearing hemodialysis, coronary artery disease, recent abnormal stress test, type 2 diabetes and CLL presented to the emergency room with shortness of breath, cough with blood-tinged sputum since morning.  He had some atypical chest pain.  Recently with weight gain and extremity edema.  He does have left AV arm AV fistula that has not been used yet. In the emergency room, febrile with temperature 100.6, blood pressures elevated, 2 L nasal cannula placed for comfort.  WBC 16.8.  Creatinine 6.61.  EKG revealed ST depression in inferior lateral leads.  Mildly elevated troponins.  Chest x-ray with fluid in the fissures, suspected atelectasis versus pneumonia. COVID-19 point-of-care and PCR test negative.  Admitted for IV antibiotics and heparin infusion with cardiology and nephrology consult.  Acute blood loss anemia requiring 1 unit irradiated RBC 12/19.  Progressing to ESRD first HD on 07/09/2019, then 07/11/19.  Nephrology has outpatient HD spot.  07/12/19: Seen and examined.  Weak appearing and wheezing on exam.  Unable to have hemodialysis today, LUE swollen with a blister plan to rest AV fistula.  Diuresed with IV Lasix 60 mg once.  Also treated with Bronchodilators and breathing treatments at bedside by bedside RN Delsa Sale with improvement of symptomatology.    Assessment/Plan: Principal Problem:   Sepsis ([place]) Active Problems:   CLL (chronic lymphocytic leukemia) (HCC)   Type 2 diabetes, HbA1c goal < 7% (HCC)   Acute renal failure superimposed on chronic kidney disease (HCC)   Acute on chronic combined systolic and diastolic CHF (congestive heart failure) (HCC)   Hypertensive urgency   Urinary tract infection   Abnormal EKG   Elevated troponin  Sepsis secondary to  Enterococcous Faecalis community-acquired pneumonia versus E. coli UTI Urine culture grew greater than 100,000 colonies of E. coli, pansensitive. Sputum culture grew abundant enterococous faecalis Worsening leukocytosis with elevated procalcitonin, was switched to Zosyn on 12/21 from Rocephin and azithromycin. Added Unasyn to cover enterococcous Repeated x-ray on 12/22, personally reviewed which showed bilateral pulmonary infiltrates worse at bases and a masslike right perihilar opacity developing since the prior exam, this rapid change is consistent with infection or inflammation.  Suspect pneumonia. Monitor fever curve and WBC Repeat procalcitonin level in the morning Blood cx x2 NTD.  Follow cultures.  New ESRD on HD First HD on 07/09/2019 Next HD completed on 07/11/2019  HD canceled on 07/12/2019 due to swelling and blister AV fistula.  Plan for hemodialysis tomorrow 07/13/2019. Patient has an outpatient HD spot arranged by nephrology.  Worsening hypervolemic hyponatremia Sodium 134>>132 Volume managed with hemodialysis Plan hemodialysis tomorrow.  Volume overload in the setting of new ESRD Management Per nephrology  Elevated troponin likely demand ischemia in the setting of hypoxia Troponin peaked at 280 Denies anginal symptoms at the time of this exam.  NSTEMI Followed by cardiology On aspirin and Lipitor Continue cardiac medications as recommended by cardiology.  Paroxysmal A. fib CHA2DS2-VASc score 6 Anticoagulate with Coumadin per cardiology Continue warfarin, managed by pharmacy Goal INR between 2 and 3 INR subtherapeutic 1.5  Subtherapeutic INR Management per above Daily INR  Anemia of chronic disease complicated by iron deficiency Received 1 unit of irradiated RBC on 07/09/2019  hemoglobin stable 7.8. No sign of overt bleeding Nephrology managing with ESA and iron repletion.  BPH Stable Continue home medications Continue to monitor urine  output  Acute on chronic combined systolic and diastolic CHF Continue strict I's and O's and daily weight Continue cardiac medications Volume managed by hemodialysis and IV Lasix as needed for volume overload Received 60 mg IV Lasix 1 today  Essential hypertension Likely will need to reduce doses of antihypertensives Continue to closely monitor vital signs Continue clonidine at lower dose 0.1 mg twice daily Hold off Coreg and hydralazine to give room for diuresing as needed before hemodialysis  CLL Will need to follow-up  Physical debility PT OT to assess Continue PT OT with assistance and fall precautions.  DVT prophylaxis:  Coumadin bridged with Heparin infusion Code Status: Full code Family Communication: None  Disposition Plan: Pending clinical improvement, nephrology and cardiology signing off.   Consultants:   Cardiology, Dr. Einar Gip  Nephrology  Procedures:   None  Antimicrobials:   Rocephin, azithromycin, 07/07/2019--- 07/11/19  Zosyn 07/11/2019---         Objective: Vitals:   07/12/19 1537 07/12/19 1635 07/12/19 1724 07/12/19 1821  BP: (!) 170/62 (!) 151/93 (!) 158/53 (!) 134/53  Pulse: 77 78 74   Resp: 20 (!) 21    Temp: 98.4 F (36.9 C)     TempSrc: Oral     SpO2: 94% 97% 99%   Weight:      Height:        Intake/Output Summary (Last 24 hours) at 07/12/2019 1930 Last data filed at 07/12/2019 0300 Gross per 24 hour  Intake 552.56 ml  Output 2720 ml  Net -2167.44 ml   Filed Weights   07/11/19 0530 07/11/19 1839 07/12/19 0438  Weight: 100.6 kg 99.3 kg 101.6 kg    Exam:  . General: 83 y.o. year-old male cardiopulmonary.  Uncomfortable due to wheezing.  Alert and oriented x3.   . Cardiovascular: Regular rate and rhythm no rubs or gallops.   Marland Kitchen Respiratory: Diffuse wheezing bilaterally. . Abdomen: Soft nontender normal bowel sounds present. Musculoskeletal: Lower extremity edema bilaterally.   Psychiatry: Mood is appropriate  for condition and setting.  Data Reviewed: CBC: Recent Labs  Lab 07/07/19 1057 07/08/19 0336 07/09/19 0318 07/09/19 0614 07/10/19 0301 07/11/19 0319 07/12/19 0327  WBC 16.8* 19.0* 14.9*  --  16.5* 20.7* 24.2*  NEUTROABS 11.3*  --   --   --   --   --   --   HGB 8.5* 8.1* 7.3*  --  7.8* 8.0* 7.8*  HCT 26.2* 24.9* 21.7* 23.3* 23.5* 24.5* 22.6*  MCV 99.2 96.9 94.8  --  95.5 98.4 93.0  PLT 248 277 263  --  239 253 998   Basic Metabolic Panel: Recent Labs  Lab 07/08/19 0336 07/09/19 0318 07/10/19 0301 07/11/19 0319 07/12/19 0327  NA 136 135 137 134* 132*  K 4.3 3.9 3.8 4.1 4.1  CL 107 104 104 101 96*  CO2 16* 17* 21* 17* 21*  GLUCOSE 131* 151* 184* 155* 187*  BUN 95* 103* 70* 82* 72*  CREATININE 5.83* 5.94* 4.65* 5.38* 5.40*  CALCIUM 9.2 8.9 8.6* 8.9 8.9  PHOS 4.2 4.2 3.4 5.0*  --    GFR: Estimated Creatinine Clearance: 13.9 mL/min (A) (by C-G formula based on SCr of 5.4 mg/dL (H)). Liver Function Tests: Recent Labs  Lab 07/07/19 1057 07/08/19 0336 07/09/19 0318 07/10/19 0301 07/11/19 0319  AST 13*  --   --   --   --   ALT 14  --   --   --   --   ALKPHOS 61  --   --   --   --  BILITOT 0.6  --   --   --   --   PROT 6.4*  --   --   --   --   ALBUMIN 3.5 3.1* 2.7* 2.6* 2.5*   No results for input(s): LIPASE, AMYLASE in the last 168 hours. No results for input(s): AMMONIA in the last 168 hours. Coagulation Profile: Recent Labs  Lab 07/10/19 0836 07/11/19 0319 07/12/19 0327  INR 1.1 1.2 1.5*   Cardiac Enzymes: No results for input(s): CKTOTAL, CKMB, CKMBINDEX, TROPONINI in the last 168 hours. BNP (last 3 results) No results for input(s): PROBNP in the last 8760 hours. HbA1C: No results for input(s): HGBA1C in the last 72 hours. CBG: Recent Labs  Lab 07/11/19 1620 07/11/19 2115 07/12/19 0637 07/12/19 1131 07/12/19 1547  GLUCAP 169* 138* 190* 198* 191*   Lipid Profile: No results for input(s): CHOL, HDL, LDLCALC, TRIG, CHOLHDL, LDLDIRECT in the  last 72 hours. Thyroid Function Tests: No results for input(s): TSH, T4TOTAL, FREET4, T3FREE, THYROIDAB in the last 72 hours. Anemia Panel: No results for input(s): VITAMINB12, FOLATE, FERRITIN, TIBC, IRON, RETICCTPCT in the last 72 hours. Urine analysis:    Component Value Date/Time   COLORURINE YELLOW 07/07/2019 1409   APPEARANCEUR HAZY (A) 07/07/2019 1409   LABSPEC 1.014 07/07/2019 1409   PHURINE 5.0 07/07/2019 1409   GLUCOSEU NEGATIVE 07/07/2019 1409   HGBUR NEGATIVE 07/07/2019 1409   BILIRUBINUR NEGATIVE 07/07/2019 1409   KETONESUR NEGATIVE 07/07/2019 1409   PROTEINUR 100 (A) 07/07/2019 1409   UROBILINOGEN 0.2 10/07/2011 1203   NITRITE NEGATIVE 07/07/2019 1409   LEUKOCYTESUR SMALL (A) 07/07/2019 1409   Sepsis Labs: @LABRCNTIP (procalcitonin:4,lacticidven:4)  ) Recent Results (from the past 240 hour(s))  Blood Culture (routine x 2)     Status: None   Collection Time: 07/07/19 10:57 AM   Specimen: BLOOD RIGHT ARM  Result Value Ref Range Status   Specimen Description BLOOD RIGHT ARM  Final   Special Requests   Final    BOTTLES DRAWN AEROBIC AND ANAEROBIC Blood Culture adequate volume   Culture   Final    NO GROWTH 5 DAYS Performed at [facility], [place] [address], [place], [place] [address]    Report Status 07/12/2019 FINAL  Final  Blood Culture (routine x 2)     Status: None   Collection Time: 07/07/19 11:49 AM   Specimen: BLOOD RIGHT FOREARM  Result Value Ref Range Status   Specimen Description BLOOD RIGHT FOREARM  Final   Special Requests   Final    BOTTLES DRAWN AEROBIC ONLY Blood Culture adequate volume   Culture   Final    NO GROWTH 5 DAYS Performed at [facility], [place] [address] [place], [place] [address]    Report Status 07/12/2019 FINAL  Final  Respiratory Panel by RT PCR (Flu A&B, Covid) - Nasopharyngeal Swab     Status: None   Collection Time: 07/07/19  1:47 PM   Specimen: Nasopharyngeal Swab  Result Value Ref Range Status   SARS  Coronavirus 2 by RT PCR NEGATIVE NEGATIVE Final    Comment: (NOTE) SARS-CoV-2 target nucleic acids are NOT DETECTED. The SARS-CoV-2 RNA is generally detectable in upper respiratoy specimens during the acute phase of infection. The lowest concentration of SARS-CoV-2 viral copies this assay can detect is 131 copies/mL. A negative result does not preclude SARS-Cov-2 infection and should not be used as the sole basis for treatment or other patient management decisions. A negative result may occur with  improper specimen collection/handling, submission of specimen other than nasopharyngeal swab, presence of viral mutation(s) within the areas targeted by this assay, and inadequate number of viral copies (<131 copies/mL). A negative result must be combined with clinical observations, patient history, and epidemiological information. The expected result is Negative. Fact Sheet for Patients:  PinkCheek.be Fact Sheet for Healthcare Providers:  GravelBags.it This test is not yet ap proved or cleared by the [place] FDA and  has been authorized for detection and/or diagnosis of SARS-CoV-2 by FDA under an Emergency Use Authorization (EUA). This EUA will remain  in effect (meaning this test can be used) for the duration of the COVID-19 declaration under Section 564(b)(1) of the Act, 21 U.S.C. section 360bbb-3(b)(1), unless the authorization is terminated or revoked sooner.    Influenza A by PCR NEGATIVE NEGATIVE Final   Influenza B by PCR NEGATIVE NEGATIVE Final    Comment: (NOTE) The Xpert Xpress SARS-CoV-2/FLU/RSV assay is intended as an aid in  the diagnosis of influenza from Nasopharyngeal swab specimens and  should not be used as a sole basis for treatment. Nasal washings and  aspirates are unacceptable for Xpert Xpress SARS-CoV-2/FLU/RSV  testing. Fact Sheet for Patients: PinkCheek.be Fact Sheet  for Healthcare Providers: GravelBags.it This test is not yet approved or cleared by the [place] FDA and  has been authorized for detection and/or diagnosis of SARS-CoV-2 by  FDA under an Emergency Use Authorization (EUA). This EUA will remain  in effect (meaning this test can be used) for the duration of the  Covid-19 declaration under Section 564(b)(1) of the Act, 21  U.S.C. section 360bbb-3(b)(1), unless the authorization is  terminated or revoked. Performed at [facility], [place] [address] [place], [place] [address]   Respiratory Panel by PCR     Status: None   Collection Time: 07/07/19  1:47 PM   Specimen: Nasopharyngeal Swab; Respiratory  Result Value Ref Range Status   Adenovirus NOT DETECTED NOT DETECTED Final   Coronavirus 229E NOT DETECTED NOT DETECTED Final    Comment: (NOTE) The Coronavirus on the Respiratory Panel, DOES NOT test for the novel  Coronavirus (2019 nCoV)    Coronavirus HKU1 NOT DETECTED NOT DETECTED Final   Coronavirus NL63 NOT DETECTED NOT DETECTED Final   Coronavirus OC43 NOT DETECTED NOT DETECTED Final   Metapneumovirus NOT DETECTED NOT DETECTED Final   Rhinovirus / Enterovirus NOT DETECTED NOT DETECTED Final   Influenza A NOT DETECTED NOT DETECTED Final   Influenza B NOT DETECTED NOT DETECTED Final   Parainfluenza Virus 1 NOT DETECTED NOT DETECTED Final   Parainfluenza Virus 2 NOT DETECTED NOT DETECTED Final   Parainfluenza Virus 3 NOT DETECTED NOT DETECTED Final   Parainfluenza Virus 4 NOT DETECTED NOT DETECTED Final   Respiratory Syncytial Virus NOT DETECTED NOT DETECTED Final   Bordetella pertussis NOT DETECTED NOT DETECTED Final   Chlamydophila pneumoniae NOT DETECTED NOT DETECTED Final   Mycoplasma pneumoniae NOT DETECTED NOT DETECTED Final    Comment: Performed at [facility], [place]. [address], [place], [place] [address]  Urine culture     Status: Abnormal   Collection Time: 07/07/19  2:13 PM    Specimen: Urine, Random  Result Value Ref Range Status   Specimen Description URINE, RANDOM  Final   Special Requests   Final    NONE Performed at [facility], [place] [address], [place], [place] [address]    Culture >=100,000 COLONIES/mL ESCHERICHIA COLI (A)  Final  Report Status 07/09/2019 FINAL  Final   Organism ID, Bacteria ESCHERICHIA COLI (A)  Final      Susceptibility   Escherichia coli - MIC*    AMPICILLIN 8 SENSITIVE Sensitive     CEFAZOLIN <=4 SENSITIVE Sensitive     CEFTRIAXONE <=1 SENSITIVE Sensitive     CIPROFLOXACIN <=0.25 SENSITIVE Sensitive     GENTAMICIN <=1 SENSITIVE Sensitive     IMIPENEM <=0.25 SENSITIVE Sensitive     NITROFURANTOIN <=16 SENSITIVE Sensitive     TRIMETH/SULFA <=20 SENSITIVE Sensitive     AMPICILLIN/SULBACTAM 4 SENSITIVE Sensitive     PIP/TAZO <=4 SENSITIVE Sensitive     * >=100,000 COLONIES/mL ESCHERICHIA COLI  Expectorated sputum assessment w rflx to resp cult     Status: None   Collection Time: 07/09/19  1:26 AM   Specimen: SPU  Result Value Ref Range Status   Specimen Description SPUTUM  Final   Special Requests NONE  Final   Sputum evaluation   Final    THIS SPECIMEN IS ACCEPTABLE FOR SPUTUM CULTURE Performed at [facility], [address] [place], [place] [address]    Report Status 07/09/2019 FINAL  Final  Culture, respiratory     Status: None   Collection Time: 07/09/19  1:26 AM   Specimen: SPU  Result Value Ref Range Status   Specimen Description SPUTUM  Final   Special Requests NONE Reflexed from [id number]  Final   Gram Stain   Final    MODERATE WBC PRESENT, PREDOMINANTLY PMN FEW GRAM POSITIVE COCCI IN CHAINS FEW GRAM NEGATIVE RODS    Culture   Final    Consistent with normal respiratory flora. Performed at [facility], [place] [address] [place], [place] [address]    Report Status 07/11/2019 FINAL  Final  MRSA PCR Screening     Status: None   Collection Time: 07/11/19  6:35 AM   Specimen: Nasal Mucosa;  Nasopharyngeal  Result Value Ref Range Status   MRSA by PCR NEGATIVE NEGATIVE Final    Comment:        The GeneXpert MRSA Assay (FDA approved for NASAL specimens only), is one component of a comprehensive MRSA colonization surveillance program. It is not intended to diagnose MRSA infection nor to guide or monitor treatment for MRSA infections. Performed at [facility], [place] [address], [place], [place] [address]   Culture, blood (routine x 2)     Status: None (Preliminary result)   Collection Time: 07/11/19  7:30 AM   Specimen: BLOOD RIGHT HAND  Result Value Ref Range Status   Specimen Description BLOOD RIGHT HAND  Final   Special Requests   Final    BOTTLES DRAWN AEROBIC ONLY Blood Culture results may not be optimal due to an inadequate volume of blood received in culture bottles   Culture   Final    NO GROWTH 1 DAY Performed at [facility], [place] [address] [place], [place] [address]    Report Status PENDING  Incomplete  Culture, blood (routine x 2)     Status: None (Preliminary result)   Collection Time: 07/11/19  7:35 AM   Specimen: BLOOD RIGHT HAND  Result Value Ref Range Status   Specimen Description BLOOD RIGHT HAND  Final   Special Requests   Final    BOTTLES DRAWN AEROBIC ONLY Blood Culture results may not be optimal due to an inadequate volume of blood received in culture bottles   Culture   Final  NO GROWTH 1 DAY Performed at [facility], [place] [address] [place], [place] [address]    Report Status PENDING  Incomplete  Expectorated sputum assessment w rflx to resp cult     Status: None   Collection Time: 07/11/19  9:19 AM   Specimen: Sputum  Result Value Ref Range Status   Specimen Description SPUTUM  Final   Special Requests Immunocompromised  Final   Sputum evaluation   Final    THIS SPECIMEN IS ACCEPTABLE FOR SPUTUM CULTURE Performed at [facility], [place] [address], [place], [place] [address]    Report Status 07/11/2019 FINAL   Final  Culture, respiratory     Status: None (Preliminary result)   Collection Time: 07/11/19  9:19 AM   Specimen: SPU  Result Value Ref Range Status   Specimen Description SPUTUM  Final   Special Requests Immunocompromised Reflexed from [id number]  Final   Gram Stain   Final    RARE WBC PRESENT, PREDOMINANTLY PMN FEW GRAM POSITIVE COCCI IN CLUSTERS RARE YEAST Performed at [facility], [place] [address] [place], [place] [address]    Culture ABUNDANT ENTEROCOCCUS FAECALIS  Final   Report Status PENDING  Incomplete      Studies: DG CHEST PORT 1 VIEW  Result Date: 07/12/2019 CLINICAL DATA:  Hypoxia and shortness of breath. EXAM: PORTABLE CHEST 1 VIEW COMPARISON:  07/10/2019 and earlier studies. FINDINGS: Masslike right perihilar opacity has developed since the prior chest radiograph. Lungs also demonstrate prominent bronchovascular markings and mild interstitial thickening, unchanged from the prior study. No convincing pleural effusion.  No pneumothorax. Cardiac silhouette is normal in size. No mediastinal or left hilar masses. IMPRESSION: 1. Masslike right perihilar opacity developing since the prior exam. This rapid change is consistent with infection or inflammation. Suspect pneumonia. Electronically Signed   By: Lajean Manes M.D.   On: 07/12/2019 09:10    Scheduled Meds: . sodium chloride   Intravenous Once  . aspirin EC  81 mg Oral QHS  . atorvastatin  20 mg Oral Daily  . [START ON 07/14/2019] carvedilol  25 mg Oral BID WC  . Chlorhexidine Gluconate Cloth  6 each Topical Q0600  . Chlorhexidine Gluconate Cloth  6 each Topical Q0600  . Chlorhexidine Gluconate Cloth  6 each Topical Q0600  . cloNIDine  0.1 mg Oral BID  . darbepoetin (ARANESP) injection - DIALYSIS  60 mcg Intravenous Q Sat-HD  . finasteride  5 mg Oral QPM  . [START ON 07/14/2019] hydrALAZINE  100 mg Oral TID  . insulin aspart  0-9 Units Subcutaneous TID WC  . ipratropium-albuterol  3 mL Nebulization Q6H  .  [START ON 07/14/2019] isosorbide dinitrate  30 mg Oral TID  . multivitamin with minerals  1 tablet Oral Daily  . ranolazine  1,000 mg Oral BID  . tamsulosin  0.4 mg Oral QPC supper  . Warfarin - Pharmacist Dosing Inpatient   Does not apply q1800    Continuous Infusions: . heparin 1,400 Units/hr (07/12/19 1543)  . piperacillin-tazobactam (ZOSYN)  IV 3.375 g (07/12/19 1703)     LOS: 5 days     Kayleen Memos, MD [facility] Pager [phone]  If 7PM-7AM, please contact night-coverage [email or web address] Password [email or web address] 07/12/2019, 7:30 PM

## 2019-07-12 NOTE — Progress Notes (Signed)
Pt's venous access infiltrated; icepack applied and cannulated above the infiltration. Needs an expert cannulater; blood flushed back in the needle but when saline flushed it infiltrated. Reported the infiltration to Emmit Pomfret and advised to keep icing the inflitrated arm on and off thorought the night. I used 17G on the venous access.

## 2019-07-13 ENCOUNTER — Telehealth: Payer: Self-pay

## 2019-07-13 DIAGNOSIS — A419 Sepsis, unspecified organism: Secondary | ICD-10-CM

## 2019-07-13 DIAGNOSIS — N179 Acute kidney failure, unspecified: Secondary | ICD-10-CM

## 2019-07-13 DIAGNOSIS — I480 Paroxysmal atrial fibrillation: Secondary | ICD-10-CM

## 2019-07-13 DIAGNOSIS — N184 Chronic kidney disease, stage 4 (severe): Secondary | ICD-10-CM

## 2019-07-13 DIAGNOSIS — I25110 Atherosclerotic heart disease of native coronary artery with unstable angina pectoris: Secondary | ICD-10-CM

## 2019-07-13 LAB — CBC
HCT: 21.8 % — ABNORMAL LOW (ref 39.0–52.0)
Hemoglobin: 7.2 g/dL — ABNORMAL LOW (ref 13.0–17.0)
MCH: 31.9 pg (ref 26.0–34.0)
MCHC: 33 g/dL (ref 30.0–36.0)
MCV: 96.5 fL (ref 80.0–100.0)
Platelets: 261 10*3/uL (ref 150–400)
RBC: 2.26 MIL/uL — ABNORMAL LOW (ref 4.22–5.81)
RDW: 13.3 % (ref 11.5–15.5)
WBC: 21.4 10*3/uL — ABNORMAL HIGH (ref 4.0–10.5)
nRBC: 0.1 % (ref 0.0–0.2)

## 2019-07-13 LAB — CULTURE, RESPIRATORY W GRAM STAIN

## 2019-07-13 LAB — BASIC METABOLIC PANEL
Anion gap: 17 — ABNORMAL HIGH (ref 5–15)
BUN: 105 mg/dL — ABNORMAL HIGH (ref 8–23)
CO2: 18 mmol/L — ABNORMAL LOW (ref 22–32)
Calcium: 8.7 mg/dL — ABNORMAL LOW (ref 8.9–10.3)
Chloride: 96 mmol/L — ABNORMAL LOW (ref 98–111)
Creatinine, Ser: 6.46 mg/dL — ABNORMAL HIGH (ref 0.61–1.24)
GFR calc Af Amer: 9 mL/min — ABNORMAL LOW (ref >60)
GFR calc non Af Amer: 7 mL/min — ABNORMAL LOW (ref >60)
Glucose, Bld: 189 mg/dL — ABNORMAL HIGH (ref 70–99)
Potassium: 4.3 mmol/L (ref 3.5–5.1)
Sodium: 131 mmol/L — ABNORMAL LOW (ref 135–145)

## 2019-07-13 LAB — PROCALCITONIN: Procalcitonin: 4.65 ng/mL

## 2019-07-13 LAB — GLUCOSE, CAPILLARY
Glucose-Capillary: 182 mg/dL — ABNORMAL HIGH (ref 70–99)
Glucose-Capillary: 171 mg/dL — ABNORMAL HIGH (ref 70–99)
Glucose-Capillary: 133 mg/dL — ABNORMAL HIGH (ref 70–99)
Glucose-Capillary: 161 mg/dL — ABNORMAL HIGH (ref 70–99)

## 2019-07-13 LAB — PROTIME-INR
INR: 1.8 — ABNORMAL HIGH (ref 0.8–1.2)
Prothrombin Time: 20.6 seconds — ABNORMAL HIGH (ref 11.4–15.2)

## 2019-07-13 LAB — HEPARIN LEVEL (UNFRACTIONATED): Heparin Unfractionated: 0.27 IU/mL — ABNORMAL LOW (ref 0.30–0.70)

## 2019-07-13 MED ORDER — WARFARIN SODIUM 5 MG PO TABS: 5.0000 mg | ORAL_TABLET | Freq: Once | ORAL | Status: DC

## 2019-07-13 MED ORDER — SODIUM CHLORIDE 0.9 % IV SOLN
3.0000 g | Freq: Every day | INTRAVENOUS | Status: DC
  Administered 2019-07-13 – 2019-07-20 (×8): 3 g via INTRAVENOUS
  Filled 2019-07-13: qty 3
  Filled 2019-07-13: qty 8
  Filled 2019-07-13 (×6): qty 3

## 2019-07-13 MED ORDER — RENA-VITE PO TABS
1.0000 | ORAL_TABLET | Freq: Every day | ORAL | Status: DC
  Administered 2019-07-13 – 2019-07-26 (×13): 1 via ORAL
  Filled 2019-07-13 (×13): qty 1

## 2019-07-13 MED ORDER — DARBEPOETIN ALFA 150 MCG/0.3ML IJ SOSY
150.0000 ug | PREFILLED_SYRINGE | INTRAMUSCULAR | Status: DC
  Administered 2019-07-23: 150 ug via INTRAVENOUS
  Filled 2019-07-13 (×2): qty 0.3

## 2019-07-13 NOTE — Telephone Encounter (Signed)
Pt's daughter called ans asked if you give her a call regarding her fathers care.

## 2019-07-13 NOTE — Progress Notes (Signed)
Paged on call hospitalist about pt's increased confusion and agitation. Physician ordered a one time IV Ativan 5 mg.  Will continue to monitor.  Lupita Dawn, RN

## 2019-07-13 NOTE — Progress Notes (Signed)
Was told just now that his access was unable to be used again today.  I think we will ned to place Ray Merritt.  I have put in an order for IR- is on coumadin but INR is only 1.8   Daughter Ray Merritt was informed as she is going to need to consent   Ray Merritt

## 2019-07-13 NOTE — Progress Notes (Signed)
PROGRESS NOTE  Ray Merritt YPP:[id number] DOB: [birth year] DOA: 07/07/2019 PCP: [name], MD  HPI/Recap of past 64 hours: 83 year old gentleman with history of hypertension, chronic kidney disease stage IV nearing hemodialysis, coronary artery disease, recent abnormal stress test, type 2 diabetes and CLL presented to the emergency room with shortness of breath, cough with blood-tinged sputum since morning.  He had some atypical chest pain.  Recently with weight gain and extremity edema.  He does have left AV arm AV fistula that has not been used yet. In the emergency room, febrile with temperature 100.6, blood pressures elevated, 2 L nasal cannula placed for comfort.  WBC 16.8.  Creatinine 6.61.  EKG revealed ST depression in inferior lateral leads.  Mildly elevated troponins.  Chest x-ray with fluid in the fissures, suspected atelectasis versus pneumonia. COVID-19 point-of-care and PCR test negative.  Admitted for IV antibiotics and heparin infusion with cardiology and nephrology consult.  Acute blood loss anemia requiring 1 unit irradiated RBC 12/19.  Progressing to ESRD first HD on 07/09/2019, then 07/11/19.  Nephrology has outpatient HD spot.  Subjective: No acute issues or events overnight,   Assessment/Plan: Principal Problem:   Sepsis ([place]) Active Problems:   CLL (chronic lymphocytic leukemia) (HCC)   Type 2 diabetes, HbA1c goal < 7% (HCC)   Acute renal failure superimposed on chronic kidney disease (HCC)   Acute on chronic combined systolic and diastolic CHF (congestive heart failure) (HCC)   Hypertensive urgency   Urinary tract infection   Abnormal EKG   Elevated troponin  Sepsis secondary to Enterococcous Faecalis community-acquired pneumonia, POA Concurrent E. coli UTI, likely POA Urine culture grew greater than 100,000 colonies of E. coli, pansensitive. Sputum culture grew abundant enterococous faecalis Azithromycin/ceftriaxone discontinued, transition to  Unasyn on 07/12/2019 Chest x-ray personally reviewed -right middle lobe opacification, not noted on x-ray 07/10/2019 Without formal fever since 17th WBC 16>>21>>24>>21 Blood cultures continue to be preliminarily negative  New ESRD on HD First HD on 07/09/2019 Next HD completed on 07/11/2019  HD canceled on 07/12/2019 due to swelling and blister AV fistula.  Plan for hemodialysis tomorrow 07/13/2019. Patient has an outpatient HD spot arranged by nephrology.  Worsening hypervolemic hyponatremia Sodium 134>>132>>131 Volume managed with hemodialysis Plan hemodialysis tomorrow.  Volume overload in the setting of new ESRD Management Per nephrology  Elevated troponin likely demand ischemia in the setting of hypoxia Troponin peaked at 280 Denies anginal symptoms at the time of this exam.  NSTEMI Followed by cardiology On aspirin and Lipitor Continue cardiac medications as recommended by cardiology.  Paroxysmal A. fib CHA2DS2-VASc score 6 Anticoagulate with Coumadin per cardiology Continue warfarin, managed by pharmacy Goal INR between 2 and 3 INR subtherapeutic 1.5  Subtherapeutic INR Management per above Daily INR Lab Results  Component Value Date   INR 1.8 (H) 07/13/2019   INR 1.5 (H) 07/12/2019   INR 1.2 07/11/2019    Anemia of chronic disease complicated by iron deficiency Received 1 unit of irradiated RBC on 07/09/2019 Hemoglobin minimally downtrending 7.2 No sign of overt bleeding Nephrology managing with ESA and iron repletion.  BPH Stable Continue home medications Continue to monitor urine output  Acute on chronic combined systolic and diastolic CHF Continue strict I's and O's and daily weight Continue cardiac medications Volume managed by hemodialysis and IV Lasix as needed for volume overload Received 60 mg IV Lasix 1 today  Essential hypertension Likely will need to reduce doses of antihypertensives Continue to closely monitor vital signs Continue  clonidine  at lower dose 0.1 mg twice daily Hold off Coreg and hydralazine to give room for diuresing as needed before hemodialysis  CLL Will need to follow-up  Physical debility PT OT to assess Continue PT OT with assistance and fall precautions.  DVT prophylaxis:  Coumadin Code Status: Full code Family Communication: None  Disposition Plan: Pending clinical improvement, nephrology and cardiology signing off.   Consultants:   Cardiology, Dr. Einar Gip  Nephrology  Procedures:   None  Antimicrobials:   Rocephin, azithromycin, 07/07/2019--- 07/11/19  Unasyn 07/11/2019---  Objective: Vitals:   07/12/19 2351 07/13/19 0500 07/13/19 0547 07/13/19 0811  BP: (!) 148/49  (!) 156/50   Pulse: 72  71 70  Resp: 19  18   Temp:   98.4 F (36.9 C)   TempSrc: Oral  Oral   SpO2: 99%  100% 99%  Weight:  101.1 kg    Height:        Intake/Output Summary (Last 24 hours) at 07/13/2019 0816 Last data filed at 07/13/2019 0400 Gross per 24 hour  Intake 532.03 ml  Output 375 ml  Net 157.03 ml   Filed Weights   07/11/19 1839 07/12/19 0438 07/13/19 0500  Weight: 99.3 kg 101.6 kg 101.1 kg    Exam:  . General: 83 y.o. year-old male cardiopulmonary.  Uncomfortable due to wheezing.  Alert and oriented x3.   . Cardiovascular: Regular rate and rhythm no rubs or gallops.   Marland Kitchen Respiratory: Diffuse wheezing bilaterally. . Abdomen: Soft nontender normal bowel sounds present. Musculoskeletal: Lower extremity edema bilaterally.   Psychiatry: Mood is appropriate for condition and setting.  Data Reviewed: CBC: Recent Labs  Lab 07/07/19 1057 07/09/19 0318 07/09/19 0614 07/10/19 0301 07/11/19 0319 07/12/19 0327 07/13/19 0316  WBC 16.8* 14.9*  --  16.5* 20.7* 24.2* 21.4*  NEUTROABS 11.3*  --   --   --   --   --   --   HGB 8.5* 7.3*  --  7.8* 8.0* 7.8* 7.2*  HCT 26.2* 21.7* 23.3* 23.5* 24.5* 22.6* 21.8*  MCV 99.2 94.8  --  95.5 98.4 93.0 96.5  PLT 248 263  --  239 253 270 676    Basic Metabolic Panel: Recent Labs  Lab 07/08/19 0336 07/09/19 0318 07/10/19 0301 07/11/19 0319 07/12/19 0327 07/13/19 0316  NA 136 135 137 134* 132* 131*  K 4.3 3.9 3.8 4.1 4.1 4.3  CL 107 104 104 101 96* 96*  CO2 16* 17* 21* 17* 21* 18*  GLUCOSE 131* 151* 184* 155* 187* 189*  BUN 95* 103* 70* 82* 72* 105*  CREATININE 5.83* 5.94* 4.65* 5.38* 5.40* 6.46*  CALCIUM 9.2 8.9 8.6* 8.9 8.9 8.7*  PHOS 4.2 4.2 3.4 5.0*  --   --    GFR: Estimated Creatinine Clearance: 10.7 mL/min (A) (by C-G formula based on SCr of 6.46 mg/dL (H)). Liver Function Tests: Recent Labs  Lab 07/07/19 1057 07/08/19 0336 07/09/19 0318 07/10/19 0301 07/11/19 0319  AST 13*  --   --   --   --   ALT 14  --   --   --   --   ALKPHOS 61  --   --   --   --   BILITOT 0.6  --   --   --   --   PROT 6.4*  --   --   --   --   ALBUMIN 3.5 3.1* 2.7* 2.6* 2.5*   No results for input(s): LIPASE, AMYLASE in the last 168 hours.  No results for input(s): AMMONIA in the last 168 hours. Coagulation Profile: Recent Labs  Lab 07/10/19 0836 07/11/19 0319 07/12/19 0327 07/13/19 0316  INR 1.1 1.2 1.5* 1.8*   Cardiac Enzymes: No results for input(s): CKTOTAL, CKMB, CKMBINDEX, TROPONINI in the last 168 hours. BNP (last 3 results) No results for input(s): PROBNP in the last 8760 hours. HbA1C: No results for input(s): HGBA1C in the last 72 hours. CBG: Recent Labs  Lab 07/12/19 0637 07/12/19 1131 07/12/19 1547 07/12/19 2131 07/13/19 0632  GLUCAP 190* 198* 191* 190* 182*   Lipid Profile: No results for input(s): CHOL, HDL, LDLCALC, TRIG, CHOLHDL, LDLDIRECT in the last 72 hours. Thyroid Function Tests: No results for input(s): TSH, T4TOTAL, FREET4, T3FREE, THYROIDAB in the last 72 hours. Anemia Panel: No results for input(s): VITAMINB12, FOLATE, FERRITIN, TIBC, IRON, RETICCTPCT in the last 72 hours. Urine analysis:    Component Value Date/Time   COLORURINE YELLOW 07/07/2019 1409   APPEARANCEUR HAZY (A)  07/07/2019 1409   LABSPEC 1.014 07/07/2019 1409   PHURINE 5.0 07/07/2019 1409   GLUCOSEU NEGATIVE 07/07/2019 1409   HGBUR NEGATIVE 07/07/2019 1409   BILIRUBINUR NEGATIVE 07/07/2019 1409   KETONESUR NEGATIVE 07/07/2019 1409   PROTEINUR 100 (A) 07/07/2019 1409   UROBILINOGEN 0.2 10/07/2011 1203   NITRITE NEGATIVE 07/07/2019 1409   LEUKOCYTESUR SMALL (A) 07/07/2019 1409    Recent Results (from the past 240 hour(s))  Blood Culture (routine x 2)     Status: None   Collection Time: 07/07/19 10:57 AM   Specimen: BLOOD RIGHT ARM  Result Value Ref Range Status   Specimen Description BLOOD RIGHT ARM  Final   Special Requests   Final    BOTTLES DRAWN AEROBIC AND ANAEROBIC Blood Culture adequate volume   Culture   Final    NO GROWTH 5 DAYS Performed at [facility], [place] [address], [place], [place] [address]    Report Status 07/12/2019 FINAL  Final  Blood Culture (routine x 2)     Status: None   Collection Time: 07/07/19 11:49 AM   Specimen: BLOOD RIGHT FOREARM  Result Value Ref Range Status   Specimen Description BLOOD RIGHT FOREARM  Final   Special Requests   Final    BOTTLES DRAWN AEROBIC ONLY Blood Culture adequate volume   Culture   Final    NO GROWTH 5 DAYS Performed at [facility], [place] [address], [place], [place] [address]    Report Status 07/12/2019 FINAL  Final  Respiratory Panel by RT PCR (Flu A&B, Covid) - Nasopharyngeal Swab     Status: None   Collection Time: 07/07/19  1:47 PM   Specimen: Nasopharyngeal Swab  Result Value Ref Range Status   SARS Coronavirus 2 by RT PCR NEGATIVE NEGATIVE Final    Comment: (NOTE) SARS-CoV-2 target nucleic acids are NOT DETECTED. The SARS-CoV-2 RNA is generally detectable in upper respiratoy specimens during the acute phase of infection. The lowest concentration of SARS-CoV-2 viral copies this assay can detect is 131 copies/mL. A negative result does not preclude SARS-Cov-2 infection and should not be used as the sole  basis for treatment or other patient management decisions. A negative result may occur with  improper specimen collection/handling, submission of specimen other than nasopharyngeal swab, presence of viral mutation(s) within the areas targeted by this assay, and inadequate number of viral copies (<131 copies/mL). A negative result must be combined with clinical observations, patient history, and epidemiological information. The expected result is Negative. Fact Sheet  for Patients:  PinkCheek.be Fact Sheet for Healthcare Providers:  GravelBags.it This test is not yet ap proved or cleared by the [place] FDA and  has been authorized for detection and/or diagnosis of SARS-CoV-2 by FDA under an Emergency Use Authorization (EUA). This EUA will remain  in effect (meaning this test can be used) for the duration of the COVID-19 declaration under Section 564(b)(1) of the Act, 21 U.S.C. section 360bbb-3(b)(1), unless the authorization is terminated or revoked sooner.    Influenza A by PCR NEGATIVE NEGATIVE Final   Influenza B by PCR NEGATIVE NEGATIVE Final    Comment: (NOTE) The Xpert Xpress SARS-CoV-2/FLU/RSV assay is intended as an aid in  the diagnosis of influenza from Nasopharyngeal swab specimens and  should not be used as a sole basis for treatment. Nasal washings and  aspirates are unacceptable for Xpert Xpress SARS-CoV-2/FLU/RSV  testing. Fact Sheet for Patients: PinkCheek.be Fact Sheet for Healthcare Providers: GravelBags.it This test is not yet approved or cleared by the [place] FDA and  has been authorized for detection and/or diagnosis of SARS-CoV-2 by  FDA under an Emergency Use Authorization (EUA). This EUA will remain  in effect (meaning this test can be used) for the duration of the  Covid-19 declaration under Section 564(b)(1) of the Act, 21  U.S.C.  section 360bbb-3(b)(1), unless the authorization is  terminated or revoked. Performed at [facility], [place] [address], [place], [place] [address]   Respiratory Panel by PCR     Status: None   Collection Time: 07/07/19  1:47 PM   Specimen: Nasopharyngeal Swab; Respiratory  Result Value Ref Range Status   Adenovirus NOT DETECTED NOT DETECTED Final   Coronavirus 229E NOT DETECTED NOT DETECTED Final    Comment: (NOTE) The Coronavirus on the Respiratory Panel, DOES NOT test for the novel  Coronavirus (2019 nCoV)    Coronavirus HKU1 NOT DETECTED NOT DETECTED Final   Coronavirus NL63 NOT DETECTED NOT DETECTED Final   Coronavirus OC43 NOT DETECTED NOT DETECTED Final   Metapneumovirus NOT DETECTED NOT DETECTED Final   Rhinovirus / Enterovirus NOT DETECTED NOT DETECTED Final   Influenza A NOT DETECTED NOT DETECTED Final   Influenza B NOT DETECTED NOT DETECTED Final   Parainfluenza Virus 1 NOT DETECTED NOT DETECTED Final   Parainfluenza Virus 2 NOT DETECTED NOT DETECTED Final   Parainfluenza Virus 3 NOT DETECTED NOT DETECTED Final   Parainfluenza Virus 4 NOT DETECTED NOT DETECTED Final   Respiratory Syncytial Virus NOT DETECTED NOT DETECTED Final   Bordetella pertussis NOT DETECTED NOT DETECTED Final   Chlamydophila pneumoniae NOT DETECTED NOT DETECTED Final   Mycoplasma pneumoniae NOT DETECTED NOT DETECTED Final    Comment: Performed at [facility], [place]. [address], [place], [place] [address]  Urine culture     Status: Abnormal   Collection Time: 07/07/19  2:13 PM   Specimen: Urine, Random  Result Value Ref Range Status   Specimen Description URINE, RANDOM  Final   Special Requests   Final    NONE Performed at [facility], [place] [address] [place], [place] [address]    Culture >=100,000 COLONIES/mL ESCHERICHIA COLI (A)  Final   Report Status 07/09/2019 FINAL  Final   Organism ID, Bacteria ESCHERICHIA COLI (A)  Final      Susceptibility   Escherichia coli - MIC*     AMPICILLIN 8 SENSITIVE Sensitive     CEFAZOLIN <=4 SENSITIVE Sensitive     CEFTRIAXONE <=1  SENSITIVE Sensitive     CIPROFLOXACIN <=0.25 SENSITIVE Sensitive     GENTAMICIN <=1 SENSITIVE Sensitive     IMIPENEM <=0.25 SENSITIVE Sensitive     NITROFURANTOIN <=16 SENSITIVE Sensitive     TRIMETH/SULFA <=20 SENSITIVE Sensitive     AMPICILLIN/SULBACTAM 4 SENSITIVE Sensitive     PIP/TAZO <=4 SENSITIVE Sensitive     * >=100,000 COLONIES/mL ESCHERICHIA COLI  Expectorated sputum assessment w rflx to resp cult     Status: None   Collection Time: 07/09/19  1:26 AM   Specimen: SPU  Result Value Ref Range Status   Specimen Description SPUTUM  Final   Special Requests NONE  Final   Sputum evaluation   Final    THIS SPECIMEN IS ACCEPTABLE FOR SPUTUM CULTURE Performed at [facility], [place] [address] [place], [place] [address]    Report Status 07/09/2019 FINAL  Final  Culture, respiratory     Status: None   Collection Time: 07/09/19  1:26 AM   Specimen: SPU  Result Value Ref Range Status   Specimen Description SPUTUM  Final   Special Requests NONE Reflexed from [id number]  Final   Gram Stain   Final    MODERATE WBC PRESENT, PREDOMINANTLY PMN FEW GRAM POSITIVE COCCI IN CHAINS FEW GRAM NEGATIVE RODS    Culture   Final    Consistent with normal respiratory flora. Performed at [facility], [place] [address] [place], [place] [address]    Report Status 07/11/2019 FINAL  Final  MRSA PCR Screening     Status: None   Collection Time: 07/11/19  6:35 AM   Specimen: Nasal Mucosa; Nasopharyngeal  Result Value Ref Range Status   MRSA by PCR NEGATIVE NEGATIVE Final    Comment:        The GeneXpert MRSA Assay (FDA approved for NASAL specimens only), is one component of a comprehensive MRSA colonization surveillance program. It is not intended to diagnose MRSA infection nor to guide or monitor treatment for MRSA infections. Performed at [facility], [place] [address] [place], [place]  [address]   Culture, blood (routine x 2)     Status: None (Preliminary result)   Collection Time: 07/11/19  7:30 AM   Specimen: BLOOD RIGHT HAND  Result Value Ref Range Status   Specimen Description BLOOD RIGHT HAND  Final   Special Requests   Final    BOTTLES DRAWN AEROBIC ONLY Blood Culture results may not be optimal due to an inadequate volume of blood received in culture bottles   Culture   Final    NO GROWTH 1 DAY Performed at [facility], [place] [address], [place], [place] [address]    Report Status PENDING  Incomplete  Culture, blood (routine x 2)     Status: None (Preliminary result)   Collection Time: 07/11/19  7:35 AM   Specimen: BLOOD RIGHT HAND  Result Value Ref Range Status   Specimen Description BLOOD RIGHT HAND  Final   Special Requests   Final    BOTTLES DRAWN AEROBIC ONLY Blood Culture results may not be optimal due to an inadequate volume of blood received in culture bottles   Culture   Final    NO GROWTH 1 DAY Performed at [facility], [place] [address] [place], [place] [address]    Report Status PENDING  Incomplete  Expectorated sputum assessment w rflx to resp cult     Status: None   Collection Time: 07/11/19  9:19  AM   Specimen: Sputum  Result Value Ref Range Status   Specimen Description SPUTUM  Final   Special Requests Immunocompromised  Final   Sputum evaluation   Final    THIS SPECIMEN IS ACCEPTABLE FOR SPUTUM CULTURE Performed at [facility], [address] [place], [place] [address]    Report Status 07/11/2019 FINAL  Final  Culture, respiratory     Status: None (Preliminary result)   Collection Time: 07/11/19  9:19 AM   Specimen: SPU  Result Value Ref Range Status   Specimen Description SPUTUM  Final   Special Requests Immunocompromised Reflexed from [id number]  Final   Gram Stain   Final    RARE WBC PRESENT, PREDOMINANTLY PMN FEW GRAM POSITIVE COCCI IN CLUSTERS RARE YEAST Performed at [facility], [place] [address],  [place], [place] [address]    Culture ABUNDANT ENTEROCOCCUS FAECALIS  Final   Report Status PENDING  Incomplete      Studies: DG CHEST PORT 1 VIEW  Result Date: 07/12/2019 CLINICAL DATA:  Hypoxia and shortness of breath. EXAM: PORTABLE CHEST 1 VIEW COMPARISON:  07/10/2019 and earlier studies. FINDINGS: Masslike right perihilar opacity has developed since the prior chest radiograph. Lungs also demonstrate prominent bronchovascular markings and mild interstitial thickening, unchanged from the prior study. No convincing pleural effusion.  No pneumothorax. Cardiac silhouette is normal in size. No mediastinal or left hilar masses. IMPRESSION: 1. Masslike right perihilar opacity developing since the prior exam. This rapid change is consistent with infection or inflammation. Suspect pneumonia. Electronically Signed   By: Lajean Manes M.D.   On: 07/12/2019 09:10    Scheduled Meds: . sodium chloride   Intravenous Once  . aspirin EC  81 mg Oral QHS  . atorvastatin  20 mg Oral Daily  . [START ON 07/14/2019] carvedilol  25 mg Oral BID WC  . Chlorhexidine Gluconate Cloth  6 each Topical Q0600  . Chlorhexidine Gluconate Cloth  6 each Topical Q0600  . Chlorhexidine Gluconate Cloth  6 each Topical Q0600  . cloNIDine  0.1 mg Oral BID  . darbepoetin (ARANESP) injection - DIALYSIS  60 mcg Intravenous Q Sat-HD  . finasteride  5 mg Oral QPM  . [START ON 07/14/2019] hydrALAZINE  100 mg Oral TID  . insulin aspart  0-9 Units Subcutaneous TID WC  . ipratropium-albuterol  3 mL Nebulization TID  . [START ON 07/14/2019] isosorbide dinitrate  30 mg Oral TID  . LORazepam  0.5 mg Intravenous Once  . multivitamin with minerals  1 tablet Oral Daily  . ranolazine  1,000 mg Oral BID  . tamsulosin  0.4 mg Oral QPC supper  . Warfarin - Pharmacist Dosing Inpatient   Does not apply q1800    Continuous Infusions: . ampicillin-sulbactam (UNASYN) IV 3 g (07/13/19 0705)  . heparin 1,400 Units/hr (07/13/19 0400)     LOS: 6  days     Little Ishikawa, DO [facility] Pager [phone]  If 7PM-7AM, please contact night-coverage [email or web address] Password [email or web address] 07/13/2019, 8:16 AM

## 2019-07-13 NOTE — Progress Notes (Signed)
[facility] NEPHROLOGY PROGRESS NOTE  Assessment/ Plan: Pt is a 83 y.o. yo male with history of CAD, CLL in remission, DM 2, CKD 5 follows with Dr. Posey Pronto, baseline creatinine level around 5.6, has a left AV fistula, admitted with a cough, fever and shortness of breath  #CKD stage V with fluid overload and uremia progressed to ESRD: Refractory to diuretics. Initiated HD on 12/19, second on 12/21. Had infiltration of AVF on Mon, rested AVF yest.   Planning for third treatment today.   Set up for [facility] on MWF second shift.  Earliest he could likely start there would be Monday so if dialysis were the only thing keeping him here would need to stay until Sat AM-  Get HD and then could be discharged   #Community acquired pneumonia: On antibiotics per primary team. unasyn   #Acute hypoxia: Chest x-ray with mild pulmonary edema.  UF during HD as well.  #Anemia of CKD: Iron saturation 15%.  Ordered IV ferrlecit 250 mg x 3 doses and started ESA- will inc dose for hgb in the 7's.    #Metabolic acidosis: Discontinued sodium bicarbonate, on HD now.  #Hypertension: Continue current medication.  BP/volume expect to improve with dialysis.  On hydral 100, isordil, clonidine and coreg-  Likely will be able to wean BP meds, not yet   #Acute on chronic CHF, non-STEMI: Currently on IV heparin.  Cardiology is following.  dispo-  Needs more PT/OT- plan is to return home. Could be for discharge on Sat AM for dialysis purposes    Subjective: Some confusion overnight reported -  Seems in and out this AM - blister on arm is stable-  Does not appear infected    Objective Vital signs in last 24 hours: Vitals:   07/12/19 2351 07/13/19 0500 07/13/19 0547 07/13/19 0811  BP: (!) 148/49  (!) 156/50   Pulse: 72  71 70  Resp: 19  18   Temp:   98.4 F (36.9 C)   TempSrc: Oral  Oral   SpO2: 99%  100% 99%  Weight:  101.1 kg    Height:       Weight change: 1.8 kg  Intake/Output Summary (Last 24  hours) at 07/13/2019 0848 Last data filed at 07/13/2019 0400 Gross per 24 hour  Intake 532.03 ml  Output 375 ml  Net 157.03 ml       Labs: Basic Metabolic Panel: Recent Labs  Lab 07/09/19 0318 07/10/19 0301 07/11/19 0319 07/12/19 0327 07/13/19 0316  NA 135 137 134* 132* 131*  K 3.9 3.8 4.1 4.1 4.3  CL 104 104 101 96* 96*  CO2 17* 21* 17* 21* 18*  GLUCOSE 151* 184* 155* 187* 189*  BUN 103* 70* 82* 72* 105*  CREATININE 5.94* 4.65* 5.38* 5.40* 6.46*  CALCIUM 8.9 8.6* 8.9 8.9 8.7*  PHOS 4.2 3.4 5.0*  --   --    Liver Function Tests: Recent Labs  Lab 07/07/19 1057 07/09/19 0318 07/10/19 0301 07/11/19 0319  AST 13*  --   --   --   ALT 14  --   --   --   ALKPHOS 61  --   --   --   BILITOT 0.6  --   --   --   PROT 6.4*  --   --   --   ALBUMIN 3.5 2.7* 2.6* 2.5*   No results for input(s): LIPASE, AMYLASE in the last 168 hours. No results for input(s): AMMONIA in the  last 168 hours. CBC: Recent Labs  Lab 07/07/19 1057 07/08/19 0336 07/09/19 0318 07/10/19 0301 07/11/19 0319 07/12/19 0327 07/13/19 0316  WBC 16.8*  --  14.9* 16.5* 20.7* 24.2* 21.4*  NEUTROABS 11.3*  --   --   --   --   --   --   HGB 8.5*  --  7.3* 7.8* 8.0* 7.8* 7.2*  HCT 26.2*   < > 21.7* 23.5* 24.5* 22.6* 21.8*  MCV 99.2  --  94.8 95.5 98.4 93.0 96.5  PLT 248  --  263 239 253 270 261   < > = values in this interval not displayed.   Cardiac Enzymes: No results for input(s): CKTOTAL, CKMB, CKMBINDEX, TROPONINI in the last 168 hours. CBG: Recent Labs  Lab 07/12/19 0637 07/12/19 1131 07/12/19 1547 07/12/19 2131 07/13/19 0632  GLUCAP 190* 198* 191* 190* 182*    Iron Studies:  No results for input(s): IRON, TIBC, TRANSFERRIN, FERRITIN in the last 72 hours. Studies/Results: DG CHEST PORT 1 VIEW  Result Date: 07/12/2019 CLINICAL DATA:  Hypoxia and shortness of breath. EXAM: PORTABLE CHEST 1 VIEW COMPARISON:  07/10/2019 and earlier studies. FINDINGS: Masslike right perihilar opacity has  developed since the prior chest radiograph. Lungs also demonstrate prominent bronchovascular markings and mild interstitial thickening, unchanged from the prior study. No convincing pleural effusion.  No pneumothorax. Cardiac silhouette is normal in size. No mediastinal or left hilar masses. IMPRESSION: 1. Masslike right perihilar opacity developing since the prior exam. This rapid change is consistent with infection or inflammation. Suspect pneumonia. Electronically Signed   By: Lajean Manes M.D.   On: 07/12/2019 09:10    Medications: Infusions: . ampicillin-sulbactam (UNASYN) IV 3 g (07/13/19 0705)  . heparin 1,400 Units/hr (07/13/19 0400)    Scheduled Medications: . sodium chloride   Intravenous Once  . aspirin EC  81 mg Oral QHS  . atorvastatin  20 mg Oral Daily  . [START ON 07/14/2019] carvedilol  25 mg Oral BID WC  . Chlorhexidine Gluconate Cloth  6 each Topical Q0600  . Chlorhexidine Gluconate Cloth  6 each Topical Q0600  . Chlorhexidine Gluconate Cloth  6 each Topical Q0600  . cloNIDine  0.1 mg Oral BID  . darbepoetin (ARANESP) injection - DIALYSIS  60 mcg Intravenous Q Sat-HD  . finasteride  5 mg Oral QPM  . [START ON 07/14/2019] hydrALAZINE  100 mg Oral TID  . insulin aspart  0-9 Units Subcutaneous TID WC  . ipratropium-albuterol  3 mL Nebulization TID  . [START ON 07/14/2019] isosorbide dinitrate  30 mg Oral TID  . LORazepam  0.5 mg Intravenous Once  . multivitamin with minerals  1 tablet Oral Daily  . ranolazine  1,000 mg Oral BID  . tamsulosin  0.4 mg Oral QPC supper  . Warfarin - Pharmacist Dosing Inpatient   Does not apply q1800    have reviewed scheduled and prn medications.  Physical Exam: General:  on 2 L of oxygen, able to lie flat, not in distress Heart:RRR, s1s2 nl Lungs: Bibasal crackles b/l, no wheezing Abdomen:soft, Non-tender, non-distended Extremities: Bilateral lower extremity pitting edema Dialysis Access: Left AV fistula has good thrill and bruit.  Blister proximal-  Does not appear infected-  Bandage to collect exudate  Lala Been A Jamise Pentland 07/13/2019,8:48 AM  LOS: 6 days

## 2019-07-13 NOTE — Progress Notes (Signed)
IR consulted for dialysis catheter placement.  Patient has received coumadin x3 days.  INR 1.8.   NPO p MN tonight.  Hold coumadin dose tonight.  Plan to review with IR MD and move forward with temp vs. Tunneled catheter placement tomorrow as able.   Spoke with RN.   Brynda Greathouse, MS RD PA-C

## 2019-07-13 NOTE — Progress Notes (Signed)
Physical Therapy Treatment Patient Details Name: Ray Merritt MRN: [id number] DOB: [birth year] Today's Date: 07/13/2019    History of Present Illness 83yo male c/o SOB with blood tinged sputum, nausea/vomiting. Covid negative x2, flu panel negative. Admitted with sepsis secondary to CAP, acute on chronic CHF. PMH DM, CAD, CKD, CA, hx shoulder surgery, cardac cath    PT Comments    Pt tolerated treatment session well with some improvement in sitting balance, but otherwise requiring significant assistance for all functional mobility. Pt is generally weak throughout body, and unable to stand with assistance x2 this session. Pt expressing desire to discharge home with assistance of son's, however PT feels that this patient remains too weak at this point to safely be managed at home. If pt continues to desire discharge home then it may be beneficial for caregiver to visit for family training on DME.   Follow Up Recommendations  SNF;Supervision/Assistance - 24 hour(pt reporting desire to D/C home with assist of sons)     Equipment Recommendations  Rolling walker with 5" wheels;Wheelchair (measurements PT);Wheelchair cushion (measurements PT);Hospital bed;Other (comment)(mechanical lift also if D/C home)    Recommendations for Other Services       Precautions / Restrictions Precautions Precautions: Fall;Other (comment) Precaution Comments: watch vitals Restrictions Weight Bearing Restrictions: No    Mobility  Bed Mobility Overal bed mobility: Needs Assistance Bed Mobility: Supine to Sit;Sit to Supine;Rolling Rolling: Mod assist   Supine to sit: Max assist;+2 for physical assistance;HOB elevated Sit to supine: Max assist      Transfers Overall transfer level: Needs assistance Equipment used: Rolling walker (2 wheeled) Transfers: Sit to/from Stand Sit to Stand: Total assist         General transfer comment: unable to clear buttocks on 3 sit to stand  attempts  Ambulation/Gait                 Stairs             Wheelchair Mobility    Modified Rankin (Stroke Patients Only)       Balance Overall balance assessment: Needs assistance Sitting-balance support: Bilateral upper extremity supported;Feet supported Sitting balance-Leahy Scale: Poor Sitting balance - Comments: modA-minG Postural control: Left lateral lean;Posterior lean                                  Cognition Arousal/Alertness: Awake/alert Behavior During Therapy: WFL for tasks assessed/performed Overall Cognitive Status: Within Functional Limits for tasks assessed                                        Exercises General Exercises - Lower Extremity Ankle Circles/Pumps: AROM;Both;10 reps Straight Leg Raises: AROM;Both;5 reps    General Comments General comments (skin integrity, edema, etc.): VSS on RA, pt's sister present and assisting      Pertinent Vitals/Pain Pain Assessment: Faces Faces Pain Scale: Hurts little more Pain Location: neck Pain Descriptors / Indicators: Discomfort Pain Intervention(s): Limited activity within patient's tolerance    Home Living                      Prior Function            PT Goals (current goals can now be found in the care plan section) Acute Rehab PT Goals Patient Stated Goal: get stronger,  go home Progress towards PT goals: Not progressing toward goals - comment(continues to require significant assistance)    Frequency    Min 2X/week      PT Plan Discharge plan needs to be updated    Co-evaluation              AM-PAC PT "6 Clicks" Mobility   Outcome Measure  Help needed turning from your back to your side while in a flat bed without using bedrails?: A Lot Help needed moving from lying on your back to sitting on the side of a flat bed without using bedrails?: Total Help needed moving to and from a bed to a chair (including a wheelchair)?:  Total Help needed standing up from a chair using your arms (e.g., wheelchair or bedside chair)?: Total Help needed to walk in hospital room?: Total Help needed climbing 3-5 steps with a railing? : Total 6 Click Score: 7    End of Session Equipment Utilized During Treatment: Gait belt Activity Tolerance: Patient tolerated treatment well Patient left: in bed;with call bell/phone within reach;with bed alarm set;with family/visitor present Nurse Communication: Mobility status PT Visit Diagnosis: Unsteadiness on feet (R26.81);Muscle weakness (generalized) (M62.81);Difficulty in walking, not elsewhere classified (R26.2)     Time: 1157-2620 PT Time Calculation (min) (ACUTE ONLY): 23 min  Charges:  $Therapeutic Activity: 23-37 mins                     Zenaida Niece, PT, DPT Acute Rehabilitation Pager: [phone]    Zenaida Niece 07/13/2019, 5:24 PM

## 2019-07-13 NOTE — Progress Notes (Signed)
Did not give ordered Ativan tonight

## 2019-07-13 NOTE — Plan of Care (Signed)
Nutrition Education Note  RD consulted for Renal Education. Provided Renal Pyramid Handout to patient/family. Reviewed food groups and provided written recommended serving sizes specifically determined for patient's current nutritional status.   Explained why diet restrictions are needed and provided lists of foods to limit/avoid that are high potassium, sodium, and phosphorus. Provided specific recommendations on safer alternatives of these foods. Strongly encouraged compliance of this diet.   Discussed importance of protein intake at each meal and snack. Provided examples of how to maximize protein intake throughout the day. Discussed need for fluid restriction with dialysis, importance of minimizing weight gain between HD treatments, and renal-friendly beverage options.  Encouraged pt to discuss specific diet questions/concerns with RD at HD outpatient facility. Teach back method used.  Pt reports having a great appetite. Tries to avoid high sodium high potassium foods. Discussed alternative vegetables he could other than canned. Answered all questions.   Expect fair compliance.  Body mass index is 27.86 kg/m. Pt meets criteria for overweight based on current BMI.  Current diet order is renal/carb mod, patient is consuming approximately 75-100% of meals at this time. Labs and medications reviewed. No further nutrition interventions warranted at this time. RD contact information provided. If additional nutrition issues arise, please re-consult RD.  Mariana Single RD, LDN Clinical Nutrition Pager # [phone]

## 2019-07-13 NOTE — Progress Notes (Signed)
ANTICOAGULATION CONSULT NOTE - Follow Up Consult  Pharmacy Consult for Heparin and Warfarin Indication: atrial fibrillation  No Known Allergies  Patient Measurements: Height: 6\' 3"  (190.5 cm) Weight: 222 lb 14.2 oz (101.1 kg) IBW/kg (Calculated) : 84.5 Heparin Dosing Weight: 101.1 kg  Vital Signs: Temp: 98.4 F (36.9 C) (12/23 0547) Temp Source: Oral (12/23 0547) BP: 156/50 (12/23 0547) Pulse Rate: 70 (12/23 0811)  Labs: Recent Labs    07/11/19 0319 07/11/19 1544 07/11/19 1802 07/12/19 0327 07/13/19 0316  HGB 8.0*  --   --  7.8* 7.2*  HCT 24.5*  --   --  22.6* 21.8*  PLT 253  --   --  270 261  LABPROT 15.5*  --   --  17.7* 20.6*  INR 1.2  --   --  1.5* 1.8*  HEPARINUNFRC 0.39  --   --  0.49 0.27*  CREATININE 5.38*  --   --  5.40* 6.46*  TROPONINIHS  --  199* 229* 280*  --     Assessment: 83 year old male presented 12/17 with blood tinged sputum and bilateral LLE edema. EKG suggestive of ischemia. Heparin was stopped 12/18, but patient went into atrial fibrillation and heparin was restarted. No anticoagulation prior to admission. Pharmacy asked to start warfarin on 12/20.   Heparin level is just below goal (0.27) on 1400 units/hr.  INR up to 1.8 after Warfarin 7.5 mg x 2 days then 5 mg yesterday. Hgb trended down to 7.2, no bleeding reported.   Goal of Therapy:  INR 2-3 Heparin level 0.3-0.7 units/ml Monitor platelets by anticoagulation protocol: Yes   Plan:  Increase heparin drip to 1500 units/hr Warfarin 5 mg x 1 today Daily heparin level, PT/INR and CBC. Stop IV heparin when INR >2   Arty Baumgartner, RPh Phone: [phone] 07/13/2019  10:27 AM

## 2019-07-13 NOTE — Progress Notes (Signed)
Subjective:  Feels fatigued. Alert and recognized me.   Intake/Output from previous day:  I/O last 3 completed shifts: In: 1084.6 [P.O.:450; I.V.:491.2; IV Piggyback:143.4] Out: 3095 [Urine:1175; Other:1920] No intake/output data recorded.  Blood pressure (!) 156/50, pulse 70, temperature 98.4 F (36.9 C), temperature source Oral, resp. rate 18, height '6\' 3"'  (1.905 m), weight 101.1 kg, SpO2 99 %. Physical Exam  Constitutional: He is oriented to person, place, and time. Vital signs are normal. He appears well-developed and well-nourished. No distress.  Mildly obese  HENT:  Head: Normocephalic and atraumatic.  Cardiovascular: Normal rate, regular rhythm and intact distal pulses.  Murmur heard.  Early systolic murmur is present with a grade of 2/6 at the upper right sternal border. Pulses:      Carotid pulses are on the right side with bruit.      Femoral pulses are 2+ on the right side with bruit and 2+ on the left side.      Popliteal pulses are 2+ on the right side and 2+ on the left side.       Dorsalis pedis pulses are 1+ on the right side and 1+ on the left side.       Posterior tibial pulses are 2+ on the right side and 2+ on the left side.  Bilateral 2+ pitting edema  Pulmonary/Chest: Effort normal. No accessory muscle usage. No respiratory distress. He has rales (scattered bilateral. Decreased effort and breath sounds at bases).  Abdominal: Soft. Bowel sounds are normal.  Obese and pannus present  Musculoskeletal:        General: Normal range of motion.     Cervical back: Normal range of motion.  Neurological: He is alert and oriented to person, place, and time.  Skin: Skin is warm and dry.  Vitals reviewed.   Lab Results: BMP BNP (last 3 results) Recent Labs    07/07/19 1057 07/08/19 0336 07/10/19 0301  BNP 360.3* 935.9* 195.8*    ProBNP (last 3 results) No results for input(s): PROBNP in the last 8760 hours. BMP Latest Ref Rng & Units 07/13/2019 07/12/2019  07/11/2019  Glucose 70 - 99 mg/dL 189(H) 187(H) 155(H)  BUN 8 - 23 mg/dL 105(H) 72(H) 82(H)  Creatinine 0.61 - 1.24 mg/dL 6.46(H) 5.40(H) 5.38(H)  Sodium 135 - 145 mmol/L 131(L) 132(L) 134(L)  Potassium 3.5 - 5.1 mmol/L 4.3 4.1 4.1  Chloride 98 - 111 mmol/L 96(L) 96(L) 101  CO2 22 - 32 mmol/L 18(L) 21(L) 17(L)  Calcium 8.9 - 10.3 mg/dL 8.7(L) 8.9 8.9   Hepatic Function Latest Ref Rng & Units 07/11/2019 07/10/2019 07/09/2019  Total Protein 6.5 - 8.1 g/dL - - -  Albumin 3.5 - 5.0 g/dL 2.5(L) 2.6(L) 2.7(L)  AST 15 - 41 U/L - - -  ALT 0 - 44 U/L - - -  Alk Phosphatase 38 - 126 U/L - - -  Total Bilirubin 0.3 - 1.2 mg/dL - - -   CBC Latest Ref Rng & Units 07/13/2019 07/12/2019 07/11/2019  WBC 4.0 - 10.5 K/uL 21.4(H) 24.2(H) 20.7(H)  Hemoglobin 13.0 - 17.0 g/dL 7.2(L) 7.8(L) 8.0(L)  Hematocrit 39.0 - 52.0 % 21.8(L) 22.6(L) 24.5(L)  Platelets 150 - 400 K/uL 261 270 253   Lipid Panel     Component Value Date/Time   CHOL 115 03/15/2018 0917   TRIG 71 07/07/2019 1057   HDL 33.20 (L) 03/15/2018 0917   CHOLHDL 3 03/15/2018 0917   VLDL 19.4 03/15/2018 0917   LDLCALC 62 03/15/2018 0917   LDLDIRECT  60.0 05/12/2017 0909   Cardiac Panel (last 3 results) No results for input(s): CKTOTAL, CKMB, TROPONINI, RELINDX in the last 72 hours.  HEMOGLOBIN A1C Lab Results  Component Value Date   HGBA1C 5.4 07/07/2019   MPG 108.28 07/07/2019   TSH No results for input(s): TSH in the last 8760 hours. Imaging: DG CHEST PORT 1 VIEW  Result Date: 07/12/2019 CLINICAL DATA:  Hypoxia and shortness of breath. EXAM: PORTABLE CHEST 1 VIEW COMPARISON:  07/10/2019 and earlier studies. FINDINGS: Masslike right perihilar opacity has developed since the prior chest radiograph. Lungs also demonstrate prominent bronchovascular markings and mild interstitial thickening, unchanged from the prior study. No convincing pleural effusion.  No pneumothorax. Cardiac silhouette is normal in size. No mediastinal or left  hilar masses. IMPRESSION: 1. Masslike right perihilar opacity developing since the prior exam. This rapid change is consistent with infection or inflammation. Suspect pneumonia. Electronically Signed   By: Lajean Manes M.D.   On: 07/12/2019 09:10    Cardiac Studies:  Coronary Angiogram [05/22/2015]: PCI mid LAD with 3.5x26 mm Resolute DES. Periprocedural MI with septal branch occlusion.  Lower Extremity Dopplers [03/22/2015]: No hemodynamically significant stenoses are identified in the right lower extremity arterial system. Bilateral deep formal arteries show biphasic waveforms with < 50% stenosis. Moderate velocity increase at the left posterior tibial artery suggestive of >50% stenosis with diffuse disease in the AT. This exam reveals normal perfusion of both the lower extremities with ABI of 1.01.  Lexiscan Myoview Stress Test 03/30/19 Resting EKG the onset normal sinus rhythm, nonspecific T abnormality. Stress EKG is non-diagnostic as a pharmacologic stress test using Lexiscan. The t.i.d. index is 0.86. Left ventricle is dilated both at rest and stress images at 281 mL. Perfusion images reveal a large sized inferior, inferoseptal non-transmural myocardial infarction with very minimal peri-infarct ischemia. Left ventricle systolic function Evaluated by QGS was moderately depressed at 37% with global hypokinesis. This is a high risk study. No significant change compared to the report of nuclear stress test on 12/19/2016 although ischemia was not evident previously.  Echocardiogram 03/22/2019: Left ventricle cavity is mildly dilated. Moderate concentric hypertrophy of the left ventricle. Left ventricle regional wall motion findings: Basal anteroseptal and Mid anteroseptal hypokinesis. Moderately depressed LV systolic function with EF 35%. Doppler evidence of grade II (pseudonormal) diastolic dysfunction, elevated LAP.  Left atrial cavity is severely dilated. Trileaflet aortic valve. Trace  aortic regurgitation. Moderate (Grade III) mitral regurgitation. Moderate tricuspid regurgitation. Estimated pulmonary artery systolic pressure is 54 mmHg.  11/03/2017, there is interval worsening of systolic and diastolic function, valvular regurgitation, and pulmonary hypertension.  Carotid artery duplex 05/10/2019: Stenosis in the right external carotid artery (<50%). Stenosis in the left internal carotid artery (50-69%). Stenosis in the left external carotid artery (<50%). Antegrade right vertebral artery flow. Antegrade left vertebral artery flow. Compared to the study done on 04/30/2018, left ICA stenosis severity has progressed from less than 50%. Follow up in six months is appropriate if clinically indicated.   EKG 07/08/2019: Atrial fibrillation with controlled ventricular response at the rate of 89 bpm, normal axis, ST-T abnormality, inferior and lateral ischemia.  Normal QT interval.  EKG 03/18/2019: Normal sinus rhythm at 66 bpm, normal axis, T wave abnormality in inferolateral leads, cannot exclude ischemia.   Scheduled Meds: . sodium chloride   Intravenous Once  . aspirin EC  81 mg Oral QHS  . atorvastatin  20 mg Oral Daily  . [START ON 07/14/2019] carvedilol  25 mg Oral BID WC  .  Chlorhexidine Gluconate Cloth  6 each Topical Q0600  . Chlorhexidine Gluconate Cloth  6 each Topical Q0600  . Chlorhexidine Gluconate Cloth  6 each Topical Q0600  . cloNIDine  0.1 mg Oral BID  . [START ON 07/16/2019] darbepoetin (ARANESP) injection - DIALYSIS  150 mcg Intravenous Q Sat-HD  . finasteride  5 mg Oral QPM  . [START ON 07/14/2019] hydrALAZINE  100 mg Oral TID  . insulin aspart  0-9 Units Subcutaneous TID WC  . ipratropium-albuterol  3 mL Nebulization TID  . [START ON 07/14/2019] isosorbide dinitrate  30 mg Oral TID  . LORazepam  0.5 mg Intravenous Once  . multivitamin with minerals  1 tablet Oral Daily  . ranolazine  1,000 mg Oral BID  . tamsulosin  0.4 mg Oral QPC supper  .  Warfarin - Pharmacist Dosing Inpatient   Does not apply q1800   Continuous Infusions: . ampicillin-sulbactam (UNASYN) IV 3 g (07/13/19 0705)  . heparin 1,400 Units/hr (07/13/19 0400)   PRN Meds:.acetaminophen **OR** acetaminophen, ondansetron **OR** ondansetron (ZOFRAN) IV  Assessment/Plan:  1.  Paroxysmal atrial fibrillation CHA2DS2-VASc Score is 5 (A, HTN, Vasc dz, DM) .  Yearly risk of stroke: 6.7%.  Score of 1=1.3; 2=2.2; 3=3.2; 4=4; 5=6.7; 6=9.8; 7=>9.8) -(CHF; HTN; vasc disease DM,  Male = 1; Age <65 =0; 65-74 = 1,  >75 =2; stroke = 2).  2. NSTEMI 3. PNA 4. ESRD now on HD 5. Anemia S/O transfusion and iron transfusion.  Rec: Patient is back in sinus rhythm, presently on heparin and Coumadin, goal INR 2-3, continue aspirin for NSTEMI, if he has any bleeding diathesis, we could consider only Coumadin and keep INR closer to 2.0.  With regard to coronary artery disease and NSTEMI, conservative therapy only given his medical comorbidity and poor overall medical status presently.  Suspect he will need  skilled nursing facility placement.  Nothing further to add from cardiac standpoint, please call if questions.   Adrian Prows, M.D. 07/13/2019, 9:27 AM [facility] Pager: [phone] Office: [phone] If no answer: [phone]

## 2019-07-14 ENCOUNTER — Inpatient Hospital Stay (HOSPITAL_COMMUNITY): Payer: Medicare Other

## 2019-07-14 HISTORY — PX: IR FLUORO GUIDE CV LINE RIGHT: IMG2283

## 2019-07-14 HISTORY — PX: IR US GUIDE VASC ACCESS RIGHT: IMG2390

## 2019-07-14 LAB — CBC
HCT: 19.6 % — ABNORMAL LOW (ref 39.0–52.0)
Hemoglobin: 6.6 g/dL — CL (ref 13.0–17.0)
MCH: 31.9 pg (ref 26.0–34.0)
MCHC: 33.7 g/dL (ref 30.0–36.0)
MCV: 94.7 fL (ref 80.0–100.0)
Platelets: 294 10*3/uL (ref 150–400)
RBC: 2.07 MIL/uL — ABNORMAL LOW (ref 4.22–5.81)
RDW: 13.2 % (ref 11.5–15.5)
WBC: 21.7 10*3/uL — ABNORMAL HIGH (ref 4.0–10.5)
nRBC: 0.1 % (ref 0.0–0.2)

## 2019-07-14 LAB — BASIC METABOLIC PANEL
Anion gap: 19 — ABNORMAL HIGH (ref 5–15)
BUN: 118 mg/dL — ABNORMAL HIGH (ref 8–23)
CO2: 17 mmol/L — ABNORMAL LOW (ref 22–32)
Calcium: 8.8 mg/dL — ABNORMAL LOW (ref 8.9–10.3)
Chloride: 92 mmol/L — ABNORMAL LOW (ref 98–111)
Creatinine, Ser: 6.81 mg/dL — ABNORMAL HIGH (ref 0.61–1.24)
GFR calc Af Amer: 8 mL/min — ABNORMAL LOW (ref >60)
GFR calc non Af Amer: 7 mL/min — ABNORMAL LOW (ref >60)
Glucose, Bld: 161 mg/dL — ABNORMAL HIGH (ref 70–99)
Potassium: 4.5 mmol/L (ref 3.5–5.1)
Sodium: 128 mmol/L — ABNORMAL LOW (ref 135–145)

## 2019-07-14 LAB — PREPARE RBC (CROSSMATCH)

## 2019-07-14 LAB — PROTIME-INR
INR: 2.2 — ABNORMAL HIGH (ref 0.8–1.2)
Prothrombin Time: 24.1 seconds — ABNORMAL HIGH (ref 11.4–15.2)

## 2019-07-14 LAB — GLUCOSE, CAPILLARY
Glucose-Capillary: 156 mg/dL — ABNORMAL HIGH (ref 70–99)
Glucose-Capillary: 126 mg/dL — ABNORMAL HIGH (ref 70–99)
Glucose-Capillary: 130 mg/dL — ABNORMAL HIGH (ref 70–99)

## 2019-07-14 LAB — HEPARIN LEVEL (UNFRACTIONATED): Heparin Unfractionated: 0.3 IU/mL (ref 0.30–0.70)

## 2019-07-14 MED ORDER — LIDOCAINE HCL 1 % IJ SOLN
INTRAMUSCULAR | Status: DC | PRN
  Administered 2019-07-14: 10 mL

## 2019-07-14 MED ORDER — LIDOCAINE HCL 1 % IJ SOLN
INTRAMUSCULAR | Status: AC
  Filled 2019-07-14: qty 20

## 2019-07-14 MED ORDER — WARFARIN SODIUM 7.5 MG PO TABS
7.5000 mg | ORAL_TABLET | Freq: Once | ORAL | Status: AC
  Administered 2019-07-14: 7.5 mg via ORAL
  Filled 2019-07-14: qty 1

## 2019-07-14 MED ORDER — PENTAFLUOROPROP-TETRAFLUOROETH EX AERO: 1.0000 "application " | INHALATION_SPRAY | CUTANEOUS | Status: DC | PRN

## 2019-07-14 MED ORDER — SODIUM CHLORIDE 0.9 % IV SOLN: 100.0000 mL | INTRAVENOUS | Status: DC | PRN

## 2019-07-14 MED ORDER — HEPARIN SODIUM (PORCINE) 1000 UNIT/ML DIALYSIS: 1000.0000 [IU] | INTRAMUSCULAR | Status: DC | PRN

## 2019-07-14 MED ORDER — LIDOCAINE HCL (PF) 1 % IJ SOLN: 5.0000 mL | INTRAMUSCULAR | Status: DC | PRN

## 2019-07-14 MED ORDER — ALTEPLASE 2 MG IJ SOLR: 2.0000 mg | Freq: Once | INTRAMUSCULAR | Status: DC | PRN

## 2019-07-14 MED ORDER — HEPARIN SODIUM (PORCINE) 1000 UNIT/ML IJ SOLN
INTRAMUSCULAR | Status: AC
  Filled 2019-07-14: qty 1

## 2019-07-14 MED ORDER — SODIUM CHLORIDE 0.9% IV SOLUTION: Freq: Once | INTRAVENOUS | Status: DC

## 2019-07-14 MED ORDER — LIDOCAINE-PRILOCAINE 2.5-2.5 % EX CREA: 1.0000 "application " | TOPICAL_CREAM | CUTANEOUS | Status: DC | PRN

## 2019-07-14 NOTE — Procedures (Signed)
  Procedure: R int jug HD catheter placement   EBL:   minimal Complications:  none immediate  See full dictation in [facility].  Dillard Cannon MD Main # [phone] Pager  [phone]

## 2019-07-14 NOTE — Progress Notes (Signed)
PROGRESS NOTE  Ray Merritt KXF:[id number] DOB: 03/17/1936 DOA: 07/07/2019 PCP: [name], MD  HPI/Recap of past 48 hours: 83 year old gentleman with history of hypertension, chronic kidney disease stage IV nearing hemodialysis, coronary artery disease, recent abnormal stress test, type 2 diabetes and CLL presented to the emergency room with shortness of breath, cough with blood-tinged sputum since morning.  He had some atypical chest pain.  Recently with weight gain and extremity edema.  He does have left AV arm AV fistula that has not been used yet. In the emergency room, febrile with temperature 100.6, blood pressures elevated, 2 L nasal cannula placed for comfort.  WBC 16.8.  Creatinine 6.61.  EKG revealed ST depression in inferior lateral leads.  Mildly elevated troponins.  Chest x-ray with fluid in the fissures, suspected atelectasis versus pneumonia.COVID-19 point-of-care and PCR test negative.    Admitted for IV antibiotics and heparin infusion with cardiology and nephrology consult.  Acute blood loss anemia requiring 1 unit irradiated RBC 12/19.  Progressing to ESRD first HD on 07/09/2019, then 07/11/19.  Nephrology has outpatient HD spot - can DC as early as 12/26 after dialysis that day. Continues to have issues with dialysis access which may delay disposition.  Subjective: No acute issues or events overnight,   Assessment/Plan: Principal Problem:   Sepsis ([place]) Active Problems:   CLL (chronic lymphocytic leukemia) (HCC)   Type 2 diabetes, HbA1c goal < 7% (HCC)   Acute renal failure superimposed on chronic kidney disease (HCC)   Acute on chronic combined systolic and diastolic CHF (congestive heart failure) (HCC)   Hypertensive urgency   Urinary tract infection   Abnormal EKG   Elevated troponin  Sepsis secondary to Enterococcous Faecalis community-acquired pneumonia, POA Concurrent E. coli UTI, likely POA -Urine culture grew greater than 100,000 colonies of  E. coli, pansensitive. -Sputum culture grew abundant enterococous faecalis -Azithromycin/ceftriaxone discontinued, transition to Unasyn on 07/12/2019 -Tentative stop date 07/16/19 for Unasyn unless recurrent fever/worsening clinical course -Chest x-ray personally reviewed -right middle lobe opacification, not noted on x-ray 07/10/2019 -Without fever since 17th -WBC 16>>21>>24>>21>>21 -Blood cultures continue to be preliminarily negative  New ESRD on HD First HD on 07/09/2019 Next HD completed on 07/11/2019  HD canceled on 07/12/2019 due to swelling and blister AV fistula.   Unable to access for dialysis 07/13/2019 Right internal jugular hemodialysis catheter placed 07/14/2019 successfully Patient has an outpatient HD spot arranged by nephrology, will need dialysis at our facility on 07/16/2019 in the morning.  Anemia of chronic disease complicated by iron deficiency, ongoing Received 1 unit of irradiated RBC on 07/09/2019 2 additional units PRBC transfused 07/14/19 (Hgb 6.6 am 24th) No sign of overt bleeding Nephrology managing with ESA and iron repletion.  Worsening hypervolemic hyponatremia Sodium 134>>132>>131>>128 Volume managed with hemodialysis Plan hemodialysis later today, continues to be delayed due to access issues  Volume overload in the setting of new ESRD Management Per nephrology/dialysis  Elevated troponin likely demand ischemia in the setting of hypoxia Troponin peaked at 280 Denies anginal symptoms at the time of this exam.  NSTEMI Followed by cardiology On aspirin and Lipitor Continue cardiac medications as recommended by cardiology.  Paroxysmal A. fib CHA2DS2-VASc score 6 Anticoagulate with Coumadin per cardiology Continue warfarin, managed by pharmacy  Subtherapeutic INR, resolving Management per above Goal INR between 2 and 3: per discussion with cardiology can keep patient closer to 2 given bleeding issues as above with dialysis Daily INR Lab  Results  Component Value Date   INR  2.2 (H) 07/14/2019   INR 1.8 (H) 07/13/2019   INR 1.5 (H) 07/12/2019    BPH Stable Continue home medications Continue to monitor urine output  Acute on chronic combined systolic and diastolic CHF Continue strict I's and O's and daily weight Continue cardiac medications Volume managed by hemodialysis and IV Lasix as needed for volume overload Received 60 mg IV Lasix 1 today  Essential hypertension Likely will need to reduce doses of antihypertensives Continue to closely monitor vital signs Continue clonidine at lower dose 0.1 mg twice daily Hold off Coreg and hydralazine to give room for diuresing as needed before hemodialysis  CLL Will need to follow-up outpatient  Physical debility, ambulatory dysfunction PT OT to assess Continue PT OT with assistance and fall precautions.  DVT prophylaxis:  Coumadin Code Status:  DNR confirmed at bedside 07/14/2019 Family Communication: None  Disposition Plan: Pending clinical improvement, nephrology and cardiology signing off.  Consultants:   Cardiology, Dr. Einar Gip  Nephrology, Dr. Moshe Cipro  Interventional radiology  Procedures:   None  Antimicrobials:   Rocephin, azithromycin, 07/07/2019--- 07/11/19  Unasyn 07/11/2019---ongoing -tentative stop date 07/16/2019  Objective: Vitals:   07/13/19 2008 07/13/19 2035 07/14/19 0555 07/14/19 0623  BP:  (!) 151/56  (!) 150/74  Pulse: 80 70  75  Resp: 20 18  20   Temp:  97.9 F (36.6 C)  98.4 F (36.9 C)  TempSrc:  Oral  Oral  SpO2: 99% 99%  95%  Weight:   102.2 kg   Height:        Intake/Output Summary (Last 24 hours) at 07/14/2019 0729 Last data filed at 07/13/2019 2037 Gross per 24 hour  Intake 996.69 ml  Output 725 ml  Net 271.69 ml   Filed Weights   07/12/19 0438 07/13/19 0500 07/14/19 0555  Weight: 101.6 kg 101.1 kg 102.2 kg    Exam:  . General: 83 y.o. year-old male cardiopulmonary.  Uncomfortable due to  wheezing.  Alert and oriented x3.   . Cardiovascular: Regular rate and rhythm no rubs or gallops.   Marland Kitchen Respiratory: Diffuse wheezing bilaterally. . Abdomen: Soft nontender normal bowel sounds present. Musculoskeletal: Lower extremity edema bilaterally.   Psychiatry: Mood is appropriate for condition and setting.  Data Reviewed: CBC: Recent Labs  Lab 07/07/19 1057 07/08/19 0336 07/10/19 0301 07/11/19 0319 07/12/19 0327 07/13/19 0316 07/14/19 0254  WBC 16.8*  --  16.5* 20.7* 24.2* 21.4* 21.7*  NEUTROABS 11.3*  --   --   --   --   --   --   HGB 8.5*  --  7.8* 8.0* 7.8* 7.2* 6.6*  HCT 26.2*   < > 23.5* 24.5* 22.6* 21.8* 19.6*  MCV 99.2  --  95.5 98.4 93.0 96.5 94.7  PLT 248  --  239 253 270 261 294   < > = values in this interval not displayed.   Basic Metabolic Panel: Recent Labs  Lab 07/08/19 0336 07/09/19 0318 07/10/19 0301 07/11/19 0319 07/12/19 0327 07/13/19 0316 07/14/19 0254  NA 136 135 137 134* 132* 131* 128*  K 4.3 3.9 3.8 4.1 4.1 4.3 4.5  CL 107 104 104 101 96* 96* 92*  CO2 16* 17* 21* 17* 21* 18* 17*  GLUCOSE 131* 151* 184* 155* 187* 189* 161*  BUN 95* 103* 70* 82* 72* 105* 118*  CREATININE 5.83* 5.94* 4.65* 5.38* 5.40* 6.46* 6.81*  CALCIUM 9.2 8.9 8.6* 8.9 8.9 8.7* 8.8*  PHOS 4.2 4.2 3.4 5.0*  --   --   --  GFR: Estimated Creatinine Clearance: 11 mL/min (A) (by C-G formula based on SCr of 6.81 mg/dL (H)). Liver Function Tests: Recent Labs  Lab 07/07/19 1057 07/08/19 0336 07/09/19 0318 07/10/19 0301 07/11/19 0319  AST 13*  --   --   --   --   ALT 14  --   --   --   --   ALKPHOS 61  --   --   --   --   BILITOT 0.6  --   --   --   --   PROT 6.4*  --   --   --   --   ALBUMIN 3.5 3.1* 2.7* 2.6* 2.5*   No results for input(s): LIPASE, AMYLASE in the last 168 hours. No results for input(s): AMMONIA in the last 168 hours. Coagulation Profile: Recent Labs  Lab 07/10/19 0836 07/11/19 0319 07/12/19 0327 07/13/19 0316 07/14/19 0254  INR 1.1 1.2  1.5* 1.8* 2.2*   Cardiac Enzymes: No results for input(s): CKTOTAL, CKMB, CKMBINDEX, TROPONINI in the last 168 hours. BNP (last 3 results) No results for input(s): PROBNP in the last 8760 hours. HbA1C: No results for input(s): HGBA1C in the last 72 hours. CBG: Recent Labs  Lab 07/13/19 0632 07/13/19 1324 07/13/19 1633 07/13/19 2112 07/14/19 0640  GLUCAP 182* 171* 133* 161* 156*   Lipid Profile: No results for input(s): CHOL, HDL, LDLCALC, TRIG, CHOLHDL, LDLDIRECT in the last 72 hours. Thyroid Function Tests: No results for input(s): TSH, T4TOTAL, FREET4, T3FREE, THYROIDAB in the last 72 hours. Anemia Panel: No results for input(s): VITAMINB12, FOLATE, FERRITIN, TIBC, IRON, RETICCTPCT in the last 72 hours. Urine analysis:    Component Value Date/Time   COLORURINE YELLOW 07/07/2019 1409   APPEARANCEUR HAZY (A) 07/07/2019 1409   LABSPEC 1.014 07/07/2019 1409   PHURINE 5.0 07/07/2019 1409   GLUCOSEU NEGATIVE 07/07/2019 1409   HGBUR NEGATIVE 07/07/2019 1409   BILIRUBINUR NEGATIVE 07/07/2019 1409   KETONESUR NEGATIVE 07/07/2019 1409   PROTEINUR 100 (A) 07/07/2019 1409   UROBILINOGEN 0.2 10/07/2011 1203   NITRITE NEGATIVE 07/07/2019 1409   LEUKOCYTESUR SMALL (A) 07/07/2019 1409    Recent Results (from the past 240 hour(s))  Blood Culture (routine x 2)     Status: None   Collection Time: 07/07/19 10:57 AM   Specimen: BLOOD RIGHT ARM  Result Value Ref Range Status   Specimen Description BLOOD RIGHT ARM  Final   Special Requests   Final    BOTTLES DRAWN AEROBIC AND ANAEROBIC Blood Culture adequate volume   Culture   Final    NO GROWTH 5 DAYS Performed at [facility], [place] [address] [place], [place] [address]    Report Status 07/12/2019 FINAL  Final  Blood Culture (routine x 2)     Status: None   Collection Time: 07/07/19 11:49 AM   Specimen: BLOOD RIGHT FOREARM  Result Value Ref Range Status   Specimen Description BLOOD RIGHT FOREARM  Final   Special Requests    Final    BOTTLES DRAWN AEROBIC ONLY Blood Culture adequate volume   Culture   Final    NO GROWTH 5 DAYS Performed at [facility], [place] [address], [place], [place] [address]    Report Status 07/12/2019 FINAL  Final  Respiratory Panel by RT PCR (Flu A&B, Covid) - Nasopharyngeal Swab     Status: None   Collection Time: 07/07/19  1:47 PM   Specimen: Nasopharyngeal Swab  Result Value Ref Range Status  SARS Coronavirus 2 by RT PCR NEGATIVE NEGATIVE Final    Comment: (NOTE) SARS-CoV-2 target nucleic acids are NOT DETECTED. The SARS-CoV-2 RNA is generally detectable in upper respiratoy specimens during the acute phase of infection. The lowest concentration of SARS-CoV-2 viral copies this assay can detect is 131 copies/mL. A negative result does not preclude SARS-Cov-2 infection and should not be used as the sole basis for treatment or other patient management decisions. A negative result may occur with  improper specimen collection/handling, submission of specimen other than nasopharyngeal swab, presence of viral mutation(s) within the areas targeted by this assay, and inadequate number of viral copies (<131 copies/mL). A negative result must be combined with clinical observations, patient history, and epidemiological information. The expected result is Negative. Fact Sheet for Patients:  PinkCheek.be Fact Sheet for Healthcare Providers:  GravelBags.it This test is not yet ap proved or cleared by the [place] FDA and  has been authorized for detection and/or diagnosis of SARS-CoV-2 by FDA under an Emergency Use Authorization (EUA). This EUA will remain  in effect (meaning this test can be used) for the duration of the COVID-19 declaration under Section 564(b)(1) of the Act, 21 U.S.C. section 360bbb-3(b)(1), unless the authorization is terminated or revoked sooner.    Influenza A by PCR NEGATIVE NEGATIVE Final    Influenza B by PCR NEGATIVE NEGATIVE Final    Comment: (NOTE) The Xpert Xpress SARS-CoV-2/FLU/RSV assay is intended as an aid in  the diagnosis of influenza from Nasopharyngeal swab specimens and  should not be used as a sole basis for treatment. Nasal washings and  aspirates are unacceptable for Xpert Xpress SARS-CoV-2/FLU/RSV  testing. Fact Sheet for Patients: PinkCheek.be Fact Sheet for Healthcare Providers: GravelBags.it This test is not yet approved or cleared by the [place] FDA and  has been authorized for detection and/or diagnosis of SARS-CoV-2 by  FDA under an Emergency Use Authorization (EUA). This EUA will remain  in effect (meaning this test can be used) for the duration of the  Covid-19 declaration under Section 564(b)(1) of the Act, 21  U.S.C. section 360bbb-3(b)(1), unless the authorization is  terminated or revoked. Performed at [facility], [place] [address] [place], [place] [address]   Respiratory Panel by PCR     Status: None   Collection Time: 07/07/19  1:47 PM   Specimen: Nasopharyngeal Swab; Respiratory  Result Value Ref Range Status   Adenovirus NOT DETECTED NOT DETECTED Final   Coronavirus 229E NOT DETECTED NOT DETECTED Final    Comment: (NOTE) The Coronavirus on the Respiratory Panel, DOES NOT test for the novel  Coronavirus (2019 nCoV)    Coronavirus HKU1 NOT DETECTED NOT DETECTED Final   Coronavirus NL63 NOT DETECTED NOT DETECTED Final   Coronavirus OC43 NOT DETECTED NOT DETECTED Final   Metapneumovirus NOT DETECTED NOT DETECTED Final   Rhinovirus / Enterovirus NOT DETECTED NOT DETECTED Final   Influenza A NOT DETECTED NOT DETECTED Final   Influenza B NOT DETECTED NOT DETECTED Final   Parainfluenza Virus 1 NOT DETECTED NOT DETECTED Final   Parainfluenza Virus 2 NOT DETECTED NOT DETECTED Final   Parainfluenza Virus 3 NOT DETECTED NOT DETECTED Final   Parainfluenza Virus 4 NOT  DETECTED NOT DETECTED Final   Respiratory Syncytial Virus NOT DETECTED NOT DETECTED Final   Bordetella pertussis NOT DETECTED NOT DETECTED Final   Chlamydophila pneumoniae NOT DETECTED NOT DETECTED Final   Mycoplasma pneumoniae NOT DETECTED NOT DETECTED Final    Comment: Performed at [facility], [place] [address], [place], [place] [address]  Urine culture     Status: Abnormal   Collection Time: 07/07/19  2:13 PM   Specimen: Urine, Random  Result Value Ref Range Status   Specimen Description URINE, RANDOM  Final   Special Requests   Final    NONE Performed at [facility], [place] [address] [place], [place] [address]    Culture >=100,000 COLONIES/mL ESCHERICHIA COLI (A)  Final   Report Status 07/09/2019 FINAL  Final   Organism ID, Bacteria ESCHERICHIA COLI (A)  Final      Susceptibility   Escherichia coli - MIC*    AMPICILLIN 8 SENSITIVE Sensitive     CEFAZOLIN <=4 SENSITIVE Sensitive     CEFTRIAXONE <=1 SENSITIVE Sensitive     CIPROFLOXACIN <=0.25 SENSITIVE Sensitive     GENTAMICIN <=1 SENSITIVE Sensitive     IMIPENEM <=0.25 SENSITIVE Sensitive     NITROFURANTOIN <=16 SENSITIVE Sensitive     TRIMETH/SULFA <=20 SENSITIVE Sensitive     AMPICILLIN/SULBACTAM 4 SENSITIVE Sensitive     PIP/TAZO <=4 SENSITIVE Sensitive     * >=100,000 COLONIES/mL ESCHERICHIA COLI  Expectorated sputum assessment w rflx to resp cult     Status: None   Collection Time: 07/09/19  1:26 AM   Specimen: SPU  Result Value Ref Range Status   Specimen Description SPUTUM  Final   Special Requests NONE  Final   Sputum evaluation   Final    THIS SPECIMEN IS ACCEPTABLE FOR SPUTUM CULTURE Performed at [facility], [address], [place], [place] [address]    Report Status 07/09/2019 FINAL  Final  Culture, respiratory     Status: None   Collection Time: 07/09/19  1:26 AM   Specimen: SPU  Result Value Ref Range Status   Specimen Description SPUTUM  Final   Special Requests NONE Reflexed from  [id number]  Final   Gram Stain   Final    MODERATE WBC PRESENT, PREDOMINANTLY PMN FEW GRAM POSITIVE COCCI IN CHAINS FEW GRAM NEGATIVE RODS    Culture   Final    Consistent with normal respiratory flora. Performed at [facility], [place] [address] [place], [place] [address]    Report Status 07/11/2019 FINAL  Final  MRSA PCR Screening     Status: None   Collection Time: 07/11/19  6:35 AM   Specimen: Nasal Mucosa; Nasopharyngeal  Result Value Ref Range Status   MRSA by PCR NEGATIVE NEGATIVE Final    Comment:        The GeneXpert MRSA Assay (FDA approved for NASAL specimens only), is one component of a comprehensive MRSA colonization surveillance program. It is not intended to diagnose MRSA infection nor to guide or monitor treatment for MRSA infections. Performed at [facility], [place] [address], [place], [place] [address]   Culture, blood (routine x 2)     Status: None (Preliminary result)   Collection Time: 07/11/19  7:30 AM   Specimen: BLOOD RIGHT HAND  Result Value Ref Range Status   Specimen Description BLOOD RIGHT HAND  Final   Special Requests   Final    BOTTLES DRAWN AEROBIC ONLY Blood Culture results may not be optimal due to an inadequate volume of blood received in culture bottles   Culture   Final    NO GROWTH 3 DAYS Performed at [facility], [place] [address] [place], [place] [address]    Report Status PENDING  Incomplete  Culture, blood (routine x 2)     Status: None (Preliminary result)   Collection Time: 07/11/19  7:35 AM   Specimen: BLOOD RIGHT HAND  Result Value Ref Range Status   Specimen Description BLOOD RIGHT HAND  Final   Special Requests   Final    BOTTLES DRAWN AEROBIC ONLY Blood Culture results may not be optimal due to an inadequate volume of blood received in culture bottles   Culture   Final    NO GROWTH 3 DAYS Performed at [facility], [place] [address], [place], [place] [address]    Report Status PENDING  Incomplete    Expectorated sputum assessment w rflx to resp cult     Status: None   Collection Time: 07/11/19  9:19 AM   Specimen: Sputum  Result Value Ref Range Status   Specimen Description SPUTUM  Final   Special Requests Immunocompromised  Final   Sputum evaluation   Final    THIS SPECIMEN IS ACCEPTABLE FOR SPUTUM CULTURE Performed at [facility], [place] [address], [place], [place] [address]    Report Status 07/11/2019 FINAL  Final  Culture, respiratory     Status: None   Collection Time: 07/11/19  9:19 AM   Specimen: SPU  Result Value Ref Range Status   Specimen Description SPUTUM  Final   Special Requests Immunocompromised Reflexed from [id number]  Final   Gram Stain   Final    RARE WBC PRESENT, PREDOMINANTLY PMN FEW GRAM POSITIVE COCCI IN CLUSTERS RARE YEAST Performed at [facility], [place] [address] [place], [place] [address]    Culture ABUNDANT ENTEROCOCCUS FAECALIS  Final   Report Status 07/13/2019 FINAL  Final   Organism ID, Bacteria ENTEROCOCCUS FAECALIS  Final      Susceptibility   Enterococcus faecalis - MIC*    AMPICILLIN <=2 SENSITIVE Sensitive     VANCOMYCIN 2 SENSITIVE Sensitive     GENTAMICIN SYNERGY SENSITIVE Sensitive     * ABUNDANT ENTEROCOCCUS FAECALIS      Studies: No results found.  Scheduled Meds: . sodium chloride   Intravenous Once  . aspirin EC  81 mg Oral QHS  . atorvastatin  20 mg Oral Daily  . carvedilol  25 mg Oral BID WC  . Chlorhexidine Gluconate Cloth  6 each Topical Q0600  . cloNIDine  0.1 mg Oral BID  . [START ON 07/16/2019] darbepoetin (ARANESP) injection - DIALYSIS  150 mcg Intravenous Q Sat-HD  . finasteride  5 mg Oral QPM  . hydrALAZINE  100 mg Oral TID  . insulin aspart  0-9 Units Subcutaneous TID WC  . ipratropium-albuterol  3 mL Nebulization TID  . isosorbide dinitrate  30 mg Oral TID  . LORazepam  0.5 mg Intravenous Once  . multivitamin  1 tablet Oral QHS  . ranolazine  1,000 mg Oral BID  . tamsulosin  0.4 mg Oral QPC  supper  . Warfarin - Pharmacist Dosing Inpatient   Does not apply q1800    Continuous Infusions: . ampicillin-sulbactam (UNASYN) IV 3 g (07/14/19 0552)  . heparin 1,500 Units/hr (07/14/19 0242)     LOS: 7 days     Little Ishikawa, DO [facility] Pager [phone]  If 7PM-7AM, please contact night-coverage [email or web address] Password [facility] 07/14/2019, 7:29 AM

## 2019-07-14 NOTE — Progress Notes (Signed)
ANTICOAGULATION CONSULT NOTE - Follow Up Consult  Pharmacy Consult for Heparin and Warfarin Indication: atrial fibrillation  No Known Allergies  Patient Measurements: Height: 6\' 3"  (190.5 cm) Weight: 225 lb 4.8 oz (102.2 kg) IBW/kg (Calculated) : 84.5 Heparin Dosing Weight: 101 kg  Vital Signs: Temp: 98.4 F (36.9 C) (12/24 0623) Temp Source: Oral (12/24 0623) BP: 150/74 (12/24 0623) Pulse Rate: 75 (12/24 0623)  Labs: Recent Labs    07/11/19 1544 07/11/19 1802 07/12/19 0327 07/13/19 0316 07/14/19 0254  HGB  --   --  7.8* 7.2* 6.6*  HCT  --   --  22.6* 21.8* 19.6*  PLT  --   --  270 261 294  LABPROT  --   --  17.7* 20.6* 24.1*  INR  --   --  1.5* 1.8* 2.2*  HEPARINUNFRC  --   --  0.49 0.27* 0.30  CREATININE  --   --  5.40* 6.46* 6.81*  TROPONINIHS 199* 229* 280*  --   --     Assessment: 83 year old male presented 12/17 with blood tinged sputum and bilateral LLE edema. EKG suggestive of ischemia. Heparin was stopped 12/18, but patient went into atrial fibrillation and heparin was restarted. No anticoagulation prior to admission. Pharmacy asked to start warfarin on 12/20.    Heparin level this morning is at low end of goal at 0.30. INR is therapeutic at 2.2. His H&H has been decreasing, today at 6.6 requiring 2 units RBC transfusion. Platelets are wnl. Dose held yesterday due to IR temporary cath placement.    Goal of Therapy:  INR 2-3 Heparin level 0.3-0.7 units/ml Monitor platelets by anticoagulation protocol: Yes   Plan:  Stop heparin infusion  Warfarin 7.5 mg x 1 today Daily PT/INR and CBC. Monitor for signs of bleeding    Thank you,   Eddie Candle, PharmD PGY-1 Pharmacy Resident   Please check amion for clinical pharmacist contact number

## 2019-07-14 NOTE — Progress Notes (Signed)
I discussed with Renee Pain, patient's daughter this morning regarding her father's condition. I discussed with her regarding the below noted conditions 1.  Non-STEMI and CAD with congestive heart failure, pulmonary edema 2.  Acute on chronic kidney disease now stage V needing hemodialysis 3.  Community-acquired pneumonia 4.  Chronic anemia 5.  Altered cognitive function  Given his multiple medical comorbidity, slow progression and improvement in his overall health, I have recommended that he make patient DNR.  She understands the implications, agrees to DNR status.  I will continue to support her.  From cardiac standpoint advised her that he is not a candidate for any invasive procedures.  Conservative therapy will be followed. This was a 30 minute encounter discussion regarding end of life and evaluation of medical records and coordination of care.   Adrian Prows, MD, [facility] 07/14/2019, 10:17 AM [facility] Pager: [phone] Office: [phone]

## 2019-07-14 NOTE — Progress Notes (Signed)
[facility] NEPHROLOGY PROGRESS NOTE  Assessment/ Plan: Pt is a 83 y.o. yo male with history of CAD, CLL in remission, DM 2, CKD 5 follows with Dr. Posey Pronto, baseline creatinine level around 5.6, has a left AV fistula, admitted with a cough, fever and shortness of breath  #CKD stage V with fluid overload and uremia progressed to ESRD: Refractory to diuretics. Initiated HD on 12/19, second on 12/21. Had infiltration of AVF on Mon.     Have not been able to do 3rd treatment yet due to access dysfunction- planning for [facility] vs temp cath today followed by third treatment   Set up for [facility] on MWF second shift at discharge.  Needs a TDC prior to discharge as well as I think some more tuning up and to make sure his hgb is stable- he is not stable enough for OP HD at this point   #Community acquired pneumonia: On antibiotics per primary team. unasyn   #Acute hypoxia: Chest x-ray with mild pulmonary edema.  UF during HD as well.  #Anemia of CKD: Iron saturation 15%.  Ordered IV ferrlecit 250 mg x 3 doses and started ESA- inc dose for hgb in the 7's.  In the 6's this AM-  Transfusion ordered     #Metabolic acidosis: Discontinued sodium bicarbonate, on HD now.  #Hypertension:  BP/volume expect to improve with dialysis.  On hydral 100, isordil, clonidine and coreg-  Likely will be able to wean BP meds eventually , not yet   #Acute on chronic CHF, non-STEMI: Currently on IV heparin and coumadin.  Cardiology is following. Held coumadin for needed procedure  dispo-  Needs more PT/OT- plan is to return home. More confused, his BUN is not helping but also not sure something else is not going on - did get ativan last night    Subjective: confusion again overnight reported- worse today -  Spoke to daughter who says patient is usually much better mentally  -  Was unable to get HD yesterday due to access dysfunction-  Will need line but INR is 2.2-  Will probably end up being a vascath-  hgb down  today as well   Objective Vital signs in last 24 hours: Vitals:   07/14/19 0555 07/14/19 0623 07/14/19 0801 07/14/19 0814  BP:  (!) 150/74  (!) 155/66  Pulse:  75  73  Resp:  20 (!) 24 19  Temp:  98.4 F (36.9 C)  98.7 F (37.1 C)  TempSrc:  Oral  Oral  SpO2:  95%  97%  Weight: 102.2 kg     Height:       Weight change: 1.095 kg  Intake/Output Summary (Last 24 hours) at 07/14/2019 0916 Last data filed at 07/13/2019 2037 Gross per 24 hour  Intake 756.69 ml  Output 725 ml  Net 31.69 ml       Labs: Basic Metabolic Panel: Recent Labs  Lab 07/09/19 0318 07/10/19 0301 07/11/19 0319 07/12/19 0327 07/13/19 0316 07/14/19 0254  NA 135 137 134* 132* 131* 128*  K 3.9 3.8 4.1 4.1 4.3 4.5  CL 104 104 101 96* 96* 92*  CO2 17* 21* 17* 21* 18* 17*  GLUCOSE 151* 184* 155* 187* 189* 161*  BUN 103* 70* 82* 72* 105* 118*  CREATININE 5.94* 4.65* 5.38* 5.40* 6.46* 6.81*  CALCIUM 8.9 8.6* 8.9 8.9 8.7* 8.8*  PHOS 4.2 3.4 5.0*  --   --   --    Liver Function Tests: Recent Labs  Lab 07/07/19 1057 07/09/19 0318 07/10/19 0301 07/11/19 0319  AST 13*  --   --   --   ALT 14  --   --   --   ALKPHOS 61  --   --   --   BILITOT 0.6  --   --   --   PROT 6.4*  --   --   --   ALBUMIN 3.5 2.7* 2.6* 2.5*   No results for input(s): LIPASE, AMYLASE in the last 168 hours. No results for input(s): AMMONIA in the last 168 hours. CBC: Recent Labs  Lab 07/07/19 1057 07/08/19 0336 07/10/19 0301 07/11/19 0319 07/12/19 0327 07/13/19 0316 07/14/19 0254  WBC 16.8*  --  16.5* 20.7* 24.2* 21.4* 21.7*  NEUTROABS 11.3*  --   --   --   --   --   --   HGB 8.5*  --  7.8* 8.0* 7.8* 7.2* 6.6*  HCT 26.2*   < > 23.5* 24.5* 22.6* 21.8* 19.6*  MCV 99.2  --  95.5 98.4 93.0 96.5 94.7  PLT 248  --  239 253 270 261 294   < > = values in this interval not displayed.   Cardiac Enzymes: No results for input(s): CKTOTAL, CKMB, CKMBINDEX, TROPONINI in the last 168 hours. CBG: Recent Labs  Lab  07/13/19 0632 07/13/19 1324 07/13/19 1633 07/13/19 2112 07/14/19 0640  GLUCAP 182* 171* 133* 161* 156*    Iron Studies:  No results for input(s): IRON, TIBC, TRANSFERRIN, FERRITIN in the last 72 hours. Studies/Results: No results found.  Medications: Infusions: . ampicillin-sulbactam (UNASYN) IV 3 g (07/14/19 0552)  . heparin 1,500 Units/hr (07/14/19 0242)    Scheduled Medications: . sodium chloride   Intravenous Once  . aspirin EC  81 mg Oral QHS  . atorvastatin  20 mg Oral Daily  . carvedilol  25 mg Oral BID WC  . Chlorhexidine Gluconate Cloth  6 each Topical Q0600  . cloNIDine  0.1 mg Oral BID  . [START ON 07/16/2019] darbepoetin (ARANESP) injection - DIALYSIS  150 mcg Intravenous Q Sat-HD  . finasteride  5 mg Oral QPM  . hydrALAZINE  100 mg Oral TID  . insulin aspart  0-9 Units Subcutaneous TID WC  . ipratropium-albuterol  3 mL Nebulization TID  . isosorbide dinitrate  30 mg Oral TID  . LORazepam  0.5 mg Intravenous Once  . multivitamin  1 tablet Oral QHS  . ranolazine  1,000 mg Oral BID  . tamsulosin  0.4 mg Oral QPC supper  . Warfarin - Pharmacist Dosing Inpatient   Does not apply q1800    have reviewed scheduled and prn medications.  Physical Exam: General:  on 2 L of oxygen, more confused unfortunately  Heart:RRR, s1s2 nl Lungs: Bibasal crackles b/l, no wheezing Abdomen:soft, Non-tender, non-distended Extremities: Bilateral lower extremity pitting edema Dialysis Access: Left AV fistula has good thrill and bruit. Blister proximal-  Does not appear infected-  Bandage to collect exudate  Nam Vossler A Briseidy Spark 07/14/2019,9:16 AM  LOS: 7 days

## 2019-07-15 DIAGNOSIS — N186 End stage renal disease: Secondary | ICD-10-CM

## 2019-07-15 LAB — CBC
HCT: 22.6 % — ABNORMAL LOW (ref 39.0–52.0)
Hemoglobin: 7.7 g/dL — ABNORMAL LOW (ref 13.0–17.0)
MCH: 31.2 pg (ref 26.0–34.0)
MCHC: 34.1 g/dL (ref 30.0–36.0)
MCV: 91.5 fL (ref 80.0–100.0)
Platelets: 265 10*3/uL (ref 150–400)
RBC: 2.47 MIL/uL — ABNORMAL LOW (ref 4.22–5.81)
RDW: 15.7 % — ABNORMAL HIGH (ref 11.5–15.5)
WBC: 20.8 10*3/uL — ABNORMAL HIGH (ref 4.0–10.5)
nRBC: 0.1 % (ref 0.0–0.2)

## 2019-07-15 LAB — GLUCOSE, CAPILLARY
Glucose-Capillary: 112 mg/dL — ABNORMAL HIGH (ref 70–99)
Glucose-Capillary: 125 mg/dL — ABNORMAL HIGH (ref 70–99)
Glucose-Capillary: 107 mg/dL — ABNORMAL HIGH (ref 70–99)
Glucose-Capillary: 217 mg/dL — ABNORMAL HIGH (ref 70–99)

## 2019-07-15 LAB — PROTIME-INR
INR: 2.7 — ABNORMAL HIGH (ref 0.8–1.2)
Prothrombin Time: 28.3 seconds — ABNORMAL HIGH (ref 11.4–15.2)

## 2019-07-15 LAB — HEPARIN LEVEL (UNFRACTIONATED): Heparin Unfractionated: <0.1 IU/mL — ABNORMAL LOW (ref 0.30–0.70)

## 2019-07-15 MED ORDER — CHLORHEXIDINE GLUCONATE CLOTH 2 % EX PADS
6.0000 | MEDICATED_PAD | Freq: Every day | CUTANEOUS | Status: DC
  Administered 2019-07-15 – 2019-07-26 (×9): 6 via TOPICAL

## 2019-07-15 MED ORDER — WARFARIN SODIUM 3 MG PO TABS
3.0000 mg | ORAL_TABLET | Freq: Once | ORAL | Status: AC
  Administered 2019-07-15: 3 mg via ORAL
  Filled 2019-07-15: qty 1

## 2019-07-15 MED ORDER — IPRATROPIUM-ALBUTEROL 0.5-2.5 (3) MG/3ML IN SOLN
3.0000 mL | Freq: Two times a day (BID) | RESPIRATORY_TRACT | Status: DC
  Administered 2019-07-15 – 2019-07-21 (×8): 3 mL via RESPIRATORY_TRACT
  Filled 2019-07-15 (×10): qty 3

## 2019-07-15 NOTE — Progress Notes (Signed)
PROGRESS NOTE  Ray Merritt CBJ:[id number] DOB: 08-27-1935 DOA: 07/07/2019 PCP: [name], MD  HPI/Recap of past 66 hours: 83 year old gentleman with history of hypertension, chronic kidney disease stage IV nearing hemodialysis, coronary artery disease, recent abnormal stress test, type 2 diabetes and CLL presented to the emergency room with shortness of breath, cough with blood-tinged sputum since morning.  He had some atypical chest pain.  Recently with weight gain and extremity edema.  He does have left AV arm AV fistula that has not been used yet. In the emergency room, febrile with temperature 100.6, blood pressures elevated, 2 L nasal cannula placed for comfort.  WBC 16.8.  Creatinine 6.61.  EKG revealed ST depression in inferior lateral leads.  Mildly elevated troponins.  Chest x-ray with fluid in the fissures, suspected atelectasis versus pneumonia.COVID-19 point-of-care and PCR test negative.    Admitted for IV antibiotics and heparin infusion with cardiology and nephrology consult.  Acute blood loss anemia requiring 1 unit irradiated RBC 12/19.  Progressing to ESRD first HD on 07/09/2019, then 07/11/19.  Nephrology has outpatient HD spot - can DC as early as 12/26 after dialysis that day. Continues to have issues with dialysis access which may delay disposition.  Subjective: Significant events overnight as discussed with staff, patient denies any complaints   Assessment/Plan: Principal Problem:   Sepsis ([place]) Active Problems:   CLL (chronic lymphocytic leukemia) (HCC)   Type 2 diabetes, HbA1c goal < 7% (HCC)   Acute renal failure superimposed on chronic kidney disease (HCC)   Acute on chronic combined systolic and diastolic CHF (congestive heart failure) (HCC)   Hypertensive urgency   Urinary tract infection   Abnormal EKG   Elevated troponin  Sepsis secondary to Enterococcous Faecalis community-acquired pneumonia, POA Concurrent E. coli UTI, likely POA -Urine  culture grew greater than 100,000 colonies of E. coli, pansensitive. -Sputum culture grew abundant enterococous faecalis -Azithromycin/ceftriaxone discontinued, transition to Unasyn on 07/12/2019 -Tentative stop date 07/16/19 for Unasyn unless recurrent fever/worsening clinical course -Chest x-ray personally reviewed -right middle lobe opacification, not noted on x-ray 07/10/2019 -Without fever since 17th -Blood cultures continue to be preliminarily negative  New ESRD on HD First HD on 07/09/2019 Next HD completed on 07/11/2019  HD canceled on 07/12/2019 due to swelling and blister AV fistula.   Unable to access for dialysis 07/13/2019 Right internal jugular hemodialysis catheter placed 07/14/2019 successfully Patient has an outpatient HD spot arranged by nephrology, will need dialysis at our facility on 07/16/2019 in the morning.  Anemia of chronic disease complicated by iron deficiency, ongoing Received 1 unit of irradiated RBC on 07/09/2019 2 additional units PRBC transfused 07/14/19 (Hgb 6.6 am 24th) No sign of overt bleeding Nephrology managing with ESA and iron repletion.  Worsening hypervolemic hyponatremia Management with dialysis  Volume overload in the setting of new ESRD Management Per nephrology/dialysis, improving with hemodialysis  Elevated troponin likely demand ischemia in the setting of hypoxia Troponin peaked at 280 Denies anginal symptoms at the time of this exam.  NSTEMI Followed by cardiology On aspirin and Lipitor Continue cardiac medications as recommended by cardiology.  Paroxysmal A. fib CHA2DS2-VASc score 6 Anticoagulate with Coumadin per cardiology Continue warfarin, managed by pharmacy  Subtherapeutic INR, resolving Management per above Goal INR between 2 and 3: per discussion with cardiology can keep patient closer to 2 given bleeding issues as above with dialysis Daily INR Lab Results  Component Value Date   INR 2.7 (H) 07/15/2019   INR  2.2 (H) 07/14/2019  INR 1.8 (H) 07/13/2019    BPH Stable Continue home medications Continue to monitor urine output  Acute on chronic combined systolic and diastolic CHF Continue strict I's and O's and daily weight Continue cardiac medications Volume managed by hemodialysis and IV Lasix as needed for volume overload Received 60 mg IV Lasix 1 today  Essential hypertension Likely will need to reduce doses of antihypertensives Continue to closely monitor vital signs Continue clonidine at lower dose 0.1 mg twice daily Hold off Coreg and hydralazine to give room for diuresing as needed before hemodialysis  CLL Will need to follow-up outpatient  Physical debility, ambulatory dysfunction PT OT to assess Continue PT OT with assistance and fall precautions.  DVT prophylaxis:  Coumadin Code Status:  DNR confirmed at bedside 07/14/2019 Family Communication: None  Disposition Plan: Pending clinical improvement, nephrology and cardiology signing off.  Consultants:   Cardiology, Dr. Einar Gip  Nephrology, Dr. Moshe Cipro  Interventional radiology  Procedures:   None  Antimicrobials:   Rocephin, azithromycin, 07/07/2019--- 07/11/19  Unasyn 07/11/2019---ongoing -tentative stop date 07/16/2019  Objective: Vitals:   07/15/19 0718 07/15/19 0826 07/15/19 1000 07/15/19 1110  BP:  (!) 158/44  (!) 136/40  Pulse:  75  63  Resp:  15 15 17   Temp:  99.4 F (37.4 C)  98.5 F (36.9 C)  TempSrc:  Oral  Oral  SpO2: 99% 100% 100% 99%  Weight:      Height:        Intake/Output Summary (Last 24 hours) at 07/15/2019 1352 Last data filed at 07/15/2019 0409 Gross per 24 hour  Intake 766 ml  Output 2875 ml  Net -2109 ml   Filed Weights   07/14/19 1820 07/14/19 2130 07/15/19 0407  Weight: 103.5 kg 101 kg 99 kg    Exam:  Awake Alert,  frail, chronically ill-appearing Symmetrical Chest wall movement, Good air movement bilaterally, CTAB RRR,No Gallops,Rubs or new Murmurs, No  Parasternal Heave +ve B.Sounds, Abd Soft, No tenderness, No rebound - guarding or rigidity. No Cyanosis, Clubbing or edema, No new Rash or bruise     Data Reviewed: CBC: Recent Labs  Lab 07/11/19 0319 07/12/19 0327 07/13/19 0316 07/14/19 0254 07/15/19 0446  WBC 20.7* 24.2* 21.4* 21.7* 20.8*  HGB 8.0* 7.8* 7.2* 6.6* 7.7*  HCT 24.5* 22.6* 21.8* 19.6* 22.6*  MCV 98.4 93.0 96.5 94.7 91.5  PLT 253 270 261 294 387   Basic Metabolic Panel: Recent Labs  Lab 07/09/19 0318 07/10/19 0301 07/11/19 0319 07/12/19 0327 07/13/19 0316 07/14/19 0254  NA 135 137 134* 132* 131* 128*  K 3.9 3.8 4.1 4.1 4.3 4.5  CL 104 104 101 96* 96* 92*  CO2 17* 21* 17* 21* 18* 17*  GLUCOSE 151* 184* 155* 187* 189* 161*  BUN 103* 70* 82* 72* 105* 118*  CREATININE 5.94* 4.65* 5.38* 5.40* 6.46* 6.81*  CALCIUM 8.9 8.6* 8.9 8.9 8.7* 8.8*  PHOS 4.2 3.4 5.0*  --   --   --    GFR: Estimated Creatinine Clearance: 10.2 mL/min (A) (by C-G formula based on SCr of 6.81 mg/dL (H)). Liver Function Tests: Recent Labs  Lab 07/09/19 0318 07/10/19 0301 07/11/19 0319  ALBUMIN 2.7* 2.6* 2.5*   No results for input(s): LIPASE, AMYLASE in the last 168 hours. No results for input(s): AMMONIA in the last 168 hours. Coagulation Profile: Recent Labs  Lab 07/11/19 0319 07/12/19 0327 07/13/19 0316 07/14/19 0254 07/15/19 0446  INR 1.2 1.5* 1.8* 2.2* 2.7*   Cardiac Enzymes: No results for input(s):  CKTOTAL, CKMB, CKMBINDEX, TROPONINI in the last 168 hours. BNP (last 3 results) No results for input(s): PROBNP in the last 8760 hours. HbA1C: No results for input(s): HGBA1C in the last 72 hours. CBG: Recent Labs  Lab 07/14/19 0640 07/14/19 1334 07/14/19 1618 07/15/19 0615 07/15/19 1108  GLUCAP 156* 126* 130* 112* 125*   Lipid Profile: No results for input(s): CHOL, HDL, LDLCALC, TRIG, CHOLHDL, LDLDIRECT in the last 72 hours. Thyroid Function Tests: No results for input(s): TSH, T4TOTAL, FREET4, T3FREE,  THYROIDAB in the last 72 hours. Anemia Panel: No results for input(s): VITAMINB12, FOLATE, FERRITIN, TIBC, IRON, RETICCTPCT in the last 72 hours. Urine analysis:    Component Value Date/Time   COLORURINE YELLOW 07/07/2019 1409   APPEARANCEUR HAZY (A) 07/07/2019 1409   LABSPEC 1.014 07/07/2019 1409   PHURINE 5.0 07/07/2019 1409   GLUCOSEU NEGATIVE 07/07/2019 1409   HGBUR NEGATIVE 07/07/2019 1409   BILIRUBINUR NEGATIVE 07/07/2019 1409   KETONESUR NEGATIVE 07/07/2019 1409   PROTEINUR 100 (A) 07/07/2019 1409   UROBILINOGEN 0.2 10/07/2011 1203   NITRITE NEGATIVE 07/07/2019 1409   LEUKOCYTESUR SMALL (A) 07/07/2019 1409    Recent Results (from the past 240 hour(s))  Blood Culture (routine x 2)     Status: None   Collection Time: 07/07/19 10:57 AM   Specimen: BLOOD RIGHT ARM  Result Value Ref Range Status   Specimen Description BLOOD RIGHT ARM  Final   Special Requests   Final    BOTTLES DRAWN AEROBIC AND ANAEROBIC Blood Culture adequate volume   Culture   Final    NO GROWTH 5 DAYS Performed at [facility], [place] [address] [place], [place] [address]    Report Status 07/12/2019 FINAL  Final  Blood Culture (routine x 2)     Status: None   Collection Time: 07/07/19 11:49 AM   Specimen: BLOOD RIGHT FOREARM  Result Value Ref Range Status   Specimen Description BLOOD RIGHT FOREARM  Final   Special Requests   Final    BOTTLES DRAWN AEROBIC ONLY Blood Culture adequate volume   Culture   Final    NO GROWTH 5 DAYS Performed at [facility], [place] [address] [place], [place] [address]    Report Status 07/12/2019 FINAL  Final  Respiratory Panel by RT PCR (Flu A&B, Covid) - Nasopharyngeal Swab     Status: None   Collection Time: 07/07/19  1:47 PM   Specimen: Nasopharyngeal Swab  Result Value Ref Range Status   SARS Coronavirus 2 by RT PCR NEGATIVE NEGATIVE Final    Comment: (NOTE) SARS-CoV-2 target nucleic acids are NOT DETECTED. The SARS-CoV-2 RNA is generally detectable  in upper respiratoy specimens during the acute phase of infection. The lowest concentration of SARS-CoV-2 viral copies this assay can detect is 131 copies/mL. A negative result does not preclude SARS-Cov-2 infection and should not be used as the sole basis for treatment or other patient management decisions. A negative result may occur with  improper specimen collection/handling, submission of specimen other than nasopharyngeal swab, presence of viral mutation(s) within the areas targeted by this assay, and inadequate number of viral copies (<131 copies/mL). A negative result must be combined with clinical observations, patient history, and epidemiological information. The expected result is Negative. Fact Sheet for Patients:  PinkCheek.be Fact Sheet for Healthcare Providers:  GravelBags.it This test is not yet ap proved or cleared by the [place] FDA and  has been authorized for detection and/or diagnosis of SARS-CoV-2 by FDA  under an Emergency Use Authorization (EUA). This EUA will remain  in effect (meaning this test can be used) for the duration of the COVID-19 declaration under Section 564(b)(1) of the Act, 21 U.S.C. section 360bbb-3(b)(1), unless the authorization is terminated or revoked sooner.    Influenza A by PCR NEGATIVE NEGATIVE Final   Influenza B by PCR NEGATIVE NEGATIVE Final    Comment: (NOTE) The Xpert Xpress SARS-CoV-2/FLU/RSV assay is intended as an aid in  the diagnosis of influenza from Nasopharyngeal swab specimens and  should not be used as a sole basis for treatment. Nasal washings and  aspirates are unacceptable for Xpert Xpress SARS-CoV-2/FLU/RSV  testing. Fact Sheet for Patients: PinkCheek.be Fact Sheet for Healthcare Providers: GravelBags.it This test is not yet approved or cleared by the [place] FDA and  has been authorized for  detection and/or diagnosis of SARS-CoV-2 by  FDA under an Emergency Use Authorization (EUA). This EUA will remain  in effect (meaning this test can be used) for the duration of the  Covid-19 declaration under Section 564(b)(1) of the Act, 21  U.S.C. section 360bbb-3(b)(1), unless the authorization is  terminated or revoked. Performed at [facility], [place] [address] [place], [place] [address]   Respiratory Panel by PCR     Status: None   Collection Time: 07/07/19  1:47 PM   Specimen: Nasopharyngeal Swab; Respiratory  Result Value Ref Range Status   Adenovirus NOT DETECTED NOT DETECTED Final   Coronavirus 229E NOT DETECTED NOT DETECTED Final    Comment: (NOTE) The Coronavirus on the Respiratory Panel, DOES NOT test for the novel  Coronavirus (2019 nCoV)    Coronavirus HKU1 NOT DETECTED NOT DETECTED Final   Coronavirus NL63 NOT DETECTED NOT DETECTED Final   Coronavirus OC43 NOT DETECTED NOT DETECTED Final   Metapneumovirus NOT DETECTED NOT DETECTED Final   Rhinovirus / Enterovirus NOT DETECTED NOT DETECTED Final   Influenza A NOT DETECTED NOT DETECTED Final   Influenza B NOT DETECTED NOT DETECTED Final   Parainfluenza Virus 1 NOT DETECTED NOT DETECTED Final   Parainfluenza Virus 2 NOT DETECTED NOT DETECTED Final   Parainfluenza Virus 3 NOT DETECTED NOT DETECTED Final   Parainfluenza Virus 4 NOT DETECTED NOT DETECTED Final   Respiratory Syncytial Virus NOT DETECTED NOT DETECTED Final   Bordetella pertussis NOT DETECTED NOT DETECTED Final   Chlamydophila pneumoniae NOT DETECTED NOT DETECTED Final   Mycoplasma pneumoniae NOT DETECTED NOT DETECTED Final    Comment: Performed at [facility], [place]. [address], [place], [place] [id number]  Urine culture     Status: Abnormal   Collection Time: 07/07/19  2:13 PM   Specimen: Urine, Random  Result Value Ref Range Status   Specimen Description URINE, RANDOM  Final   Special Requests   Final    NONE Performed at [facility], [place] [address] [place], [place] [address]    Culture >=100,000 COLONIES/mL ESCHERICHIA COLI (A)  Final   Report Status 07/09/2019 FINAL  Final   Organism ID, Bacteria ESCHERICHIA COLI (A)  Final      Susceptibility   Escherichia coli - MIC*    AMPICILLIN 8 SENSITIVE Sensitive     CEFAZOLIN <=4 SENSITIVE Sensitive     CEFTRIAXONE <=1 SENSITIVE Sensitive     CIPROFLOXACIN <=0.25 SENSITIVE Sensitive     GENTAMICIN <=1 SENSITIVE Sensitive     IMIPENEM <=0.25 SENSITIVE Sensitive     NITROFURANTOIN <=16 SENSITIVE Sensitive  TRIMETH/SULFA <=20 SENSITIVE Sensitive     AMPICILLIN/SULBACTAM 4 SENSITIVE Sensitive     PIP/TAZO <=4 SENSITIVE Sensitive     * >=100,000 COLONIES/mL ESCHERICHIA COLI  Expectorated sputum assessment w rflx to resp cult     Status: None   Collection Time: 07/09/19  1:26 AM   Specimen: SPU  Result Value Ref Range Status   Specimen Description SPUTUM  Final   Special Requests NONE  Final   Sputum evaluation   Final    THIS SPECIMEN IS ACCEPTABLE FOR SPUTUM CULTURE Performed at [facility], [place] [address] [place], [place] [address]    Report Status 07/09/2019 FINAL  Final  Culture, respiratory     Status: None   Collection Time: 07/09/19  1:26 AM   Specimen: SPU  Result Value Ref Range Status   Specimen Description SPUTUM  Final   Special Requests NONE Reflexed from [id number]  Final   Gram Stain   Final    MODERATE WBC PRESENT, PREDOMINANTLY PMN FEW GRAM POSITIVE COCCI IN CHAINS FEW GRAM NEGATIVE RODS    Culture   Final    Consistent with normal respiratory flora. Performed at [facility], [place] [address] [place], [place] [address]    Report Status 07/11/2019 FINAL  Final  MRSA PCR Screening     Status: None   Collection Time: 07/11/19  6:35 AM   Specimen: Nasal Mucosa; Nasopharyngeal  Result Value Ref Range Status   MRSA by PCR NEGATIVE NEGATIVE Final    Comment:        The GeneXpert MRSA Assay (FDA approved for NASAL  specimens only), is one component of a comprehensive MRSA colonization surveillance program. It is not intended to diagnose MRSA infection nor to guide or monitor treatment for MRSA infections. Performed at [facility], [place] [address] [place], [place] [address]   Culture, blood (routine x 2)     Status: None (Preliminary result)   Collection Time: 07/11/19  7:30 AM   Specimen: BLOOD RIGHT HAND  Result Value Ref Range Status   Specimen Description BLOOD RIGHT HAND  Final   Special Requests   Final    BOTTLES DRAWN AEROBIC ONLY Blood Culture results may not be optimal due to an inadequate volume of blood received in culture bottles   Culture   Final    NO GROWTH 4 DAYS Performed at [facility], [place] [address], [place], [place] [address]    Report Status PENDING  Incomplete  Culture, blood (routine x 2)     Status: None (Preliminary result)   Collection Time: 07/11/19  7:35 AM   Specimen: BLOOD RIGHT HAND  Result Value Ref Range Status   Specimen Description BLOOD RIGHT HAND  Final   Special Requests   Final    BOTTLES DRAWN AEROBIC ONLY Blood Culture results may not be optimal due to an inadequate volume of blood received in culture bottles   Culture   Final    NO GROWTH 4 DAYS Performed at [facility], [place] [address], [place], [place] [address]    Report Status PENDING  Incomplete  Expectorated sputum assessment w rflx to resp cult     Status: None   Collection Time: 07/11/19  9:19 AM   Specimen: Sputum  Result Value Ref Range Status   Specimen Description SPUTUM  Final   Special Requests Immunocompromised  Final   Sputum evaluation   Final    THIS SPECIMEN IS  ACCEPTABLE FOR SPUTUM CULTURE Performed at [facility], [place] [address], [place], [place] [address]    Report Status 07/11/2019 FINAL  Final  Culture, respiratory     Status: None   Collection Time: 07/11/19  9:19 AM   Specimen: SPU  Result Value Ref Range Status   Specimen Description  SPUTUM  Final   Special Requests Immunocompromised Reflexed from [id number]  Final   Gram Stain   Final    RARE WBC PRESENT, PREDOMINANTLY PMN FEW GRAM POSITIVE COCCI IN CLUSTERS RARE YEAST Performed at [facility], [place] [address] [place], [place] [address]    Culture ABUNDANT ENTEROCOCCUS FAECALIS  Final   Report Status 07/13/2019 FINAL  Final   Organism ID, Bacteria ENTEROCOCCUS FAECALIS  Final      Susceptibility   Enterococcus faecalis - MIC*    AMPICILLIN <=2 SENSITIVE Sensitive     VANCOMYCIN 2 SENSITIVE Sensitive     GENTAMICIN SYNERGY SENSITIVE Sensitive     * ABUNDANT ENTEROCOCCUS FAECALIS      Studies: No results found.  Scheduled Meds: . sodium chloride   Intravenous Once  . aspirin EC  81 mg Oral QHS  . atorvastatin  20 mg Oral Daily  . carvedilol  25 mg Oral BID WC  . Chlorhexidine Gluconate Cloth  6 each Topical Q0600  . cloNIDine  0.1 mg Oral BID  . [START ON 07/16/2019] darbepoetin (ARANESP) injection - DIALYSIS  150 mcg Intravenous Q Sat-HD  . finasteride  5 mg Oral QPM  . hydrALAZINE  100 mg Oral TID  . insulin aspart  0-9 Units Subcutaneous TID WC  . ipratropium-albuterol  3 mL Nebulization BID  . isosorbide dinitrate  30 mg Oral TID  . LORazepam  0.5 mg Intravenous Once  . multivitamin  1 tablet Oral QHS  . ranolazine  1,000 mg Oral BID  . tamsulosin  0.4 mg Oral QPC supper  . warfarin  3 mg Oral ONCE-1800  . Warfarin - Pharmacist Dosing Inpatient   Does not apply q1800    Continuous Infusions: . ampicillin-sulbactam (UNASYN) IV 3 g (07/15/19 0620)     LOS: 8 days     Phillips Climes, MD [facility] Pager [phone]  If 7PM-7AM, please contact night-coverage [email or web address] Password [email or web address] 07/15/2019, 1:52 PM

## 2019-07-15 NOTE — Progress Notes (Signed)
[facility] PROGRESS NOTE  Assessment/ Plan: Pt is a 83 y.o. yo male with history of CAD, CLL in remission, DM 2, CKD 5 follows with Dr. Posey Pronto, baseline creatinine level around 5.6, has a left AV fistula, admitted with a cough, fever and shortness of breath  #CKD stage V with fluid overload and uremia progressed to ESRD: Refractory to diuretics. Initiated HD on 12/19, second on 12/21. Had infiltration of AVF on Mon.      3rd treatment delayed due to access dysfunction- [facility] and  third treatment on 12/24- will plan for 4th treatment on 12/26- tomorrow    Set up for [facility] on MWF second shift at discharge.  Needs a TDC prior to discharge as well as I think some more tuning up and to make sure his hgb is stable- he is not stable enough for OP HD at this point   #Community acquired pneumonia: On antibiotics per primary team. unasyn - should cover his arm as well   #Acute hypoxia: Chest x-ray with mild pulmonary edema.  UF during HD as well.  #Anemia of CKD: Iron saturation 15%.  Ordered IV ferrlecit 250 mg x 3 doses and started ESA- inc dose for hgb in the 7's.  Has received transfusions this hosp     #Metabolic acidosis: Discontinued sodium bicarbonate, on HD now.  #Hypertension:  BP/volume expect to improve with dialysis.  On hydral 100, isordil, clonidine and coreg-  Likely will be able to wean BP meds eventually , not yet.  Hyponatremia indicates volume overload   #Acute on chronic CHF, non-STEMI: Currently on IV heparin and coumadin.  Cardiology is following. Held coumadin for needed procedure  dispo-  Needs more PT/OT- plan is to return home. More confused, his BUN is not helping but also not sure something else is not going on- per primary  - cont to monitor    Subjective:  Finally got TDC and third HD treatment last night-  removed 2600- tolerated well.  Very somnolent this AM - is different    Objective Vital signs in last 24 hours: Vitals:   07/15/19  0151 07/15/19 0407 07/15/19 0441 07/15/19 0718  BP: (!) 155/54 (!) 166/51 (!) 134/39   Pulse: 80 71 70   Resp: (!) 22 15 18    Temp: 99 F (37.2 C) 99.6 F (37.6 C)    TempSrc: Oral Axillary    SpO2: 94% 97% 97% 99%  Weight:  99 kg    Height:       Weight change: 1.305 kg  Intake/Output Summary (Last 24 hours) at 07/15/2019 0812 Last data filed at 07/15/2019 0409 Gross per 24 hour  Intake 766 ml  Output 2875 ml  Net -2109 ml       Labs: Basic Metabolic Panel: Recent Labs  Lab 07/09/19 0318 07/10/19 0301 07/11/19 0319 07/12/19 0327 07/13/19 0316 07/14/19 0254  NA 135 137 134* 132* 131* 128*  K 3.9 3.8 4.1 4.1 4.3 4.5  CL 104 104 101 96* 96* 92*  CO2 17* 21* 17* 21* 18* 17*  GLUCOSE 151* 184* 155* 187* 189* 161*  BUN 103* 70* 82* 72* 105* 118*  CREATININE 5.94* 4.65* 5.38* 5.40* 6.46* 6.81*  CALCIUM 8.9 8.6* 8.9 8.9 8.7* 8.8*  PHOS 4.2 3.4 5.0*  --   --   --    Liver Function Tests: Recent Labs  Lab 07/09/19 0318 07/10/19 0301 07/11/19 0319  ALBUMIN 2.7* 2.6* 2.5*   No results for input(s): LIPASE,  AMYLASE in the last 168 hours. No results for input(s): AMMONIA in the last 168 hours. CBC: Recent Labs  Lab 07/11/19 0319 07/12/19 0327 07/13/19 0316 07/14/19 0254 07/15/19 0446  WBC 20.7* 24.2* 21.4* 21.7* 20.8*  HGB 8.0* 7.8* 7.2* 6.6* 7.7*  HCT 24.5* 22.6* 21.8* 19.6* 22.6*  MCV 98.4 93.0 96.5 94.7 91.5  PLT 253 270 261 294 265   Cardiac Enzymes: No results for input(s): CKTOTAL, CKMB, CKMBINDEX, TROPONINI in the last 168 hours. CBG: Recent Labs  Lab 07/13/19 2112 07/14/19 0640 07/14/19 1334 07/14/19 1618 07/15/19 0615  GLUCAP 161* 156* 126* 130* 112*    Iron Studies:  No results for input(s): IRON, TIBC, TRANSFERRIN, FERRITIN in the last 72 hours. Studies/Results: IR Fluoro Guide CV Line Right  Result Date: 07/14/2019 CLINICAL DATA:  Renal failure, needs access for hemodialysis. On Coumadin. EXAM: EXAM RIGHT IJ CATHETER PLACEMENT  UNDER ULTRASOUND AND FLUOROSCOPIC GUIDANCE TECHNIQUE: The procedure, risks (including but not limited to bleeding, infection, organ damage, pneumothorax), benefits, and alternatives were explained to the family. Questions regarding the procedure were encouraged and answered. The family understands and consents to the procedure. Patency of the right IJ vein was confirmed with ultrasound with image documentation. An appropriate skin site was determined. Skin site was marked. Region was prepped using maximum barrier technique including cap and mask, sterile gown, sterile gloves, large sterile sheet, and Chlorhexidine as cutaneous antisepsis. The region was infiltrated locally with 1% lidocaine. Under real-time ultrasound guidance, the right IJ vein was accessed with a 21 gauge needle; the needle tip within the vein was confirmed with ultrasound image documentation. The needle exchanged over a 018 guidewire for vascular dilator which allowed advancement of a 20 cm Mahurkar catheter. This was positioned with the tip at the cavoatrial junction. Spot chest radiograph shows good positioning and no pneumothorax. Catheter was flushed and sutured externally with 0-Prolene sutures. Patient tolerated the procedure well. FLUOROSCOPY TIME:  0.2 minute; 23 uGym2 DAP COMPLICATIONS: COMPLICATIONS none IMPRESSION: 1. Technically successful right IJ Mahurkar catheter placement. Electronically Signed   By: Lucrezia Europe M.D.   On: 07/14/2019 13:09   IR US Guide Vasc Access Right  Result Date: 07/14/2019 CLINICAL DATA:  Renal failure, needs access for hemodialysis. On Coumadin. EXAM: EXAM RIGHT IJ CATHETER PLACEMENT UNDER ULTRASOUND AND FLUOROSCOPIC GUIDANCE TECHNIQUE: The procedure, risks (including but not limited to bleeding, infection, organ damage, pneumothorax), benefits, and alternatives were explained to the family. Questions regarding the procedure were encouraged and answered. The family understands and consents to the  procedure. Patency of the right IJ vein was confirmed with ultrasound with image documentation. An appropriate skin site was determined. Skin site was marked. Region was prepped using maximum barrier technique including cap and mask, sterile gown, sterile gloves, large sterile sheet, and Chlorhexidine as cutaneous antisepsis. The region was infiltrated locally with 1% lidocaine. Under real-time ultrasound guidance, the right IJ vein was accessed with a 21 gauge needle; the needle tip within the vein was confirmed with ultrasound image documentation. The needle exchanged over a 018 guidewire for vascular dilator which allowed advancement of a 20 cm Mahurkar catheter. This was positioned with the tip at the cavoatrial junction. Spot chest radiograph shows good positioning and no pneumothorax. Catheter was flushed and sutured externally with 0-Prolene sutures. Patient tolerated the procedure well. FLUOROSCOPY TIME:  0.2 minute; 23 uGym2 DAP COMPLICATIONS: COMPLICATIONS none IMPRESSION: 1. Technically successful right IJ Mahurkar catheter placement. Electronically Signed   By: Eden Emms.D.  On: 07/14/2019 13:09    Medications: Infusions: . ampicillin-sulbactam (UNASYN) IV 3 g (07/15/19 3419)    Scheduled Medications: . sodium chloride   Intravenous Once  . aspirin EC  81 mg Oral QHS  . atorvastatin  20 mg Oral Daily  . carvedilol  25 mg Oral BID WC  . Chlorhexidine Gluconate Cloth  6 each Topical Q0600  . cloNIDine  0.1 mg Oral BID  . [START ON 07/16/2019] darbepoetin (ARANESP) injection - DIALYSIS  150 mcg Intravenous Q Sat-HD  . finasteride  5 mg Oral QPM  . hydrALAZINE  100 mg Oral TID  . insulin aspart  0-9 Units Subcutaneous TID WC  . ipratropium-albuterol  3 mL Nebulization TID  . isosorbide dinitrate  30 mg Oral TID  . LORazepam  0.5 mg Intravenous Once  . multivitamin  1 tablet Oral QHS  . ranolazine  1,000 mg Oral BID  . tamsulosin  0.4 mg Oral QPC supper  . Warfarin - Pharmacist  Dosing Inpatient   Does not apply q1800    have reviewed scheduled and prn medications.  Physical Exam: General:  on 2 L of oxygen, more confused - really somnolent  Heart:RRR, s1s2 nl Lungs: Bibasal crackles b/l, no wheezing Abdomen:soft, Non-tender, non-distended Extremities: Bilateral lower extremity pitting edema Dialysis Access: Left AV fistula has good thrill and bruit. Blister proximal-  Popped-  Some erythema around    [place] 07/15/2019,8:12 AM  LOS: 8 days

## 2019-07-15 NOTE — Progress Notes (Addendum)
Pt more alert this PM, awakens easily to voice. Having conversations with me, only disoriented to situation (knows name, time/date, and that he's in the hospital). Will continue to monitor.

## 2019-07-15 NOTE — Progress Notes (Signed)
ANTICOAGULATION CONSULT NOTE - Follow Up Consult  Pharmacy Consult for Warfarin Indication: atrial fibrillation  No Known Allergies  Patient Measurements: Height: 6\' 3"  (190.5 cm) Weight: 218 lb 4.1 oz (99 kg) IBW/kg (Calculated) : 84.5 Heparin Dosing Weight: 101 kg  Vital Signs: Temp: 98.5 F (36.9 C) (12/25 1110) Temp Source: Oral (12/25 1110) BP: 136/40 (12/25 1110) Pulse Rate: 63 (12/25 1110)  Labs: Recent Labs    07/13/19 0316 07/14/19 0254 07/15/19 0446  HGB 7.2* 6.6* 7.7*  HCT 21.8* 19.6* 22.6*  PLT 261 294 265  LABPROT 20.6* 24.1* 28.3*  INR 1.8* 2.2* 2.7*  HEPARINUNFRC 0.27* 0.30 <0.10*  CREATININE 6.46* 6.81*  --     Assessment: 83 year old male presented 12/17 with blood tinged sputum and bilateral LLE edema. EKG suggestive of ischemia. Heparin was stopped 12/18, but patient went into atrial fibrillation and heparin was restarted. No anticoagulation prior to admission. Pharmacy asked to start warfarin on 12/20.    NR is therapeutic at 2.7. His H&H is low at 7.7 s/p 2 units RBC transfusion. Platelets are wnl. No overt signs/symptoms of bleeding. Patient tolerated TDC placement   Goal of Therapy:  INR 2-3 Monitor platelets by anticoagulation protocol: Yes   Plan:  Warfarin 3 mg x 1 today Daily PT/INR and CBC. Monitor for signs of bleeding    Thank you,   Sherren Kerns, PharmD PGY1 Acute Care Pharmacy Resident  Please check amion for clinical pharmacist contact number

## 2019-07-16 DIAGNOSIS — D649 Anemia, unspecified: Secondary | ICD-10-CM

## 2019-07-16 LAB — CULTURE, BLOOD (ROUTINE X 2)
Culture: NO GROWTH
Culture: NO GROWTH

## 2019-07-16 LAB — CBC
HCT: 23.3 % — ABNORMAL LOW (ref 39.0–52.0)
Hemoglobin: 7.8 g/dL — ABNORMAL LOW (ref 13.0–17.0)
MCH: 31.5 pg (ref 26.0–34.0)
MCHC: 33.5 g/dL (ref 30.0–36.0)
MCV: 94 fL (ref 80.0–100.0)
Platelets: 296 10*3/uL (ref 150–400)
RBC: 2.48 MIL/uL — ABNORMAL LOW (ref 4.22–5.81)
RDW: 16.5 % — ABNORMAL HIGH (ref 11.5–15.5)
WBC: 23.1 10*3/uL — ABNORMAL HIGH (ref 4.0–10.5)
nRBC: 0.1 % (ref 0.0–0.2)

## 2019-07-16 LAB — RENAL FUNCTION PANEL
Albumin: 2 g/dL — ABNORMAL LOW (ref 3.5–5.0)
Anion gap: 14 (ref 5–15)
BUN: 100 mg/dL — ABNORMAL HIGH (ref 8–23)
CO2: 23 mmol/L (ref 22–32)
Calcium: 8.4 mg/dL — ABNORMAL LOW (ref 8.9–10.3)
Chloride: 97 mmol/L — ABNORMAL LOW (ref 98–111)
Creatinine, Ser: 5.89 mg/dL — ABNORMAL HIGH (ref 0.61–1.24)
GFR calc Af Amer: 10 mL/min — ABNORMAL LOW (ref >60)
GFR calc non Af Amer: 8 mL/min — ABNORMAL LOW (ref >60)
Glucose, Bld: 200 mg/dL — ABNORMAL HIGH (ref 70–99)
Phosphorus: 6.7 mg/dL — ABNORMAL HIGH (ref 2.5–4.6)
Potassium: 3.8 mmol/L (ref 3.5–5.1)
Sodium: 134 mmol/L — ABNORMAL LOW (ref 135–145)

## 2019-07-16 LAB — PROTIME-INR
INR: 3.4 — ABNORMAL HIGH (ref 0.8–1.2)
Prothrombin Time: 34.3 seconds — ABNORMAL HIGH (ref 11.4–15.2)

## 2019-07-16 LAB — GLUCOSE, CAPILLARY
Glucose-Capillary: 203 mg/dL — ABNORMAL HIGH (ref 70–99)
Glucose-Capillary: 133 mg/dL — ABNORMAL HIGH (ref 70–99)
Glucose-Capillary: 198 mg/dL — ABNORMAL HIGH (ref 70–99)
Glucose-Capillary: 247 mg/dL — ABNORMAL HIGH (ref 70–99)

## 2019-07-16 LAB — PREPARE RBC (CROSSMATCH)

## 2019-07-16 MED ORDER — DARBEPOETIN ALFA 150 MCG/0.3ML IJ SOSY
PREFILLED_SYRINGE | INTRAMUSCULAR | Status: AC
  Administered 2019-07-16: 150 ug via INTRAVENOUS
  Filled 2019-07-16: qty 0.3

## 2019-07-16 MED ORDER — SODIUM CHLORIDE 0.9% IV SOLUTION: Freq: Once | INTRAVENOUS | Status: DC

## 2019-07-16 MED ORDER — CALCIUM ACETATE (PHOS BINDER) 667 MG PO CAPS
1334.0000 mg | ORAL_CAPSULE | Freq: Three times a day (TID) | ORAL | Status: DC
  Administered 2019-07-16 – 2019-07-26 (×26): 1334 mg via ORAL
  Filled 2019-07-16 (×26): qty 2

## 2019-07-16 NOTE — Procedures (Signed)
Patient was seen on dialysis and the procedure was supervised.  BFR 300  Via vascath BP is  144/59.   Patient appears to be tolerating treatment well  [name] 07/16/2019

## 2019-07-16 NOTE — Progress Notes (Signed)
[facility] NEPHROLOGY PROGRESS NOTE  Assessment/ Plan: Pt is a 83 y.o. yo male with history of CAD, CLL in remission, DM 2, CKD 5 follows with Dr. Posey Pronto, baseline creatinine level around 5.6, has a left AV fistula, admitted with a cough, fever and shortness of breath  #CKD stage V with fluid overload and uremia progressed to ESRD: Refractory to diuretics. Initiated HD on 12/19, second on 12/21. Had infiltration of AVF on Mon.      3rd treatment delayed due to access dysfunction- temp cath due to high INR  and  third treatment on 12/24- will plan for 4th treatment today- then next treatment Monday 12/28-  May want to try AVF again ??    Set up for [facility] on MWF second shift at discharge.  Will need either a TDC or use of AVF prior to discharge as well as I think some more tuning up and to make sure his hgb is stable- he is not stable enough for OP HD at this point but looks better today   #Community acquired pneumonia: On antibiotics per primary team. unasyn - should cover his arm as well   #Acute hypoxia: Chest x-ray with mild pulmonary edema.  UF during HD as well.  #Anemia of CKD: Iron saturation 15%.  Ordered IV ferrlecit 250 mg x 3 doses and started ESA- inc dose for hgb in the 7's.  Has received transfusions this hosp      #Hypertension:  BP/volume expect to improve with dialysis.  On hydral 100, isordil, clonidine and coreg-  Likely will be able to wean BP meds eventually , not yet.  Hyponatremia indicates volume overload   #Acute on chronic CHF, non-STEMI: Currently on IV heparin and coumadin.  Cardiology is following. Held coumadin for needed procedure  Bones- no PTH data-  Phos is higher now- will add phoslo and order pth   dispo-  Needs more PT/OT- plan is to return home. More confused yest but better today, his BUN is not helping but also not sure something else is not going on- per primary  - cont to monitor    Subjective:  Seen on HD this AM-  Looks much  better    Objective Vital signs in last 24 hours: Vitals:   07/16/19 0800 07/16/19 0830 07/16/19 0900 07/16/19 0930  BP: (!) 151/64 (!) 149/64 (!) 147/50 (!) 144/59  Pulse: 65 65 66 66  Resp:    16  Temp:    97.7 F (36.5 C)  TempSrc:    Oral  SpO2:    98%  Weight:      Height:       Weight change: -2.2 kg  Intake/Output Summary (Last 24 hours) at 07/16/2019 0943 Last data filed at 07/16/2019 0357 Gross per 24 hour  Intake 260 ml  Output 550 ml  Net -290 ml       Labs: Basic Metabolic Panel: Recent Labs  Lab 07/10/19 0301 07/11/19 0319 07/13/19 0316 07/14/19 0254 07/16/19 0534  NA 137 134* 131* 128* 134*  K 3.8 4.1 4.3 4.5 3.8  CL 104 101 96* 92* 97*  CO2 21* 17* 18* 17* 23  GLUCOSE 184* 155* 189* 161* 200*  BUN 70* 82* 105* 118* 100*  CREATININE 4.65* 5.38* 6.46* 6.81* 5.89*  CALCIUM 8.6* 8.9 8.7* 8.8* 8.4*  PHOS 3.4 5.0*  --   --  6.7*   Liver Function Tests: Recent Labs  Lab 07/10/19 0301 07/11/19 0319 07/16/19 0534  ALBUMIN  2.6* 2.5* 2.0*   No results for input(s): LIPASE, AMYLASE in the last 168 hours. No results for input(s): AMMONIA in the last 168 hours. CBC: Recent Labs  Lab 07/12/19 0327 07/13/19 0316 07/14/19 0254 07/15/19 0446 07/16/19 0534  WBC 24.2* 21.4* 21.7* 20.8* 23.1*  HGB 7.8* 7.2* 6.6* 7.7* 7.8*  HCT 22.6* 21.8* 19.6* 22.6* 23.3*  MCV 93.0 96.5 94.7 91.5 94.0  PLT 270 261 294 265 296   Cardiac Enzymes: No results for input(s): CKTOTAL, CKMB, CKMBINDEX, TROPONINI in the last 168 hours. CBG: Recent Labs  Lab 07/15/19 0615 07/15/19 1108 07/15/19 1623 07/15/19 2214 07/16/19 0625  GLUCAP 112* 125* 107* 217* 203*    Iron Studies:  No results for input(s): IRON, TIBC, TRANSFERRIN, FERRITIN in the last 72 hours. Studies/Results: IR Fluoro Guide CV Line Right  Result Date: 07/14/2019 CLINICAL DATA:  Renal failure, needs access for hemodialysis. On Coumadin. EXAM: EXAM RIGHT IJ CATHETER PLACEMENT UNDER ULTRASOUND  AND FLUOROSCOPIC GUIDANCE TECHNIQUE: The procedure, risks (including but not limited to bleeding, infection, organ damage, pneumothorax), benefits, and alternatives were explained to the family. Questions regarding the procedure were encouraged and answered. The family understands and consents to the procedure. Patency of the right IJ vein was confirmed with ultrasound with image documentation. An appropriate skin site was determined. Skin site was marked. Region was prepped using maximum barrier technique including cap and mask, sterile gown, sterile gloves, large sterile sheet, and Chlorhexidine as cutaneous antisepsis. The region was infiltrated locally with 1% lidocaine. Under real-time ultrasound guidance, the right IJ vein was accessed with a 21 gauge needle; the needle tip within the vein was confirmed with ultrasound image documentation. The needle exchanged over a 018 guidewire for vascular dilator which allowed advancement of a 20 cm Mahurkar catheter. This was positioned with the tip at the cavoatrial junction. Spot chest radiograph shows good positioning and no pneumothorax. Catheter was flushed and sutured externally with 0-Prolene sutures. Patient tolerated the procedure well. FLUOROSCOPY TIME:  0.2 minute; 23 uGym2 DAP COMPLICATIONS: COMPLICATIONS none IMPRESSION: 1. Technically successful right IJ Mahurkar catheter placement. Electronically Signed   By: Lucrezia Europe M.D.   On: 07/14/2019 13:09   IR US Guide Vasc Access Right  Result Date: 07/14/2019 CLINICAL DATA:  Renal failure, needs access for hemodialysis. On Coumadin. EXAM: EXAM RIGHT IJ CATHETER PLACEMENT UNDER ULTRASOUND AND FLUOROSCOPIC GUIDANCE TECHNIQUE: The procedure, risks (including but not limited to bleeding, infection, organ damage, pneumothorax), benefits, and alternatives were explained to the family. Questions regarding the procedure were encouraged and answered. The family understands and consents to the procedure. Patency of  the right IJ vein was confirmed with ultrasound with image documentation. An appropriate skin site was determined. Skin site was marked. Region was prepped using maximum barrier technique including cap and mask, sterile gown, sterile gloves, large sterile sheet, and Chlorhexidine as cutaneous antisepsis. The region was infiltrated locally with 1% lidocaine. Under real-time ultrasound guidance, the right IJ vein was accessed with a 21 gauge needle; the needle tip within the vein was confirmed with ultrasound image documentation. The needle exchanged over a 018 guidewire for vascular dilator which allowed advancement of a 20 cm Mahurkar catheter. This was positioned with the tip at the cavoatrial junction. Spot chest radiograph shows good positioning and no pneumothorax. Catheter was flushed and sutured externally with 0-Prolene sutures. Patient tolerated the procedure well. FLUOROSCOPY TIME:  0.2 minute; 23 uGym2 DAP COMPLICATIONS: COMPLICATIONS none IMPRESSION: 1. Technically successful right IJ Mahurkar catheter placement. Electronically  Signed   By: Lucrezia Europe M.D.   On: 07/14/2019 13:09    Medications: Infusions: . ampicillin-sulbactam (UNASYN) IV 3 g (07/16/19 0543)    Scheduled Medications: . sodium chloride   Intravenous Once  . sodium chloride   Intravenous Once  . aspirin EC  81 mg Oral QHS  . atorvastatin  20 mg Oral Daily  . carvedilol  25 mg Oral BID WC  . Chlorhexidine Gluconate Cloth  6 each Topical Q0600  . cloNIDine  0.1 mg Oral BID  . darbepoetin (ARANESP) injection - DIALYSIS  150 mcg Intravenous Q Sat-HD  . finasteride  5 mg Oral QPM  . hydrALAZINE  100 mg Oral TID  . insulin aspart  0-9 Units Subcutaneous TID WC  . ipratropium-albuterol  3 mL Nebulization BID  . isosorbide dinitrate  30 mg Oral TID  . LORazepam  0.5 mg Intravenous Once  . multivitamin  1 tablet Oral QHS  . ranolazine  1,000 mg Oral BID  . tamsulosin  0.4 mg Oral QPC supper  . Warfarin - Pharmacist Dosing  Inpatient   Does not apply q1800    have reviewed scheduled and prn medications.  Physical Exam: General:  on 2 L of oxygen,  - more alert  Heart:RRR, s1s2 nl Lungs: Bibasal crackles b/l, no wheezing Abdomen:soft, Non-tender, non-distended Extremities: Bilateral lower extremity pitting edema Dialysis Access: Left AV fistula has good thrill and bruit. Blister proximal-  Popped-  Some erythema around which is better and overall arm swelling    [facility] A Parris Signer 07/16/2019,9:43 AM  LOS: 9 days

## 2019-07-16 NOTE — Plan of Care (Signed)

## 2019-07-16 NOTE — Progress Notes (Signed)
MEDICATION-RELATED CONSULT NOTE   IR Procedure Consult - Anticoagulant/Antiplatelet PTA/Inpatient Med List Review by Pharmacist    Procedure: [facility] placement    Completed: 07/14/19  Post-Procedural bleeding risk per IR MD assessment:  low  Antithrombotic medications on inpatient or PTA profile prior to procedure:  Warfarin, aspirin    Recommended restart time per IR Post-Procedure Guidelines:  Day 0 (evening)   Plan:     Holding warfarin tonight due to high INR Continue aspirin

## 2019-07-16 NOTE — Progress Notes (Signed)
ANTICOAGULATION CONSULT NOTE - Follow Up Consult  Pharmacy Consult for Warfarin Indication: atrial fibrillation  No Known Allergies  Patient Measurements: Height: 6\' 3"  (190.5 cm) Weight: 216 lb 0.8 oz (98 kg) IBW/kg (Calculated) : 84.5 Heparin Dosing Weight: 101 kg  Vital Signs: Temp: 99.2 F (37.3 C) (12/26 1142) Temp Source: Oral (12/26 1142) BP: 164/48 (12/26 1142) Pulse Rate: 69 (12/26 1142)  Labs: Recent Labs    07/14/19 0254 07/15/19 0446 07/16/19 0534  HGB 6.6* 7.7* 7.8*  HCT 19.6* 22.6* 23.3*  PLT 294 265 296  LABPROT 24.1* 28.3* 34.3*  INR 2.2* 2.7* 3.4*  HEPARINUNFRC 0.30 <0.10*  --   CREATININE 6.81*  --  5.89*    Assessment: 83 year old male presented 12/17 with blood tinged sputum and bilateral LLE edema. EKG suggestive of ischemia. Heparin was stopped 12/18, but patient went into atrial fibrillation and heparin was restarted. No anticoagulation prior to admission. Pharmacy asked to start warfarin on 12/20.    INR is supratherapeutic at 3.4. H&H is low but stable at 7.8 s/p 2 units RBC transfusion. Platelets are wnl. No overt signs/symptoms of bleeding. Patient tolerated TDC placement   Goal of Therapy:  INR 2-3 Monitor platelets by anticoagulation protocol: Yes   Plan:  Hold warfarin today Daily PT/INR and CBC. Monitor for signs of bleeding    Thank you,   Sherren Kerns, PharmD PGY1 Acute Care Pharmacy Resident  Please check amion for clinical pharmacist contact number

## 2019-07-16 NOTE — Progress Notes (Signed)
PROGRESS NOTE  Ray Merritt VQM:[id number] DOB: [birth year] DOA: 07/07/2019 PCP: [name], MD  HPI/Recap of past 90 hours: 83 year old gentleman with history of hypertension, chronic kidney disease stage IV nearing hemodialysis, coronary artery disease, recent abnormal stress test, type 2 diabetes and CLL presented to the emergency room with shortness of breath, cough with blood-tinged sputum since morning.  He had some atypical chest pain.  Recently with weight gain and extremity edema.  He does have left AV arm AV fistula that has not been used yet. In the emergency room, febrile with temperature 100.6, blood pressures elevated, 2 L nasal cannula placed for comfort.  WBC 16.8.  Creatinine 6.61.  EKG revealed ST depression in inferior lateral leads.  Mildly elevated troponins.  Chest x-ray with fluid in the fissures, suspected atelectasis versus pneumonia.COVID-19 point-of-care and PCR test negative.    Admitted for IV antibiotics and heparin infusion with cardiology and nephrology consult.  Acute blood loss anemia requiring 1 unit irradiated RBC 12/19.  Progressing to ESRD first HD on 07/09/2019, then 07/11/19.  Nephrology has outpatient HD spot - can DC as early as 12/26 after dialysis that day. Continues to have issues with dialysis access which may delay disposition.  Subjective:  NO Significant events overnight as discussed with staff, patient denies any complaints   Assessment/Plan: Principal Problem:   Sepsis ([place]) Active Problems:   CLL (chronic lymphocytic leukemia) (HCC)   Type 2 diabetes, HbA1c goal < 7% (HCC)   Acute renal failure superimposed on chronic kidney disease (HCC)   Acute on chronic combined systolic and diastolic CHF (congestive heart failure) (HCC)   Hypertensive urgency   Urinary tract infection   Abnormal EKG   Elevated troponin   New ESRD on HD - First HD on 07/09/2019 -Hemodialysis per renal service. -Patient is set up for [name] on  Monday Wednesday Friday schedule second shift at discharge. -Renal Input greatly appreciated, patient not stable enough for outpatient hemodialysis at this point, but appears to be improving. -He will need either a TDC or use of aVF prior to discharge,   Sepsis secondary to Enterococcous Faecalis community-acquired pneumonia, POA Concurrent E. coli UTI, likely POA -Urine culture grew greater than 100,000 colonies of E. coli, pansensitive. -Sputum culture grew abundant enterococous faecalis -Azithromycin/ceftriaxone discontinued, transition to Unasyn on 07/12/2019 -Tentative stop date 07/16/19 for Unasyn unless recurrent fever/worsening clinical course -Chest x-ray personally reviewed -right middle lobe opacification, not noted on x-ray 07/10/2019 -Without fever since 17th -Blood cultures continue to be preliminarily negative  Anemia of chronic disease complicated by iron deficiency, ongoing -So far required 3 units PRBC transfusion, hemoglobin this morning 7.8, will transfuse 1 unit PRBC . - No sign of overt bleeding - Nephrology managing with ESA and iron repletion.  Worsening hypervolemic hyponatremia Management with dialysis  Volume overload in the setting of new ESRD Management Per nephrology/dialysis, improving with hemodialysis  NSTEMI Followed by cardiology On aspirin and Lipitor Continue cardiac medications as recommended by cardiology.  Paroxysmal A. fib CHA2DS2-VASc score 6 Anticoagulate with Coumadin per cardiology Continue warfarin, managed by pharmacy  Subtherapeutic INR, resolving Management per above Goal INR between 2 and 3: per discussion with cardiology can keep patient closer to 2 given bleeding issues as above with dialysis Daily INR Lab Results  Component Value Date   INR 3.4 (H) 07/16/2019   INR 2.7 (H) 07/15/2019   INR 2.2 (H) 07/14/2019    BPH Stable Continue home medications Continue to monitor urine output  Acute on chronic combined  systolic and diastolic CHF Continue strict I's and O's and daily weight Continue cardiac medications Volume managed by hemodialysis and IV Lasix as needed for volume overload Received 60 mg IV Lasix 1 today  Essential hypertension Likely will need to reduce doses of antihypertensives Continue to closely monitor vital signs Continue clonidine at lower dose 0.1 mg twice daily Hold off Coreg and hydralazine to give room for diuresing as needed before hemodialysis  CLL Will need to follow-up outpatient  Physical debility, ambulatory dysfunction PT OT to assess Continue PT OT with assistance and fall precautions.  DVT prophylaxis:  Coumadin Code Status:  DNR  Family Communication: None  Disposition Plan: Pending clinical improvement, nephrology and cardiology signing off.  Consultants:   Cardiology, Dr. Einar Gip  Nephrology, Dr. Moshe Cipro  Interventional radiology  Procedures:   None  Antimicrobials:   Rocephin, azithromycin, 07/07/2019--- 07/11/19  Unasyn 07/11/2019---ongoing -tentative stop date 07/16/2019  Objective: Vitals:   07/16/19 1100 07/16/19 1115 07/16/19 1142 07/16/19 1305  BP: (!) 155/66 (!) 142/58 (!) 164/48 (!) 161/54  Pulse: 65 66 69 71  Resp:  18 14 17   Temp:  97.8 F (36.6 C) 99.2 F (37.3 C) 100.1 F (37.8 C)  TempSrc:  Oral Oral Oral  SpO2:  100% 97% 96%  Weight:  98 kg    Height:        Intake/Output Summary (Last 24 hours) at 07/16/2019 1508 Last data filed at 07/16/2019 1115 Gross per 24 hour  Intake 260 ml  Output 3250 ml  Net -2990 ml   Filed Weights   07/16/19 0549 07/16/19 0700 07/16/19 1115  Weight: 101.3 kg 100 kg 98 kg    Exam:  Awake Alert, frail, chronically ill-appearing  Right neck temporary hemodialysis catheter symmetrical Chest wall movement, Good air movement bilaterally, CTAB RRR,No Gallops,Rubs or new Murmurs, No Parasternal Heave +ve B.Sounds, Abd Soft, No tenderness, No rebound - guarding or  rigidity. No Cyanosis, Clubbing or edema, No new Rash or bruise      Data Reviewed: CBC: Recent Labs  Lab 07/12/19 0327 07/13/19 0316 07/14/19 0254 07/15/19 0446 07/16/19 0534  WBC 24.2* 21.4* 21.7* 20.8* 23.1*  HGB 7.8* 7.2* 6.6* 7.7* 7.8*  HCT 22.6* 21.8* 19.6* 22.6* 23.3*  MCV 93.0 96.5 94.7 91.5 94.0  PLT 270 261 294 265 259   Basic Metabolic Panel: Recent Labs  Lab 07/10/19 0301 07/11/19 0319 07/12/19 0327 07/13/19 0316 07/14/19 0254 07/16/19 0534  NA 137 134* 132* 131* 128* 134*  K 3.8 4.1 4.1 4.3 4.5 3.8  CL 104 101 96* 96* 92* 97*  CO2 21* 17* 21* 18* 17* 23  GLUCOSE 184* 155* 187* 189* 161* 200*  BUN 70* 82* 72* 105* 118* 100*  CREATININE 4.65* 5.38* 5.40* 6.46* 6.81* 5.89*  CALCIUM 8.6* 8.9 8.9 8.7* 8.8* 8.4*  PHOS 3.4 5.0*  --   --   --  6.7*   GFR: Estimated Creatinine Clearance: 11.8 mL/min (A) (by C-G formula based on SCr of 5.89 mg/dL (H)). Liver Function Tests: Recent Labs  Lab 07/10/19 0301 07/11/19 0319 07/16/19 0534  ALBUMIN 2.6* 2.5* 2.0*   No results for input(s): LIPASE, AMYLASE in the last 168 hours. No results for input(s): AMMONIA in the last 168 hours. Coagulation Profile: Recent Labs  Lab 07/12/19 0327 07/13/19 0316 07/14/19 0254 07/15/19 0446 07/16/19 0534  INR 1.5* 1.8* 2.2* 2.7* 3.4*   Cardiac Enzymes: No results for input(s): CKTOTAL, CKMB, CKMBINDEX, TROPONINI in the last 168  hours. BNP (last 3 results) No results for input(s): PROBNP in the last 8760 hours. HbA1C: No results for input(s): HGBA1C in the last 72 hours. CBG: Recent Labs  Lab 07/15/19 1108 07/15/19 1623 07/15/19 2214 07/16/19 0625 07/16/19 1250  GLUCAP 125* 107* 217* 203* 133*   Lipid Profile: No results for input(s): CHOL, HDL, LDLCALC, TRIG, CHOLHDL, LDLDIRECT in the last 72 hours. Thyroid Function Tests: No results for input(s): TSH, T4TOTAL, FREET4, T3FREE, THYROIDAB in the last 72 hours. Anemia Panel: No results for input(s):  VITAMINB12, FOLATE, FERRITIN, TIBC, IRON, RETICCTPCT in the last 72 hours. Urine analysis:    Component Value Date/Time   COLORURINE YELLOW 07/07/2019 1409   APPEARANCEUR HAZY (A) 07/07/2019 1409   LABSPEC 1.014 07/07/2019 1409   PHURINE 5.0 07/07/2019 1409   GLUCOSEU NEGATIVE 07/07/2019 1409   HGBUR NEGATIVE 07/07/2019 1409   BILIRUBINUR NEGATIVE 07/07/2019 1409   KETONESUR NEGATIVE 07/07/2019 1409   PROTEINUR 100 (A) 07/07/2019 1409   UROBILINOGEN 0.2 10/07/2011 1203   NITRITE NEGATIVE 07/07/2019 1409   LEUKOCYTESUR SMALL (A) 07/07/2019 1409    Recent Results (from the past 240 hour(s))  Blood Culture (routine x 2)     Status: None   Collection Time: 07/07/19 10:57 AM   Specimen: BLOOD RIGHT ARM  Result Value Ref Range Status   Specimen Description BLOOD RIGHT ARM  Final   Special Requests   Final    BOTTLES DRAWN AEROBIC AND ANAEROBIC Blood Culture adequate volume   Culture   Final    NO GROWTH 5 DAYS Performed at [facility], [place] [address], [place], [place] [address]    Report Status 07/12/2019 FINAL  Final  Blood Culture (routine x 2)     Status: None   Collection Time: 07/07/19 11:49 AM   Specimen: BLOOD RIGHT FOREARM  Result Value Ref Range Status   Specimen Description BLOOD RIGHT FOREARM  Final   Special Requests   Final    BOTTLES DRAWN AEROBIC ONLY Blood Culture adequate volume   Culture   Final    NO GROWTH 5 DAYS Performed at [facility], [place] [address], [place], [place] [address]    Report Status 07/12/2019 FINAL  Final  Respiratory Panel by RT PCR (Flu A&B, Covid) - Nasopharyngeal Swab     Status: None   Collection Time: 07/07/19  1:47 PM   Specimen: Nasopharyngeal Swab  Result Value Ref Range Status   SARS Coronavirus 2 by RT PCR NEGATIVE NEGATIVE Final    Comment: (NOTE) SARS-CoV-2 target nucleic acids are NOT DETECTED. The SARS-CoV-2 RNA is generally detectable in upper respiratoy specimens during the acute phase of infection. The  lowest concentration of SARS-CoV-2 viral copies this assay can detect is 131 copies/mL. A negative result does not preclude SARS-Cov-2 infection and should not be used as the sole basis for treatment or other patient management decisions. A negative result may occur with  improper specimen collection/handling, submission of specimen other than nasopharyngeal swab, presence of viral mutation(s) within the areas targeted by this assay, and inadequate number of viral copies (<131 copies/mL). A negative result must be combined with clinical observations, patient history, and epidemiological information. The expected result is Negative. Fact Sheet for Patients:  PinkCheek.be Fact Sheet for Healthcare Providers:  GravelBags.it This test is not yet ap proved or cleared by the [place] FDA and  has been authorized for detection and/or diagnosis of SARS-CoV-2 by FDA under an Emergency Use Authorization (EUA). This EUA  will remain  in effect (meaning this test can be used) for the duration of the COVID-19 declaration under Section 564(b)(1) of the Act, 21 U.S.C. section 360bbb-3(b)(1), unless the authorization is terminated or revoked sooner.    Influenza A by PCR NEGATIVE NEGATIVE Final   Influenza B by PCR NEGATIVE NEGATIVE Final    Comment: (NOTE) The Xpert Xpress SARS-CoV-2/FLU/RSV assay is intended as an aid in  the diagnosis of influenza from Nasopharyngeal swab specimens and  should not be used as a sole basis for treatment. Nasal washings and  aspirates are unacceptable for Xpert Xpress SARS-CoV-2/FLU/RSV  testing. Fact Sheet for Patients: PinkCheek.be Fact Sheet for Healthcare Providers: GravelBags.it This test is not yet approved or cleared by the [place] FDA and  has been authorized for detection and/or diagnosis of SARS-CoV-2 by  FDA under an Emergency  Use Authorization (EUA). This EUA will remain  in effect (meaning this test can be used) for the duration of the  Covid-19 declaration under Section 564(b)(1) of the Act, 21  U.S.C. section 360bbb-3(b)(1), unless the authorization is  terminated or revoked. Performed at [facility], [place] [address] [place], [place] [address]   Respiratory Panel by PCR     Status: None   Collection Time: 07/07/19  1:47 PM   Specimen: Nasopharyngeal Swab; Respiratory  Result Value Ref Range Status   Adenovirus NOT DETECTED NOT DETECTED Final   Coronavirus 229E NOT DETECTED NOT DETECTED Final    Comment: (NOTE) The Coronavirus on the Respiratory Panel, DOES NOT test for the novel  Coronavirus (2019 nCoV)    Coronavirus HKU1 NOT DETECTED NOT DETECTED Final   Coronavirus NL63 NOT DETECTED NOT DETECTED Final   Coronavirus OC43 NOT DETECTED NOT DETECTED Final   Metapneumovirus NOT DETECTED NOT DETECTED Final   Rhinovirus / Enterovirus NOT DETECTED NOT DETECTED Final   Influenza A NOT DETECTED NOT DETECTED Final   Influenza B NOT DETECTED NOT DETECTED Final   Parainfluenza Virus 1 NOT DETECTED NOT DETECTED Final   Parainfluenza Virus 2 NOT DETECTED NOT DETECTED Final   Parainfluenza Virus 3 NOT DETECTED NOT DETECTED Final   Parainfluenza Virus 4 NOT DETECTED NOT DETECTED Final   Respiratory Syncytial Virus NOT DETECTED NOT DETECTED Final   Bordetella pertussis NOT DETECTED NOT DETECTED Final   Chlamydophila pneumoniae NOT DETECTED NOT DETECTED Final   Mycoplasma pneumoniae NOT DETECTED NOT DETECTED Final    Comment: Performed at [facility], [place]. [address], [place], [place] [address]  Urine culture     Status: Abnormal   Collection Time: 07/07/19  2:13 PM   Specimen: Urine, Random  Result Value Ref Range Status   Specimen Description URINE, RANDOM  Final   Special Requests   Final    NONE Performed at [facility], [place] [address], [place], [place] [address]    Culture >=100,000  COLONIES/mL ESCHERICHIA COLI (A)  Final   Report Status 07/09/2019 FINAL  Final   Organism ID, Bacteria ESCHERICHIA COLI (A)  Final      Susceptibility   Escherichia coli - MIC*    AMPICILLIN 8 SENSITIVE Sensitive     CEFAZOLIN <=4 SENSITIVE Sensitive     CEFTRIAXONE <=1 SENSITIVE Sensitive     CIPROFLOXACIN <=0.25 SENSITIVE Sensitive     GENTAMICIN <=1 SENSITIVE Sensitive     IMIPENEM <=0.25 SENSITIVE Sensitive     NITROFURANTOIN <=16 SENSITIVE Sensitive     TRIMETH/SULFA <=20 SENSITIVE Sensitive  AMPICILLIN/SULBACTAM 4 SENSITIVE Sensitive     PIP/TAZO <=4 SENSITIVE Sensitive     * >=100,000 COLONIES/mL ESCHERICHIA COLI  Expectorated sputum assessment w rflx to resp cult     Status: None   Collection Time: 07/09/19  1:26 AM   Specimen: SPU  Result Value Ref Range Status   Specimen Description SPUTUM  Final   Special Requests NONE  Final   Sputum evaluation   Final    THIS SPECIMEN IS ACCEPTABLE FOR SPUTUM CULTURE Performed at [facility], [place] [address] [place], [place] [address]    Report Status 07/09/2019 FINAL  Final  Culture, respiratory     Status: None   Collection Time: 07/09/19  1:26 AM   Specimen: SPU  Result Value Ref Range Status   Specimen Description SPUTUM  Final   Special Requests NONE Reflexed from [id number]  Final   Gram Stain   Final    MODERATE WBC PRESENT, PREDOMINANTLY PMN FEW GRAM POSITIVE COCCI IN CHAINS FEW GRAM NEGATIVE RODS    Culture   Final    Consistent with normal respiratory flora. Performed at [facility], [place] [address] [place], [place] [address]    Report Status 07/11/2019 FINAL  Final  MRSA PCR Screening     Status: None   Collection Time: 07/11/19  6:35 AM   Specimen: Nasal Mucosa; Nasopharyngeal  Result Value Ref Range Status   MRSA by PCR NEGATIVE NEGATIVE Final    Comment:        The GeneXpert MRSA Assay (FDA approved for NASAL specimens only), is one component of a comprehensive MRSA  colonization surveillance program. It is not intended to diagnose MRSA infection nor to guide or monitor treatment for MRSA infections. Performed at [facility], [place] [address] [place], [place] [address]   Culture, blood (routine x 2)     Status: None   Collection Time: 07/11/19  7:30 AM   Specimen: BLOOD RIGHT HAND  Result Value Ref Range Status   Specimen Description BLOOD RIGHT HAND  Final   Special Requests   Final    BOTTLES DRAWN AEROBIC ONLY Blood Culture results may not be optimal due to an inadequate volume of blood received in culture bottles   Culture   Final    NO GROWTH 5 DAYS Performed at [facility], [place] [address] [place] [address], [place], [place] [address]    Report Status 07/16/2019 FINAL  Final  Culture, blood (routine x 2)     Status: None   Collection Time: 07/11/19  7:35 AM   Specimen: BLOOD RIGHT HAND  Result Value Ref Range Status   Specimen Description BLOOD RIGHT HAND  Final   Special Requests   Final    BOTTLES DRAWN AEROBIC ONLY Blood Culture results may not be optimal due to an inadequate volume of blood received in culture bottles   Culture   Final    NO GROWTH 5 DAYS Performed at [facility], [place] [address] [place], [place] [address]    Report Status 07/16/2019 FINAL  Final  Expectorated sputum assessment w rflx to resp cult     Status: None   Collection Time: 07/11/19  9:19 AM   Specimen: Sputum  Result Value Ref Range Status   Specimen Description SPUTUM  Final   Special Requests Immunocompromised  Final   Sputum evaluation   Final    THIS SPECIMEN IS ACCEPTABLE FOR SPUTUM CULTURE Performed at [facility],  [address], [place], [place] [address]    Report Status 07/11/2019 FINAL  Final  Culture, respiratory     Status: None   Collection Time: 07/11/19  9:19 AM   Specimen: SPU  Result Value Ref Range Status   Specimen Description SPUTUM  Final   Special Requests Immunocompromised Reflexed from [id number]  Final   Gram Stain    Final    RARE WBC PRESENT, PREDOMINANTLY PMN FEW GRAM POSITIVE COCCI IN CLUSTERS RARE YEAST Performed at [facility], [place] [address] [place], [place] [address]    Culture ABUNDANT ENTEROCOCCUS FAECALIS  Final   Report Status 07/13/2019 FINAL  Final   Organism ID, Bacteria ENTEROCOCCUS FAECALIS  Final      Susceptibility   Enterococcus faecalis - MIC*    AMPICILLIN <=2 SENSITIVE Sensitive     VANCOMYCIN 2 SENSITIVE Sensitive     GENTAMICIN SYNERGY SENSITIVE Sensitive     * ABUNDANT ENTEROCOCCUS FAECALIS      Studies: No results found.  Scheduled Meds: . sodium chloride   Intravenous Once  . sodium chloride   Intravenous Once  . aspirin EC  81 mg Oral QHS  . atorvastatin  20 mg Oral Daily  . calcium acetate  1,334 mg Oral TID WC  . carvedilol  25 mg Oral BID WC  . Chlorhexidine Gluconate Cloth  6 each Topical Q0600  . cloNIDine  0.1 mg Oral BID  . darbepoetin (ARANESP) injection - DIALYSIS  150 mcg Intravenous Q Sat-HD  . finasteride  5 mg Oral QPM  . hydrALAZINE  100 mg Oral TID  . insulin aspart  0-9 Units Subcutaneous TID WC  . ipratropium-albuterol  3 mL Nebulization BID  . isosorbide dinitrate  30 mg Oral TID  . LORazepam  0.5 mg Intravenous Once  . multivitamin  1 tablet Oral QHS  . ranolazine  1,000 mg Oral BID  . tamsulosin  0.4 mg Oral QPC supper  . Warfarin - Pharmacist Dosing Inpatient   Does not apply q1800    Continuous Infusions: . ampicillin-sulbactam (UNASYN) IV 3 g (07/16/19 0543)     LOS: 9 days     Phillips Climes, MD [facility] Pager [phone]  If 7PM-7AM, please contact night-coverage [email or web address] Password [facility] 07/16/2019, 3:08 PM

## 2019-07-17 ENCOUNTER — Other Ambulatory Visit: Payer: Self-pay | Admitting: Cardiology

## 2019-07-17 DIAGNOSIS — C9110 Chronic lymphocytic leukemia of B-cell type not having achieved remission: Secondary | ICD-10-CM

## 2019-07-17 LAB — BPAM RBC
Blood Product Expiration Date: 202101042359
Blood Product Expiration Date: 202101062359
Blood Product Expiration Date: 202101152359
ISSUE DATE / TIME: 202012241357
ISSUE DATE / TIME: 202012242257
ISSUE DATE / TIME: 202012260923
Unit Type and Rh: 5100
Unit Type and Rh: 6200
Unit Type and Rh: 6200

## 2019-07-17 LAB — TYPE AND SCREEN
ABO/RH(D): A POS
Antibody Screen: NEGATIVE
Unit division: 0
Unit division: 0
Unit division: 0

## 2019-07-17 LAB — GLUCOSE, CAPILLARY
Glucose-Capillary: 178 mg/dL — ABNORMAL HIGH (ref 70–99)
Glucose-Capillary: 159 mg/dL — ABNORMAL HIGH (ref 70–99)
Glucose-Capillary: 183 mg/dL — ABNORMAL HIGH (ref 70–99)
Glucose-Capillary: 152 mg/dL — ABNORMAL HIGH (ref 70–99)
Glucose-Capillary: 219 mg/dL — ABNORMAL HIGH (ref 70–99)

## 2019-07-17 LAB — PARATHYROID HORMONE, INTACT (NO CA): PTH: 164 pg/mL — ABNORMAL HIGH (ref 15–65)

## 2019-07-17 LAB — PROTIME-INR
INR: 2.4 — ABNORMAL HIGH (ref 0.8–1.2)
Prothrombin Time: 25.9 seconds — ABNORMAL HIGH (ref 11.4–15.2)

## 2019-07-17 MED ORDER — WARFARIN SODIUM 2.5 MG PO TABS
2.5000 mg | ORAL_TABLET | Freq: Once | ORAL | Status: AC
  Administered 2019-07-17: 2.5 mg via ORAL
  Filled 2019-07-17: qty 1

## 2019-07-17 NOTE — Progress Notes (Signed)
ANTICOAGULATION CONSULT NOTE - Follow Up Consult  Pharmacy Consult for Warfarin Indication: atrial fibrillation  No Known Allergies  Patient Measurements: Height: 6\' 3"  (190.5 cm) Weight: 217 lb 6 oz (98.6 kg) IBW/kg (Calculated) : 84.5 Heparin Dosing Weight: 101 kg  Vital Signs: Temp: 98.2 F (36.8 C) (12/27 1132) Temp Source: Oral (12/27 1132) BP: 133/44 (12/27 1132) Pulse Rate: 64 (12/27 1132)  Labs: Recent Labs    07/15/19 0446 07/16/19 0534 07/17/19 0355  HGB 7.7* 7.8*  --   HCT 22.6* 23.3*  --   PLT 265 296  --   LABPROT 28.3* 34.3* 25.9*  INR 2.7* 3.4* 2.4*  HEPARINUNFRC <0.10*  --   --   CREATININE  --  5.89*  --     Assessment: 83 year old male presented 12/17 with blood tinged sputum and bilateral LLE edema. EKG suggestive of ischemia. Heparin was stopped 12/18, but patient went into atrial fibrillation and heparin was restarted. No anticoagulation prior to admission. Pharmacy asked to start warfarin on 12/20.    INR is therapeutic at 2.4. H&H is low but stable at 7.8 after receiving RBC transfusions. Platelets are wnl. No overt signs/symptoms of bleeding.   Goal of Therapy:  INR 2-3 Monitor platelets by anticoagulation protocol: Yes   Plan:  Warfarin 2.5 x 1 today Daily PT/INR and CBC. Monitor for signs of bleeding  Thank you,   Sherren Kerns, PharmD PGY1 Acute Care Pharmacy Resident  Please check amion for clinical pharmacist contact number

## 2019-07-17 NOTE — Progress Notes (Signed)
Updated the patient's daughter Ms. Melonie via phone.  All questions answered to her satisfaction.

## 2019-07-17 NOTE — Plan of Care (Signed)

## 2019-07-17 NOTE — Progress Notes (Signed)
Admit: 07/07/2019 LOS: 10  33M new ESRD, CAP  Subjective:  . No new issues . Arm still painful, says swelling improved  . Unasyn for PNA  12/26 0701 - 12/27 0700 In: 640 [P.O.:240; IV Piggyback:400] Out: 3080 [Urine:380]  Filed Weights   07/16/19 0700 07/16/19 1115 07/17/19 0449  Weight: 100 kg 98 kg 98.6 kg    Scheduled Meds: . sodium chloride   Intravenous Once  . sodium chloride   Intravenous Once  . aspirin EC  81 mg Oral QHS  . atorvastatin  20 mg Oral Daily  . calcium acetate  1,334 mg Oral TID WC  . carvedilol  25 mg Oral BID WC  . Chlorhexidine Gluconate Cloth  6 each Topical Q0600  . cloNIDine  0.1 mg Oral BID  . darbepoetin (ARANESP) injection - DIALYSIS  150 mcg Intravenous Q Sat-HD  . finasteride  5 mg Oral QPM  . hydrALAZINE  100 mg Oral TID  . insulin aspart  0-9 Units Subcutaneous TID WC  . ipratropium-albuterol  3 mL Nebulization BID  . isosorbide dinitrate  30 mg Oral TID  . LORazepam  0.5 mg Intravenous Once  . multivitamin  1 tablet Oral QHS  . ranolazine  1,000 mg Oral BID  . tamsulosin  0.4 mg Oral QPC supper  . warfarin  2.5 mg Oral ONCE-1800  . Warfarin - Pharmacist Dosing Inpatient   Does not apply q1800   Continuous Infusions: . ampicillin-sulbactam (UNASYN) IV Stopped (07/17/19 0630)   PRN Meds:.acetaminophen **OR** acetaminophen, lidocaine, ondansetron **OR** ondansetron (ZOFRAN) IV  Current Labs: reviewed    Physical Exam:  Blood pressure (!) 133/44, pulse 64, temperature 98.2 F (36.8 C), temperature source Oral, resp. rate (!) 21, height 6\' 3"  (1.905 m), weight 98.6 kg, SpO2 94 %. NAD elderly male RRR L RC AVF +BT, not sig swollen 1-2+ LEE S/nt/nd CTAB  A 1. New ESRD was CKD5; CLIP is [facility] MWF 2nd shift.  2. L RC AVF, infiltrated, with TEMP HD cath at current time 3. CAP, on unasyn B Cx NG 4. HTN, stable; needs vol down 5. Anemia: Getting Fe, ESA, CTM 6. CKD BMD: PhosLo, follow with HD.    P . HD tomorrow: 4h, 3-4L UF BP  permitting, try 33L in AVF, no heparin . If can't cannulate will plan for [facility] and prolonged AVF rest . Check PTH . Medication Issues; o Preferred narcotic agents for pain control are hydromorphone, fentanyl, and methadone. Morphine should not be used.  o Baclofen should be avoided o Avoid oral sodium phosphate and magnesium citrate based laxatives / bowel preps    Pearson Grippe MD 07/17/2019, 3:24 PM  Recent Labs  Lab 07/11/19 0319 07/13/19 0316 07/14/19 0254 07/16/19 0534  NA 134* 131* 128* 134*  K 4.1 4.3 4.5 3.8  CL 101 96* 92* 97*  CO2 17* 18* 17* 23  GLUCOSE 155* 189* 161* 200*  BUN 82* 105* 118* 100*  CREATININE 5.38* 6.46* 6.81* 5.89*  CALCIUM 8.9 8.7* 8.8* 8.4*  PHOS 5.0*  --   --  6.7*   Recent Labs  Lab 07/14/19 0254 07/15/19 0446 07/16/19 0534  WBC 21.7* 20.8* 23.1*  HGB 6.6* 7.7* 7.8*  HCT 19.6* 22.6* 23.3*  MCV 94.7 91.5 94.0  PLT 294 265 296

## 2019-07-17 NOTE — Plan of Care (Signed)
Problem: Education: Goal: Knowledge of General Education information will improve Description: Including pain rating scale, medication(s)/side effects and non-pharmacologic comfort measures 07/17/2019 1431 by Glenard Haring, RN Outcome: Progressing 07/17/2019 1430 by Glenard Haring, RN Outcome: Progressing   Problem: Health Behavior/Discharge Planning: Goal: Ability to manage health-related needs will improve 07/17/2019 1431 by Glenard Haring, RN Outcome: Progressing 07/17/2019 1430 by Glenard Haring, RN Outcome: Progressing   Problem: Clinical Measurements: Goal: Ability to maintain clinical measurements within normal limits will improve 07/17/2019 1431 by Glenard Haring, RN Outcome: Progressing 07/17/2019 1430 by Glenard Haring, RN Outcome: Progressing Goal: Will remain free from infection 07/17/2019 1431 by Glenard Haring, RN Outcome: Progressing 07/17/2019 1430 by Glenard Haring, RN Outcome: Progressing Goal: Diagnostic test results will improve 07/17/2019 1431 by Glenard Haring, RN Outcome: Progressing 07/17/2019 1430 by Glenard Haring, RN Outcome: Progressing Goal: Respiratory complications will improve 07/17/2019 1431 by Glenard Haring, RN Outcome: Progressing 07/17/2019 1430 by Glenard Haring, RN Outcome: Progressing Goal: Cardiovascular complication will be avoided 07/17/2019 1431 by Glenard Haring, RN Outcome: Progressing 07/17/2019 1430 by Glenard Haring, RN Outcome: Progressing   Problem: Activity: Goal: Risk for activity intolerance will decrease 07/17/2019 1431 by Glenard Haring, RN Outcome: Progressing 07/17/2019 1430 by Glenard Haring, RN Outcome: Progressing   Problem: Nutrition: Goal: Adequate nutrition will be maintained 07/17/2019 1431 by Glenard Haring, RN Outcome: Progressing 07/17/2019 1430 by Glenard Haring, RN Outcome: Progressing   Problem: Coping: Goal: Level of anxiety will  decrease 07/17/2019 1431 by Glenard Haring, RN Outcome: Progressing 07/17/2019 1430 by Glenard Haring, RN Outcome: Progressing   Problem: Elimination: Goal: Will not experience complications related to bowel motility 07/17/2019 1431 by Glenard Haring, RN Outcome: Progressing 07/17/2019 1430 by Glenard Haring, RN Outcome: Progressing Goal: Will not experience complications related to urinary retention 07/17/2019 1431 by Glenard Haring, RN Outcome: Progressing 07/17/2019 1430 by Glenard Haring, RN Outcome: Progressing   Problem: Pain Managment: Goal: General experience of comfort will improve 07/17/2019 1431 by Glenard Haring, RN Outcome: Progressing 07/17/2019 1430 by Glenard Haring, RN Outcome: Progressing   Problem: Safety: Goal: Ability to remain free from injury will improve 07/17/2019 1431 by Glenard Haring, RN Outcome: Progressing 07/17/2019 1430 by Glenard Haring, RN Outcome: Progressing   Problem: Skin Integrity: Goal: Risk for impaired skin integrity will decrease 07/17/2019 1431 by Glenard Haring, RN Outcome: Progressing 07/17/2019 1430 by Glenard Haring, RN Outcome: Progressing   Problem: Education: Goal: Knowledge of disease and its progression will improve 07/17/2019 1431 by Glenard Haring, RN Outcome: Progressing 07/17/2019 1430 by Glenard Haring, RN Outcome: Progressing Goal: Individualized Educational Video(s) 07/17/2019 1431 by Glenard Haring, RN Outcome: Progressing 07/17/2019 1430 by Glenard Haring, RN Outcome: Progressing   Problem: Fluid Volume: Goal: Compliance with measures to maintain balanced fluid volume will improve 07/17/2019 1431 by Glenard Haring, RN Outcome: Progressing 07/17/2019 1430 by Glenard Haring, RN Outcome: Progressing   Problem: Health Behavior/Discharge Planning: Goal: Ability to manage health-related needs will improve 07/17/2019 1431 by Glenard Haring,  RN Outcome: Progressing 07/17/2019 1430 by Glenard Haring, RN Outcome: Progressing   Problem: Nutritional: Goal: Ability to make healthy dietary choices will improve 07/17/2019 1431 by Glenard Haring, RN Outcome: Progressing 07/17/2019 1430 by Glenard Haring, RN Outcome: Progressing   Problem: Clinical Measurements: Goal: Complications related to the disease process, condition  or treatment will be avoided or minimized 07/17/2019 1431 by Glenard Haring, RN Outcome: Progressing 07/17/2019 1430 by Glenard Haring, RN Outcome: Progressing

## 2019-07-17 NOTE — Progress Notes (Signed)
PROGRESS NOTE  Ray Merritt BJY:[id number] DOB: March 19, 1936 DOA: 07/07/2019 PCP: [name], MD  HPI/Recap of past 52 hours: 83 year old gentleman with history of hypertension, chronic kidney disease stage IV nearing hemodialysis, coronary artery disease, recent abnormal stress test, type 2 diabetes and CLL presented to the emergency room with shortness of breath, cough with blood-tinged sputum since morning. He had some atypical chest pain. Recently with weight gain and extremity edema. He does have left AV arm AV fistula that has not been used yet. In the emergency room, febrile with temperature 100.6, blood pressures elevated, 2 L nasal cannula placed for comfort. WBC 16.8. Creatinine 6.61. EKG revealed ST depression in inferior lateral leads. Mildly elevated troponins. Chest x-ray with fluid in the fissures, suspected atelectasis versus pneumonia.COVID-19 point-of-care and PCR test negative.   Admitted for IV antibiotics and heparin infusion with cardiology and nephrology consult.  Acute blood loss anemia requiring 1 unit irradiated RBC 12/19.  Progressing to ESRD first HD on 07/09/2019, then 07/11/19.  Nephrology has outpatient HD spot - can DC as early as 12/26 after dialysis that day. Continues to have issues with dialysis access which may delay disposition.  07/17/19: Seen and examined.  Reports left knee effusion, stating it has happened in the past and last arthrocentesis was many years ago.  Declined further evaluation, requested to reassess tomorrow.   Assessment/Plan: Principal Problem:   Sepsis ([place]) Active Problems:   CLL (chronic lymphocytic leukemia) (HCC)   Type 2 diabetes, HbA1c goal < 7% (HCC)   Acute renal failure superimposed on chronic kidney disease (HCC)   Acute on chronic combined systolic and diastolic CHF (congestive heart failure) (HCC)   Hypertensive urgency   Urinary tract infection   Abnormal EKG   Elevated troponin  New ESRD on  HD - First HD on 07/09/2019 -Hemodialysis per renal service. -Patient is set up for [name] on Monday Wednesday Friday schedule second shift at discharge. -Dialysis access malfunction, may need TDC or aVF prior to dc  Sepsis secondary to Enterococcous Faecalis community-acquired pneumonia, POA Concurrent E. coli UTI, likely POA -Urine culture grew greater than 100,000 colonies of E. coli, pansensitive. -Sputum culture grew abundant enterococous faecalis -Azithromycin/ceftriaxone discontinued, transition to Unasyn on 07/12/2019 x5 days. -Tentative stop date 07/17/19 for Unasyn -Chest x-ray personally reviewed -right middle lobe opacification, not noted on x-ray 07/10/2019 -Blood cultures continue to be preliminarily negative -Repeat CBC with differentials in the morning  Anemia of chronic disease complicated by iron deficiency, ongoing -So far required 3 units PRBC transfusion, hemoglobin this morning 7.8, will transfuse 1 unit PRBC . - No sign of overt bleeding - Nephrology managing with ESA and iron repletion.  Left knee effusion, recurrent Reports left knee effusion, stating it has happened in the past and last arthrocentesis was many years ago.  Declined further evaluation, requested to reassess tomorrow.  Worsening hypervolemic hyponatremia Management with dialysis Repeat BMP in the morning  Volume overload in the setting of new ESRD Management Per nephrology/dialysis, improving with hemodialysis  NSTEMI Followed by cardiology On aspirin and Lipitor Continue cardiac medications as recommended by cardiology.  Paroxysmal A. fib CHA2DS2-VASc score 6 Anticoagulate with Coumadin per cardiology Continue warfarin, managed by pharmacy INR therapeutic 2.4.  BPH Stable Continue home medications Continue to monitor urine output  Acute on chronic combined systolic and diastolic CHF Continue strict I's and O's and daily weight Continue cardiac medications Volume  managed by hemodialysis and IV Lasix as needed for volume overload Received  60 mg IV Lasix 1 today  Essential hypertension Likely will need to reduce doses of antihypertensives Continue to closely monitor vital signs Continue clonidine at lower dose 0.1 mg twice daily Hold off Coreg and hydralazine to give room for diuresing as needed before hemodialysis  CLL Will need to follow-up outpatient  Physical debility, ambulatory dysfunction PT assessment recommended SNF.  Patient desires to DC to home with family's assistance.  DME: [facility], hospital bed, wheelchair. Continue PT OT with assistance and fall precautions.  DVT prophylaxis: Coumadin Code Status: DNR  Family Communication:None  Disposition Plan:Pending clinical improvement, nephrology and cardiology signing off.  Consultants:  Cardiology, Dr. Einar Gip  Nephrology, Dr. Moshe Cipro  Interventional radiology  Procedures:  None  Antimicrobials:  Rocephin, azithromycin, 07/07/2019--- 07/11/19  Unasyn 07/11/2019---ongoing -tentative stop date 07/16/2019    Objective: Vitals:   07/16/19 2043 07/16/19 2355 07/17/19 0449 07/17/19 0814  BP: 138/71 (!) 144/50 (!) 146/48 (!) 152/50  Pulse: 69 65 63 65  Resp: 15 17 17 17   Temp: 98.5 F (36.9 C) 98 F (36.7 C) 98.3 F (36.8 C) 98.9 F (37.2 C)  TempSrc: Oral Oral Oral Oral  SpO2: 90% 94% 97% 98%  Weight:   98.6 kg   Height:        Intake/Output Summary (Last 24 hours) at 07/17/2019 1008 Last data filed at 07/17/2019 1000 Gross per 24 hour  Intake 1040 ml  Output 3080 ml  Net -2040 ml   Filed Weights   07/16/19 0700 07/16/19 1115 07/17/19 0449  Weight: 100 kg 98 kg 98.6 kg    Exam:  . General: 83 y.o. year-old male well developed well nourished in no acute distress.  Alert and oriented x3. . Cardiovascular: Regular rate and rhythm with no rubs or gallops.  No thyromegaly or JVD noted.   Marland Kitchen Respiratory: Mild rales at bases bilaterally.  No  wheezing noted. Good inspiratory effort. . Abdomen: Soft nontender nondistended with normal bowel sounds x4 quadrants. . Musculoskeletal: Left knee effusion without significant tenderness on palpation. Marland Kitchen Psychiatry: Mood is appropriate for condition and setting   Data Reviewed: CBC: Recent Labs  Lab 07/12/19 0327 07/13/19 0316 07/14/19 0254 07/15/19 0446 07/16/19 0534  WBC 24.2* 21.4* 21.7* 20.8* 23.1*  HGB 7.8* 7.2* 6.6* 7.7* 7.8*  HCT 22.6* 21.8* 19.6* 22.6* 23.3*  MCV 93.0 96.5 94.7 91.5 94.0  PLT 270 261 294 265 454   Basic Metabolic Panel: Recent Labs  Lab 07/11/19 0319 07/12/19 0327 07/13/19 0316 07/14/19 0254 07/16/19 0534  NA 134* 132* 131* 128* 134*  K 4.1 4.1 4.3 4.5 3.8  CL 101 96* 96* 92* 97*  CO2 17* 21* 18* 17* 23  GLUCOSE 155* 187* 189* 161* 200*  BUN 82* 72* 105* 118* 100*  CREATININE 5.38* 5.40* 6.46* 6.81* 5.89*  CALCIUM 8.9 8.9 8.7* 8.8* 8.4*  PHOS 5.0*  --   --   --  6.7*   GFR: Estimated Creatinine Clearance: 11.8 mL/min (A) (by C-G formula based on SCr of 5.89 mg/dL (H)). Liver Function Tests: Recent Labs  Lab 07/11/19 0319 07/16/19 0534  ALBUMIN 2.5* 2.0*   No results for input(s): LIPASE, AMYLASE in the last 168 hours. No results for input(s): AMMONIA in the last 168 hours. Coagulation Profile: Recent Labs  Lab 07/13/19 0316 07/14/19 0254 07/15/19 0446 07/16/19 0534 07/17/19 0355  INR 1.8* 2.2* 2.7* 3.4* 2.4*   Cardiac Enzymes: No results for input(s): CKTOTAL, CKMB, CKMBINDEX, TROPONINI in the last 168 hours. BNP (last 3 results)  No results for input(s): PROBNP in the last 8760 hours. HbA1C: No results for input(s): HGBA1C in the last 72 hours. CBG: Recent Labs  Lab 07/16/19 1250 07/16/19 1620 07/16/19 2127 07/17/19 0608 07/17/19 0840  GLUCAP 133* 198* 247* 178* 159*   Lipid Profile: No results for input(s): CHOL, HDL, LDLCALC, TRIG, CHOLHDL, LDLDIRECT in the last 72 hours. Thyroid Function Tests: No results for  input(s): TSH, T4TOTAL, FREET4, T3FREE, THYROIDAB in the last 72 hours. Anemia Panel: No results for input(s): VITAMINB12, FOLATE, FERRITIN, TIBC, IRON, RETICCTPCT in the last 72 hours. Urine analysis:    Component Value Date/Time   COLORURINE YELLOW 07/07/2019 1409   APPEARANCEUR HAZY (A) 07/07/2019 1409   LABSPEC 1.014 07/07/2019 1409   PHURINE 5.0 07/07/2019 1409   GLUCOSEU NEGATIVE 07/07/2019 1409   HGBUR NEGATIVE 07/07/2019 1409   BILIRUBINUR NEGATIVE 07/07/2019 1409   KETONESUR NEGATIVE 07/07/2019 1409   PROTEINUR 100 (A) 07/07/2019 1409   UROBILINOGEN 0.2 10/07/2011 1203   NITRITE NEGATIVE 07/07/2019 1409   LEUKOCYTESUR SMALL (A) 07/07/2019 1409   Sepsis Labs: @LABRCNTIP (procalcitonin:4,lacticidven:4)  ) Recent Results (from the past 240 hour(s))  Blood Culture (routine x 2)     Status: None   Collection Time: 07/07/19 10:57 AM   Specimen: BLOOD RIGHT ARM  Result Value Ref Range Status   Specimen Description BLOOD RIGHT ARM  Final   Special Requests   Final    BOTTLES DRAWN AEROBIC AND ANAEROBIC Blood Culture adequate volume   Culture   Final    NO GROWTH 5 DAYS Performed at [facility], [place] [address], [place], [place] [address]    Report Status 07/12/2019 FINAL  Final  Blood Culture (routine x 2)     Status: None   Collection Time: 07/07/19 11:49 AM   Specimen: BLOOD RIGHT FOREARM  Result Value Ref Range Status   Specimen Description BLOOD RIGHT FOREARM  Final   Special Requests   Final    BOTTLES DRAWN AEROBIC ONLY Blood Culture adequate volume   Culture   Final    NO GROWTH 5 DAYS Performed at [facility], [place] [address] [place],  [address]    Report Status 07/12/2019 FINAL  Final  Respiratory Panel by RT PCR (Flu A&B, Covid) - Nasopharyngeal Swab     Status: None   Collection Time: 07/07/19  1:47 PM   Specimen: Nasopharyngeal Swab  Result Value Ref Range Status   SARS Coronavirus 2 by RT PCR NEGATIVE NEGATIVE Final    Comment:  (NOTE) SARS-CoV-2 target nucleic acids are NOT DETECTED. The SARS-CoV-2 RNA is generally detectable in upper respiratoy specimens during the acute phase of infection. The lowest concentration of SARS-CoV-2 viral copies this assay can detect is 131 copies/mL. A negative result does not preclude SARS-Cov-2 infection and should not be used as the sole basis for treatment or other patient management decisions. A negative result may occur with  improper specimen collection/handling, submission of specimen other than nasopharyngeal swab, presence of viral mutation(s) within the areas targeted by this assay, and inadequate number of viral copies (<131 copies/mL). A negative result must be combined with clinical observations, patient history, and epidemiological information. The expected result is Negative. Fact Sheet for Patients:  PinkCheek.be Fact Sheet for Healthcare Providers:  GravelBags.it This test is not yet ap proved or cleared by the [place] FDA and  has been authorized for detection and/or diagnosis of SARS-CoV-2 by FDA under an Emergency Use Authorization (EUA). This EUA will  remain  in effect (meaning this test can be used) for the duration of the COVID-19 declaration under Section 564(b)(1) of the Act, 21 U.S.C. section 360bbb-3(b)(1), unless the authorization is terminated or revoked sooner.    Influenza A by PCR NEGATIVE NEGATIVE Final   Influenza B by PCR NEGATIVE NEGATIVE Final    Comment: (NOTE) The Xpert Xpress SARS-CoV-2/FLU/RSV assay is intended as an aid in  the diagnosis of influenza from Nasopharyngeal swab specimens and  should not be used as a sole basis for treatment. Nasal washings and  aspirates are unacceptable for Xpert Xpress SARS-CoV-2/FLU/RSV  testing. Fact Sheet for Patients: PinkCheek.be Fact Sheet for Healthcare  Providers: GravelBags.it This test is not yet approved or cleared by the [place] FDA and  has been authorized for detection and/or diagnosis of SARS-CoV-2 by  FDA under an Emergency Use Authorization (EUA). This EUA will remain  in effect (meaning this test can be used) for the duration of the  Covid-19 declaration under Section 564(b)(1) of the Act, 21  U.S.C. section 360bbb-3(b)(1), unless the authorization is  terminated or revoked. Performed at [facility], [place] [address], [place], [place] [address]   Respiratory Panel by PCR     Status: None   Collection Time: 07/07/19  1:47 PM   Specimen: Nasopharyngeal Swab; Respiratory  Result Value Ref Range Status   Adenovirus NOT DETECTED NOT DETECTED Final   Coronavirus 229E NOT DETECTED NOT DETECTED Final    Comment: (NOTE) The Coronavirus on the Respiratory Panel, DOES NOT test for the novel  Coronavirus (2019 nCoV)    Coronavirus HKU1 NOT DETECTED NOT DETECTED Final   Coronavirus NL63 NOT DETECTED NOT DETECTED Final   Coronavirus OC43 NOT DETECTED NOT DETECTED Final   Metapneumovirus NOT DETECTED NOT DETECTED Final   Rhinovirus / Enterovirus NOT DETECTED NOT DETECTED Final   Influenza A NOT DETECTED NOT DETECTED Final   Influenza B NOT DETECTED NOT DETECTED Final   Parainfluenza Virus 1 NOT DETECTED NOT DETECTED Final   Parainfluenza Virus 2 NOT DETECTED NOT DETECTED Final   Parainfluenza Virus 3 NOT DETECTED NOT DETECTED Final   Parainfluenza Virus 4 NOT DETECTED NOT DETECTED Final   Respiratory Syncytial Virus NOT DETECTED NOT DETECTED Final   Bordetella pertussis NOT DETECTED NOT DETECTED Final   Chlamydophila pneumoniae NOT DETECTED NOT DETECTED Final   Mycoplasma pneumoniae NOT DETECTED NOT DETECTED Final    Comment: Performed at [facility], [place]. [address], [place], [place] [address]  Urine culture     Status: Abnormal   Collection Time: 07/07/19  2:13 PM   Specimen: Urine,  Random  Result Value Ref Range Status   Specimen Description URINE, RANDOM  Final   Special Requests   Final    NONE Performed at [facility], [place] [address] [place], [place] [address]    Culture >=100,000 COLONIES/mL ESCHERICHIA COLI (A)  Final   Report Status 07/09/2019 FINAL  Final   Organism ID, Bacteria ESCHERICHIA COLI (A)  Final      Susceptibility   Escherichia coli - MIC*    AMPICILLIN 8 SENSITIVE Sensitive     CEFAZOLIN <=4 SENSITIVE Sensitive     CEFTRIAXONE <=1 SENSITIVE Sensitive     CIPROFLOXACIN <=0.25 SENSITIVE Sensitive     GENTAMICIN <=1 SENSITIVE Sensitive     IMIPENEM <=0.25 SENSITIVE Sensitive     NITROFURANTOIN <=16 SENSITIVE Sensitive     TRIMETH/SULFA <=20 SENSITIVE Sensitive     AMPICILLIN/SULBACTAM  4 SENSITIVE Sensitive     PIP/TAZO <=4 SENSITIVE Sensitive     * >=100,000 COLONIES/mL ESCHERICHIA COLI  Expectorated sputum assessment w rflx to resp cult     Status: None   Collection Time: 07/09/19  1:26 AM   Specimen: SPU  Result Value Ref Range Status   Specimen Description SPUTUM  Final   Special Requests NONE  Final   Sputum evaluation   Final    THIS SPECIMEN IS ACCEPTABLE FOR SPUTUM CULTURE Performed at [facility], [place] [address], [place], [place] [address]    Report Status 07/09/2019 FINAL  Final  Culture, respiratory     Status: None   Collection Time: 07/09/19  1:26 AM   Specimen: SPU  Result Value Ref Range Status   Specimen Description SPUTUM  Final   Special Requests NONE Reflexed from [id number]  Final   Gram Stain   Final    MODERATE WBC PRESENT, PREDOMINANTLY PMN FEW GRAM POSITIVE COCCI IN CHAINS FEW GRAM NEGATIVE RODS    Culture   Final    Consistent with normal respiratory flora. Performed at [facility], [place] [address], [place], [place] [address]    Report Status 07/11/2019 FINAL  Final  MRSA PCR Screening     Status: None   Collection Time: 07/11/19  6:35 AM   Specimen: Nasal Mucosa; Nasopharyngeal   Result Value Ref Range Status   MRSA by PCR NEGATIVE NEGATIVE Final    Comment:        The GeneXpert MRSA Assay (FDA approved for NASAL specimens only), is one component of a comprehensive MRSA colonization surveillance program. It is not intended to diagnose MRSA infection nor to guide or monitor treatment for MRSA infections. Performed at [facility], [place] [address], [place], [place] [address]   Culture, blood (routine x 2)     Status: None   Collection Time: 07/11/19  7:30 AM   Specimen: BLOOD RIGHT HAND  Result Value Ref Range Status   Specimen Description BLOOD RIGHT HAND  Final   Special Requests   Final    BOTTLES DRAWN AEROBIC ONLY Blood Culture results may not be optimal due to an inadequate volume of blood received in culture bottles   Culture   Final    NO GROWTH 5 DAYS Performed at [facility], [place] [address] [place], [place] [address]    Report Status 07/16/2019 FINAL  Final  Culture, blood (routine x 2)     Status: None   Collection Time: 07/11/19  7:35 AM   Specimen: BLOOD RIGHT HAND  Result Value Ref Range Status   Specimen Description BLOOD RIGHT HAND  Final   Special Requests   Final    BOTTLES DRAWN AEROBIC ONLY Blood Culture results may not be optimal due to an inadequate volume of blood received in culture bottles   Culture   Final    NO GROWTH 5 DAYS Performed at [facility], [place] [address] [place], [place] [address]    Report Status 07/16/2019 FINAL  Final  Expectorated sputum assessment w rflx to resp cult     Status: None   Collection Time: 07/11/19  9:19 AM   Specimen: Sputum  Result Value Ref Range Status   Specimen Description SPUTUM  Final   Special Requests Immunocompromised  Final   Sputum evaluation   Final    THIS SPECIMEN IS ACCEPTABLE FOR SPUTUM CULTURE Performed at [facility], [address] [place], [place] [address]    Report Status 07/11/2019 FINAL  Final  Culture, respiratory     Status: None    Collection Time: 07/11/19  9:19 AM   Specimen: SPU  Result Value Ref Range Status   Specimen Description SPUTUM  Final   Special Requests Immunocompromised Reflexed from [id number]  Final   Gram Stain   Final    RARE WBC PRESENT, PREDOMINANTLY PMN FEW GRAM POSITIVE COCCI IN CLUSTERS RARE YEAST Performed at [facility], [place] [address], [place], [place] [address]    Culture ABUNDANT ENTEROCOCCUS FAECALIS  Final   Report Status 07/13/2019 FINAL  Final   Organism ID, Bacteria ENTEROCOCCUS FAECALIS  Final      Susceptibility   Enterococcus faecalis - MIC*    AMPICILLIN <=2 SENSITIVE Sensitive     VANCOMYCIN 2 SENSITIVE Sensitive     GENTAMICIN SYNERGY SENSITIVE Sensitive     * ABUNDANT ENTEROCOCCUS FAECALIS      Studies: No results found.  Scheduled Meds: . sodium chloride   Intravenous Once  . sodium chloride   Intravenous Once  . aspirin EC  81 mg Oral QHS  . atorvastatin  20 mg Oral Daily  . calcium acetate  1,334 mg Oral TID WC  . carvedilol  25 mg Oral BID WC  . Chlorhexidine Gluconate Cloth  6 each Topical Q0600  . cloNIDine  0.1 mg Oral BID  . darbepoetin (ARANESP) injection - DIALYSIS  150 mcg Intravenous Q Sat-HD  . finasteride  5 mg Oral QPM  . hydrALAZINE  100 mg Oral TID  . insulin aspart  0-9 Units Subcutaneous TID WC  . ipratropium-albuterol  3 mL Nebulization BID  . isosorbide dinitrate  30 mg Oral TID  . LORazepam  0.5 mg Intravenous Once  . multivitamin  1 tablet Oral QHS  . ranolazine  1,000 mg Oral BID  . tamsulosin  0.4 mg Oral QPC supper  . Warfarin - Pharmacist Dosing Inpatient   Does not apply q1800    Continuous Infusions: . ampicillin-sulbactam (UNASYN) IV 3 g (07/17/19 0512)     LOS: 10 days     Kayleen Memos, MD [facility] Pager [phone]  If 7PM-7AM, please contact night-coverage [email or web address] Password [email or web address] 07/17/2019, 10:08 AM

## 2019-07-18 ENCOUNTER — Other Ambulatory Visit: Payer: Self-pay | Admitting: Endocrinology

## 2019-07-18 LAB — BASIC METABOLIC PANEL
Anion gap: 14 (ref 5–15)
BUN: 87 mg/dL — ABNORMAL HIGH (ref 8–23)
CO2: 22 mmol/L (ref 22–32)
Calcium: 8.6 mg/dL — ABNORMAL LOW (ref 8.9–10.3)
Chloride: 93 mmol/L — ABNORMAL LOW (ref 98–111)
Creatinine, Ser: 5.95 mg/dL — ABNORMAL HIGH (ref 0.61–1.24)
GFR calc Af Amer: 9 mL/min — ABNORMAL LOW (ref >60)
GFR calc non Af Amer: 8 mL/min — ABNORMAL LOW (ref >60)
Glucose, Bld: 190 mg/dL — ABNORMAL HIGH (ref 70–99)
Potassium: 4.2 mmol/L (ref 3.5–5.1)
Sodium: 129 mmol/L — ABNORMAL LOW (ref 135–145)

## 2019-07-18 LAB — GLUCOSE, CAPILLARY
Glucose-Capillary: 112 mg/dL — ABNORMAL HIGH (ref 70–99)
Glucose-Capillary: 176 mg/dL — ABNORMAL HIGH (ref 70–99)
Glucose-Capillary: 168 mg/dL — ABNORMAL HIGH (ref 70–99)
Glucose-Capillary: 240 mg/dL — ABNORMAL HIGH (ref 70–99)
Glucose-Capillary: 278 mg/dL — ABNORMAL HIGH (ref 70–99)

## 2019-07-18 LAB — CBC WITH DIFFERENTIAL/PLATELET
Abs Immature Granulocytes: 0.54 10*3/uL — ABNORMAL HIGH (ref 0.00–0.07)
Basophils Absolute: 0.1 10*3/uL (ref 0.0–0.1)
Basophils Relative: 0 %
Eosinophils Absolute: 0.4 10*3/uL (ref 0.0–0.5)
Eosinophils Relative: 2 %
HCT: 27 % — ABNORMAL LOW (ref 39.0–52.0)
Hemoglobin: 9 g/dL — ABNORMAL LOW (ref 13.0–17.0)
Immature Granulocytes: 2 %
Lymphocytes Relative: 17 %
Lymphs Abs: 3.8 10*3/uL (ref 0.7–4.0)
MCH: 31.1 pg (ref 26.0–34.0)
MCHC: 33.3 g/dL (ref 30.0–36.0)
MCV: 93.4 fL (ref 80.0–100.0)
Monocytes Absolute: 2.1 10*3/uL — ABNORMAL HIGH (ref 0.1–1.0)
Monocytes Relative: 10 %
Neutro Abs: 15.7 10*3/uL — ABNORMAL HIGH (ref 1.7–7.7)
Neutrophils Relative %: 69 %
Platelets: 363 10*3/uL (ref 150–400)
RBC: 2.89 MIL/uL — ABNORMAL LOW (ref 4.22–5.81)
RDW: 16.9 % — ABNORMAL HIGH (ref 11.5–15.5)
WBC: 22.5 10*3/uL — ABNORMAL HIGH (ref 4.0–10.5)
nRBC: 0.2 % (ref 0.0–0.2)

## 2019-07-18 LAB — PROTIME-INR
INR: 2.8 — ABNORMAL HIGH (ref 0.8–1.2)
Prothrombin Time: 29.5 seconds — ABNORMAL HIGH (ref 11.4–15.2)

## 2019-07-18 LAB — PROCALCITONIN: Procalcitonin: 1.88 ng/mL

## 2019-07-18 LAB — LACTIC ACID, PLASMA: Lactic Acid, Venous: 1 mmol/L (ref 0.5–1.9)

## 2019-07-18 MED ORDER — ALTEPLASE 2 MG IJ SOLR: 2.0000 mg | Freq: Once | INTRAMUSCULAR | Status: DC | PRN

## 2019-07-18 MED ORDER — SODIUM CHLORIDE 0.9 % IV SOLN: 100.0000 mL | INTRAVENOUS | Status: DC | PRN

## 2019-07-18 MED ORDER — HEPARIN SODIUM (PORCINE) 1000 UNIT/ML IJ SOLN
INTRAMUSCULAR | Status: AC
  Administered 2019-07-18: 2800 [IU] via INTRAVENOUS_CENTRAL
  Filled 2019-07-18: qty 3

## 2019-07-18 MED ORDER — PENTAFLUOROPROP-TETRAFLUOROETH EX AERO: 1.0000 "application " | INHALATION_SPRAY | CUTANEOUS | Status: DC | PRN

## 2019-07-18 MED ORDER — HEPARIN SODIUM (PORCINE) 1000 UNIT/ML DIALYSIS: 1000.0000 [IU] | INTRAMUSCULAR | Status: DC | PRN

## 2019-07-18 MED ORDER — LIDOCAINE-PRILOCAINE 2.5-2.5 % EX CREA
1.0000 "application " | TOPICAL_CREAM | CUTANEOUS | Status: DC | PRN
  Filled 2019-07-18: qty 5

## 2019-07-18 NOTE — Progress Notes (Signed)
PT Cancellation Note  Patient Details Name: Ray Merritt MRN: [id number] DOB: 1935/10/01   Cancelled Treatment:    Reason Eval/Treat Not Completed: Patient at procedure or test/unavailable Pt off floor getting HD catheter conversion. Will follow.   [name] 07/18/2019, 11:58 AM

## 2019-07-18 NOTE — Procedures (Addendum)
I was present at this dialysis session. I have reviewed the session itself and made appropriate changes.   Too much swelling in arm for cannulation today.  He will need TDC and more long term rest of AVF.    K 4.2 on 2K bath.    RA BP stable.  Goal UF 4L BP permitting, stable currently.    Daughter Threasa Beards updated on status/plans.   Filed Weights   07/17/19 0449 07/18/19 0333 07/18/19 0721  Weight: 98.6 kg 101.8 kg 99.4 kg    Recent Labs  Lab 07/16/19 0534 07/18/19 0400  NA 134* 129*  K 3.8 4.2  CL 97* 93*  CO2 23 22  GLUCOSE 200* 190*  BUN 100* 87*  CREATININE 5.89* 5.95*  CALCIUM 8.4* 8.6*  PHOS 6.7*  --     Recent Labs  Lab 07/15/19 0446 07/16/19 0534 07/18/19 0400  WBC 20.8* 23.1* 22.5*  NEUTROABS  --   --  15.7*  HGB 7.7* 7.8* 9.0*  HCT 22.6* 23.3* 27.0*  MCV 91.5 94.0 93.4  PLT 265 296 363    Scheduled Meds: . sodium chloride   Intravenous Once  . sodium chloride   Intravenous Once  . aspirin EC  81 mg Oral QHS  . atorvastatin  20 mg Oral Daily  . calcium acetate  1,334 mg Oral TID WC  . carvedilol  25 mg Oral BID WC  . Chlorhexidine Gluconate Cloth  6 each Topical Q0600  . cloNIDine  0.1 mg Oral BID  . darbepoetin (ARANESP) injection - DIALYSIS  150 mcg Intravenous Q Sat-HD  . finasteride  5 mg Oral QPM  . hydrALAZINE  100 mg Oral TID  . insulin aspart  0-9 Units Subcutaneous TID WC  . ipratropium-albuterol  3 mL Nebulization BID  . isosorbide dinitrate  30 mg Oral TID  . LORazepam  0.5 mg Intravenous Once  . multivitamin  1 tablet Oral QHS  . ranolazine  1,000 mg Oral BID  . tamsulosin  0.4 mg Oral QPC supper  . Warfarin - Pharmacist Dosing Inpatient   Does not apply q1800   Continuous Infusions: . sodium chloride    . sodium chloride    . ampicillin-sulbactam (UNASYN) IV 3 g (07/18/19 0505)   PRN Meds:.sodium chloride, sodium chloride, acetaminophen **OR** acetaminophen, alteplase, heparin, lidocaine, lidocaine-prilocaine, ondansetron **OR**  ondansetron (ZOFRAN) IV, pentafluoroprop-tetrafluoroeth   Pearson Grippe  MD 07/18/2019, 9:07 AM

## 2019-07-18 NOTE — Progress Notes (Signed)
IR requested by Dr. Joelyn Oms for possible nontunneled to tunneled HD catheter conversion.  Discussed case with patient's RN who states patient currently has nontunneled right IJ HD catheter that is functioning properly.   Reviewed patient's AM labs- WBCs elevated to 22.5 and INR elevated to 2.8. Patient currently on Coumadin- last dose 07/17/2019. In order for procedure to occur, Coumadin must be held so that INR is less than 1.5. In addition, WBCs must also be trending down/closer to normal limits. Discussed case with Dr. Joelyn Oms who is aware. Will follow INR/WBCs and plan for possible procedure later this week.  IR to follow.   Bea Graff Cline Draheim, PA-C 07/18/2019, 11:31 AM

## 2019-07-18 NOTE — Progress Notes (Signed)
PROGRESS NOTE  Ray Merritt XTG:[id number] DOB: [birth year] DOA: 07/07/2019 PCP: [name], MD  HPI/Recap of past 34 hours: 83 year old gentleman with history of hypertension, chronic kidney disease stage IV nearing hemodialysis, coronary artery disease, recent abnormal stress test, type 2 diabetes and CLL presented to the emergency room with shortness of breath, cough with blood-tinged sputum since morning. He had some atypical chest pain. Recently with weight gain and extremity edema. He does have left AV arm AV fistula that has not been used yet. In the emergency room, febrile with temperature 100.6, blood pressures elevated, 2 L nasal cannula placed for comfort. WBC 16.8. Creatinine 6.61. EKG revealed ST depression in inferior lateral leads. Mildly elevated troponins. Chest x-ray with fluid in the fissures, suspected atelectasis versus pneumonia.COVID-19 point-of-care and PCR test negative.   Admitted for IV antibiotics and heparin infusion with cardiology and nephrology consult.  Acute blood loss anemia requiring 1 unit irradiated RBC 12/19.  Progressing to ESRD first HD on 07/09/2019, then 07/11/19.  Nephrology has outpatient HD spot - can DC as early as 12/26 after dialysis that day. Continues to have issues with dialysis access which may delay disposition.  07/18/19: Seen by nephrology, will need TDC.  Seen and examined after hemodialysis.  He has no new complaints and agrees with plan of care.  He would like to go home once stable.   Assessment/Plan: Principal Problem:   Sepsis ([place]) Active Problems:   CLL (chronic lymphocytic leukemia) (HCC)   Type 2 diabetes, HbA1c goal < 7% (HCC)   Acute renal failure superimposed on chronic kidney disease (HCC)   Acute on chronic combined systolic and diastolic CHF (congestive heart failure) (HCC)   Hypertensive urgency   Urinary tract infection   Abnormal EKG   Elevated troponin  New ESRD on HD - First HD on  07/09/2019 -Hemodialysis per renal service. -Patient is set up for [name] on Monday Wednesday Friday schedule second shift at discharge. -Dialysis access malfunction, will need [facility] per nephrology.  Sepsis secondary to Enterococcous Faecalis community-acquired pneumonia, POA Concurrent E. coli UTI, likely POA -Urine culture grew greater than 100,000 colonies of E. coli, pansensitive. -Sputum culture grew abundant enterococous faecalis -Azithromycin/ceftriaxone discontinued, transition to Unasyn on 07/12/2019 x6/7 days. -Tentative stop date 07/19/19 for Unasyn -Chest x-ray personally reviewed -right middle lobe opacification, not noted on x-ray 07/10/2019 Blood cultures taken on 07/11/2019 - final. WBC is trending down, afebrile Continue to follow.  Anemia of chronic disease complicated by iron deficiency, ongoing -So far required 3 units PRBC transfusion Hemoglobin trending up 9.0 - No sign of overt bleeding - Nephrology managing with ESA and iron repletion.  Left knee effusion, recurrent Reports left knee effusion, stating it has happened in the past and last arthrocentesis was many years ago.  Declined further evaluation.  Continues to decline further evaluation.  Worsening hypervolemic hyponatremia Sodium 129 from 134 Management with hemodialysis Continue daily BMPs  Volume overload in the setting of new ESRD Management Per nephrology/dialysis, improving with hemodialysis  NSTEMI Followed by cardiology On aspirin and Lipitor Continue cardiac medications as recommended by cardiology.  Paroxysmal A. fib CHA2DS2-VASc score 6 Anticoagulate with Coumadin per cardiology Continue warfarin, managed by pharmacy INR therapeutic 2.8  BPH Stable Continue home medications: Tamsulosin and finasteride  Acute on chronic combined systolic and diastolic CHF Continue strict I's and O's and daily weight Continue cardiac medications Volume managed by hemodialysis and IV  Lasix as needed for volume overload  Essential hypertension Blood  pressure is at goal. Continue to closely monitor vital signs Continue clonidine at lower dose 0.1 mg twice daily, Coreg 25 mg twice daily, p.o. hydralazine 100 mg 3 times daily, isosorbide dinitrate 30 mg 3 times daily.  CLL Will need to follow-up outpatient  Physical debility, ambulatory dysfunction PT assessment recommended SNF.  Patient desires to DC to home with family's assistance.  DME: [facility] R[facility], hospital bed, wheelchair. Continue PT OT with assistance and fall precautions.  DVT prophylaxis: Coumadin Code Status: DNR  Family Communication:None  Disposition Plan:Pending clinical improvement, nephrology and cardiology signing off.  Consultants:  Cardiology, Dr. Einar Gip  Nephrology, Dr. Moshe Cipro  Interventional radiology  Procedures:  None  Antimicrobials:  Rocephin, azithromycin, 07/07/2019--- 07/11/19  Unasyn 07/11/2019---ongoing -tentative stop date 07/16/2019    Objective: Vitals:   07/18/19 0900 07/18/19 0930 07/18/19 1000 07/18/19 1030  BP: (!) 145/58 (!) 159/65 (!) 133/54 (!) 145/53  Pulse: 68 70 67 68  Resp:      Temp:      TempSrc:      SpO2:      Weight:      Height:        Intake/Output Summary (Last 24 hours) at 07/18/2019 1050 Last data filed at 07/18/2019 0400 Gross per 24 hour  Intake 817 ml  Output --  Net 817 ml   Filed Weights   07/17/19 0449 07/18/19 0333 07/18/19 0721  Weight: 98.6 kg 101.8 kg 99.4 kg    Exam:  . General: 83 y.o. year-old male frail-appearing no acute distress.  Alert oriented x3.  Cardiovascular: Regular rate and rhythm no rubs or gallops. Marland Kitchen Respiratory: Clear to auscultation.  No wheezes or rales.  Good respiratory effort.   . Abdomen: Soft nontender nondistended normal bowel sounds present.  . Musculoskeletal: Left knee effusion present but improving.  Nontender on palpation.   Marland Kitchen Psychiatry: Mood is appropriate for condition  and setting.   Data Reviewed: CBC: Recent Labs  Lab 07/13/19 0316 07/14/19 0254 07/15/19 0446 07/16/19 0534 07/18/19 0400  WBC 21.4* 21.7* 20.8* 23.1* 22.5*  NEUTROABS  --   --   --   --  15.7*  HGB 7.2* 6.6* 7.7* 7.8* 9.0*  HCT 21.8* 19.6* 22.6* 23.3* 27.0*  MCV 96.5 94.7 91.5 94.0 93.4  PLT 261 294 265 296 557   Basic Metabolic Panel: Recent Labs  Lab 07/12/19 0327 07/13/19 0316 07/14/19 0254 07/16/19 0534 07/18/19 0400  NA 132* 131* 128* 134* 129*  K 4.1 4.3 4.5 3.8 4.2  CL 96* 96* 92* 97* 93*  CO2 21* 18* 17* 23 22  GLUCOSE 187* 189* 161* 200* 190*  BUN 72* 105* 118* 100* 87*  CREATININE 5.40* 6.46* 6.81* 5.89* 5.95*  CALCIUM 8.9 8.7* 8.8* 8.4* 8.6*  PHOS  --   --   --  6.7*  --    GFR: Estimated Creatinine Clearance: 11.6 mL/min (A) (by C-G formula based on SCr of 5.95 mg/dL (H)). Liver Function Tests: Recent Labs  Lab 07/16/19 0534  ALBUMIN 2.0*   No results for input(s): LIPASE, AMYLASE in the last 168 hours. No results for input(s): AMMONIA in the last 168 hours. Coagulation Profile: Recent Labs  Lab 07/14/19 0254 07/15/19 0446 07/16/19 0534 07/17/19 0355 07/18/19 0400  INR 2.2* 2.7* 3.4* 2.4* 2.8*   Cardiac Enzymes: No results for input(s): CKTOTAL, CKMB, CKMBINDEX, TROPONINI in the last 168 hours. BNP (last 3 results) No results for input(s): PROBNP in the last 8760 hours. HbA1C: No results for input(s): HGBA1C  in the last 72 hours. CBG: Recent Labs  Lab 07/17/19 0840 07/17/19 1143 07/17/19 1658 07/17/19 2124 07/18/19 0623  GLUCAP 159* 183* 152* 219* 176*   Lipid Profile: No results for input(s): CHOL, HDL, LDLCALC, TRIG, CHOLHDL, LDLDIRECT in the last 72 hours. Thyroid Function Tests: No results for input(s): TSH, T4TOTAL, FREET4, T3FREE, THYROIDAB in the last 72 hours. Anemia Panel: No results for input(s): VITAMINB12, FOLATE, FERRITIN, TIBC, IRON, RETICCTPCT in the last 72 hours. Urine analysis:    Component Value Date/Time    COLORURINE YELLOW 07/07/2019 1409   APPEARANCEUR HAZY (A) 07/07/2019 1409   LABSPEC 1.014 07/07/2019 1409   PHURINE 5.0 07/07/2019 1409   GLUCOSEU NEGATIVE 07/07/2019 1409   HGBUR NEGATIVE 07/07/2019 1409   BILIRUBINUR NEGATIVE 07/07/2019 1409   KETONESUR NEGATIVE 07/07/2019 1409   PROTEINUR 100 (A) 07/07/2019 1409   UROBILINOGEN 0.2 10/07/2011 1203   NITRITE NEGATIVE 07/07/2019 1409   LEUKOCYTESUR SMALL (A) 07/07/2019 1409   Sepsis Labs: @LABRCNTIP (procalcitonin:4,lacticidven:4)  ) Recent Results (from the past 240 hour(s))  Expectorated sputum assessment w rflx to resp cult     Status: None   Collection Time: 07/09/19  1:26 AM   Specimen: SPU  Result Value Ref Range Status   Specimen Description SPUTUM  Final   Special Requests NONE  Final   Sputum evaluation   Final    THIS SPECIMEN IS ACCEPTABLE FOR SPUTUM CULTURE Performed at [facility], [place] [address] [place], [place] [address]    Report Status 07/09/2019 FINAL  Final  Culture, respiratory     Status: None   Collection Time: 07/09/19  1:26 AM   Specimen: SPU  Result Value Ref Range Status   Specimen Description SPUTUM  Final   Special Requests NONE Reflexed from [id number]  Final   Gram Stain   Final    MODERATE WBC PRESENT, PREDOMINANTLY PMN FEW GRAM POSITIVE COCCI IN CHAINS FEW GRAM NEGATIVE RODS    Culture   Final    Consistent with normal respiratory flora. Performed at [facility], [place] [address], [place], [place] [address]    Report Status 07/11/2019 FINAL  Final  MRSA PCR Screening     Status: None   Collection Time: 07/11/19  6:35 AM   Specimen: Nasal Mucosa; Nasopharyngeal  Result Value Ref Range Status   MRSA by PCR NEGATIVE NEGATIVE Final    Comment:        The GeneXpert MRSA Assay (FDA approved for NASAL specimens only), is one component of a comprehensive MRSA colonization surveillance program. It is not intended to diagnose MRSA infection nor to guide or monitor treatment  for MRSA infections. Performed at [facility], [place] [address] [place], [place] [address]   Culture, blood (routine x 2)     Status: None   Collection Time: 07/11/19  7:30 AM   Specimen: BLOOD RIGHT HAND  Result Value Ref Range Status   Specimen Description BLOOD RIGHT HAND  Final   Special Requests   Final    BOTTLES DRAWN AEROBIC ONLY Blood Culture results may not be optimal due to an inadequate volume of blood received in culture bottles   Culture   Final    NO GROWTH 5 DAYS Performed at [facility], [place] [address], [place], [place] [address]    Report Status 07/16/2019 FINAL  Final  Culture, blood (routine x 2)     Status: None   Collection Time: 07/11/19  7:35 AM  Specimen: BLOOD RIGHT HAND  Result Value Ref Range Status   Specimen Description BLOOD RIGHT HAND  Final   Special Requests   Final    BOTTLES DRAWN AEROBIC ONLY Blood Culture results may not be optimal due to an inadequate volume of blood received in culture bottles   Culture   Final    NO GROWTH 5 DAYS Performed at [facility], [place] [address], [place], [place] [address]    Report Status 07/16/2019 FINAL  Final  Expectorated sputum assessment w rflx to resp cult     Status: None   Collection Time: 07/11/19  9:19 AM   Specimen: Sputum  Result Value Ref Range Status   Specimen Description SPUTUM  Final   Special Requests Immunocompromised  Final   Sputum evaluation   Final    THIS SPECIMEN IS ACCEPTABLE FOR SPUTUM CULTURE Performed at [facility], [place] [address], [place], [place] [address]    Report Status 07/11/2019 FINAL  Final  Culture, respiratory     Status: None   Collection Time: 07/11/19  9:19 AM   Specimen: SPU  Result Value Ref Range Status   Specimen Description SPUTUM  Final   Special Requests Immunocompromised Reflexed from [id number]  Final   Gram Stain   Final    RARE WBC PRESENT, PREDOMINANTLY PMN FEW GRAM POSITIVE COCCI IN CLUSTERS RARE YEAST Performed at [facility], [place] [address] [place], [place] [address]    Culture ABUNDANT ENTEROCOCCUS FAECALIS  Final   Report Status 07/13/2019 FINAL  Final   Organism ID, Bacteria ENTEROCOCCUS FAECALIS  Final      Susceptibility   Enterococcus faecalis - MIC*    AMPICILLIN <=2 SENSITIVE Sensitive     VANCOMYCIN 2 SENSITIVE Sensitive     GENTAMICIN SYNERGY SENSITIVE Sensitive     * ABUNDANT ENTEROCOCCUS FAECALIS      Studies: No results found.  Scheduled Meds: . sodium chloride   Intravenous Once  . sodium chloride   Intravenous Once  . aspirin EC  81 mg Oral QHS  . atorvastatin  20 mg Oral Daily  . calcium acetate  1,334 mg Oral TID WC  . carvedilol  25 mg Oral BID WC  . Chlorhexidine Gluconate Cloth  6 each Topical Q0600  . cloNIDine  0.1 mg Oral BID  . darbepoetin (ARANESP) injection - DIALYSIS  150 mcg Intravenous Q Sat-HD  . finasteride  5 mg Oral QPM  . hydrALAZINE  100 mg Oral TID  . insulin aspart  0-9 Units Subcutaneous TID WC  . ipratropium-albuterol  3 mL Nebulization BID  . isosorbide dinitrate  30 mg Oral TID  . LORazepam  0.5 mg Intravenous Once  . multivitamin  1 tablet Oral QHS  . ranolazine  1,000 mg Oral BID  . tamsulosin  0.4 mg Oral QPC supper  . Warfarin - Pharmacist Dosing Inpatient   Does not apply q1800    Continuous Infusions: . sodium chloride    . sodium chloride    . ampicillin-sulbactam (UNASYN) IV 3 g (07/18/19 0505)     LOS: 11 days     Kayleen Memos, MD [facility] Pager [phone]  If 7PM-7AM, please contact night-coverage [email or web address] Password [email or web address] 07/18/2019, 10:50 AM

## 2019-07-18 NOTE — TOC Initial Note (Signed)
Transition of Care (TOC) - Initial/Assessment Note  Marvetta Gibbons RN, BSN Transitions of Care Unit 4E- RN Case Manager [phone]   Patient Details  Name: Ray Merritt MRN: [id number] Date of Birth: 08-11-1935  Transition of Care [facility]) CM/SW Contact:    Dawayne Patricia, RN Phone Number: 07/18/2019, 2:21 PM  Clinical Narrative:                 Pt admitted with vol. Overload- chronic kidney disease now ESRD with new HD- pt has been clipped for an outpt HD chair. Per PT eval recommendations for rehab however pt and daughter prefer for pt to return home with [facility] services and do not wish for INPT or SNF rehab. Orders have been placed for DME needs- RW, w/c and hospital bed. - TC placed to pt's daughter Melonie- who confirmed plans to bring pt home- per [facility] they will need assistance with transportation home via PTAR- once pt is medically stable- will have w/c and hospital bed delivered to home- and RW to be delivered to pt's room for daughter to pick up when she visits and take home. Daughter agreeable to this plan. - also discussed [place] needs for "rehab" choice offered to daughter for any preferred [facility] agency- per daughter she would like to use [facility] for services - if they are unable she states she has no other preference. - Call made to [facility] with [facility] for [facility] referral- referral has been accepted- pt will need [place] orders placed prior to discharge.   Expected Discharge Plan: [place] Barriers to Discharge: Continued Medical Work up   Patient Goals and CMS Choice Patient states their goals for this hospitalization and ongoing recovery are:: return home and [place] at home CMS Medicare.gov Compare Post Acute Care list provided to:: Patient Represenative (must comment)(daughter) Choice offered to / list presented to : Adult Children(daughter)  Expected Discharge Plan and Services Expected Discharge Plan: [place]   Discharge Planning Services: CM  Consult Post Acute Care Choice: Durable Medical Equipment, Home Health Living arrangements for the past 2 months: [place]                 DME Arranged: Wheelchair manual, Walker rolling, Hospital bed DME Agency: [employer] Date DME Agency Contacted: 07/18/19 Time DME Agency Contacted: [id number] Representative spoke with at DME Agency: [place]: [place] ([place]) Date [place]: 07/18/19 Time [place]: 1400 Representative spoke with at [place]: [facility]ch Penny  Prior Living Arrangements/Services Living arrangements for the past 2 months: [place] Lives with:: Adult Children Patient language and need for interpreter reviewed:: Yes Do you feel safe going back to the place where you live?: Yes      Need for Family Participation in Patient Care: Yes (Comment) Care giver support system in place?: Yes (comment)   Criminal Activity/Legal Involvement Pertinent to Current Situation/Hospitalization: No - Comment as needed  Activities of Daily Living Home Assistive Devices/Equipment: None ADL Screening (condition at time of admission) Patient's cognitive ability adequate to safely complete daily activities?: Yes Is the patient deaf or have difficulty hearing?: Yes Does the patient have difficulty seeing, even when wearing glasses/contacts?: No Does the patient have difficulty concentrating, remembering, or making decisions?: No Patient able to express need for assistance with ADLs?: Yes Does the patient have difficulty dressing or bathing?: No Independently performs ADLs?: Yes (appropriate for developmental age) Does the patient have difficulty walking or  climbing stairs?: Yes Weakness of Legs: Both Weakness of Arms/Hands: None  Permission Sought/Granted Permission sought to share information with : Facility Art therapist granted to share information with : Yes, Verbal Permission Granted  Share Information with  NAME: Renee Pain  Permission granted to share info w AGENCY: [facility]ch Penny  Permission granted to share info w Relationship: daughter  Permission granted to share info w Contact Information: [phone]  Emotional Assessment Appearance:: Appears stated age Attitude/Demeanor/Rapport: Engaged Affect (typically observed): Appropriate Orientation: : Oriented to Self, Oriented to Place, Oriented to  Time, Oriented to Situation   Psych Involvement: No (comment)  Admission diagnosis:  SOB (shortness of breath) [R06.02] Sepsis ([place]) [A41.9] Urinary tract infection without hematuria, site unspecified [N39.0] Community acquired pneumonia, unspecified laterality [J18.9] Sepsis, due to unspecified organism, unspecified whether acute organ dysfunction present [facility]) [A41.9] Patient Active Problem List   Diagnosis Date Noted  . SOB (shortness of breath) 07/07/2019  . Acute on chronic combined systolic and diastolic CHF (congestive heart failure) ([place]) 07/07/2019  . Hypertensive urgency 07/07/2019  . Urinary tract infection 07/07/2019  . Abnormal EKG 07/07/2019  . Elevated troponin 07/07/2019  . Fever of unknown origin 08/08/2016  . Dyspnea   . Acute renal failure superimposed on chronic kidney disease ([place]) 04/02/2016  . Sepsis ([place]) 04/01/2016  . Type 2 diabetes, HbA1c goal < 7% (HCC)   . Fever 12/10/2015  . BPH (benign prostatic hyperplasia) 12/10/2015  . CAD (coronary artery disease), native coronary artery 05/21/2015  . Coronary artery disease involving coronary bypass graft 04/30/2015  . Angina pectoris associated with type 2 diabetes mellitus ([place]) 04/29/2015  . Type II diabetes mellitus, uncontrolled ([place]) 01/25/2015  . Chronic kidney disease (CKD) 01/25/2015  . Hypertension, essential, benign 01/25/2015  . Hyperlipidemia 01/25/2015  . CLL (chronic lymphocytic leukemia) ([place]) 03/02/2014  . Leukocytosis 01/27/2014   PCP:  [name], MD Pharmacy:   [facility]  [place], [place] - [place] AT [place] [address] [place] [address] Phone: [phone] Fax: [phone]     Social Determinants of Health (SDOH) Interventions    Readmission Risk Interventions No flowsheet data found.

## 2019-07-18 NOTE — Progress Notes (Signed)
   Durable Medical Equipment (From admission, onward)   Pt with New ESRD, CHF, requiring significant assistance for all functional mobility. Pt is generally weak throughout body.     Start     Ordered  07/17/19 0656   For home use only DME Hospital bed  Once   Question Answer Comment Length of Need 6 Months  The above medical condition requires: Patient requires the ability to reposition frequently  Head must be elevated greater than: 30 degrees  Bed type Semi-electric  Hoyer Lift Yes    07/17/19 0655  07/17/19 0655   For home use only DME Walker rolling  Once   Comments: 5" wheels Question:  Patient needs a walker to treat with the following condition  Answer:  Ambulatory dysfunction  07/17/19 0655  07/17/19 0654   For home use only DME standard manual wheelchair with seat cushion  Once   Comments: Patient suffers from ambulatory dysfunction which impairs their ability to perform daily activities like walking in the home.  A walking aid such as a walker will not resolve issue with performing activities of daily living. A wheelchair will allow patient to safely perform daily activities. Patient can safely propel the wheelchair in the home or has a caregiver who can provide assistance. Length of need 6 months. Accessories: elevating leg rests, wheel locks, extensions and anti-tippers.  07/17/19 4098

## 2019-07-18 NOTE — Progress Notes (Signed)
ANTICOAGULATION CONSULT NOTE - Follow Up Consult  Pharmacy Consult for Warfarin Indication: atrial fibrillation  No Known Allergies  Patient Measurements: Height: 6\' 3"  (190.5 cm) Weight: 219 lb 2.2 oz (99.4 kg) IBW/kg (Calculated) : 84.5  Vital Signs: Temp: 97.3 F (36.3 C) (12/28 0721) Temp Source: Oral (12/28 0721) BP: 154/54 (12/28 1145) Pulse Rate: 60 (12/28 1145)  Labs: Recent Labs    07/16/19 0534 07/17/19 0355 07/18/19 0400  HGB 7.8*  --  9.0*  HCT 23.3*  --  27.0*  PLT 296  --  363  LABPROT 34.3* 25.9* 29.5*  INR 3.4* 2.4* 2.8*  CREATININE 5.89*  --  5.95*   New to HD this admit  Assessment:  83 year old male presented 12/17 with blood tinged sputum and bilateral LLE edema. EKG suggestive of ischemia. Heparin was stopped 12/18, but patient went into atrial fibrillation and heparin was restarted. No anticoagulation prior to admission. Pharmacy asked to start warfarin on 12/20.  Heparin stopped on 12/24 when INR therapeutic.   INR 2.8 today. Remains therapeutic, but now needs to be held for placement on new HD catheter when INR <1.5.  Expect it will take several days to trend down.   Goal of Therapy:  INR 2-3 Monitor platelets by anticoagulation protocol: Yes   Plan:  Hold warfarin today Daily PT/INR  Arty Baumgartner, RPh Phone: [phone] 07/18/2019,12:38 PM

## 2019-07-19 LAB — CBC WITH DIFFERENTIAL/PLATELET
Abs Immature Granulocytes: 0.44 10*3/uL — ABNORMAL HIGH (ref 0.00–0.07)
Basophils Absolute: 0.1 10*3/uL (ref 0.0–0.1)
Basophils Relative: 0 %
Eosinophils Absolute: 0.3 10*3/uL (ref 0.0–0.5)
Eosinophils Relative: 1 %
HCT: 31.7 % — ABNORMAL LOW (ref 39.0–52.0)
Hemoglobin: 10.3 g/dL — ABNORMAL LOW (ref 13.0–17.0)
Immature Granulocytes: 2 %
Lymphocytes Relative: 17 %
Lymphs Abs: 3.4 10*3/uL (ref 0.7–4.0)
MCH: 30.7 pg (ref 26.0–34.0)
MCHC: 32.5 g/dL (ref 30.0–36.0)
MCV: 94.3 fL (ref 80.0–100.0)
Monocytes Absolute: 2.1 10*3/uL — ABNORMAL HIGH (ref 0.1–1.0)
Monocytes Relative: 11 %
Neutro Abs: 13.5 10*3/uL — ABNORMAL HIGH (ref 1.7–7.7)
Neutrophils Relative %: 69 %
Platelets: 421 10*3/uL — ABNORMAL HIGH (ref 150–400)
RBC: 3.36 MIL/uL — ABNORMAL LOW (ref 4.22–5.81)
RDW: 16.6 % — ABNORMAL HIGH (ref 11.5–15.5)
WBC: 19.9 10*3/uL — ABNORMAL HIGH (ref 4.0–10.5)
nRBC: 0.3 % — ABNORMAL HIGH (ref 0.0–0.2)

## 2019-07-19 LAB — BASIC METABOLIC PANEL
Anion gap: 14 (ref 5–15)
BUN: 52 mg/dL — ABNORMAL HIGH (ref 8–23)
CO2: 25 mmol/L (ref 22–32)
Calcium: 8.6 mg/dL — ABNORMAL LOW (ref 8.9–10.3)
Chloride: 94 mmol/L — ABNORMAL LOW (ref 98–111)
Creatinine, Ser: 4.48 mg/dL — ABNORMAL HIGH (ref 0.61–1.24)
GFR calc Af Amer: 13 mL/min — ABNORMAL LOW (ref >60)
GFR calc non Af Amer: 11 mL/min — ABNORMAL LOW (ref >60)
Glucose, Bld: 251 mg/dL — ABNORMAL HIGH (ref 70–99)
Potassium: 4 mmol/L (ref 3.5–5.1)
Sodium: 133 mmol/L — ABNORMAL LOW (ref 135–145)

## 2019-07-19 LAB — PROTIME-INR
INR: 3 — ABNORMAL HIGH (ref 0.8–1.2)
Prothrombin Time: 30.9 seconds — ABNORMAL HIGH (ref 11.4–15.2)

## 2019-07-19 LAB — GLUCOSE, CAPILLARY
Glucose-Capillary: 238 mg/dL — ABNORMAL HIGH (ref 70–99)
Glucose-Capillary: 222 mg/dL — ABNORMAL HIGH (ref 70–99)
Glucose-Capillary: 221 mg/dL — ABNORMAL HIGH (ref 70–99)
Glucose-Capillary: 253 mg/dL — ABNORMAL HIGH (ref 70–99)

## 2019-07-19 LAB — PTH, INTACT AND CALCIUM
Calcium, Total (PTH): 8.3 mg/dL — ABNORMAL LOW (ref 8.6–10.2)
PTH: 144 pg/mL — ABNORMAL HIGH (ref 15–65)

## 2019-07-19 MED ORDER — INSULIN GLARGINE 100 UNIT/ML ~~LOC~~ SOLN
6.0000 [IU] | Freq: Every day | SUBCUTANEOUS | Status: DC
  Administered 2019-07-20 – 2019-07-26 (×7): 6 [IU] via SUBCUTANEOUS
  Filled 2019-07-19 (×8): qty 0.06

## 2019-07-19 NOTE — Progress Notes (Signed)
Admit: 07/07/2019 LOS: 12  55M new ESRD, CAP  Subjective:  . IR waiting for INR and WBC to improve prior to [facility] . Pt states no new issues . WBC down to 19.9 today, INR 3.0, warfarin being held . HD yesterday 2.7L UF, post weigh 95.6kg  12/28 0701 - 12/29 0700 In: 240 [P.O.:240] Out: 3622 [Urine:300]  Filed Weights   07/18/19 0721 07/18/19 1155 07/19/19 0615  Weight: 99.4 kg 95.6 kg 98 kg    Scheduled Meds: . aspirin EC  81 mg Oral QHS  . atorvastatin  20 mg Oral Daily  . calcium acetate  1,334 mg Oral TID WC  . carvedilol  25 mg Oral BID WC  . Chlorhexidine Gluconate Cloth  6 each Topical Q0600  . cloNIDine  0.1 mg Oral BID  . darbepoetin (ARANESP) injection - DIALYSIS  150 mcg Intravenous Q Sat-HD  . finasteride  5 mg Oral QPM  . hydrALAZINE  100 mg Oral TID  . insulin aspart  0-9 Units Subcutaneous TID WC  . ipratropium-albuterol  3 mL Nebulization BID  . isosorbide dinitrate  30 mg Oral TID  . multivitamin  1 tablet Oral QHS  . ranolazine  1,000 mg Oral BID  . tamsulosin  0.4 mg Oral QPC supper  . Warfarin - Pharmacist Dosing Inpatient   Does not apply q1800   Continuous Infusions: . sodium chloride    . sodium chloride    . ampicillin-sulbactam (UNASYN) IV 3 g (07/19/19 0600)   PRN Meds:.sodium chloride, sodium chloride, acetaminophen **OR** acetaminophen, alteplase, heparin, lidocaine-prilocaine, ondansetron **OR** ondansetron (ZOFRAN) IV, pentafluoroprop-tetrafluoroeth  Current Labs: reviewed    Physical Exam:  Blood pressure (!) 111/44, pulse (!) 57, temperature (!) 97.5 F (36.4 C), temperature source Oral, resp. rate 18, height 6\' 3"  (1.905 m), weight 98 kg, SpO2 98 %. NAD elderly male RRR L RC AVF +BT, bandaged trace LEE S/nt/nd CTAB  A 1. New ESRD was CKD5; CLIP is [facility] MWF 2nd shift.  2. L RC AVF, infiltrated, with TEMP HD cath at current time not able to cannulate 12/28 pursuing TDC 3. CAP, on unasyn B Cx NG 4. HTN, stable; needs vol  down 5. Anemia: Getting Fe, ESA, CTM 6. CKD BMD: PhosLo, follow with HD.  PTH 144, stable  P . HD tomorrow: 4h, 3-4L UF BP permitting, try 26Z in AVF, no heparin . Await TDC, will look at using arm again tomorrow. . Medication Issues; o Preferred narcotic agents for pain control are hydromorphone, fentanyl, and methadone. Morphine should not be used.  o Baclofen should be avoided o Avoid oral sodium phosphate and magnesium citrate based laxatives / bowel preps    Pearson Grippe MD 07/19/2019, 12:30 PM  Recent Labs  Lab 07/16/19 0534 07/18/19 0400 07/18/19 0744 07/19/19 0303  NA 134* 129*  --  133*  K 3.8 4.2  --  4.0  CL 97* 93*  --  94*  CO2 23 22  --  25  GLUCOSE 200* 190*  --  251*  BUN 100* 87*  --  52*  CREATININE 5.89* 5.95*  --  4.48*  CALCIUM 8.4* 8.6* 8.3* 8.6*  PHOS 6.7*  --   --   --    Recent Labs  Lab 07/16/19 0534 07/18/19 0400 07/19/19 0303  WBC 23.1* 22.5* 19.9*  NEUTROABS  --  15.7* 13.5*  HGB 7.8* 9.0* 10.3*  HCT 23.3* 27.0* 31.7*  MCV 94.0 93.4 94.3  PLT 296 363 421*

## 2019-07-19 NOTE — Progress Notes (Signed)
ANTICOAGULATION CONSULT NOTE - Follow Up Consult  Pharmacy Consult for Coumadin Indication: atrial fibrillation  No Known Allergies  Patient Measurements: Height: 6\' 3"  (190.5 cm) Weight: 216 lb (98 kg) IBW/kg (Calculated) : 84.5  Vital Signs: Temp: 98.3 F (36.8 C) (12/29 0811) Temp Source: Oral (12/29 0811) BP: 150/48 (12/29 0811) Pulse Rate: 64 (12/29 0811)  Labs: Recent Labs    07/17/19 0355 07/18/19 0400 07/19/19 0303  HGB  --  9.0* 10.3*  HCT  --  27.0* 31.7*  PLT  --  363 421*  LABPROT 25.9* 29.5* 30.9*  INR 2.4* 2.8* 3.0*  CREATININE  --  5.95* 4.48*    Estimated Creatinine Clearance: 15.5 mL/min (A) (by C-G formula based on SCr of 4.48 mg/dL (H)).  Assessment:  Anticoag: Warf for new onset AFib - no AC PTA. Hgb 9>10.3 today. INR 3.CHA2DS2-VASc score 6  Goal of Therapy:  INR 2-3 Monitor platelets by anticoagulation protocol: Yes   Plan:  Hold Warfarin for TDC when INR<1.5 Daily PT/INR   Ray Merritt, PharmD, BCPS Clinical Staff Pharmacist [email or web address]  Alford Merritt, Ray Merritt 07/19/2019,9:09 AM

## 2019-07-19 NOTE — Progress Notes (Signed)
Inpatient Diabetes Program Recommendations  AACE/ADA: New Consensus Statement on Inpatient Glycemic Control (2015)  Target Ranges:  Prepandial:   less than 140 mg/dL      Peak postprandial:   less than 180 mg/dL (1-2 hours)      Critically ill patients:  140 - 180 mg/dL   Lab Results  Component Value Date   GLUCAP 222 (H) 07/19/2019   HGBA1C 5.4 07/07/2019    Review of Glycemic Control  Results for Ray Merritt, Ray Merritt (MRN [id number]) as of 07/19/2019 12:11  Ref. Range 07/18/2019 16:37 07/18/2019 22:09 07/19/2019 06:20 07/19/2019 11:58  Glucose-Capillary Latest Ref Range: 70 - 99 mg/dL 240 (H) 278 (H) 238 (H) 222 (H)    Diabetes history: DM2 Outpatient Diabetes medications: NPH 22 units at bedtime Current orders for Inpatient glycemic control: Novolog 0-9 TID  Inpatient Diabetes Program Recommendations:     Noted CBG's have been elevated since yesterday afternoon >200.    -Please consider Lantus 10 units daily  Thank you, Geoffry Paradise, RN, BSN Diabetes Coordinator Inpatient Diabetes Program [phone] (team pager from 8a-5p)

## 2019-07-19 NOTE — Progress Notes (Signed)
Physical Therapy Treatment Patient Details Name: Ray Merritt MRN: [id number] DOB: 17-Mar-1936 Today's Date: 07/19/2019    History of Present Illness 83yo male c/o SOB with blood tinged sputum, nausea/vomiting. Covid negative x2, flu panel negative. Admitted with sepsis secondary to CAP, acute on chronic CHF. PMH DM, CAD, CKD, CA, hx shoulder surgery, cardac cath    PT Comments    Patient seen for mobility progression. Pt is making gradual progress toward PT goals. Pt SOB with mobility. VSS on RA. Pt requires max A +2 to stand and mod A +2 for lateral scoot transfer to drop arm recliner using bed pad.  Pt and pt's family prefer that pt d/c home. Pt may benefit from [place] services if discharged home. Continue to progress as tolerated.    Follow Up Recommendations  SNF;Supervision/Assistance - 24 hour(pt and family prefer pt d/c home)     Equipment Recommendations  Rolling walker with 5" wheels;Wheelchair (measurements PT);Wheelchair cushion (measurements PT);Hospital bed;Other (comment)(hoyer lift)    Recommendations for Other Services       Precautions / Restrictions Precautions Precautions: Fall;Other (comment) Precaution Comments: watch vitals Restrictions Weight Bearing Restrictions: No    Mobility  Bed Mobility Overal bed mobility: Needs Assistance Bed Mobility: Supine to Sit     Supine to sit: +2 for physical assistance;HOB elevated;Mod assist     General bed mobility comments: pt able to bring bilat LE to EOB; assist to bring hips to EOB and to elevate trunk into sitting; increased assist due to painful L UE  Transfers Overall transfer level: Needs assistance Equipment used: Rolling walker (2 wheeled) Transfers: Sit to/from Stand Sit to Stand: Total assist;+2 physical assistance        Lateral/Scoot Transfers: Mod assist;+2 physical assistance General transfer comment: unable to clear buttocks on 3 sit to stand attempts  Ambulation/Gait             General Gait Details: unable   Stairs             Wheelchair Mobility    Modified Rankin (Stroke Patients Only)       Balance Overall balance assessment: Needs assistance Sitting-balance support: Bilateral upper extremity supported;Feet supported Sitting balance-Leahy Scale: Poor Sitting balance - Comments: modA-minG Postural control: Left lateral lean;Posterior lean   Standing balance-Leahy Scale: Zero                              Cognition Arousal/Alertness: Awake/alert Behavior During Therapy: WFL for tasks assessed/performed Overall Cognitive Status: Within Functional Limits for tasks assessed                                        Exercises      General Comments General comments (skin integrity, edema, etc.): EOB BP 139/74 (96), pt on RA with O2 sats >90%.      Pertinent Vitals/Pain Pain Assessment: Faces Faces Pain Scale: Hurts little more Pain Location: neck and L UE Pain Descriptors / Indicators: Guarding;Grimacing;Sore Pain Intervention(s): Limited activity within patient's tolerance;Monitored during session;Repositioned    Home Living                      Prior Function            PT Goals (current goals can now be found in the care plan section) Acute Rehab  PT Goals Patient Stated Goal: get stronger, go home Progress towards PT goals: Progressing toward goals    Frequency    Min 2X/week      PT Plan Current plan remains appropriate    Co-evaluation PT/OT/SLP Co-Evaluation/Treatment: Yes Reason for Co-Treatment: For patient/therapist safety;To address functional/ADL transfers PT goals addressed during session: Mobility/safety with mobility        AM-PAC PT "6 Clicks" Mobility   Outcome Measure  Help needed turning from your back to your side while in a flat bed without using bedrails?: A Lot Help needed moving from lying on your back to sitting on the side of a flat bed without  using bedrails?: A Lot Help needed moving to and from a bed to a chair (including a wheelchair)?: A Lot Help needed standing up from a chair using your arms (e.g., wheelchair or bedside chair)?: A Lot Help needed to walk in hospital room?: Total Help needed climbing 3-5 steps with a railing? : Total 6 Click Score: 10    End of Session Equipment Utilized During Treatment: Gait belt Activity Tolerance: Patient tolerated treatment well Patient left: with call bell/phone within reach;in chair;with chair alarm set Nurse Communication: Mobility status PT Visit Diagnosis: Unsteadiness on feet (R26.81);Muscle weakness (generalized) (M62.81);Difficulty in walking, not elsewhere classified (R26.2)     Time: 5449-2010 PT Time Calculation (min) (ACUTE ONLY): 31 min  Charges:  $Gait Training: 8-22 mins                     Earney Navy, PTA Acute Rehabilitation Services Pager: [phone] Office: [phone]     TEODORO JEFFREYS 07/19/2019, 9:43 AM

## 2019-07-19 NOTE — Progress Notes (Signed)
PROGRESS NOTE  Ray Merritt:[id number] DOB: 18-Dec-1935 DOA: 07/07/2019 PCP: [name], MD  HPI/Recap of past 5 hours: 83 year old gentleman with history of hypertension, CKD 4, coronary artery disease with recent abnormal stress test, CLL, insulin-dependent type 2 diabetes, who presented to the ED with complaints of gradually worsening shortness of breath and chest pain.  In the ED found to be febrile, elevated blood pressure, troponin S, and worsening creatinine.  EKG showing ST depression in inferior lateral leads.  Chest x-ray showing mild bibasilar interstitial prominence.  COVID-19 negative.  Cardiology and nephrology consulted started on hemodialysis, first HD on 07/09/2019.  Seen by cardiology and managed for NSTEMI.  Hospital course complicated by malfunction of AV fistula.  Generalized weakness, PT assessment recommended SNF.  Patient declines and prefers to go home with home health services PT OT.  Tunneled dialysis catheter planned prior to discharge.  07/19/19: Seen and examined.  No new complaints.  Eager to go home after [facility] placement.   Assessment/Plan: Principal Problem:   Sepsis ([place]) Active Problems:   CLL (chronic lymphocytic leukemia) (HCC)   Type 2 diabetes, HbA1c goal < 7% (HCC)   Acute renal failure superimposed on chronic kidney disease (HCC)   Acute on chronic combined systolic and diastolic CHF (congestive heart failure) (HCC)   Hypertensive urgency   Urinary tract infection   Abnormal EKG   Elevated troponin  New ESRD on HD - First HD on 07/09/2019 -Hemodialysis per renal service. -Patient is set up for [name] on Monday Wednesday Friday schedule second shift at discharge. -Dialysis access malfunction, will need [facility] prior to discharge per nephrology.  Resolving sepsis secondary to Enterococcous Faecalis community-acquired pneumonia, POA Concurrent E. coli UTI, likely POA -Urine culture grew greater than 100,000 colonies of E. coli,  pansensitive. -Sputum culture grew abundant enterococous faecalis, pansensitive. -Azithromycin/ceftriaxone discontinued, transition to Unasyn on 07/12/2019 x7/7 days. -Tentative stop date 07/19/19 for Unasyn Blood cultures taken on 07/11/2019 - final. Procalcitonin trending down, peaked at four-point 6/5 and 07/13/2019, 1.8 on 07/18/2019. WBC is trending down, afebrile.  Improving anemia of chronic disease complicated by iron deficiency -Received 3 unit irradiated PRBCs.  Nephrology managing with ESA and iron repletion Hemoglobin trending up to 10.3.   No sign of overt bleeding  Improving left knee effusion, recurrent Reports left knee effusion, stating it has happened multiple times in the past and last arthrocentesis was many years ago.  Declined further evaluation.  Effusion resolving.  Nontender on palpation.  No erythema.  Improving hypervolemic hyponatremia Sodium 133 from 129 Management per nephrology with hemodialysis Continue daily BMPs  Improving volume overload in the setting of new ESRD Management Per nephrology/dialysis, improving with hemodialysis  NSTEMI Followed by cardiology On aspirin and Lipitor Continue cardiac medications as recommended by cardiology.  Paroxysmal A. fib CHA2DS2-VASc score 6 Anticoagulate with Coumadin per cardiology Continue warfarin, managed by pharmacy INR 3.0 on 07/19/2019.  BPH Stable Still makes urine. Continue home medications: Tamsulosin and finasteride  Acute on chronic combined systolic and diastolic CHF Continue strict I's and O's and daily weight Continue cardiac medications Volume managed by hemodialysis and IV Lasix as needed for volume overload Net I&O -9.5 L  Essential hypertension Blood pressure is stable. Continue to monitor vital signs. Continue clonidine at lower dose 0.1 mg twice daily, Coreg 25 mg twice daily, p.o. hydralazine 100 mg 3 times daily, isosorbide dinitrate 30 mg 3 times daily.  CLL  Follow-up outpatient with oncology Dr. Earlie Server.  Physical debility,  ambulatory dysfunction PT assessment recommended SNF.  Patient desires to DC to home with family's assistance.  DME: [facility], hospital bed, wheelchair. Continue PT OT with assistance and fall precautions.  DVT prophylaxis: Coumadin Code Status: DNR  Family Communication:None  Disposition Plan:Plan discharge to home with home health services PT OT RN after [facility] placement and when patient is hemodynamically stable.  Consultants:  Cardiology, Dr. Einar Gip  Nephrology, Dr. Moshe Cipro  Interventional radiology  Procedures:  HD  Antimicrobials:  Rocephin, azithromycin, 07/07/2019--- 07/11/19  Unasyn 07/11/2019---ongoing -tentative stop date 07/19/2019    Objective: Vitals:   07/18/19 2000 07/18/19 2026 07/19/19 0052 07/19/19 0615  BP: (!) 132/41  (!) 141/51 (!) 153/44  Pulse: 65  63 70  Resp: (!) 21  20 15   Temp: 99.3 F (37.4 C)  99.4 F (37.4 C) 98.7 F (37.1 C)  TempSrc: Oral  Oral Oral  SpO2: 97% 97% 100% 99%  Weight:    98 kg  Height:        Intake/Output Summary (Last 24 hours) at 07/19/2019 0740 Last data filed at 07/18/2019 1643 Gross per 24 hour  Intake 240 ml  Output 3622 ml  Net -3382 ml   Filed Weights   07/18/19 0721 07/18/19 1155 07/19/19 0615  Weight: 99.4 kg 95.6 kg 98 kg    Exam:  . General: 83 y.o. year-old male Pleasant frail-appearing no acute distress.  Alert and oriented x3.  . Cardiovascular: Regular rate and rhythm no rubs or gallops.  No JVD or thyromegaly.  Marland Kitchen Respiratory: Clear to auscultation no wheezes or rales. . Abdomen: Soft nontender nondistended normal bowel sounds.   . Musculoskeletal: Left knee effusion is resolving.  Nontender on palpation.  No erythema.  Marland Kitchen Psychiatry: Mood is appropriate for condition and setting.   Data Reviewed: CBC: Recent Labs  Lab 07/14/19 0254 07/15/19 0446 07/16/19 0534 07/18/19 0400 07/19/19 0303  WBC  21.7* 20.8* 23.1* 22.5* 19.9*  NEUTROABS  --   --   --  15.7* 13.5*  HGB 6.6* 7.7* 7.8* 9.0* 10.3*  HCT 19.6* 22.6* 23.3* 27.0* 31.7*  MCV 94.7 91.5 94.0 93.4 94.3  PLT 294 265 296 363 353*   Basic Metabolic Panel: Recent Labs  Lab 07/13/19 0316 07/14/19 0254 07/16/19 0534 07/18/19 0400 07/18/19 0744 07/19/19 0303  NA 131* 128* 134* 129*  --  133*  K 4.3 4.5 3.8 4.2  --  4.0  CL 96* 92* 97* 93*  --  94*  CO2 18* 17* 23 22  --  25  GLUCOSE 189* 161* 200* 190*  --  251*  BUN 105* 118* 100* 87*  --  52*  CREATININE 6.46* 6.81* 5.89* 5.95*  --  4.48*  CALCIUM 8.7* 8.8* 8.4* 8.6* 8.3* 8.6*  PHOS  --   --  6.7*  --   --   --    GFR: Estimated Creatinine Clearance: 15.5 mL/min (A) (by C-G formula based on SCr of 4.48 mg/dL (H)). Liver Function Tests: Recent Labs  Lab 07/16/19 0534  ALBUMIN 2.0*   No results for input(s): LIPASE, AMYLASE in the last 168 hours. No results for input(s): AMMONIA in the last 168 hours. Coagulation Profile: Recent Labs  Lab 07/15/19 0446 07/16/19 0534 07/17/19 0355 07/18/19 0400 07/19/19 0303  INR 2.7* 3.4* 2.4* 2.8* 3.0*   Cardiac Enzymes: No results for input(s): CKTOTAL, CKMB, CKMBINDEX, TROPONINI in the last 168 hours. BNP (last 3 results) No results for input(s): PROBNP in the last 8760 hours. HbA1C: No results  for input(s): HGBA1C in the last 72 hours. CBG: Recent Labs  Lab 07/18/19 0623 07/18/19 1246 07/18/19 1637 07/18/19 2209 07/19/19 0620  GLUCAP 176* 168* 240* 278* 238*   Lipid Profile: No results for input(s): CHOL, HDL, LDLCALC, TRIG, CHOLHDL, LDLDIRECT in the last 72 hours. Thyroid Function Tests: No results for input(s): TSH, T4TOTAL, FREET4, T3FREE, THYROIDAB in the last 72 hours. Anemia Panel: No results for input(s): VITAMINB12, FOLATE, FERRITIN, TIBC, IRON, RETICCTPCT in the last 72 hours. Urine analysis:    Component Value Date/Time   COLORURINE YELLOW 07/07/2019 1409   APPEARANCEUR HAZY (A) 07/07/2019  1409   LABSPEC 1.014 07/07/2019 1409   PHURINE 5.0 07/07/2019 1409   GLUCOSEU NEGATIVE 07/07/2019 1409   HGBUR NEGATIVE 07/07/2019 1409   BILIRUBINUR NEGATIVE 07/07/2019 1409   KETONESUR NEGATIVE 07/07/2019 1409   PROTEINUR 100 (A) 07/07/2019 1409   UROBILINOGEN 0.2 10/07/2011 1203   NITRITE NEGATIVE 07/07/2019 1409   LEUKOCYTESUR SMALL (A) 07/07/2019 1409   Sepsis Labs: @LABRCNTIP (procalcitonin:4,lacticidven:4)  ) Recent Results (from the past 240 hour(s))  MRSA PCR Screening     Status: None   Collection Time: 07/11/19  6:35 AM   Specimen: Nasal Mucosa; Nasopharyngeal  Result Value Ref Range Status   MRSA by PCR NEGATIVE NEGATIVE Final    Comment:        The GeneXpert MRSA Assay (FDA approved for NASAL specimens only), is one component of a comprehensive MRSA colonization surveillance program. It is not intended to diagnose MRSA infection nor to guide or monitor treatment for MRSA infections. Performed at [facility], [place] [address] [place], [place] [address]   Culture, blood (routine x 2)     Status: None   Collection Time: 07/11/19  7:30 AM   Specimen: BLOOD RIGHT HAND  Result Value Ref Range Status   Specimen Description BLOOD RIGHT HAND  Final   Special Requests   Final    BOTTLES DRAWN AEROBIC ONLY Blood Culture results may not be optimal due to an inadequate volume of blood received in culture bottles   Culture   Final    NO GROWTH 5 DAYS Performed at [facility], [place] [address] [place], [place] [address]    Report Status 07/16/2019 FINAL  Final  Culture, blood (routine x 2)     Status: None   Collection Time: 07/11/19  7:35 AM   Specimen: BLOOD RIGHT HAND  Result Value Ref Range Status   Specimen Description BLOOD RIGHT HAND  Final   Special Requests   Final    BOTTLES DRAWN AEROBIC ONLY Blood Culture results may not be optimal due to an inadequate volume of blood received in culture bottles   Culture   Final    NO GROWTH 5 DAYS  Performed at [facility], [place] [address], [place],  [address]    Report Status 07/16/2019 FINAL  Final  Expectorated sputum assessment w rflx to resp cult     Status: None   Collection Time: 07/11/19  9:19 AM   Specimen: Sputum  Result Value Ref Range Status   Specimen Description SPUTUM  Final   Special Requests Immunocompromised  Final   Sputum evaluation   Final    THIS SPECIMEN IS ACCEPTABLE FOR SPUTUM CULTURE Performed at [place] [facility], [place] [address], [place],  [address]    Report Status 07/11/2019 FINAL  Final  Culture, respiratory     Status: None   Collection Time: 07/11/19  9:19  AM   Specimen: SPU  Result Value Ref Range Status   Specimen Description SPUTUM  Final   Special Requests Immunocompromised Reflexed from [id number]  Final   Gram Stain   Final    RARE WBC PRESENT, PREDOMINANTLY PMN FEW GRAM POSITIVE COCCI IN CLUSTERS RARE YEAST Performed at [facility], [address], [place],  [address]    Culture ABUNDANT ENTEROCOCCUS FAECALIS  Final   Report Status 07/13/2019 FINAL  Final   Organism ID, Bacteria ENTEROCOCCUS FAECALIS  Final      Susceptibility   Enterococcus faecalis - MIC*    AMPICILLIN <=2 SENSITIVE Sensitive     VANCOMYCIN 2 SENSITIVE Sensitive     GENTAMICIN SYNERGY SENSITIVE Sensitive     * ABUNDANT ENTEROCOCCUS FAECALIS      Studies: No results found.  Scheduled Meds: . aspirin EC  81 mg Oral QHS  . atorvastatin  20 mg Oral Daily  . calcium acetate  1,334 mg Oral TID WC  . carvedilol  25 mg Oral BID WC  . Chlorhexidine Gluconate Cloth  6 each Topical Q0600  . cloNIDine  0.1 mg Oral BID  . darbepoetin (ARANESP) injection - DIALYSIS  150 mcg Intravenous Q Sat-HD  . finasteride  5 mg Oral QPM  . hydrALAZINE  100 mg Oral TID  . insulin aspart  0-9 Units Subcutaneous TID WC  . ipratropium-albuterol  3 mL Nebulization BID  . isosorbide dinitrate  30 mg Oral TID  . multivitamin  1 tablet Oral QHS  .  ranolazine  1,000 mg Oral BID  . tamsulosin  0.4 mg Oral QPC supper  . Warfarin - Pharmacist Dosing Inpatient   Does not apply q1800    Continuous Infusions: . sodium chloride    . sodium chloride    . ampicillin-sulbactam (UNASYN) IV 3 g (07/19/19 0600)     LOS: 12 days     Kayleen Memos, MD [facility] Pager [phone]  If 7PM-7AM, please contact night-coverage [email or web address] Password [email or web address] 07/19/2019, 7:40 AM

## 2019-07-19 NOTE — Progress Notes (Signed)
Occupational Therapy Treatment Patient Details Name: Ray Merritt MRN: [id number] DOB: Jun 09, 1936 Today's Date: 07/19/2019    History of present illness 83yo male c/o SOB with blood tinged sputum, nausea/vomiting. Covid negative x2, flu panel negative. Admitted with sepsis secondary to CAP, acute on chronic CHF. PMH DM, CAD, CKD, CA, hx shoulder surgery, cardac cath   OT comments  Pt making steady progress towards OT goals this session. Session focus on functional transfers and sitting balance. Overall, pt requires MOD - MAX A +2 for all bed mobility and functional transfers. Attempted sit>stand from EOB with MAX A +2 but unable to fully clear buttocks to stand fully and pivot to chair. Pt was able to complete lateral scoot transfer from EOB >recliner with MOD A +2. Pt and family declining SNF or CIR, updated DC recs to [facility], potentially [facility] program with Alvis Lemmings if eligible. Will let OTR know about change in POC. Will continue to follow acutely per POC.    Follow Up Recommendations  Home health OT;Other (comment)([facility] if eligible)    Equipment Recommendations  3 in 1 bedside commode;Hospital bed    Recommendations for Other Services      Precautions / Restrictions Precautions Precautions: Fall;Other (comment) Precaution Comments: watch vitals Restrictions Weight Bearing Restrictions: No       Mobility Bed Mobility Overal bed mobility: Needs Assistance Bed Mobility: Supine to Sit     Supine to sit: +2 for physical assistance;HOB elevated;Mod assist     General bed mobility comments: pt able to bring bilat LE to EOB; assist to bring hips to EOB and to elevate trunk into sitting; increased assist due to painful L UE  Transfers Overall transfer level: Needs assistance Equipment used: Rolling walker (2 wheeled) Transfers: Sit to/from Stand Sit to Stand: Total assist;+2 physical assistance         General transfer comment: unable to clear buttocks on 3  sit to stand attempts    Balance Overall balance assessment: Needs assistance Sitting-balance support: Bilateral upper extremity supported;Feet supported Sitting balance-Leahy Scale: Poor Sitting balance - Comments: modA-minG Postural control: Left lateral lean;Posterior lean                                 ADL either performed or assessed with clinical judgement   ADL Overall ADL's : Needs assistance/impaired             Lower Body Bathing: Sitting/lateral leans;Supervison/ safety Lower Body Bathing Details (indicate cue type and reason): simulated from EOB     Lower Body Dressing: Supervision/safety;Sitting/lateral leans Lower Body Dressing Details (indicate cue type and reason): able to reach feet EOB to pull up socks Toilet Transfer: +2 for physical assistance;Moderate assistance Toilet Transfer Details (indicate cue type and reason): simulated via lateral scoot from EOB>recliner MOD A +2       Tub/Shower Transfer Details (indicate cue type and reason): reports walk in shower with seat and grab bars at home Functional mobility during ADLs: Moderate assistance;Maximal assistance;+2 for physical assistance General ADL Comments: session focus on sitting balance and funcitonal transfer training. Pt limited by decreased activity tolerance and generalized weakness     Vision Baseline Vision/History: Wears glasses Wears Glasses: Reading only     Perception     Praxis      Cognition Arousal/Alertness: Awake/alert Behavior During Therapy: WFL for tasks assessed/performed Overall Cognitive Status: Within Functional Limits for tasks assessed  Exercises Exercises: General Lower Extremity General Exercises - Lower Extremity Ankle Circles/Pumps: AROM;Both;10 reps Straight Leg Raises: AROM;Both;5 reps   Shoulder Instructions       General Comments EOB BP 139/74 (96), pt on RA with O2 sats >90%.     Pertinent Vitals/ Pain       Pain Assessment: Faces Faces Pain Scale: Hurts little more Pain Location: neck and L UE Pain Descriptors / Indicators: Guarding;Grimacing;Sore Pain Intervention(s): Limited activity within patient's tolerance;Monitored during session;Repositioned  Home Living                                          Prior Functioning/Environment              Frequency  Min 2X/week        Progress Toward Goals  OT Goals(current goals can now be found in the care plan section)  Progress towards OT goals: Progressing toward goals  Acute Rehab OT Goals Patient Stated Goal: get stronger, go home OT Goal Formulation: With patient Time For Goal Achievement: 07/25/19 Potential to Achieve Goals: Good  Plan Discharge plan needs to be updated    Co-evaluation      Reason for Co-Treatment: For patient/therapist safety;To address functional/ADL transfers PT goals addressed during session: Mobility/safety with mobility        AM-PAC OT "6 Clicks" Daily Activity     Outcome Measure   Help from another person eating meals?: None Help from another person taking care of personal grooming?: A Lot Help from another person toileting, which includes using toliet, bedpan, or urinal?: Total Help from another person bathing (including washing, rinsing, drying)?: Total Help from another person to put on and taking off regular upper body clothing?: A Lot Help from another person to put on and taking off regular lower body clothing?: Total 6 Click Score: 11    End of Session Equipment Utilized During Treatment: Gait belt;Rolling walker  OT Visit Diagnosis: Unsteadiness on feet (R26.81);Other abnormalities of gait and mobility (R26.89);Muscle weakness (generalized) (M62.81);Pain   Activity Tolerance Patient limited by fatigue;Patient tolerated treatment well   Patient Left in chair;with call bell/phone within reach;with chair alarm set   Nurse  Communication          Time: [phone] OT Time Calculation (min): 30 min  Charges: OT General Charges $OT Visit: 1 Visit OT Treatments $Self Care/Home Management : 8-22 mins  Lanier Clam., COTA/L Acute Rehabilitation Services [phone]   Ihor Gully 07/19/2019, 9:39 AM

## 2019-07-20 ENCOUNTER — Ambulatory Visit: Payer: Medicare Other | Admitting: Endocrinology

## 2019-07-20 LAB — CBC WITH DIFFERENTIAL/PLATELET
Abs Immature Granulocytes: 0.24 10*3/uL — ABNORMAL HIGH (ref 0.00–0.07)
Basophils Absolute: 0 10*3/uL (ref 0.0–0.1)
Basophils Relative: 0 %
Eosinophils Absolute: 0.3 10*3/uL (ref 0.0–0.5)
Eosinophils Relative: 1 %
HCT: 26.3 % — ABNORMAL LOW (ref 39.0–52.0)
Hemoglobin: 8.5 g/dL — ABNORMAL LOW (ref 13.0–17.0)
Immature Granulocytes: 1 %
Lymphocytes Relative: 18 %
Lymphs Abs: 3.1 10*3/uL (ref 0.7–4.0)
MCH: 30.6 pg (ref 26.0–34.0)
MCHC: 32.3 g/dL (ref 30.0–36.0)
MCV: 94.6 fL (ref 80.0–100.0)
Monocytes Absolute: 1.8 10*3/uL — ABNORMAL HIGH (ref 0.1–1.0)
Monocytes Relative: 10 %
Neutro Abs: 12.1 10*3/uL — ABNORMAL HIGH (ref 1.7–7.7)
Neutrophils Relative %: 70 %
Platelets: 427 10*3/uL — ABNORMAL HIGH (ref 150–400)
RBC: 2.78 MIL/uL — ABNORMAL LOW (ref 4.22–5.81)
RDW: 16 % — ABNORMAL HIGH (ref 11.5–15.5)
WBC: 17.6 10*3/uL — ABNORMAL HIGH (ref 4.0–10.5)
nRBC: 0.2 % (ref 0.0–0.2)

## 2019-07-20 LAB — GLUCOSE, CAPILLARY
Glucose-Capillary: 201 mg/dL — ABNORMAL HIGH (ref 70–99)
Glucose-Capillary: 129 mg/dL — ABNORMAL HIGH (ref 70–99)
Glucose-Capillary: 240 mg/dL — ABNORMAL HIGH (ref 70–99)
Glucose-Capillary: 234 mg/dL — ABNORMAL HIGH (ref 70–99)

## 2019-07-20 LAB — BASIC METABOLIC PANEL
Anion gap: 13 (ref 5–15)
BUN: 76 mg/dL — ABNORMAL HIGH (ref 8–23)
CO2: 24 mmol/L (ref 22–32)
Calcium: 8.6 mg/dL — ABNORMAL LOW (ref 8.9–10.3)
Chloride: 93 mmol/L — ABNORMAL LOW (ref 98–111)
Creatinine, Ser: 5.68 mg/dL — ABNORMAL HIGH (ref 0.61–1.24)
GFR calc Af Amer: 10 mL/min — ABNORMAL LOW (ref >60)
GFR calc non Af Amer: 9 mL/min — ABNORMAL LOW (ref >60)
Glucose, Bld: 218 mg/dL — ABNORMAL HIGH (ref 70–99)
Potassium: 3.8 mmol/L (ref 3.5–5.1)
Sodium: 130 mmol/L — ABNORMAL LOW (ref 135–145)

## 2019-07-20 LAB — PROTIME-INR
INR: 2.9 — ABNORMAL HIGH (ref 0.8–1.2)
Prothrombin Time: 30.3 seconds — ABNORMAL HIGH (ref 11.4–15.2)

## 2019-07-20 LAB — PROCALCITONIN: Procalcitonin: 1.39 ng/mL

## 2019-07-20 LAB — LACTIC ACID, PLASMA: Lactic Acid, Venous: 0.6 mmol/L (ref 0.5–1.9)

## 2019-07-20 MED ORDER — HEPARIN SODIUM (PORCINE) 1000 UNIT/ML IJ SOLN
INTRAMUSCULAR | Status: AC
  Filled 2019-07-20: qty 3

## 2019-07-20 NOTE — Progress Notes (Signed)
Updated patient's daughter Ms. Melonie via phone.  All questions answered.  Have all equipments ready at home.

## 2019-07-20 NOTE — Progress Notes (Signed)
ANTICOAGULATION CONSULT NOTE - Follow Up Consult  Pharmacy Consult for Coumadin Indication: atrial fibrillation  No Known Allergies  Patient Measurements: Height: 6\' 3"  (190.5 cm) Weight: 222 lb 10.6 oz (101 kg) IBW/kg (Calculated) : 84.5  Vital Signs: Temp: 98.1 F (36.7 C) (12/30 1216) Temp Source: Oral (12/30 1216) BP: 149/49 (12/30 1216) Pulse Rate: 66 (12/30 1216)  Labs: Recent Labs    07/18/19 0400 07/19/19 0303 07/20/19 0336  HGB 9.0* 10.3* 8.5*  HCT 27.0* 31.7* 26.3*  PLT 363 421* 427*  LABPROT 29.5* 30.9* 30.3*  INR 2.8* 3.0* 2.9*  CREATININE 5.95* 4.48* 5.68*    Estimated Creatinine Clearance: 12.2 mL/min (A) (by C-G formula based on SCr of 5.68 mg/dL (H)).  Assessment:  Anticoag: Warf for new onset AFib - no AC PTA. Hgb 9>10.3>8.5 today. INR 2.9 today .CHA2DS2-VASc score 6  Goal of Therapy:  INR 2-3 Monitor platelets by anticoagulation protocol: Yes   Plan:  Hold Warfarin for TDC when INR<1.5 Daily PT/INR   Loyda Costin S. Alford Highland, PharmD, BCPS Clinical Staff Pharmacist [email or web address]  Alford Highland, [employer] 07/20/2019,12:47 PM

## 2019-07-20 NOTE — Progress Notes (Signed)
Pt received from HD. VSS. Lunch tray set up. Call light in reach. Will continue to monitor.  Clyde Canterbury, RN

## 2019-07-20 NOTE — Procedures (Addendum)
I was present at this dialysis session. I have reviewed the session itself and made appropriate changes.   AVF still sig induration, +B/T.   Unable to cannulate, using Temp HD cath this AM.  WBC trending down but has CLL and usually with WBC btn 10 and 20.   2K bath, K3.8, change to 3K.  UF goal 3L. BP ok. Qb 350.   Next HD 1/1. TDC is still issue for discharge.   Filed Weights   07/19/19 0615 07/20/19 0335 07/20/19 0735  Weight: 98 kg 100.8 kg 101 kg    Recent Labs  Lab 07/16/19 0534 07/18/19 0400 07/20/19 0336  NA 134*  --  130*  K 3.8  --  3.8  CL 97*  --  93*  CO2 23  --  24  GLUCOSE 200*  --  218*  BUN 100*  --  76*  CREATININE 5.89*  --  5.68*  CALCIUM 8.4*   < > 8.6*  PHOS 6.7*  --   --    < > = values in this interval not displayed.    Recent Labs  Lab 07/18/19 0400 07/19/19 0303 07/20/19 0336  WBC 22.5* 19.9* 17.6*  NEUTROABS 15.7* 13.5* 12.1*  HGB 9.0* 10.3* 8.5*  HCT 27.0* 31.7* 26.3*  MCV 93.4 94.3 94.6  PLT 363 421* 427*    Scheduled Meds: . aspirin EC  81 mg Oral QHS  . atorvastatin  20 mg Oral Daily  . calcium acetate  1,334 mg Oral TID WC  . carvedilol  25 mg Oral BID WC  . Chlorhexidine Gluconate Cloth  6 each Topical Q0600  . cloNIDine  0.1 mg Oral BID  . darbepoetin (ARANESP) injection - DIALYSIS  150 mcg Intravenous Q Sat-HD  . finasteride  5 mg Oral QPM  . hydrALAZINE  100 mg Oral TID  . insulin aspart  0-9 Units Subcutaneous TID WC  . insulin glargine  6 Units Subcutaneous Daily  . ipratropium-albuterol  3 mL Nebulization BID  . isosorbide dinitrate  30 mg Oral TID  . multivitamin  1 tablet Oral QHS  . ranolazine  1,000 mg Oral BID  . tamsulosin  0.4 mg Oral QPC supper  . Warfarin - Pharmacist Dosing Inpatient   Does not apply q1800   Continuous Infusions: . sodium chloride    . sodium chloride     PRN Meds:.sodium chloride, sodium chloride, acetaminophen **OR** acetaminophen, alteplase, heparin, lidocaine-prilocaine,  ondansetron **OR** ondansetron (ZOFRAN) IV, pentafluoroprop-tetrafluoroeth   Ray Grippe  MD 07/20/2019, 9:13 AM

## 2019-07-20 NOTE — Progress Notes (Signed)
PROGRESS NOTE  Ray Merritt HEN:[id number] DOB: 03-Apr-1936 DOA: 07/07/2019 PCP: Ray Cha, MD  HPI/Recap of past 67 hours: 83 year old gentleman with history of hypertension, CKD 4, coronary artery disease with recent abnormal stress test, CLL, insulin-dependent type 2 diabetes, who presented to the ED with complaints of gradually worsening shortness of breath and chest pain.  In the ED found to be febrile, elevated blood pressure, troponin S, and worsening creatinine.  EKG showing ST depression in inferior lateral leads.  Chest x-ray showing mild bibasilar interstitial prominence.  COVID-19 negative.  Cardiology and nephrology consulted started on hemodialysis, first HD on 07/09/2019.  Seen by cardiology and managed for NSTEMI.  Hospital course complicated by malfunction of AV fistula.  Generalized weakness, PT assessment recommended SNF.  Patient declines and prefers to go home with home health services PT OT.  Tunneled dialysis catheter planned prior to discharge.  07/20/19: Seen and examined.  He has no new complaints.  WBC remains elevated.  Discussed with his oncologist Dr. Earlie Merritt, his baseline WBC is normal, therefore elevated WBC not related to his CLL.  Currently on IV antibiotics for Enterococcus faecalis community-acquired pneumonia.  Blood cultures negative to date.  Curbsided with infectious disease, Dr. Wendie Merritt.  Will repeat blood cultures peripherally x2.   Assessment/Plan: Principal Problem:   Sepsis ([place]) Active Problems:   CLL (chronic lymphocytic leukemia) (HCC)   Type 2 diabetes, HbA1c goal < 7% (HCC)   Acute renal failure superimposed on chronic kidney disease (HCC)   Acute on chronic combined systolic and diastolic CHF (congestive heart failure) (HCC)   Hypertensive urgency   Urinary tract infection   Abnormal EKG   Elevated troponin  New ESRD on HD - First HD on 07/09/2019 -Hemodialysis per renal service. -Patient is set up for Ray Merritt on  Monday Wednesday Friday schedule second shift at discharge. -Dialysis access malfunction, will need Ray Merritt prior to discharge per nephrology. -Hemodialysis planned today  Resolving sepsis secondary to Enterococcous Faecalis community-acquired pneumonia, POA Concurrent E. coli UTI, likely POA -Urine culture grew greater than 100,000 colonies of E. coli, pansensitive. -Sputum culture grew abundant enterococous faecalis, pansensitive. -Azithromycin/ceftriaxone discontinued, transition to Unasyn on 07/12/2019, completed 8 days. Blood cultures taken on 07/11/2019 - final. Procalcitonin trending down, peaked at four-point 6/5 and 07/13/2019, 1.8 on 07/18/2019. WBC is trending down, afebrile. Repeat blood cultures on 07/20/2019 Repeat procalcitonin on 07/20/2019. Curb sided with infectious disease, management as stated above.  Anemia of chronic disease in the setting of ESRD complicated by iron deficiency -Received 3 unit irradiated PRBCs.  Nephrology managing with ESA and iron repletion No sign of overt bleeding Continue ESA and iron replacement  Improving left knee effusion, recurrent Reports left knee effusion, stating it has happened multiple times in the past and last arthrocentesis was many years ago.  Declined further evaluation.  Effusion resolving.  Nontender on palpation.  No erythema.  Hypervolemic hyponatremia Management per nephrology with hemodialysis Continue daily BMPs Hemodialysis planned today.  Improving volume overload in the setting of new ESRD Management Per nephrology/dialysis, improving with hemodialysis  NSTEMI Followed by cardiology On aspirin and Lipitor Continue cardiac medications as recommended by cardiology.  Paroxysmal A. fib CHA2DS2-VASc score 6 Anticoagulate with Coumadin per cardiology Continue warfarin, managed by pharmacy INR 2.9.  BPH Stable Still makes urine. Continue home medications: Tamsulosin and finasteride  Acute on chronic  combined systolic and diastolic CHF Continue strict I's and O's and daily weight Continue cardiac medications Volume managed by hemodialysis  and IV Lasix as needed for volume overload Net I&O -9.5 L  Essential hypertension Blood pressure is stable. Continue to monitor vital signs. Continue clonidine at lower dose 0.1 mg twice daily, Coreg 25 mg twice daily, p.o. hydralazine 100 mg 3 times daily, isosorbide dinitrate 30 mg 3 times daily.  CLL Follow-up outpatient with oncology Dr. Earlie Merritt. Curbsided with Dr. Earlie Merritt.  Patient has a normal WBC with his CLL.  Last WBC 8000 in August 2020.  Physical debility, ambulatory dysfunction PT assessment recommended SNF.  Patient desires to DC to home with family's assistance.  DME: Ray Merritt, hospital bed, wheelchair. Continue PT OT with assistance and fall precautions.  DVT prophylaxis: Coumadin Code Status: DNR  Family Communication:None  Disposition Plan:Plan discharge to home with home health services PT OT RN after [facility] placement and when patient is hemodynamically stable.  Consultants:  Cardiology, Ray Merritt  Nephrology, Ray Merritt  Interventional radiology  Procedures:  HD  Antimicrobials:  Rocephin, azithromycin, 07/07/2019--- 07/11/19  Unasyn 07/11/2019--- 07/20/19    Objective: Vitals:   07/20/19 0735 07/20/19 0743 07/20/19 0745 07/20/19 0800  BP: (!) 134/50 (!) 140/59 (!) 141/60 (!) 136/57  Pulse: 62 66 68 61  Resp: 15     Temp: 98 F (36.7 C)     TempSrc: Oral     SpO2: 98%     Weight: 101 kg     Height:       No intake or output data in the 24 hours ending 07/20/19 0856 Filed Weights   07/19/19 0615 07/20/19 0335 07/20/19 0735  Weight: 98 kg 100.8 kg 101 kg    Exam:  . General: 83 y.o. year-old male Pleasant frail-appearing in no acute distress.  Alert and oriented x3. . Cardiovascular: Regular rate and rhythm no rubs or gallops.  No JVD or thyromegaly noted. Marland Kitchen Respiratory: Clear  to auscultation no wheezes or rales.   . Abdomen: Nontender normal bowel sounds present.   . Musculoskeletal: Trace lower extremity edema bilaterally. Psychiatry: Mood is appropriate for condition and setting.  Data Reviewed: CBC: Recent Labs  Lab 07/15/19 0446 07/16/19 0534 07/18/19 0400 07/19/19 0303 07/20/19 0336  WBC 20.8* 23.1* 22.5* 19.9* 17.6*  NEUTROABS  --   --  15.7* 13.5* 12.1*  HGB 7.7* 7.8* 9.0* 10.3* 8.5*  HCT 22.6* 23.3* 27.0* 31.7* 26.3*  MCV 91.5 94.0 93.4 94.3 94.6  PLT 265 296 363 421* 299*   Basic Metabolic Panel: Recent Labs  Lab 07/14/19 0254 07/16/19 0534 07/18/19 0400 07/18/19 0744 07/19/19 0303 07/20/19 0336  NA 128* 134* 129*  --  133* 130*  K 4.5 3.8 4.2  --  4.0 3.8  CL 92* 97* 93*  --  94* 93*  CO2 17* 23 22  --  25 24  GLUCOSE 161* 200* 190*  --  251* 218*  BUN 118* 100* 87*  --  52* 76*  CREATININE 6.81* 5.89* 5.95*  --  4.48* 5.68*  CALCIUM 8.8* 8.4* 8.6* 8.3* 8.6* 8.6*  PHOS  --  6.7*  --   --   --   --    GFR: Estimated Creatinine Clearance: 12.2 mL/min (A) (by C-G formula based on SCr of 5.68 mg/dL (H)). Liver Function Tests: Recent Labs  Lab 07/16/19 0534  ALBUMIN 2.0*   No results for input(s): LIPASE, AMYLASE in the last 168 hours. No results for input(s): AMMONIA in the last 168 hours. Coagulation Profile: Recent Labs  Lab 07/16/19 0534 07/17/19 0355 07/18/19 0400 07/19/19 0303 07/20/19  0336  INR 3.4* 2.4* 2.8* 3.0* 2.9*   Cardiac Enzymes: No results for input(s): CKTOTAL, CKMB, CKMBINDEX, TROPONINI in the last 168 hours. BNP (last 3 results) No results for input(s): PROBNP in the last 8760 hours. HbA1C: No results for input(s): HGBA1C in the last 72 hours. CBG: Recent Labs  Lab 07/19/19 0620 07/19/19 1158 07/19/19 1653 07/19/19 2113 07/20/19 0617  GLUCAP 238* 222* 221* 253* 201*   Lipid Profile: No results for input(s): CHOL, HDL, LDLCALC, TRIG, CHOLHDL, LDLDIRECT in the last 72 hours. Thyroid  Function Tests: No results for input(s): TSH, T4TOTAL, FREET4, T3FREE, THYROIDAB in the last 72 hours. Anemia Panel: No results for input(s): VITAMINB12, FOLATE, FERRITIN, TIBC, IRON, RETICCTPCT in the last 72 hours. Urine analysis:    Component Value Date/Time   COLORURINE YELLOW 07/07/2019 1409   APPEARANCEUR HAZY (A) 07/07/2019 1409   LABSPEC 1.014 07/07/2019 1409   PHURINE 5.0 07/07/2019 1409   GLUCOSEU NEGATIVE 07/07/2019 1409   HGBUR NEGATIVE 07/07/2019 1409   BILIRUBINUR NEGATIVE 07/07/2019 1409   KETONESUR NEGATIVE 07/07/2019 1409   PROTEINUR 100 (A) 07/07/2019 1409   UROBILINOGEN 0.2 10/07/2011 1203   NITRITE NEGATIVE 07/07/2019 1409   LEUKOCYTESUR SMALL (A) 07/07/2019 1409   Sepsis Labs: @LABRCNTIP (procalcitonin:4,lacticidven:4)  ) Recent Results (from the past 240 hour(s))  MRSA PCR Screening     Status: None   Collection Time: 07/11/19  6:35 AM   Specimen: Nasal Mucosa; Nasopharyngeal  Result Value Ref Range Status   MRSA by PCR NEGATIVE NEGATIVE Final    Comment:        The GeneXpert MRSA Assay (FDA approved for NASAL specimens only), is one component of a comprehensive MRSA colonization surveillance program. It is not intended to diagnose MRSA infection nor to guide or monitor treatment for MRSA infections. Performed at [facility], [place] [address] [place], [place] [address]   Culture, blood (routine x 2)     Status: None   Collection Time: 07/11/19  7:30 AM   Specimen: BLOOD RIGHT HAND  Result Value Ref Range Status   Specimen Description BLOOD RIGHT HAND  Final   Special Requests   Final    BOTTLES DRAWN AEROBIC ONLY Blood Culture results may not be optimal due to an inadequate volume of blood received in culture bottles   Culture   Final    NO GROWTH 5 DAYS Performed at [facility], [place] [address], [place], [place] [address]    Report Status 07/16/2019 FINAL  Final  Culture, blood (routine x 2)     Status: None   Collection Time:  07/11/19  7:35 AM   Specimen: BLOOD RIGHT HAND  Result Value Ref Range Status   Specimen Description BLOOD RIGHT HAND  Final   Special Requests   Final    BOTTLES DRAWN AEROBIC ONLY Blood Culture results may not be optimal due to an inadequate volume of blood received in culture bottles   Culture   Final    NO GROWTH 5 DAYS Performed at [facility], [place] [address] [place], [place] [address]    Report Status 07/16/2019 FINAL  Final  Expectorated sputum assessment w rflx to resp cult     Status: None   Collection Time: 07/11/19  9:19 AM   Specimen: Sputum  Result Value Ref Range Status   Specimen Description SPUTUM  Final   Special Requests Immunocompromised  Final   Sputum evaluation   Final    THIS SPECIMEN IS  ACCEPTABLE FOR SPUTUM CULTURE Performed at [facility], [place] [address] [place], [place] [address]    Report Status 07/11/2019 FINAL  Final  Culture, respiratory     Status: None   Collection Time: 07/11/19  9:19 AM   Specimen: SPU  Result Value Ref Range Status   Specimen Description SPUTUM  Final   Special Requests Immunocompromised Reflexed from [id number]  Final   Gram Stain   Final    RARE WBC PRESENT, PREDOMINANTLY PMN FEW GRAM POSITIVE COCCI IN CLUSTERS RARE YEAST Performed at [facility], [place] [address] [place], [place] [address]    Culture ABUNDANT ENTEROCOCCUS FAECALIS  Final   Report Status 07/13/2019 FINAL  Final   Organism ID, Bacteria ENTEROCOCCUS FAECALIS  Final      Susceptibility   Enterococcus faecalis - MIC*    AMPICILLIN <=2 SENSITIVE Sensitive     VANCOMYCIN 2 SENSITIVE Sensitive     GENTAMICIN SYNERGY SENSITIVE Sensitive     * ABUNDANT ENTEROCOCCUS FAECALIS      Studies: No results found.  Scheduled Meds: . aspirin EC  81 mg Oral QHS  . atorvastatin  20 mg Oral Daily  . calcium acetate  1,334 mg Oral TID WC  . carvedilol  25 mg Oral BID WC  . Chlorhexidine Gluconate Cloth  6 each Topical Q0600  . cloNIDine  0.1 mg  Oral BID  . darbepoetin (ARANESP) injection - DIALYSIS  150 mcg Intravenous Q Sat-HD  . finasteride  5 mg Oral QPM  . hydrALAZINE  100 mg Oral TID  . insulin aspart  0-9 Units Subcutaneous TID WC  . insulin glargine  6 Units Subcutaneous Daily  . ipratropium-albuterol  3 mL Nebulization BID  . isosorbide dinitrate  30 mg Oral TID  . multivitamin  1 tablet Oral QHS  . ranolazine  1,000 mg Oral BID  . tamsulosin  0.4 mg Oral QPC supper  . Warfarin - Pharmacist Dosing Inpatient   Does not apply q1800    Continuous Infusions: . sodium chloride    . sodium chloride    . ampicillin-sulbactam (UNASYN) IV 3 g (07/20/19 0600)     LOS: 13 days     Kayleen Memos, MD [facility] Pager [phone]  If 7PM-7AM, please contact night-coverage [email or web address] Password [email or web address] 07/20/2019, 8:56 AM

## 2019-07-21 ENCOUNTER — Other Ambulatory Visit: Payer: Self-pay | Admitting: Cardiology

## 2019-07-21 LAB — GLUCOSE, CAPILLARY
Glucose-Capillary: 200 mg/dL — ABNORMAL HIGH (ref 70–99)
Glucose-Capillary: 227 mg/dL — ABNORMAL HIGH (ref 70–99)
Glucose-Capillary: 188 mg/dL — ABNORMAL HIGH (ref 70–99)
Glucose-Capillary: 179 mg/dL — ABNORMAL HIGH (ref 70–99)

## 2019-07-21 LAB — BASIC METABOLIC PANEL
Anion gap: 13 (ref 5–15)
BUN: 48 mg/dL — ABNORMAL HIGH (ref 8–23)
CO2: 25 mmol/L (ref 22–32)
Calcium: 8.3 mg/dL — ABNORMAL LOW (ref 8.9–10.3)
Chloride: 94 mmol/L — ABNORMAL LOW (ref 98–111)
Creatinine, Ser: 4.88 mg/dL — ABNORMAL HIGH (ref 0.61–1.24)
GFR calc Af Amer: 12 mL/min — ABNORMAL LOW (ref >60)
GFR calc non Af Amer: 10 mL/min — ABNORMAL LOW (ref >60)
Glucose, Bld: 219 mg/dL — ABNORMAL HIGH (ref 70–99)
Potassium: 3.7 mmol/L (ref 3.5–5.1)
Sodium: 132 mmol/L — ABNORMAL LOW (ref 135–145)

## 2019-07-21 LAB — CBC WITH DIFFERENTIAL/PLATELET
Abs Immature Granulocytes: 0.15 10*3/uL — ABNORMAL HIGH (ref 0.00–0.07)
Basophils Absolute: 0 10*3/uL (ref 0.0–0.1)
Basophils Relative: 0 %
Eosinophils Absolute: 0.2 10*3/uL (ref 0.0–0.5)
Eosinophils Relative: 1 %
HCT: 25 % — ABNORMAL LOW (ref 39.0–52.0)
Hemoglobin: 8.4 g/dL — ABNORMAL LOW (ref 13.0–17.0)
Immature Granulocytes: 1 %
Lymphocytes Relative: 18 %
Lymphs Abs: 2.9 10*3/uL (ref 0.7–4.0)
MCH: 31.7 pg (ref 26.0–34.0)
MCHC: 33.6 g/dL (ref 30.0–36.0)
MCV: 94.3 fL (ref 80.0–100.0)
Monocytes Absolute: 1.8 10*3/uL — ABNORMAL HIGH (ref 0.1–1.0)
Monocytes Relative: 11 %
Neutro Abs: 11.4 10*3/uL — ABNORMAL HIGH (ref 1.7–7.7)
Neutrophils Relative %: 69 %
Platelets: 422 10*3/uL — ABNORMAL HIGH (ref 150–400)
RBC: 2.65 MIL/uL — ABNORMAL LOW (ref 4.22–5.81)
RDW: 15.8 % — ABNORMAL HIGH (ref 11.5–15.5)
WBC: 16.5 10*3/uL — ABNORMAL HIGH (ref 4.0–10.5)
nRBC: 0.1 % (ref 0.0–0.2)

## 2019-07-21 LAB — PROTIME-INR
INR: 2.3 — ABNORMAL HIGH (ref 0.8–1.2)
Prothrombin Time: 25.4 seconds — ABNORMAL HIGH (ref 11.4–15.2)

## 2019-07-21 MED ORDER — IPRATROPIUM-ALBUTEROL 0.5-2.5 (3) MG/3ML IN SOLN: 3.0000 mL | RESPIRATORY_TRACT | Status: DC | PRN

## 2019-07-21 NOTE — Progress Notes (Signed)
Physical Therapy Treatment Patient Details Name: Ray Merritt MRN: [id number] DOB: 02-Jun-1936 Today's Date: 07/21/2019    History of Present Illness 83yo male c/o SOB with blood tinged sputum, nausea/vomiting. Covid negative x2, flu panel negative. Admitted with sepsis secondary to CAP, acute on chronic CHF. PMH DM, CAD, CKD, CA, hx shoulder surgery, cardac cath    PT Comments    Patient seen for mobility progression. This session focused on bed mobility, functional transfer training, and seated therex. Pt tolerated session well with VSS. Pt gets SOB quickly with activity and demonstrates good breathing technique for recovery. Continue to progress as tolerated.     Follow Up Recommendations  SNF;Supervision/Assistance - 24 hour(pt and family prefer pt d/c home)     Equipment Recommendations  Rolling walker with 5" wheels;Wheelchair (measurements PT);Wheelchair cushion (measurements PT);Hospital bed;Other (comment)(hoyer lift)    Recommendations for Other Services       Precautions / Restrictions Precautions Precautions: Fall Restrictions Weight Bearing Restrictions: No    Mobility  Bed Mobility Overal bed mobility: Needs Assistance Bed Mobility: Supine to Sit     Supine to sit: +2 for physical assistance;HOB elevated;Mod assist     General bed mobility comments: pt able to bring bilat LE to EOB; assist to bring hips to EOB and to elevate trunk into sitting  Transfers Overall transfer level: Needs assistance   Transfers: Lateral/Scoot Transfers          Lateral/Scoot Transfers: Mod assist;+2 physical assistance General transfer comment: cues for sequencing and hand placement; assist with bed pad to scoot hips bed to drop arm recliner   Ambulation/Gait             General Gait Details: unable   Stairs             Wheelchair Mobility    Modified Rankin (Stroke Patients Only)       Balance Overall balance assessment: Needs  assistance Sitting-balance support: Bilateral upper extremity supported;Feet supported Sitting balance-Leahy Scale: Poor Sitting balance - Comments: pt initially requiring assist for sitting balance due to posterior bias but progressed to min guard for safety sitting EOB Postural control: Posterior lean                                  Cognition Arousal/Alertness: Awake/alert Behavior During Therapy: WFL for tasks assessed/performed Overall Cognitive Status: Within Functional Limits for tasks assessed                                        Exercises General Exercises - Upper Extremity Chair Push Up: AROM;Limitations Chair Push Up Limitations: becomes SOB easily and requires time to recover after 2 reps    General Comments        Pertinent Vitals/Pain Pain Assessment: Faces Faces Pain Scale: Hurts little more Pain Location: L UE Pain Descriptors / Indicators: Guarding;Sore Pain Intervention(s): Limited activity within patient's tolerance;Monitored during session;Repositioned    Home Living                      Prior Function            PT Goals (current goals can now be found in the care plan section) Progress towards PT goals: Progressing toward goals    Frequency    Min 2X/week  PT Plan Current plan remains appropriate    Co-evaluation              AM-PAC PT "6 Clicks" Mobility   Outcome Measure  Help needed turning from your back to your side while in a flat bed without using bedrails?: A Lot Help needed moving from lying on your back to sitting on the side of a flat bed without using bedrails?: A Lot Help needed moving to and from a bed to a chair (including a wheelchair)?: A Lot Help needed standing up from a chair using your arms (e.g., wheelchair or bedside chair)?: A Lot Help needed to walk in hospital room?: Total Help needed climbing 3-5 steps with a railing? : Total 6 Click Score: 10    End of  Session Equipment Utilized During Treatment: Gait belt Activity Tolerance: Patient tolerated treatment well Patient left: with call bell/phone within reach;in chair;with chair alarm set Nurse Communication: Mobility status PT Visit Diagnosis: Unsteadiness on feet (R26.81);Muscle weakness (generalized) (M62.81);Difficulty in walking, not elsewhere classified (R26.2)     Time: 6967-8938 PT Time Calculation (min) (ACUTE ONLY): 23 min  Charges:  $Therapeutic Activity: 8-22 mins                     Earney Navy, PTA Acute Rehabilitation Services Pager: [phone] Office: [phone]     Darliss Cheney 07/21/2019, 9:50 AM

## 2019-07-21 NOTE — Progress Notes (Signed)
Occupational Therapy Treatment Patient Details Name: Ray Merritt MRN: [id number] DOB: [birth year] Today's Date: 07/21/2019    History of present illness 83yo male c/o SOB with blood tinged sputum, nausea/vomiting. Covid negative x2, flu panel negative. Admitted with sepsis secondary to CAP, acute on chronic CHF. PMH DM, CAD, CKD, CA, hx shoulder surgery, cardac cath   OT comments  Pt. Seen with PT for skilled treatment session. Focus of session safety and technique for functional transfers in preparation for transfers with family assist at home.  Pt. Able to complete lateral scoot transfer with mod a x2 person assist and use of bed pads to guide hips from one surface to the next.  Pt. With good hand placement and technique for repositioning hips in recliner.  Able to verbalize need for rest breaks and has good PLB technique.  Reviewed general UE exercises per pt. Request to complete throughout the day.  Pt. Reports good family support upon d/c citing multiple family members that will be with him.  Also describes having a hoyer lift at home to help with tranfers.    Follow Up Recommendations  Home health OT;Other (comment)    Equipment Recommendations  3 in 1 bedside commode;Hospital bed    Recommendations for Other Services      Precautions / Restrictions Precautions Precautions: Fall Precaution Comments: watch vitals Restrictions Weight Bearing Restrictions: No       Mobility Bed Mobility Overal bed mobility: Needs Assistance Bed Mobility: Supine to Sit     Supine to sit: +2 for physical assistance;HOB elevated;Mod assist     General bed mobility comments: pt able to bring bilat LE to EOB; assist to bring hips to EOB and to elevate trunk into sitting  Transfers Overall transfer level: Needs assistance   Transfers: Lateral/Scoot Transfers          Lateral/Scoot Transfers: Mod assist;+2 physical assistance General transfer comment: cues for sequencing and hand  placement; assist with bed pad to scoot hips bed to drop arm recliner     Balance Overall balance assessment: Needs assistance Sitting-balance support: Bilateral upper extremity supported;Feet supported Sitting balance-Leahy Scale: Poor Sitting balance - Comments: pt initially requiring assist for sitting balance due to posterior bias but progressed to min guard for safety sitting EOB Postural control: Posterior lean                                 ADL either performed or assessed with clinical judgement   ADL Overall ADL's : Needs assistance/impaired                         Toilet Transfer: +2 for physical assistance;Moderate assistance Toilet Transfer Details (indicate cue type and reason): simulated via lateral scoot from EOB>recliner MOD A +2           General ADL Comments: session focus on sitting balance and funcitonal transfer training. Pt limited by decreased activity tolerance and generalized weakness but initiates rest breaks appropriately.  good demo and understanding of PLB.  describes a hoyer lift for home states his dtr. has one at the house for him     Vision       Perception     Praxis      Cognition Arousal/Alertness: Awake/alert Behavior During Therapy: [facility] for tasks assessed/performed Overall Cognitive Status: Within Functional Limits for tasks assessed  Exercises General Exercises - Upper Extremity Chair Push Up: AROM;Limitations Chair Push Up Limitations: becomes SOB easily and requires time to recover after 2 reps General BUE exercises "punching" type motions pt. Able to keep steady movement of arms approx. 10 seconds with appropriately initiated rest break.     Shoulder Instructions       General Comments      Pertinent Vitals/ Pain       Pain Assessment: No/denies pain Faces Pain Scale: Hurts little more Pain Location: L UE Pain Descriptors / Indicators:  Guarding;Sore Pain Intervention(s): Limited activity within patient's tolerance;Monitored during session;Repositioned  Home Living                                          Prior Functioning/Environment              Frequency  Min 2X/week        Progress Toward Goals  OT Goals(current goals can now be found in the care plan section)  Progress towards OT goals: Progressing toward goals     Plan Discharge plan remains appropriate    Co-evaluation    PT/OT/SLP Co-Evaluation/Treatment: Yes Reason for Co-Treatment: For patient/therapist safety;To address functional/ADL transfers   OT goals addressed during session: ADL's and self-care;Strengthening/ROM      AM-PAC OT "6 Clicks" Daily Activity     Outcome Measure   Help from another person eating meals?: None Help from another person taking care of personal grooming?: A Lot Help from another person toileting, which includes using toliet, bedpan, or urinal?: Total Help from another person bathing (including washing, rinsing, drying)?: Total Help from another person to put on and taking off regular upper body clothing?: A Lot Help from another person to put on and taking off regular lower body clothing?: Total 6 Click Score: 11    End of Session    OT Visit Diagnosis: Unsteadiness on feet (R26.81);Other abnormalities of gait and mobility (R26.89);Muscle weakness (generalized) (M62.81);Pain   Activity Tolerance Patient limited by fatigue;Patient tolerated treatment well   Patient Left in chair;with call bell/phone within reach   Nurse Communication          Time: 6468-0321 OT Time Calculation (min): 23 min  Charges: OT General Charges $OT Visit: 1 Visit OT Treatments $Self Care/Home Management : 8-22 mins  Rico Junker M, COTA/L 07/21/2019, 10:45 AM

## 2019-07-21 NOTE — Progress Notes (Signed)
  [facility] Progress Note    Assessment/ Plan:   1. New ESRD was CKD5; CLIP is [facility] MWF 2nd shift.  Next HD 07/21/2018  2. L RC AVF, infiltrated, with TEMP HD cath at current time not able to cannulate 12/28 or 12/30 pursuing TDC 3. CAP, on unasyn B Cx NG 4. HTN, stable; needs vol down 5. Anemia: Getting Fe, ESA, CTM 6. CKD BMD: PhosLo, follow with HD.  PTH 144, stable  Subjective:    Feels well.  No issues today.  For HD tomorrow.  Still awaiting TDC placement.     Objective:   BP (!) 149/47 (BP Location: Right Arm)   Pulse 66   Temp 98.8 F (37.1 C) (Oral)   Resp 18   Ht 6\' 3"  (1.905 m)   Wt 98.4 kg   SpO2 93%   BMI 27.11 kg/m   Intake/Output Summary (Last 24 hours) at 07/21/2019 1504 Last data filed at 07/21/2019 8182 Gross per 24 hour  Intake 1390 ml  Output 550 ml  Net 840 ml   Weight change: 0.2 kg  Physical Exam: Gen: NAD CVS: RRR Resp: clear anteriorly Abd: nontender Ext: no LE edema ACCESS: R IJ nontunneled HD cath, LUE AVF dressed  Imaging: No results found.  Labs: BMET Recent Labs  Lab 07/16/19 0534 07/18/19 0400 07/18/19 0744 07/19/19 0303 07/20/19 0336 07/21/19 0354  NA 134* 129*  --  133* 130* 132*  K 3.8 4.2  --  4.0 3.8 3.7  CL 97* 93*  --  94* 93* 94*  CO2 23 22  --  25 24 25   GLUCOSE 200* 190*  --  251* 218* 219*  BUN 100* 87*  --  52* 76* 48*  CREATININE 5.89* 5.95*  --  4.48* 5.68* 4.88*  CALCIUM 8.4* 8.6* 8.3* 8.6* 8.6* 8.3*  PHOS 6.7*  --   --   --   --   --    CBC Recent Labs  Lab 07/18/19 0400 07/19/19 0303 07/20/19 0336 07/21/19 0354  WBC 22.5* 19.9* 17.6* 16.5*  NEUTROABS 15.7* 13.5* 12.1* 11.4*  HGB 9.0* 10.3* 8.5* 8.4*  HCT 27.0* 31.7* 26.3* 25.0*  MCV 93.4 94.3 94.6 94.3  PLT 363 421* 427* 422*    Medications:    . aspirin EC  81 mg Oral QHS  . atorvastatin  20 mg Oral Daily  . calcium acetate  1,334 mg Oral TID WC  . carvedilol  25 mg Oral BID WC  . Chlorhexidine Gluconate Cloth  6  each Topical Q0600  . cloNIDine  0.1 mg Oral BID  . darbepoetin (ARANESP) injection - DIALYSIS  150 mcg Intravenous Q Sat-HD  . finasteride  5 mg Oral QPM  . hydrALAZINE  100 mg Oral TID  . insulin aspart  0-9 Units Subcutaneous TID WC  . insulin glargine  6 Units Subcutaneous Daily  . isosorbide dinitrate  30 mg Oral TID  . multivitamin  1 tablet Oral QHS  . ranolazine  1,000 mg Oral BID  . tamsulosin  0.4 mg Oral QPC supper      Madelon Lips, MD 07/21/2019, 3:04 PM

## 2019-07-21 NOTE — Progress Notes (Signed)
PROGRESS NOTE  Ray Merritt:[id number] DOB: Dec 02, 1935 DOA: 07/07/2019 PCP: [name], MD  HPI/Recap of past 95 hours: 83 year old gentleman with history of hypertension, CKD 4, coronary artery disease with recent abnormal stress test, CLL, insulin-dependent type 2 diabetes, who presented to the ED with complaints of gradually worsening shortness of breath and chest pain.  In the ED found to be febrile, elevated blood pressure, troponin S, and worsening creatinine.  EKG showing ST depression in inferior lateral leads.  Chest x-ray showing mild bibasilar interstitial prominence.  COVID-19 negative.  Cardiology and nephrology consulted started on hemodialysis, first HD on 07/09/2019.  Seen by cardiology and managed for NSTEMI.  Hospital course complicated by malfunction of AV fistula.  Generalized weakness, PT assessment recommended SNF.  Patient declines and prefers to go home with home health services PT OT.  Tunneled dialysis catheter planned prior to discharge.  07/20/19: Seen and examined.  He has no new complaints.  WBC remains elevated.  Discussed with his oncologist Dr. Earlie Server, his baseline WBC is normal, therefore elevated WBC not related to his CLL.  Currently on IV antibiotics for Enterococcus faecalis community-acquired pneumonia.  Blood cultures negative to date.  Curbsided with infectious disease, Dr. Wendie Agreste.  Will repeat blood cultures peripherally x2.  07/21/19: Seen and examined.  No new complaints.  Off antibiotic, WBC and procalcitonin trending down.  Next HD 1/121.  Repeated blood cultures negative to date.   Assessment/Plan: Principal Problem:   Sepsis ([place]) Active Problems:   CLL (chronic lymphocytic leukemia) (HCC)   Type 2 diabetes, HbA1c goal < 7% (HCC)   Acute renal failure superimposed on chronic kidney disease (HCC)   Acute on chronic combined systolic and diastolic CHF (congestive heart failure) (HCC)   Hypertensive urgency   Urinary tract  infection   Abnormal EKG   Elevated troponin  New ESRD on HD - First HD on 07/09/2019 -Hemodialysis per renal service. -Patient is set up for [name] on Monday Wednesday Friday schedule second shift at discharge. -Dialysis access malfunction, will need [facility] prior to discharge per nephrology. -Hemodialysis planned 07/22/19  Resolving sepsis secondary to Enterococcous Faecalis community-acquired pneumonia, POA Concurrent E. coli UTI, likely POA -Urine culture grew greater than 100,000 colonies of E. coli, pansensitive. -Sputum culture grew abundant enterococous faecalis, pansensitive. -Azithromycin/ceftriaxone discontinued, transition to Unasyn on 07/12/2019, completed 8 days. Blood cultures taken on 07/11/2019 - final. Procalcitonin trending down, peaked at four-point 6/5 and 07/13/2019, 1.8 on 07/18/2019. Repeat blood cultures on 07/20/2019 negative to date Repeat procalcitonin on 07/20/2019 trending down. Curb sided with infectious disease, management as stated above. Afebrile, vital signs stable. WBC trending down   Anemia of chronic disease in the setting of ESRD complicated by iron deficiency -Received 3 unit irradiated PRBCs.  Nephrology managing with ESA and iron repletion No sign of overt bleeding Continue ESA and iron replacement  Improving left knee effusion, recurrent Reports left knee effusion, stating it has happened multiple times in the past and last arthrocentesis was many years ago.  Declined further evaluation.  Effusion resolving.  Nontender on palpation.  No erythema.  Hypervolemic hyponatremia Management per nephrology with hemodialysis Continue daily BMPs Hemodialysis planned today.  Improving volume overload in the setting of new ESRD Management Per nephrology/dialysis, improving with hemodialysis  NSTEMI Followed by cardiology On aspirin and Lipitor Continue cardiac medications as recommended by cardiology.  Paroxysmal A. fib CHA2DS2-VASc  score 6 Anticoagulate with Coumadin per cardiology Continue warfarin, managed by pharmacy INR is  therapeutic 2.3.  BPH Stable Still makes urine. Continue home medications: Tamsulosin and finasteride  Acute on chronic combined systolic and diastolic CHF Continue strict I's and O's and daily weight Continue cardiac medications Volume managed by hemodialysis and IV Lasix as needed for volume overload Net I&O -9.5 L  Essential hypertension Blood pressure is stable. Continue to monitor vital signs. Continue clonidine at lower dose 0.1 mg twice daily, Coreg 25 mg twice daily, p.o. hydralazine 100 mg 3 times daily, isosorbide dinitrate 30 mg 3 times daily.  CLL Follow-up outpatient with oncology Dr. Earlie Server. Curbsided with Dr. Earlie Server.  Patient has a normal WBC with his CLL.  Last WBC 8000 in August 2020.  Physical debility, ambulatory dysfunction PT assessment recommended SNF.  Patient desires to DC to home with family's assistance.  DME: [facility] Rile, hospital bed, wheelchair. Continue PT OT with assistance and fall precautions.  DVT prophylaxis: Coumadin Code Status: DNR  Family Communication:None  Disposition Plan:Plan discharge to home with home health services PT OT RN after [facility] placement and when patient is hemodynamically stable.  Consultants:  Cardiology, Dr. Einar Gip  Nephrology, Dr. Moshe Cipro  Interventional radiology  Procedures:  HD  Antimicrobials:  Rocephin, azithromycin, 07/07/2019--- 07/11/19  Unasyn 07/11/2019--- 07/20/19    Objective: Vitals:   07/21/19 0520 07/21/19 0745 07/21/19 0750 07/21/19 1220  BP:  (!) 137/48  (!) 149/47  Pulse:  65  66  Resp:  18  18  Temp: 98.1 F (36.7 C) 98.3 F (36.8 C)  98.8 F (37.1 C)  TempSrc: Oral Oral  Oral  SpO2:  99% 99% 93%  Weight: 98.4 kg     Height:        Intake/Output Summary (Last 24 hours) at 07/21/2019 1450 Last data filed at 07/21/2019 0824 Gross per 24 hour  Intake 1390  ml  Output 550 ml  Net 840 ml   Filed Weights   07/20/19 0735 07/20/19 1143 07/21/19 0520  Weight: 101 kg 98 kg 98.4 kg    Exam:  . General: 83 y.o. year-old male pleasant well-developed well-nourished no acute distress.  Alert oriented x3.   . Cardiovascular: Regular rate and rhythm no rubs or gallops. Marland Kitchen Respiratory: Clear to auscultation no wheezes no mass.   . Abdomen: Nontender normal bowel sounds present.   Musculoskeletal: Trace lower extremity edema.   Psychiatry: Mood is appropriate for condition and setting.   Data Reviewed: CBC: Recent Labs  Lab 07/16/19 0534 07/18/19 0400 07/19/19 0303 07/20/19 0336 07/21/19 0354  WBC 23.1* 22.5* 19.9* 17.6* 16.5*  NEUTROABS  --  15.7* 13.5* 12.1* 11.4*  HGB 7.8* 9.0* 10.3* 8.5* 8.4*  HCT 23.3* 27.0* 31.7* 26.3* 25.0*  MCV 94.0 93.4 94.3 94.6 94.3  PLT 296 363 421* 427* 563*   Basic Metabolic Panel: Recent Labs  Lab 07/16/19 0534 07/18/19 0400 07/18/19 0744 07/19/19 0303 07/20/19 0336 07/21/19 0354  NA 134* 129*  --  133* 130* 132*  K 3.8 4.2  --  4.0 3.8 3.7  CL 97* 93*  --  94* 93* 94*  CO2 23 22  --  25 24 25   GLUCOSE 200* 190*  --  251* 218* 219*  BUN 100* 87*  --  52* 76* 48*  CREATININE 5.89* 5.95*  --  4.48* 5.68* 4.88*  CALCIUM 8.4* 8.6* 8.3* 8.6* 8.6* 8.3*  PHOS 6.7*  --   --   --   --   --    GFR: Estimated Creatinine Clearance: 14.2 mL/min (A) (by C-G formula  based on SCr of 4.88 mg/dL (H)). Liver Function Tests: Recent Labs  Lab 07/16/19 0534  ALBUMIN 2.0*   No results for input(s): LIPASE, AMYLASE in the last 168 hours. No results for input(s): AMMONIA in the last 168 hours. Coagulation Profile: Recent Labs  Lab 07/17/19 0355 07/18/19 0400 07/19/19 0303 07/20/19 0336 07/21/19 0354  INR 2.4* 2.8* 3.0* 2.9* 2.3*   Cardiac Enzymes: No results for input(s): CKTOTAL, CKMB, CKMBINDEX, TROPONINI in the last 168 hours. BNP (last 3 results) No results for input(s): PROBNP in the last 8760  hours. HbA1C: No results for input(s): HGBA1C in the last 72 hours. CBG: Recent Labs  Lab 07/20/19 1216 07/20/19 1624 07/20/19 2031 07/21/19 0631 07/21/19 1216  GLUCAP 129* 240* 234* 200* 227*   Lipid Profile: No results for input(s): CHOL, HDL, LDLCALC, TRIG, CHOLHDL, LDLDIRECT in the last 72 hours. Thyroid Function Tests: No results for input(s): TSH, T4TOTAL, FREET4, T3FREE, THYROIDAB in the last 72 hours. Anemia Panel: No results for input(s): VITAMINB12, FOLATE, FERRITIN, TIBC, IRON, RETICCTPCT in the last 72 hours. Urine analysis:    Component Value Date/Time   COLORURINE YELLOW 07/07/2019 1409   APPEARANCEUR HAZY (A) 07/07/2019 1409   LABSPEC 1.014 07/07/2019 1409   PHURINE 5.0 07/07/2019 1409   GLUCOSEU NEGATIVE 07/07/2019 1409   HGBUR NEGATIVE 07/07/2019 1409   BILIRUBINUR NEGATIVE 07/07/2019 1409   KETONESUR NEGATIVE 07/07/2019 1409   PROTEINUR 100 (A) 07/07/2019 1409   UROBILINOGEN 0.2 10/07/2011 1203   NITRITE NEGATIVE 07/07/2019 1409   LEUKOCYTESUR SMALL (A) 07/07/2019 1409   Sepsis Labs: @LABRCNTIP (procalcitonin:4,lacticidven:4)  ) Recent Results (from the past 240 hour(s))  Culture, blood (routine x 2)     Status: None (Preliminary result)   Collection Time: 07/20/19  1:16 PM   Specimen: BLOOD RIGHT HAND  Result Value Ref Range Status   Specimen Description BLOOD RIGHT HAND  Final   Special Requests   Final    BOTTLES DRAWN AEROBIC ONLY Blood Culture adequate volume   Culture   Final    NO GROWTH 1 DAY Performed at [facility], [place] [address], [place], [place] [address]    Report Status PENDING  Incomplete  Culture, blood (routine x 2)     Status: None (Preliminary result)   Collection Time: 07/20/19  1:16 PM   Specimen: BLOOD RIGHT HAND  Result Value Ref Range Status   Specimen Description BLOOD RIGHT HAND  Final   Special Requests   Final    BOTTLES DRAWN AEROBIC ONLY Blood Culture adequate volume   Culture   Final    NO GROWTH 1  DAY Performed at [facility], [place] [address] [place], [place] [address]    Report Status PENDING  Incomplete      Studies: No results found.  Scheduled Meds: . aspirin EC  81 mg Oral QHS  . atorvastatin  20 mg Oral Daily  . calcium acetate  1,334 mg Oral TID WC  . carvedilol  25 mg Oral BID WC  . Chlorhexidine Gluconate Cloth  6 each Topical Q0600  . cloNIDine  0.1 mg Oral BID  . darbepoetin (ARANESP) injection - DIALYSIS  150 mcg Intravenous Q Sat-HD  . finasteride  5 mg Oral QPM  . hydrALAZINE  100 mg Oral TID  . insulin aspart  0-9 Units Subcutaneous TID WC  . insulin glargine  6 Units Subcutaneous Daily  . isosorbide dinitrate  30 mg Oral TID  . multivitamin  1 tablet Oral  QHS  . ranolazine  1,000 mg Oral BID  . tamsulosin  0.4 mg Oral QPC supper    Continuous Infusions:    LOS: 14 days     Kayleen Memos, MD [facility] Pager [phone]  If 7PM-7AM, please contact night-coverage [email or web address] Password [email or web address] 07/21/2019, 2:50 PM

## 2019-07-22 DIAGNOSIS — L8990 Pressure ulcer of unspecified site, unspecified stage: Secondary | ICD-10-CM | POA: Insufficient documentation

## 2019-07-22 LAB — GLUCOSE, CAPILLARY
Glucose-Capillary: 188 mg/dL — ABNORMAL HIGH (ref 70–99)
Glucose-Capillary: 192 mg/dL — ABNORMAL HIGH (ref 70–99)
Glucose-Capillary: 211 mg/dL — ABNORMAL HIGH (ref 70–99)
Glucose-Capillary: 206 mg/dL — ABNORMAL HIGH (ref 70–99)

## 2019-07-22 LAB — PROTIME-INR
INR: 2.3 — ABNORMAL HIGH (ref 0.8–1.2)
Prothrombin Time: 24.9 seconds — ABNORMAL HIGH (ref 11.4–15.2)

## 2019-07-22 LAB — CBC WITH DIFFERENTIAL/PLATELET
Abs Immature Granulocytes: 0.11 10*3/uL — ABNORMAL HIGH (ref 0.00–0.07)
Basophils Absolute: 0 10*3/uL (ref 0.0–0.1)
Basophils Relative: 0 %
Eosinophils Absolute: 0.2 10*3/uL (ref 0.0–0.5)
Eosinophils Relative: 1 %
HCT: 24.3 % — ABNORMAL LOW (ref 39.0–52.0)
Hemoglobin: 8.2 g/dL — ABNORMAL LOW (ref 13.0–17.0)
Immature Granulocytes: 1 %
Lymphocytes Relative: 17 %
Lymphs Abs: 2.8 10*3/uL (ref 0.7–4.0)
MCH: 31.5 pg (ref 26.0–34.0)
MCHC: 33.7 g/dL (ref 30.0–36.0)
MCV: 93.5 fL (ref 80.0–100.0)
Monocytes Absolute: 1.6 10*3/uL — ABNORMAL HIGH (ref 0.1–1.0)
Monocytes Relative: 10 %
Neutro Abs: 11.4 10*3/uL — ABNORMAL HIGH (ref 1.7–7.7)
Neutrophils Relative %: 71 %
Platelets: 449 10*3/uL — ABNORMAL HIGH (ref 150–400)
RBC: 2.6 MIL/uL — ABNORMAL LOW (ref 4.22–5.81)
RDW: 15.3 % (ref 11.5–15.5)
WBC: 16.1 10*3/uL — ABNORMAL HIGH (ref 4.0–10.5)
nRBC: 0 % (ref 0.0–0.2)

## 2019-07-22 LAB — BASIC METABOLIC PANEL
Anion gap: 12 (ref 5–15)
BUN: 73 mg/dL — ABNORMAL HIGH (ref 8–23)
CO2: 25 mmol/L (ref 22–32)
Calcium: 8.7 mg/dL — ABNORMAL LOW (ref 8.9–10.3)
Chloride: 93 mmol/L — ABNORMAL LOW (ref 98–111)
Creatinine, Ser: 5.84 mg/dL — ABNORMAL HIGH (ref 0.61–1.24)
GFR calc Af Amer: 10 mL/min — ABNORMAL LOW (ref >60)
GFR calc non Af Amer: 8 mL/min — ABNORMAL LOW (ref >60)
Glucose, Bld: 173 mg/dL — ABNORMAL HIGH (ref 70–99)
Potassium: 4 mmol/L (ref 3.5–5.1)
Sodium: 130 mmol/L — ABNORMAL LOW (ref 135–145)

## 2019-07-22 LAB — PROCALCITONIN: Procalcitonin: 1.06 ng/mL

## 2019-07-22 NOTE — Progress Notes (Addendum)
Alert, awake, pleasant, oriented x 4. Vital signs stable , remained afebrile, no acute distress noted tonight. Denied pain.   Plan to go for dialysis this am. Awaiting for  tunneled HD  cath placement.   Pt had dark tea color  250 ml urine out put through condom cath tonight, total 450 ml in 24 hours.  Dressing changed on left arm. Right IJ dressing clean and intact, dressing changed yesterday. Pressure ulcer stage II under buttock area, cleaned and new foam changed. Encouraged changing position q 2 hrs. Pt required one assistant.   Continue to monitor.  Kennyth Lose, RN

## 2019-07-22 NOTE — Progress Notes (Signed)
Informed Anson Crofts, Rn HD Tx moved to 07/22/2018 per Dr. Hollie Salk

## 2019-07-22 NOTE — Progress Notes (Signed)
PROGRESS NOTE  Ray Merritt BPZ:[id number] DOB: Oct 08, 1935 DOA: 07/07/2019 PCP: Ray Cha, MD  HPI/Recap of past 46 hours: 84 year old gentleman with history of hypertension, CKD4 progressing to CKD 5, coronary artery disease with recent abnormal stress test, CLL with normal WBC count per oncology Dr. Earlie Merritt, insulin-dependent type 2 diabetes, who presented to the ED with complaints of gradually worsening shortness of breath and chest pain.  In the ED found to be febrile, elevated blood pressure, troponin S, and worsening creatinine.  EKG showing ST depression in inferior lateral leads.  Chest x-ray showing mild bibasilar interstitial prominence.  COVID-19 negative.  Cardiology and nephrology consulted started on hemodialysis, first HD on 07/09/2019.  Seen by cardiology and managed for NSTEMI.  Hospital course complicated by malfunction of AV fistula and slow to improve leukocytosis not on steroids.  Discussed with oncology, not related to his CLL since neutrophil is elevated.  Discussed with ID Dr. Tommy Merritt who recommended to stop IV abx on 12/30.  Generalized weakness, PT assessment recommended SNF.  Patient declines and prefers to go home.  Tunneled dialysis catheter planned prior to discharge. IR would like improvement of leukocytosis prior to placement.  Repeated blood cultures peripherally which are negative to date.  Procalcitonin trending down.  Afebrile and no respiratory complaints.  Lactic acid negative.  07/22/19: Seen and examined.  No new complaints.  Hemodialysis planned today.   Assessment/Plan: Principal Problem:   Sepsis (Ray Merritt) Active Problems:   CLL (chronic lymphocytic leukemia) (HCC)   Type 2 diabetes, HbA1c goal < 7% (HCC)   Acute renal failure superimposed on chronic kidney disease (HCC)   Acute on chronic combined systolic and diastolic CHF (congestive heart failure) (HCC)   Hypertensive urgency   Urinary tract infection   Abnormal EKG   Elevated  troponin   Pressure injury of skin  New ESRD with worsening CKD 5 on HD MWF - First HD on 07/09/2019 -Hemodialysis per renal service. -Patient is set up for Ray Merritt on Monday Wednesday Friday schedule second shift at discharge. -Dialysis access malfunction, will need [facility] prior to discharge per nephrology. Awaiting IR decision to place [facility] [place]; IR awaiting improvement of leukocytosis -Hemodialysis planned 07/22/19  Leukocytosis, improving Treated for E. Faecalis CAP WBC trending down Procalcitonin also trending down Repeat procalcitonjn and CBC w diff in the AM When values normalize please contact IR for possible TDC placement  Resolving sepsis secondary to Enterococcous Faecalis community-acquired pneumonia, POA Concurrent E. coli UTI, likely POA -Urine culture grew greater than 100,000 colonies of E. coli, pansensitive. -Sputum culture grew abundant enterococous faecalis, pansensitive. -Azithromycin/ceftriaxone discontinued, transition to Unasyn on 07/12/2019 after  completing 8 days. Blood cultures taken on 07/11/2019 negative final. Procalcitonin trending down, repeat procalcitonin and CBC w diff in AM Repeat blood cultures on 07/20/2019 negative to date, continue to follow cultures Antibiotics DC'd on 07/20/2019 as recommended by infectious disease, Dr. Tommy Merritt (curb-sided) Afebrile and not septic appearing  Enterococcus faecalis community-acquired pneumonia Completed course of antibiotics Afebrile O2 saturation 99% on room air  Anemia of chronic disease in the setting of ESRD complicated by iron deficiency -Received 3 unit irradiated PRBCs.  Nephrology managing with ESA and iron repletion No sign of overt bleeding Continue ESA and iron replacement  Improving left knee effusion, recurrent Reports left knee effusion, stating it has happened multiple times in the past and last arthrocentesis was many years ago.  Declined further evaluation.  Effusion resolving.  Nontender on  palpation.  No erythema.  Hypervolemic hyponatremia Volume managed by nephrology with hemodialysis HD planned today.  NSTEMI Followed by cardiology On aspirin and Lipitor Continue cardiac medications as recommended by cardiology.  Paroxysmal A. fib CHA2DS2-VASc score 6 Anticoagulate with Coumadin per cardiology Continue warfarin, managed by pharmacy INR is therapeutic 2.3.  BPH Stable Still makes urine. Continue home medications: Tamsulosin and finasteride  Acute on chronic combined systolic and diastolic CHF Continue strict I's and O's and daily weight Continue cardiac medications Volume managed by hemodialysis and IV Lasix as needed for volume overload Net I&O -11.0 L  Essential hypertension Blood pressure is stable. Continue to monitor vital signs. Continue clonidine at lower dose 0.1 mg twice daily, Coreg 25 mg twice daily, p.o. hydralazine 100 mg 3 times daily, isosorbide dinitrate 30 mg 3 times daily.  CLL Follow-up outpatient with oncology Dr. Earlie Merritt. Curbsided with Dr. Earlie Merritt.  Patient has a normal WBC with his CLL.  Last WBC 8K in August 2020 per oncology.  Physical debility, ambulatory dysfunction PT assessment recommended SNF.  Patient desires to DC to home with family's assistance.  DME: [facility], hospital bed, wheelchair. Continue PT OT with assistance and fall precautions.  DVT prophylaxis: Coumadin Code Status: DNR  Family Communication:Updated daughter via phone.  Disposition Plan:Plan discharge to home with home health services PT OT RN after [facility] placement and when patient is hemodynamically stable.  Consultants:  Cardiology, Dr. Einar Merritt  Nephrology, Dr. Moshe Merritt  Interventional radiology  Procedures:  HD  Antimicrobials:  Rocephin, azithromycin, 07/07/2019--- 07/11/19  Unasyn 07/11/2019--- 07/20/19    Objective: Vitals:   07/22/19 0507 07/22/19 0609 07/22/19 0832 07/22/19 1113  BP:   (!) 122/52 (!) 148/49    Pulse: (!) 58 (!) 59  (!) 58  Resp: (!) 21 17  15   Temp:   98.5 F (36.9 C) 98.6 F (37 C)  TempSrc:   Axillary Oral  SpO2: 100% 95%  99%  Weight: 98.7 kg     Height:        Intake/Output Summary (Last 24 hours) at 07/22/2019 1200 Last data filed at 07/22/2019 1114 Gross per 24 hour  Intake 830 ml  Output 350 ml  Net 480 ml   Filed Weights   07/20/19 1143 07/21/19 0520 07/22/19 0507  Weight: 98 kg 98.4 kg 98.7 kg    Exam:  . General: 84 y.o. year-old male pleasant well-developed well-nourished no acute distress.  Alert and oriented x3.   . Cardiovascular: Regular rate and rhythm no rubs or gallops.  No JVD or thyromegaly noted.  Marland Kitchen Respiratory: Clear to auscultation no wheezes or rales.  Good inspiratory effort. Abdomen: Nontender normal bowel sounds present.   Musculoskeletal: Trace lower extremity edema bilaterally.   Psychiatry: Mood is appropriate for condition and setting.   Data Reviewed: CBC: Recent Labs  Lab 07/18/19 0400 07/19/19 0303 07/20/19 0336 07/21/19 0354 07/22/19 0324  WBC 22.5* 19.9* 17.6* 16.5* 16.1*  NEUTROABS 15.7* 13.5* 12.1* 11.4* 11.4*  HGB 9.0* 10.3* 8.5* 8.4* 8.2*  HCT 27.0* 31.7* 26.3* 25.0* 24.3*  MCV 93.4 94.3 94.6 94.3 93.5  PLT 363 421* 427* 422* 168*   Basic Metabolic Panel: Recent Labs  Lab 07/16/19 0534 07/18/19 0400 07/18/19 0744 07/19/19 0303 07/20/19 0336 07/21/19 0354 07/22/19 0324  NA 134* 129*  --  133* 130* 132* 130*  K 3.8 4.2  --  4.0 3.8 3.7 4.0  CL 97* 93*  --  94* 93* 94* 93*  CO2 23 22  --  25 24 25  25  GLUCOSE 200* 190*  --  251* 218* 219* 173*  BUN 100* 87*  --  52* 76* 48* 73*  CREATININE 5.89* 5.95*  --  4.48* 5.68* 4.88* 5.84*  CALCIUM 8.4* 8.6* 8.3* 8.6* 8.6* 8.3* 8.7*  PHOS 6.7*  --   --   --   --   --   --    GFR: Estimated Creatinine Clearance: 11.9 mL/min (A) (by C-G formula based on SCr of 5.84 mg/dL (H)). Liver Function Tests: Recent Labs  Lab 07/16/19 0534  ALBUMIN 2.0*   No results  for input(s): LIPASE, AMYLASE in the last 168 hours. No results for input(s): AMMONIA in the last 168 hours. Coagulation Profile: Recent Labs  Lab 07/18/19 0400 07/19/19 0303 07/20/19 0336 07/21/19 0354 07/22/19 0324  INR 2.8* 3.0* 2.9* 2.3* 2.3*   Cardiac Enzymes: No results for input(s): CKTOTAL, CKMB, CKMBINDEX, TROPONINI in the last 168 hours. BNP (last 3 results) No results for input(s): PROBNP in the last 8760 hours. HbA1C: No results for input(s): HGBA1C in the last 72 hours. CBG: Recent Labs  Lab 07/21/19 1216 07/21/19 1612 07/21/19 2210 07/22/19 0616 07/22/19 1117  GLUCAP 227* 188* 179* 188* 192*   Lipid Profile: No results for input(s): CHOL, HDL, LDLCALC, TRIG, CHOLHDL, LDLDIRECT in the last 72 hours. Thyroid Function Tests: No results for input(s): TSH, T4TOTAL, FREET4, T3FREE, THYROIDAB in the last 72 hours. Anemia Panel: No results for input(s): VITAMINB12, FOLATE, FERRITIN, TIBC, IRON, RETICCTPCT in the last 72 hours. Urine analysis:    Component Value Date/Time   COLORURINE YELLOW 07/07/2019 1409   APPEARANCEUR HAZY (A) 07/07/2019 1409   LABSPEC 1.014 07/07/2019 1409   PHURINE 5.0 07/07/2019 1409   GLUCOSEU NEGATIVE 07/07/2019 1409   HGBUR NEGATIVE 07/07/2019 1409   BILIRUBINUR NEGATIVE 07/07/2019 1409   KETONESUR NEGATIVE 07/07/2019 1409   PROTEINUR 100 (A) 07/07/2019 1409   UROBILINOGEN 0.2 10/07/2011 1203   NITRITE NEGATIVE 07/07/2019 1409   LEUKOCYTESUR SMALL (A) 07/07/2019 1409   Sepsis Labs: @LABRCNTIP (procalcitonin:4,lacticidven:4)  ) Recent Results (from the past 240 hour(s))  Culture, blood (routine x 2)     Status: None (Preliminary result)   Collection Time: 07/20/19  1:16 PM   Specimen: BLOOD RIGHT HAND  Result Value Ref Range Status   Specimen Description BLOOD RIGHT HAND  Final   Special Requests   Final    BOTTLES DRAWN AEROBIC ONLY Blood Culture adequate volume   Culture   Final    NO GROWTH 1 DAY Performed at [facility], [place] [address], [place], [place] [address]    Report Status PENDING  Incomplete  Culture, blood (routine x 2)     Status: None (Preliminary result)   Collection Time: 07/20/19  1:16 PM   Specimen: BLOOD RIGHT HAND  Result Value Ref Range Status   Specimen Description BLOOD RIGHT HAND  Final   Special Requests   Final    BOTTLES DRAWN AEROBIC ONLY Blood Culture adequate volume   Culture   Final    NO GROWTH 1 DAY Performed at [facility], [place] [address] [place], [place] [address]    Report Status PENDING  Incomplete      Studies: No results found.  Scheduled Meds: . aspirin EC  81 mg Oral QHS  . atorvastatin  20 mg Oral Daily  . calcium acetate  1,334 mg Oral TID WC  . carvedilol  25 mg Oral BID WC  . Chlorhexidine Gluconate Cloth  6 each  Topical Q0600  . cloNIDine  0.1 mg Oral BID  . darbepoetin (ARANESP) injection - DIALYSIS  150 mcg Intravenous Q Sat-HD  . finasteride  5 mg Oral QPM  . hydrALAZINE  100 mg Oral TID  . insulin aspart  0-9 Units Subcutaneous TID WC  . insulin glargine  6 Units Subcutaneous Daily  . isosorbide dinitrate  30 mg Oral TID  . multivitamin  1 tablet Oral QHS  . ranolazine  1,000 mg Oral BID  . tamsulosin  0.4 mg Oral QPC supper    Continuous Infusions:    LOS: 15 days     Kayleen Memos, MD [facility] Pager [phone]  If 7PM-7AM, please contact night-coverage [email or web address] Password [email or web address] 07/22/2019, 12:00 PM

## 2019-07-22 NOTE — Progress Notes (Signed)
  [facility] Progress Note    Assessment/ Plan:   1. New ESRD was CKD5; CLIP is [facility] MWF 2nd shift.  Next HD 07/21/2018--> will try to chip away at volume 2. L RC AVF, infiltrated, with TEMP HD cath at current time not able to cannulate 12/28 or 12/30 pursuing TDC 3. CAP, on unasyn B Cx NG 4. HTN, stable; needs vol down 5. Anemia: Getting Fe, ESA, CTM 6. CKD BMD: PhosLo, follow with HD.  PTH 144, stable  Subjective:    NAD, sitting in bed, for HD today.   Objective:   BP (!) 148/49 (BP Location: Right Arm)   Pulse (!) 58   Temp 98.6 F (37 C) (Oral)   Resp 15   Ht 6\' 3"  (1.905 m)   Wt 98.7 kg   SpO2 99%   BMI 27.20 kg/m   Intake/Output Summary (Last 24 hours) at 07/22/2019 1319 Last data filed at 07/22/2019 1114 Gross per 24 hour  Intake 620 ml  Output 350 ml  Net 270 ml   Weight change: -2.3 kg  Physical Exam: Gen: NAD CVS: RRR Resp: clear anteriorly Abd: nontender Ext: 2+ LE edema ACCESS: R IJ nontunneled HD cath, LUE AVF dressed  Imaging: No results found.  Labs: BMET Recent Labs  Lab 07/16/19 0534 07/18/19 0400 07/18/19 0744 07/19/19 0303 07/20/19 0336 07/21/19 0354 07/22/19 0324  NA 134* 129*  --  133* 130* 132* 130*  K 3.8 4.2  --  4.0 3.8 3.7 4.0  CL 97* 93*  --  94* 93* 94* 93*  CO2 23 22  --  25 24 25 25   GLUCOSE 200* 190*  --  251* 218* 219* 173*  BUN 100* 87*  --  52* 76* 48* 73*  CREATININE 5.89* 5.95*  --  4.48* 5.68* 4.88* 5.84*  CALCIUM 8.4* 8.6* 8.3* 8.6* 8.6* 8.3* 8.7*  PHOS 6.7*  --   --   --   --   --   --    CBC Recent Labs  Lab 07/19/19 0303 07/20/19 0336 07/21/19 0354 07/22/19 0324  WBC 19.9* 17.6* 16.5* 16.1*  NEUTROABS 13.5* 12.1* 11.4* 11.4*  HGB 10.3* 8.5* 8.4* 8.2*  HCT 31.7* 26.3* 25.0* 24.3*  MCV 94.3 94.6 94.3 93.5  PLT 421* 427* 422* 449*    Medications:    . aspirin EC  81 mg Oral QHS  . atorvastatin  20 mg Oral Daily  . calcium acetate  1,334 mg Oral TID WC  . carvedilol  25 mg Oral BID  WC  . Chlorhexidine Gluconate Cloth  6 each Topical Q0600  . cloNIDine  0.1 mg Oral BID  . darbepoetin (ARANESP) injection - DIALYSIS  150 mcg Intravenous Q Sat-HD  . finasteride  5 mg Oral QPM  . hydrALAZINE  100 mg Oral TID  . insulin aspart  0-9 Units Subcutaneous TID WC  . insulin glargine  6 Units Subcutaneous Daily  . isosorbide dinitrate  30 mg Oral TID  . multivitamin  1 tablet Oral QHS  . ranolazine  1,000 mg Oral BID  . tamsulosin  0.4 mg Oral QPC supper      Madelon Lips, MD 07/22/2019, 1:19 PM

## 2019-07-22 NOTE — Progress Notes (Signed)
ANTICOAGULATION CONSULT NOTE - Follow Up Consult  Pharmacy Consult for warfarin Indication: atrial fibrillation  No Known Allergies  Patient Measurements: Height: 6\' 3"  (190.5 cm) Weight: 217 lb 9.5 oz (98.7 kg) IBW/kg (Calculated) : 84.5 Heparin Dosing Weight: 217  Vital Signs: Temp: 98.5 F (36.9 C) (01/01 0832) Temp Source: Axillary (01/01 0832) BP: 122/52 (01/01 0832) Pulse Rate: 59 (01/01 0609)  Labs: Recent Labs    07/20/19 0336 07/21/19 0354 07/22/19 0324  HGB 8.5* 8.4* 8.2*  HCT 26.3* 25.0* 24.3*  PLT 427* 422* 449*  LABPROT 30.3* 25.4* 24.9*  INR 2.9* 2.3* 2.3*  CREATININE 5.68* 4.88* 5.84*    Estimated Creatinine Clearance: 11.9 mL/min (A) (by C-G formula based on SCr of 5.84 mg/dL (H)).   Medications:  Scheduled:  . aspirin EC  81 mg Oral QHS  . atorvastatin  20 mg Oral Daily  . calcium acetate  1,334 mg Oral TID WC  . carvedilol  25 mg Oral BID WC  . Chlorhexidine Gluconate Cloth  6 each Topical Q0600  . cloNIDine  0.1 mg Oral BID  . darbepoetin (ARANESP) injection - DIALYSIS  150 mcg Intravenous Q Sat-HD  . finasteride  5 mg Oral QPM  . hydrALAZINE  100 mg Oral TID  . insulin aspart  0-9 Units Subcutaneous TID WC  . insulin glargine  6 Units Subcutaneous Daily  . isosorbide dinitrate  30 mg Oral TID  . multivitamin  1 tablet Oral QHS  . ranolazine  1,000 mg Oral BID  . tamsulosin  0.4 mg Oral QPC supper    Assessment: patient is an 60 yom with a history of a fib. No anticoagulation PTA, was started on warfarin 12/20, which is now being held per IR for a TDC placement once INR is < 1.5. CHA2DS2-VASc score 6  INR today is 2.3 again. H&H remains stable, today at 8.2/24.3, plts are elevated at 449.  Goal of Therapy:  INR 2-3 after [facility] placement Monitor platelets by anticoagulation protocol: Yes   Plan:  Hold warfarin today Monitor daily INR and TDC placement to re-start warfarin    Thank you,   Eddie Candle, PharmD PGY-1 Pharmacy  Resident   Please check amion for clinical pharmacist contact number   07/22/2019,9:59 AM

## 2019-07-23 LAB — CBC WITH DIFFERENTIAL/PLATELET
Abs Immature Granulocytes: 0.11 10*3/uL — ABNORMAL HIGH (ref 0.00–0.07)
Basophils Absolute: 0 10*3/uL (ref 0.0–0.1)
Basophils Relative: 0 %
Eosinophils Absolute: 0.2 10*3/uL (ref 0.0–0.5)
Eosinophils Relative: 2 %
HCT: 25.5 % — ABNORMAL LOW (ref 39.0–52.0)
Hemoglobin: 8 g/dL — ABNORMAL LOW (ref 13.0–17.0)
Immature Granulocytes: 1 %
Lymphocytes Relative: 19 %
Lymphs Abs: 2.7 10*3/uL (ref 0.7–4.0)
MCH: 30.4 pg (ref 26.0–34.0)
MCHC: 31.4 g/dL (ref 30.0–36.0)
MCV: 97 fL (ref 80.0–100.0)
Monocytes Absolute: 1.4 10*3/uL — ABNORMAL HIGH (ref 0.1–1.0)
Monocytes Relative: 10 %
Neutro Abs: 9.6 10*3/uL — ABNORMAL HIGH (ref 1.7–7.7)
Neutrophils Relative %: 68 %
Platelets: 484 10*3/uL — ABNORMAL HIGH (ref 150–400)
RBC: 2.63 MIL/uL — ABNORMAL LOW (ref 4.22–5.81)
RDW: 15.1 % (ref 11.5–15.5)
WBC: 14 10*3/uL — ABNORMAL HIGH (ref 4.0–10.5)
nRBC: 0 % (ref 0.0–0.2)

## 2019-07-23 LAB — BASIC METABOLIC PANEL
Anion gap: 14 (ref 5–15)
BUN: 90 mg/dL — ABNORMAL HIGH (ref 8–23)
CO2: 23 mmol/L (ref 22–32)
Calcium: 8.7 mg/dL — ABNORMAL LOW (ref 8.9–10.3)
Chloride: 91 mmol/L — ABNORMAL LOW (ref 98–111)
Creatinine, Ser: 6.33 mg/dL — ABNORMAL HIGH (ref 0.61–1.24)
GFR calc Af Amer: 9 mL/min — ABNORMAL LOW (ref >60)
GFR calc non Af Amer: 8 mL/min — ABNORMAL LOW (ref >60)
Glucose, Bld: 171 mg/dL — ABNORMAL HIGH (ref 70–99)
Potassium: 4.2 mmol/L (ref 3.5–5.1)
Sodium: 128 mmol/L — ABNORMAL LOW (ref 135–145)

## 2019-07-23 LAB — PROTIME-INR
INR: 1.8 — ABNORMAL HIGH (ref 0.8–1.2)
Prothrombin Time: 20.8 seconds — ABNORMAL HIGH (ref 11.4–15.2)

## 2019-07-23 LAB — PROCALCITONIN: Procalcitonin: 0.71 ng/mL

## 2019-07-23 LAB — GLUCOSE, CAPILLARY
Glucose-Capillary: 161 mg/dL — ABNORMAL HIGH (ref 70–99)
Glucose-Capillary: 199 mg/dL — ABNORMAL HIGH (ref 70–99)
Glucose-Capillary: 163 mg/dL — ABNORMAL HIGH (ref 70–99)

## 2019-07-23 MED ORDER — DARBEPOETIN ALFA 150 MCG/0.3ML IJ SOSY
PREFILLED_SYRINGE | INTRAMUSCULAR | Status: AC
  Filled 2019-07-23: qty 0.3

## 2019-07-23 MED ORDER — HEPARIN SODIUM (PORCINE) 1000 UNIT/ML IJ SOLN
INTRAMUSCULAR | Status: AC
  Filled 2019-07-23: qty 3

## 2019-07-23 NOTE — Progress Notes (Signed)
  [facility] Progress Note    Assessment/ Plan:   1. New ESRD was CKD5; CLIP is [facility] MWF 2nd shift. HD 07/22/1008, next planned for Monday 07/24/2018 with healthy UF goal. 2. L RC AVF, infiltrated, with TEMP HD cath at current time not able to cannulate 12/28 or 12/30 pursuing TDC--> leukocytosis getting better, INR 1.8, will touch base with IR when leukocytosis normalized 3. CAP, on unasyn B Cx NG 4. HTN, stable; needs vol down 5. Anemia: Getting Fe, ESA, CTM 6. CKD BMD: PhosLo, follow with HD.  PTH 144, stable  Subjective:    S/p HD today with 3L UF.  Doing OK.    Objective:   BP (!) 141/59 (BP Location: Right Arm)   Pulse (!) 59   Temp 97.7 F (36.5 C) (Oral)   Resp 16   Ht 6\' 3"  (1.905 m)   Wt 96.7 kg   SpO2 100%   BMI 26.65 kg/m   Intake/Output Summary (Last 24 hours) at 07/23/2019 1053 Last data filed at 07/23/2019 1015 Gross per 24 hour  Intake 360 ml  Output 3675 ml  Net -3315 ml   Weight change: 0.3 kg  Physical Exam: Gen: NAD CVS: RRR Resp: clear anteriorly Abd: nontender Ext: 2+ LE edema ACCESS: R IJ nontunneled HD cath, LUE AVF dressed  Imaging: No results found.  Labs: BMET Recent Labs  Lab 07/18/19 0400 07/18/19 0744 07/19/19 0303 07/20/19 0336 07/21/19 0354 07/22/19 0324 07/23/19 0428  NA 129*  --  133* 130* 132* 130* 128*  K 4.2  --  4.0 3.8 3.7 4.0 4.2  CL 93*  --  94* 93* 94* 93* 91*  CO2 22  --  25 24 25 25 23   GLUCOSE 190*  --  251* 218* 219* 173* 171*  BUN 87*  --  52* 76* 48* 73* 90*  CREATININE 5.95*  --  4.48* 5.68* 4.88* 5.84* 6.33*  CALCIUM 8.6* 8.3* 8.6* 8.6* 8.3* 8.7* 8.7*   CBC Recent Labs  Lab 07/20/19 0336 07/21/19 0354 07/22/19 0324 07/23/19 0428  WBC 17.6* 16.5* 16.1* 14.0*  NEUTROABS 12.1* 11.4* 11.4* 9.6*  HGB 8.5* 8.4* 8.2* 8.0*  HCT 26.3* 25.0* 24.3* 25.5*  MCV 94.6 94.3 93.5 97.0  PLT 427* 422* 449* 484*    Medications:    . aspirin EC  81 mg Oral QHS  . atorvastatin  20 mg Oral Daily   . calcium acetate  1,334 mg Oral TID WC  . carvedilol  25 mg Oral BID WC  . Chlorhexidine Gluconate Cloth  6 each Topical Q0600  . cloNIDine  0.1 mg Oral BID  . Darbepoetin Alfa      . darbepoetin (ARANESP) injection - DIALYSIS  150 mcg Intravenous Q Sat-HD  . finasteride  5 mg Oral QPM  . heparin      . hydrALAZINE  100 mg Oral TID  . insulin aspart  0-9 Units Subcutaneous TID WC  . insulin glargine  6 Units Subcutaneous Daily  . isosorbide dinitrate  30 mg Oral TID  . multivitamin  1 tablet Oral QHS  . ranolazine  1,000 mg Oral BID  . tamsulosin  0.4 mg Oral QPC supper      Madelon Lips, MD 07/23/2019, 10:53 AM

## 2019-07-23 NOTE — Progress Notes (Signed)
ANTICOAGULATION CONSULT NOTE - Follow Up Consult  Pharmacy Consult for warfarin Indication: atrial fibrillation  No Known Allergies  Patient Measurements: Height: 6\' 3"  (190.5 cm) Weight: 219 lb 12.8 oz (99.7 kg) IBW/kg (Calculated) : 84.5 Heparin Dosing Weight: 217  Vital Signs: Temp: 97.9 F (36.6 C) (01/02 0633) Temp Source: Oral (01/02 1062) BP: 134/57 (01/02 1000) Pulse Rate: 60 (01/02 1000)  Labs: Recent Labs    07/21/19 0354 07/22/19 0324 07/23/19 0428  HGB 8.4* 8.2* 8.0*  HCT 25.0* 24.3* 25.5*  PLT 422* 449* 484*  LABPROT 25.4* 24.9* 20.8*  INR 2.3* 2.3* 1.8*  CREATININE 4.88* 5.84* 6.33*    Estimated Creatinine Clearance: 10.9 mL/min (A) (by C-G formula based on SCr of 6.33 mg/dL (H)).   Medications:  Scheduled:  Marland Kitchen Darbepoetin Alfa      . heparin      . aspirin EC  81 mg Oral QHS  . atorvastatin  20 mg Oral Daily  . calcium acetate  1,334 mg Oral TID WC  . carvedilol  25 mg Oral BID WC  . Chlorhexidine Gluconate Cloth  6 each Topical Q0600  . cloNIDine  0.1 mg Oral BID  . darbepoetin (ARANESP) injection - DIALYSIS  150 mcg Intravenous Q Sat-HD  . finasteride  5 mg Oral QPM  . hydrALAZINE  100 mg Oral TID  . insulin aspart  0-9 Units Subcutaneous TID WC  . insulin glargine  6 Units Subcutaneous Daily  . isosorbide dinitrate  30 mg Oral TID  . multivitamin  1 tablet Oral QHS  . ranolazine  1,000 mg Oral BID  . tamsulosin  0.4 mg Oral QPC supper    Assessment: Patient is an 84 yo male with a history of a fib.Patient was not on anticoagulation prior to admission. Started on warfarin 12/20, which is now being held per IR for a TDC placement once INR is < 1.5. CHA2DS2-VASc score 6. INR today is 1.8 which is suptherapeutic but intended based on IR recommendations for warfarin to be held. Hemoglobin 8.0. Plt 484. No reported bleeding.   Goal of Therapy:  INR 2-3 after [facility] placement Monitor platelets by anticoagulation protocol: Yes   Plan:  Hold  warfarin today Monitor daily INR and TDC placement to re-start warfarin  Follow up with Dr. Benny Lennert for desire for heparin bridge while holding warfarin   Thank you,   Cristela Felt, PharmD PGY1 Pharmacy Resident  Please check amion for clinical pharmacist contact number   07/23/2019,10:17 AM

## 2019-07-23 NOTE — Progress Notes (Signed)
PROGRESS NOTE  Ray Merritt TKZ:[id number] DOB: 01/14/1936 DOA: 07/07/2019 PCP: Ray Cha, MD  Brief History   84 year old gentleman with history of hypertension, CKD4 progressing to CKD 5, coronary artery disease with recent abnormal stress test, CLL with normal WBC count per oncology Dr. Earlie Server, insulin-dependent type 2 diabetes, who presented to the ED with complaints of gradually worsening shortness of breath and chest pain.  In the ED found to be febrile, elevated blood pressure, troponin S, and worsening creatinine.  EKG showing ST depression in inferior lateral leads.  Chest x-ray showing mild bibasilar interstitial prominence.  COVID-19 negative.  Cardiology and nephrology consulted started on hemodialysis, first HD on 07/09/2019.  Seen by cardiology and managed for NSTEMI.  Hospital course complicated by malfunction of AV fistula and slow to improve leukocytosis not on steroids.  Discussed with oncology, not related to his CLL since neutrophil is elevated.  Discussed with ID Dr. Tommy Medal who recommended to stop IV abx on 12/30.  Generalized weakness, PT assessment recommended SNF.  Patient declines and prefers to go home.  Tunneled dialysis catheter planned prior to discharge. IR would like improvement of leukocytosis prior to placement.  Repeated blood cultures peripherally which are negative to date.  Procalcitonin trending down.  Afebrile and no respiratory complaints.  Lactic acid negative.  Consultants  . Cardiology . Nephrology  Procedures  . Placement of temporary HD catheter  Antibiotics   Anti-infectives (From admission, onward)   Start     Dose/Rate Route Frequency Ordered Stop   07/13/19 0600  Ampicillin-Sulbactam (UNASYN) 3 g in sodium chloride 0.9 % 100 mL IVPB  Status:  Discontinued    Note to Pharmacy: Unasyn 3 g IV q24h for CrCL < 15 mL/min   3 g 200 mL/hr over 30 Minutes Intravenous Daily 07/13/19 0515 07/20/19 0910   07/11/19 1800   piperacillin-tazobactam (ZOSYN) IVPB 3.375 g  Status:  Discontinued     3.375 g 12.5 mL/hr over 240 Minutes Intravenous Every 12 hours 07/11/19 1601 07/13/19 0515   07/08/19 1400  cefTRIAXone (ROCEPHIN) 2 g in sodium chloride 0.9 % 100 mL IVPB  Status:  Discontinued     2 g 200 mL/hr over 30 Minutes Intravenous Every 24 hours 07/07/19 1644 07/11/19 1532   07/08/19 1400  azithromycin (ZITHROMAX) tablet 500 mg     500 mg Oral Daily 07/07/19 1644 07/11/19 1515   07/07/19 1700  cefTRIAXone (ROCEPHIN) 1 g in sodium chloride 0.9 % 100 mL IVPB     1 g 200 mL/hr over 30 Minutes Intravenous  Once 07/07/19 1656 07/07/19 1849   07/07/19 1200  cefTRIAXone (ROCEPHIN) 1 g in sodium chloride 0.9 % 100 mL IVPB     1 g 200 mL/hr over 30 Minutes Intravenous  Once 07/07/19 1155 07/07/19 1328   07/07/19 1200  azithromycin (ZITHROMAX) 500 mg in sodium chloride 0.9 % 250 mL IVPB     500 mg 250 mL/hr over 60 Minutes Intravenous  Once 07/07/19 1155 07/07/19 1507    .  Subjective  The patient is sitting up at bedside following HD. No new complaints.  Objective   Vitals:  Vitals:   07/23/19 1144 07/23/19 1640  BP: (!) 149/61 (!) 141/54  Pulse: (!) 58 (!) 58  Resp: 13 17  Temp: 98.3 F (36.8 C) 98.2 F (36.8 C)  SpO2: 99% 96%   Exam:  Constitutional:  . The patient is awake, alert, and oriented x 3. No acute distress. Respiratory:  . No increased  work of breathing. . No wheezes, rales, or rhonchi . No tactile fremitus Cardiovascular:  . Regular rate and rhythm . No murmurs, ectopy, or gallups. . No lateral PMI. No thrills. Abdomen:  . Abdomen is soft, non-tender, non-distended . No hernias, masses, or organomegaly . Normoactive bowel sounds.  Musculoskeletal:  . No cyanosis, clubbing, or edema Skin:  . No rashes, lesions, ulcers . palpation of skin: no induration or nodules Neurologic:  . CN 2-12 intact . Sensation all 4 extremities intact Psychiatric:  . Mental status o Mood,  affect appropriate o Orientation to person, place, time  . judgment and insight appear intact  I have personally reviewed the following:   Today's Data  . Vitals, BMP, CBC, PCT,   Micro Data  . Blood cultures x 2 (07/20/2019) no growth  Imaging  . CXR 12/17, 20, 22  Scheduled Meds: . aspirin EC  81 mg Oral QHS  . atorvastatin  20 mg Oral Daily  . calcium acetate  1,334 mg Oral TID WC  . carvedilol  25 mg Oral BID WC  . Chlorhexidine Gluconate Cloth  6 each Topical Q0600  . cloNIDine  0.1 mg Oral BID  . Darbepoetin Alfa      . darbepoetin (ARANESP) injection - DIALYSIS  150 mcg Intravenous Q Sat-HD  . finasteride  5 mg Oral QPM  . heparin      . hydrALAZINE  100 mg Oral TID  . insulin aspart  0-9 Units Subcutaneous TID WC  . insulin glargine  6 Units Subcutaneous Daily  . isosorbide dinitrate  30 mg Oral TID  . multivitamin  1 tablet Oral QHS  . ranolazine  1,000 mg Oral BID  . tamsulosin  0.4 mg Oral QPC supper   Continuous Infusions:  Principal Problem:   Sepsis ([place]) Active Problems:   CLL (chronic lymphocytic leukemia) (HCC)   Type 2 diabetes, HbA1c goal < 7% (HCC)   Acute renal failure superimposed on chronic kidney disease (HCC)   Acute on chronic combined systolic and diastolic CHF (congestive heart failure) (HCC)   Hypertensive urgency   Urinary tract infection   Abnormal EKG   Elevated troponin   Pressure injury of skin   LOS: 16 days   A & P  New ESRD with worsening CKD 5 on HD MWF: Awaiting tunneled catheter placement for dc. Nephrology consulted. Patient is set up for [name] on Monday Wednesday Friday schedule second shift at discharge. Awaiting IR decision to place [facility]; IR awaiting improvement of leukocytosis. Monitor. Patient seen following HD today.  Leukocytosis: Due to E. Faecalis CAP. Treatment completed. Down to 38 today. Procalcitonin also trending down. IR to be consulted once elevated WBC has resolved. Not due to CLL. WBC has been normal  with CLL per H/O.  Sepsis secondary to Enterococcous Faecalis community-acquired pneumonia, POA and Concurrent E. coli UTI, POA: Urine culture grew greater than 100,000 colonies of E. coli, pansensitive. Sputum culture grew abundant enterococous faecalis, pansensitive. The patient was started on azithromycin and ceftriaxone and completed 6 days of this therapy. On 07/12/2019 she was converted to Unasyn. Infectious disease was consulted and antibiotics were dc'd on 07/20/2019.   Enterococcus faecalis community-acquired pneumonia: Resolved. Completed course of antibiotics Afebrile. WBC remains elevated, but is declining as is procalcitonin. O2 saturation 99% on room air  Anemia of chronic disease in the setting of ESRD complicated by iron deficiency: Received 3 unit irradiated PRBCs.  Nephrology managing with ESA and iron repletion. No sign  of overt bleeding. Continue ESA and iron replacement as per nephrology.  Improving left knee effusion, recurrent: Reports left knee effusion, stating it has happened multiple times in the past and last arthrocentesis was many years ago.  Declined further evaluation.  Effusion resolving.  Nontender on palpation.  No erythema.  Hypervolemic hyponatremia: Volume managed by nephrology with hemodialysis. The p atient underwent HD today.  NSTEMI: Followed by cardiology.On aspirin and Lipitor. Continue cardiac medications as recommended by cardiology.  Paroxysmal A. Fib: CHA2DS2-VASc score 6. Anticoagulate with Coumadin per cardiology Continue warfarin, managed by pharmacy. INR is therapeutic 2.3.  BPH: Stable. Still makes urine. Continue home medications: Tamsulosin and finasteride  Acute on chronic combined systolic and diastolic CHF: Continue strict I's and O's and daily weight Continue cardiac medications. Volume managed by hemodialysis and IV Lasix as needed for volume overload. Net I&O -14.24 L.  Essential hypertension: Blood pressure is well  controlled on clonidine 0.1 mg bid, coreg 25 mg bid, hydralazine 100 mg tid, and isosorbid dinitrate 30 mg tid. Monitor.  CLL: Follow-up outpatient with oncology Dr. Earlie Server. Curbsided with Dr. Earlie Server.  Patient has a normal WBC with his CLL.  Last WBC 8K in August 2020 per oncology.  Physical debility, ambulatory dysfunction: PT assessment recommended SNF.  Patient desires to DC to home with family's assistance.  DME: [facility]ford Rile, hospital bed, wheelchair.Continue PT OT with assistance and fall precautions.  I have seen and examined this patient myself. I have spent 52 minutes in his evaluation and care. More than 50% of this was spent in counseling with his daughter, Melonie  DVT prophylaxis:Coumadin Code Status:DNR  Family Communication:Updated daughter via phone. Disposition Plan:Plan discharge to home with home health services PT OT RN after [facility] placement and when patient is hemodynamically stable. Martha Soltys, DO [facility] Direct contact: see [email or web address]  7PM-7AM contact night coverage as above 07/23/2019, 6:24 PM  LOS: 16 days

## 2019-07-24 LAB — BASIC METABOLIC PANEL
Anion gap: 11 (ref 5–15)
BUN: 54 mg/dL — ABNORMAL HIGH (ref 8–23)
CO2: 26 mmol/L (ref 22–32)
Calcium: 8.7 mg/dL — ABNORMAL LOW (ref 8.9–10.3)
Chloride: 94 mmol/L — ABNORMAL LOW (ref 98–111)
Creatinine, Ser: 4.67 mg/dL — ABNORMAL HIGH (ref 0.61–1.24)
GFR calc Af Amer: 13 mL/min — ABNORMAL LOW (ref >60)
GFR calc non Af Amer: 11 mL/min — ABNORMAL LOW (ref >60)
Glucose, Bld: 166 mg/dL — ABNORMAL HIGH (ref 70–99)
Potassium: 4 mmol/L (ref 3.5–5.1)
Sodium: 131 mmol/L — ABNORMAL LOW (ref 135–145)

## 2019-07-24 LAB — CBC WITH DIFFERENTIAL/PLATELET
Abs Immature Granulocytes: 0.09 10*3/uL — ABNORMAL HIGH (ref 0.00–0.07)
Basophils Absolute: 0.1 10*3/uL (ref 0.0–0.1)
Basophils Relative: 0 %
Eosinophils Absolute: 0.2 10*3/uL (ref 0.0–0.5)
Eosinophils Relative: 1 %
HCT: 27 % — ABNORMAL LOW (ref 39.0–52.0)
Hemoglobin: 8.5 g/dL — ABNORMAL LOW (ref 13.0–17.0)
Immature Granulocytes: 1 %
Lymphocytes Relative: 16 %
Lymphs Abs: 2.2 10*3/uL (ref 0.7–4.0)
MCH: 30.6 pg (ref 26.0–34.0)
MCHC: 31.5 g/dL (ref 30.0–36.0)
MCV: 97.1 fL (ref 80.0–100.0)
Monocytes Absolute: 1.9 10*3/uL — ABNORMAL HIGH (ref 0.1–1.0)
Monocytes Relative: 14 %
Neutro Abs: 9.6 10*3/uL — ABNORMAL HIGH (ref 1.7–7.7)
Neutrophils Relative %: 68 %
Platelets: 512 10*3/uL — ABNORMAL HIGH (ref 150–400)
RBC: 2.78 MIL/uL — ABNORMAL LOW (ref 4.22–5.81)
RDW: 14.9 % (ref 11.5–15.5)
WBC: 14.1 10*3/uL — ABNORMAL HIGH (ref 4.0–10.5)
nRBC: 0 % (ref 0.0–0.2)

## 2019-07-24 LAB — GLUCOSE, CAPILLARY
Glucose-Capillary: 158 mg/dL — ABNORMAL HIGH (ref 70–99)
Glucose-Capillary: 124 mg/dL — ABNORMAL HIGH (ref 70–99)
Glucose-Capillary: 91 mg/dL (ref 70–99)
Glucose-Capillary: 98 mg/dL (ref 70–99)

## 2019-07-24 LAB — PROTIME-INR
INR: 1.4 — ABNORMAL HIGH (ref 0.8–1.2)
Prothrombin Time: 17.4 seconds — ABNORMAL HIGH (ref 11.4–15.2)

## 2019-07-24 MED ORDER — SALINE SPRAY 0.65 % NA SOLN
2.0000 | NASAL | Status: DC | PRN
  Filled 2019-07-24: qty 44

## 2019-07-24 NOTE — Progress Notes (Signed)
ANTICOAGULATION CONSULT NOTE - Follow Up Consult  Pharmacy Consult for warfarin Indication: atrial fibrillation  No Known Allergies  Patient Measurements: Height: 6\' 3"  (190.5 cm) Weight: 213 lb 3 oz (96.7 kg) IBW/kg (Calculated) : 84.5 Heparin Dosing Weight: 217  Vital Signs: Temp: 98.2 F (36.8 C) (01/03 1151) Temp Source: Oral (01/03 1151) BP: 134/61 (01/03 1151) Pulse Rate: 66 (01/03 1151)  Labs: Recent Labs    07/22/19 0324 07/23/19 0428 07/24/19 0351  HGB 8.2* 8.0* 8.5*  HCT 24.3* 25.5* 27.0*  PLT 449* 484* 512*  LABPROT 24.9* 20.8* 17.4*  INR 2.3* 1.8* 1.4*  CREATININE 5.84* 6.33* 4.67*    Estimated Creatinine Clearance: 14.8 mL/min (A) (by C-G formula based on SCr of 4.67 mg/dL (H)).   Medications:  Scheduled:  . aspirin EC  81 mg Oral QHS  . atorvastatin  20 mg Oral Daily  . calcium acetate  1,334 mg Oral TID WC  . carvedilol  25 mg Oral BID WC  . Chlorhexidine Gluconate Cloth  6 each Topical Q0600  . cloNIDine  0.1 mg Oral BID  . darbepoetin (ARANESP) injection - DIALYSIS  150 mcg Intravenous Q Sat-HD  . finasteride  5 mg Oral QPM  . hydrALAZINE  100 mg Oral TID  . insulin aspart  0-9 Units Subcutaneous TID WC  . insulin glargine  6 Units Subcutaneous Daily  . isosorbide dinitrate  30 mg Oral TID  . multivitamin  1 tablet Oral QHS  . ranolazine  1,000 mg Oral BID  . tamsulosin  0.4 mg Oral QPC supper    Assessment: Patient is an 84 yo male with a history of a fib.Patient was not on anticoagulation prior to admission. Started on warfarin 12/20, which is now being held per IR for a TDC placement once INR is < 1.5. CHA2DS2-VASc score 6. INR today is 1.4 which is suptherapeutic but intended based on IR recommendations for warfarin to be held. Hemoglobin 8.5. Plt 512. No reported bleeding.   Goal of Therapy:  INR 2-3 after [facility] placement Monitor platelets by anticoagulation protocol: Yes   Plan:  Hold warfarin today Monitor daily INR and TDC  placement to re-start warfarin  No heparin bridge while holding warfarin per Dr. Benny Lennert  Thank you,   Sherren Kerns, PharmD PGY1 Acute Care Pharmacy Resident Please check amion for clinical pharmacist contact number 07/24/2019,11:57 AM

## 2019-07-24 NOTE — Progress Notes (Signed)
Pt stated he was having chest pain. 12-Lead EKG performed with no significant change from previous results.  Reached out to CCMD and they stated he was NSR with an occasional PVC. No other symptoms to indicate arrhythmia or MI.  All vitals normal for Pt's baseline.  MEW score green. Swayze DO notified.  No new orders at this time.  Pt will continue to be monitored.

## 2019-07-24 NOTE — Progress Notes (Addendum)
PROGRESS NOTE  Ray Merritt QQI:[id number] DOB: 01-09-1936 DOA: 07/07/2019 PCP: [name], MD  Brief History   84 year old gentleman with history of hypertension, CKD4 progressing to CKD 5, coronary artery disease with recent abnormal stress test, CLL with normal WBC count per oncology Dr. Earlie Server, insulin-dependent type 2 diabetes, who presented to the ED with complaints of gradually worsening shortness of breath and chest pain.  In the ED found to be febrile, elevated blood pressure, troponin S, and worsening creatinine.  EKG showing ST depression in inferior lateral leads.  Chest x-ray showing mild bibasilar interstitial prominence.  COVID-19 negative.  Cardiology and nephrology consulted started on hemodialysis, first HD on 07/09/2019.  Seen by cardiology and managed for NSTEMI.  Hospital course complicated by malfunction of AV fistula and slow to improve leukocytosis not on steroids.  Discussed with oncology, not related to his CLL since neutrophil is elevated.  Discussed with ID Dr. Tommy Medal who recommended to stop IV abx on 12/30.  Generalized weakness, PT assessment recommended SNF.  Patient declines and prefers to go home.  Tunneled dialysis catheter planned prior to discharge. IR would like improvement of leukocytosis prior to placement.  Repeated blood cultures peripherally which are negative to date.  Procalcitonin trending down.  Afebrile and no respiratory complaints.  Lactic acid negative.  He is awaiting placement of a tunned HD catheter. IR wanted to wait until until WBC is resolved. WBC continues to by 14.1.  Consultants  . Cardiology . Nephrology  Procedures  . Placement of temporary HD catheter  Antibiotics   Anti-infectives (From admission, onward)   Start     Dose/Rate Route Frequency Ordered Stop   07/13/19 0600  Ampicillin-Sulbactam (UNASYN) 3 g in sodium chloride 0.9 % 100 mL IVPB  Status:  Discontinued    Note to Pharmacy: Unasyn 3 g IV q24h for CrCL <  15 mL/min   3 g 200 mL/hr over 30 Minutes Intravenous Daily 07/13/19 0515 07/20/19 0910   07/11/19 1800  piperacillin-tazobactam (ZOSYN) IVPB 3.375 g  Status:  Discontinued     3.375 g 12.5 mL/hr over 240 Minutes Intravenous Every 12 hours 07/11/19 1601 07/13/19 0515   07/08/19 1400  cefTRIAXone (ROCEPHIN) 2 g in sodium chloride 0.9 % 100 mL IVPB  Status:  Discontinued     2 g 200 mL/hr over 30 Minutes Intravenous Every 24 hours 07/07/19 1644 07/11/19 1532   07/08/19 1400  azithromycin (ZITHROMAX) tablet 500 mg     500 mg Oral Daily 07/07/19 1644 07/11/19 1515   07/07/19 1700  cefTRIAXone (ROCEPHIN) 1 g in sodium chloride 0.9 % 100 mL IVPB     1 g 200 mL/hr over 30 Minutes Intravenous  Once 07/07/19 1656 07/07/19 1849   07/07/19 1200  cefTRIAXone (ROCEPHIN) 1 g in sodium chloride 0.9 % 100 mL IVPB     1 g 200 mL/hr over 30 Minutes Intravenous  Once 07/07/19 1155 07/07/19 1328   07/07/19 1200  azithromycin (ZITHROMAX) 500 mg in sodium chloride 0.9 % 250 mL IVPB     500 mg 250 mL/hr over 60 Minutes Intravenous  Once 07/07/19 1155 07/07/19 1507     Subjective  The patient is sitting up at bedside following HD. No new complaints.  Objective   Vitals:  Vitals:   07/24/19 1104 07/24/19 1151  BP: (!) 153/59 134/61  Pulse:  66  Resp:  20  Temp:  98.2 F (36.8 C)  SpO2:  98%   Exam:  Constitutional:  .  The patient is awake, alert, and oriented x 3. No acute distress. Respiratory:  . No increased work of breathing. . No wheezes, rales, or rhonchi . No tactile fremitus Cardiovascular:  . Regular rate and rhythm . No murmurs, ectopy, or gallups. . No lateral PMI. No thrills. Abdomen:  . Abdomen is soft, non-tender, non-distended . No hernias, masses, or organomegaly . Normoactive bowel sounds.  Musculoskeletal:  . No cyanosis, clubbing, or edema Skin:  . No rashes, lesions, ulcers . palpation of skin: no induration or nodules Neurologic:  . CN 2-12 intact . Sensation  all 4 extremities intact Psychiatric:  . Mental status o Mood, affect appropriate o Orientation to person, place, time  . judgment and insight appear intact  I have personally reviewed the following:   Today's Data  . Vitals, BMP, CBC   Micro Data  . Blood cultures x 2 (07/20/2019) no growth  Imaging  . CXR 12/17, 20, 22  Scheduled Meds: . aspirin EC  81 mg Oral QHS  . atorvastatin  20 mg Oral Daily  . calcium acetate  1,334 mg Oral TID WC  . carvedilol  25 mg Oral BID WC  . Chlorhexidine Gluconate Cloth  6 each Topical Q0600  . cloNIDine  0.1 mg Oral BID  . darbepoetin (ARANESP) injection - DIALYSIS  150 mcg Intravenous Q Sat-HD  . finasteride  5 mg Oral QPM  . hydrALAZINE  100 mg Oral TID  . insulin aspart  0-9 Units Subcutaneous TID WC  . insulin glargine  6 Units Subcutaneous Daily  . isosorbide dinitrate  30 mg Oral TID  . multivitamin  1 tablet Oral QHS  . ranolazine  1,000 mg Oral BID  . tamsulosin  0.4 mg Oral QPC supper   Continuous Infusions:  Principal Problem:   Sepsis ([place]) Active Problems:   CLL (chronic lymphocytic leukemia) (HCC)   Type 2 diabetes, HbA1c goal < 7% (HCC)   Acute renal failure superimposed on chronic kidney disease (HCC)   Acute on chronic combined systolic and diastolic CHF (congestive heart failure) (HCC)   Hypertensive urgency   Urinary tract infection   Abnormal EKG   Elevated troponin   Pressure injury of skin   LOS: 17 days   A & P  New ESRD with worsening CKD 5 on HD MWF: Awaiting tunneled catheter placement for dc. Nephrology consulted. Patient is set up for [facility] on Monday Wednesday Friday schedule second shift at discharge. Awaiting IR decision to place [facility]; IR awaiting improvement of leukocytosis. Monitor. Patient seen following HD today.  Leukocytosis: Due to E. Faecalis CAP. Treatment completed. Down to 14.1 today. Procalcitonin also trending down. IR to be consulted once elevated WBC has resolved. Not due to  CLL. WBC has been normal with CLL per H/O.  Sepsis secondary to Enterococcous Faecalis community-acquired pneumonia, POA and Concurrent E. coli UTI, POA: Urine culture grew greater than 100,000 colonies of E. coli, pansensitive. Sputum culture grew abundant enterococous faecalis, pansensitive. The patient was started on azithromycin and ceftriaxone and completed 6 days of this therapy. On 07/12/2019 she was converted to Unasyn. Infectious disease was consulted and antibiotics were dc'd on 07/20/2019.   Enterococcus faecalis community-acquired pneumonia: Resolved. Completed course of antibiotics Afebrile. WBC remains elevated, but is declining as is procalcitonin. O2 saturation 99% on room air  Anemia of chronic disease in the setting of ESRD complicated by iron deficiency: Received 3 unit irradiated PRBCs.  Nephrology managing with ESA and iron repletion. No  sign of overt bleeding. Continue ESA and iron replacement as per nephrology.  Improving left knee effusion, recurrent: Reports left knee effusion, stating it has happened multiple times in the past and last arthrocentesis was many years ago.  Declined further evaluation.  Effusion resolving.  Nontender on palpation.  No erythema.  Hypervolemic hyponatremia: Volume managed by nephrology with hemodialysis. The p atient underwent HD today.  NSTEMI: Followed by cardiology.On aspirin and Lipitor. Continue cardiac medications as recommended by cardiology.  Paroxysmal A. Fib: CHA2DS2-VASc score 6. Anticoagulate with Coumadin per cardiology Continue warfarin, managed by pharmacy. INR is therapeutic 2.3.  BPH: Stable. Still makes urine. Continue home medications: Tamsulosin and finasteride  Acute on chronic combined systolic and diastolic CHF: Continue strict I's and O's and daily weight Continue cardiac medications. Volume managed by hemodialysis and IV Lasix as needed for volume overload. Net I&O -14.24 L.  Essential hypertension:  Blood pressure is well controlled on clonidine 0.1 mg bid, coreg 25 mg bid, hydralazine 100 mg tid, and isosorbid dinitrate 30 mg tid. Monitor.  CLL: Follow-up outpatient with oncology Dr. Earlie Server. Curbsided with Dr. Earlie Server.  Patient has a normal WBC with his CLL.  Last WBC 8K in August 2020 per oncology.  Physical debility, ambulatory dysfunction: PT assessment recommended SNF.  Patient desires to DC to home with family's assistance.  DME: [facility] Rile, hospital bed, wheelchair.Continue PT OT with assistance and fall precautions.  I have seen and examined this patient myself. I have spent 32 minutes in his evaluation and care.   DVT prophylaxis:Coumadin Code Status:DNR  Family Communication:None available. Disposition Plan:Plan discharge to home with home health services PT OT RN after [facility] placement and when patient is hemodynamically stable. Malcom Selmer, DO [facility] Direct contact: see [email or web address]  7PM-7AM contact night coverage as above 07/24/2019, 1:18 PM  LOS: 16 days   ADDENDUM: I was contacted by nursing regarding a change in the patient's status. I was told that the patient was moaning, and was not as alert and interactive as he had been previously. The patient had received tylenol for a headache. He also had a twitch in his left upper extremity. Vital signs were stable. The patient had not eaten much lunch. Glucoses had been stable.   The patient awakened to voice. He followed commands. He did require repeated reminding to keep his eyes open. When asked what was bothering him, he responded that his head hurt. He had a normal neurological exam. His abdomen was soft, non-tender, but tympanic. The patient seemed to indicate that he had some nasal congestion. I will get him some nasal saline. I do not want to complicate the patient's mental status by giving him a narcotic. We will continue to monitor him closely.

## 2019-07-24 NOTE — Progress Notes (Signed)
Pt developed significant change from baseline.  Pt developed Intermittent confusion, difficulty communicating and interacting, inability to eat, and began moaning.  Pt had a headache he rated a "10" and body twitches were markedly increased.  Upon assessment all vitals were normal for Pt's baseline.  Pt CBG normal.  Neurological exam unremarkable.  Swayze DO notified and came to room to examine PT.  No new orders other than nasal saline at this time.  Pt will continue to be monitored.

## 2019-07-24 NOTE — Progress Notes (Signed)
Consult placed to check temp HD catheter d/t fear of being pulled out. GBR from pigtail. Catheter is 20 cm and is out 1 cm. Considering the length and ability to remove blood indicated line is still in appropriate position. Dressing changed. Plan is for patient to go tomorrow for tunneled HD catheter. Therefore, new line will probably be used tomorrow for HD.

## 2019-07-24 NOTE — Progress Notes (Signed)
IR requested by Dr. Joelyn Oms and Dr. Hollie Salk for possible image-guided nontunneled to tunneled HD catheter conversion.  Procedure was on hold pending INR- 1.4 today. Plan for image-guided nontunneled to tunneled HD catheter conversion in IR tentatively for tomorrow pending scheduling. Patient to be NPO at midnight. PA to see patient for consent prior to procedure in AM.  Please call IR with questions/concerns.   Bea Graff Alfonzia Woolum, PA-C 07/24/2019, 12:20 PM

## 2019-07-24 NOTE — Progress Notes (Signed)
  [facility] Progress Note    Assessment/ Plan:   1. 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 HD 07/23/2019, next planned for Monday 07/24/2018 with healthy UF goal.  2. L RC AVF, infiltrated, with TEMP HD cath at current time not able to cannulate 12/28 or 12/30. pursuing TDC--> was on hold d/t INR and leukocytosis, both are getting better, NPO past MN tonight and TDC order with IR entered.  3. CAP, s/p antibiotics  4. HTN, stable--> expect to improve with UF  5. Anemia: Aranesp q Saturyda  6. CKD BMD: PhosLo, follow with HD.  PTH 144, stable  7. Dispo: Ok to d/c from renal perspective when [facility] in  Subjective:    Doing OK this AM, dressing came off nontunneled HD cath.    Objective:   BP 134/61 (BP Location: Right Arm)   Pulse 66   Temp 98.2 F (36.8 C) (Oral)   Resp 20   Ht 6\' 3"  (1.905 m)   Wt 96.7 kg   SpO2 98%   BMI 26.65 kg/m   Intake/Output Summary (Last 24 hours) at 07/24/2019 1216 Last data filed at 07/24/2019 0050 Gross per 24 hour  Intake 120 ml  Output 525 ml  Net -405 ml   Weight change: -2.3 kg  Physical Exam: Gen: NAD CVS: RRR Resp: clear anteriorly Abd: nontender Ext: 2+ LE edema ACCESS: R IJ nontunneled HD cath, LUE AVF dressed  Imaging: No results found.  Labs: BMET Recent Labs  Lab 07/18/19 0400 07/18/19 0744 07/19/19 0303 07/20/19 0336 07/21/19 0354 07/22/19 0324 07/23/19 0428 07/24/19 0351  NA 129*  --  133* 130* 132* 130* 128* 131*  K 4.2  --  4.0 3.8 3.7 4.0 4.2 4.0  CL 93*  --  94* 93* 94* 93* 91* 94*  CO2 22  --  25 24 25 25 23 26   GLUCOSE 190*  --  251* 218* 219* 173* 171* 166*  BUN 87*  --  52* 76* 48* 73* 90* 54*  CREATININE 5.95*  --  4.48* 5.68* 4.88* 5.84* 6.33* 4.67*  CALCIUM 8.6* 8.3* 8.6* 8.6* 8.3* 8.7* 8.7* 8.7*   CBC Recent Labs  Lab 07/21/19 0354 07/22/19 0324 07/23/19 0428 07/24/19 0351  WBC 16.5* 16.1* 14.0* 14.1*  NEUTROABS 11.4* 11.4* 9.6* 9.6*  HGB 8.4* 8.2* 8.0* 8.5*  HCT  25.0* 24.3* 25.5* 27.0*  MCV 94.3 93.5 97.0 97.1  PLT 422* 449* 484* 512*    Medications:    . aspirin EC  81 mg Oral QHS  . atorvastatin  20 mg Oral Daily  . calcium acetate  1,334 mg Oral TID WC  . carvedilol  25 mg Oral BID WC  . Chlorhexidine Gluconate Cloth  6 each Topical Q0600  . cloNIDine  0.1 mg Oral BID  . darbepoetin (ARANESP) injection - DIALYSIS  150 mcg Intravenous Q Sat-HD  . finasteride  5 mg Oral QPM  . hydrALAZINE  100 mg Oral TID  . insulin aspart  0-9 Units Subcutaneous TID WC  . insulin glargine  6 Units Subcutaneous Daily  . isosorbide dinitrate  30 mg Oral TID  . multivitamin  1 tablet Oral QHS  . ranolazine  1,000 mg Oral BID  . tamsulosin  0.4 mg Oral QPC supper      Madelon Lips, MD 07/24/2019, 12:16 PM

## 2019-07-25 ENCOUNTER — Inpatient Hospital Stay (HOSPITAL_COMMUNITY): Payer: Medicare Other

## 2019-07-25 HISTORY — PX: IR FLUORO GUIDE CV LINE RIGHT: IMG2283

## 2019-07-25 LAB — CULTURE, BLOOD (ROUTINE X 2)
Culture: NO GROWTH
Culture: NO GROWTH
Special Requests: ADEQUATE
Special Requests: ADEQUATE

## 2019-07-25 LAB — CBC WITH DIFFERENTIAL/PLATELET
Abs Immature Granulocytes: 0.09 10*3/uL — ABNORMAL HIGH (ref 0.00–0.07)
Basophils Absolute: 0 10*3/uL (ref 0.0–0.1)
Basophils Relative: 0 %
Eosinophils Absolute: 0.1 10*3/uL (ref 0.0–0.5)
Eosinophils Relative: 1 %
HCT: 25.3 % — ABNORMAL LOW (ref 39.0–52.0)
Hemoglobin: 8 g/dL — ABNORMAL LOW (ref 13.0–17.0)
Immature Granulocytes: 1 %
Lymphocytes Relative: 14 %
Lymphs Abs: 2.1 10*3/uL (ref 0.7–4.0)
MCH: 30.8 pg (ref 26.0–34.0)
MCHC: 31.6 g/dL (ref 30.0–36.0)
MCV: 97.3 fL (ref 80.0–100.0)
Monocytes Absolute: 2 10*3/uL — ABNORMAL HIGH (ref 0.1–1.0)
Monocytes Relative: 13 %
Neutro Abs: 11 10*3/uL — ABNORMAL HIGH (ref 1.7–7.7)
Neutrophils Relative %: 71 %
Platelets: 498 10*3/uL — ABNORMAL HIGH (ref 150–400)
RBC: 2.6 MIL/uL — ABNORMAL LOW (ref 4.22–5.81)
RDW: 14.7 % (ref 11.5–15.5)
WBC: 15.4 10*3/uL — ABNORMAL HIGH (ref 4.0–10.5)
nRBC: 0 % (ref 0.0–0.2)

## 2019-07-25 LAB — BASIC METABOLIC PANEL
Anion gap: 13 (ref 5–15)
BUN: 72 mg/dL — ABNORMAL HIGH (ref 8–23)
CO2: 24 mmol/L (ref 22–32)
Calcium: 9.1 mg/dL (ref 8.9–10.3)
Chloride: 94 mmol/L — ABNORMAL LOW (ref 98–111)
Creatinine, Ser: 5.82 mg/dL — ABNORMAL HIGH (ref 0.61–1.24)
GFR calc Af Amer: 10 mL/min — ABNORMAL LOW (ref >60)
GFR calc non Af Amer: 8 mL/min — ABNORMAL LOW (ref >60)
Glucose, Bld: 99 mg/dL (ref 70–99)
Potassium: 4.2 mmol/L (ref 3.5–5.1)
Sodium: 131 mmol/L — ABNORMAL LOW (ref 135–145)

## 2019-07-25 LAB — PROTIME-INR
INR: 1.5 — ABNORMAL HIGH (ref 0.8–1.2)
Prothrombin Time: 18 seconds — ABNORMAL HIGH (ref 11.4–15.2)

## 2019-07-25 LAB — GLUCOSE, CAPILLARY
Glucose-Capillary: 96 mg/dL (ref 70–99)
Glucose-Capillary: 97 mg/dL (ref 70–99)
Glucose-Capillary: 137 mg/dL — ABNORMAL HIGH (ref 70–99)

## 2019-07-25 MED ORDER — WARFARIN SODIUM 7.5 MG PO TABS
7.5000 mg | ORAL_TABLET | Freq: Once | ORAL | Status: AC
  Administered 2019-07-25: 18:00:00 7.5 mg via ORAL
  Filled 2019-07-25: qty 1

## 2019-07-25 MED ORDER — CEFAZOLIN SODIUM-DEXTROSE 2-4 GM/100ML-% IV SOLN
2.0000 g | INTRAVENOUS | Status: AC
  Filled 2019-07-25: qty 100

## 2019-07-25 MED ORDER — MIDAZOLAM HCL 2 MG/2ML IJ SOLN
INTRAMUSCULAR | Status: AC
  Filled 2019-07-25: qty 2

## 2019-07-25 MED ORDER — LIDOCAINE HCL 1 % IJ SOLN
INTRAMUSCULAR | Status: AC | PRN
  Administered 2019-07-25: 5 mL

## 2019-07-25 MED ORDER — LIDOCAINE HCL 1 % IJ SOLN
INTRAMUSCULAR | Status: AC
  Filled 2019-07-25: qty 20

## 2019-07-25 MED ORDER — FENTANYL CITRATE (PF) 100 MCG/2ML IJ SOLN
INTRAMUSCULAR | Status: AC
  Filled 2019-07-25: qty 2

## 2019-07-25 MED ORDER — CHLORHEXIDINE GLUCONATE 4 % EX LIQD
CUTANEOUS | Status: AC
  Filled 2019-07-25: qty 15

## 2019-07-25 MED ORDER — HEPARIN SODIUM (PORCINE) 1000 UNIT/ML IJ SOLN
INTRAMUSCULAR | Status: AC
  Administered 2019-07-25: 10:00:00 3.2 mL
  Filled 2019-07-25: qty 1

## 2019-07-25 MED ORDER — HEPARIN SODIUM (PORCINE) 1000 UNIT/ML IJ SOLN
INTRAMUSCULAR | Status: AC
  Filled 2019-07-25: qty 4

## 2019-07-25 MED ORDER — WARFARIN - PHARMACIST DOSING INPATIENT: Freq: Every day | Status: DC

## 2019-07-25 NOTE — Progress Notes (Signed)
ANTICOAGULATION CONSULT NOTE - Follow Up Consult  Pharmacy Consult for Coumadin Indication: atrial fibrillation  No Known Allergies  Patient Measurements: Height: 6\' 3"  (190.5 cm) Weight: 209 lb 10.5 oz (95.1 kg) IBW/kg (Calculated) : 84.5  Vital Signs: Temp: 99.1 F (37.3 C) (01/04 1001) Temp Source: Oral (01/04 1001) BP: 147/47 (01/04 1001) Pulse Rate: 67 (01/04 1001)  Labs: Recent Labs    07/23/19 0428 07/24/19 0351 07/25/19 0321  HGB 8.0* 8.5* 8.0*  HCT 25.5* 27.0* 25.3*  PLT 484* 512* 498*  LABPROT 20.8* 17.4* 18.0*  INR 1.8* 1.4* 1.5*  CREATININE 6.33* 4.67* 5.82*    Estimated Creatinine Clearance: 11.9 mL/min (A) (by C-G formula based on SCr of 5.82 mg/dL (H)).  Assessment:  Anticoag: Warf for new onset AFib - no AC PTA.  INR 1.5 today. CBC stable. No reported bleeding. CHA2DS2-VASc score 6 - No desire to bridge per [place]. Not in Afib. Ok to resume Coumadin 1/4 per Dr. Benny Lennert  Goal of Therapy:  INR 2-3 Monitor platelets by anticoagulation protocol: Yes   Plan:  Coumadin 7.5mg  po x 1 tonight - Tunnelled HD catheter today Daily PT/INR Aranesp is Q Sat   Marvel Sapp S. Alford Highland, PharmD, BCPS Clinical Staff Pharmacist [email or web address] Alford Highland, [employer] 07/25/2019,10:23 AM

## 2019-07-25 NOTE — Sedation Documentation (Signed)
Non sedation case.  Only gave .5mg  Versed

## 2019-07-25 NOTE — Progress Notes (Signed)
Pt received from IR. VSS. R chest tunneled catheter in place, drsg clean, dry and intact. Breakfast tray ordered for pt.  Clyde Canterbury, RN

## 2019-07-25 NOTE — Care Management Important Message (Signed)
Important Message  Patient Details  Name: Ray Merritt MRN: [id number] Date of Birth: December 29, 1935   Medicare Important Message Given:  Yes     Shelda Altes 07/25/2019, 2:24 PM

## 2019-07-25 NOTE — Progress Notes (Signed)
PT Cancellation Note  Patient Details Name: Ray Merritt MRN: [id number] DOB: 1936/05/29   Cancelled Treatment:    Reason Eval/Treat Not Completed: Patient at procedure or test/unavailable Pt off floor for procedure and then dialysis.  Will follow up as able. Maggie Font, PT Acute Rehab Services Pager [phone] [facility] [phone]    Karlton Lemon 07/25/2019, 2:00 PM

## 2019-07-25 NOTE — Progress Notes (Signed)
PROGRESS NOTE  Ray Merritt MAU:[id number] DOB: 1935-08-16 DOA: 07/07/2019 PCP: [name], MD  Brief History   84 year old gentleman with history of hypertension, CKD4 progressing to CKD 5, coronary artery disease with recent abnormal stress test, CLL with normal WBC count per oncology Dr. Earlie Server, insulin-dependent type 2 diabetes, who presented to the ED with complaints of gradually worsening shortness of breath and chest pain.  In the ED found to be febrile, elevated blood pressure, troponin S, and worsening creatinine.  EKG showing ST depression in inferior lateral leads.  Chest x-ray showing mild bibasilar interstitial prominence.  COVID-19 negative.  Cardiology and nephrology consulted started on hemodialysis, first HD on 07/09/2019.  Seen by cardiology and managed for NSTEMI.  Hospital course complicated by malfunction of AV fistula and slow to improve leukocytosis not on steroids.  Discussed with oncology, not related to his CLL since neutrophil is elevated.  Discussed with ID Dr. Tommy Medal who recommended to stop IV abx on 12/30.  Generalized weakness, PT assessment recommended SNF.  Patient declines and prefers to go home.  Tunneled dialysis catheter planned prior to discharge. IR would like improvement of leukocytosis prior to placement.  Repeated blood cultures peripherally which are negative to date.  Procalcitonin trending down.  Afebrile and no respiratory complaints.  Lactic acid negative.  Tunneled catheter placed today. HD here today. Will dc to home tomorrow.  Consultants  . Cardiology . Nephrology  Procedures  . Placement of temporary HD catheter . Conversion of temporary HD catheter to a tunneled HD catheter by IR.  Antibiotics   Anti-infectives (From admission, onward)   Start     Dose/Rate Route Frequency Ordered Stop   07/25/19 0900  ceFAZolin (ANCEF) IVPB 2g/100 mL premix     2 g 200 mL/hr over 30 Minutes Intravenous On call 07/25/19 0852 07/26/19 0900     07/13/19 0600  Ampicillin-Sulbactam (UNASYN) 3 g in sodium chloride 0.9 % 100 mL IVPB  Status:  Discontinued    Note to Pharmacy: Unasyn 3 g IV q24h for CrCL < 15 mL/min   3 g 200 mL/hr over 30 Minutes Intravenous Daily 07/13/19 0515 07/20/19 0910   07/11/19 1800  piperacillin-tazobactam (ZOSYN) IVPB 3.375 g  Status:  Discontinued     3.375 g 12.5 mL/hr over 240 Minutes Intravenous Every 12 hours 07/11/19 1601 07/13/19 0515   07/08/19 1400  cefTRIAXone (ROCEPHIN) 2 g in sodium chloride 0.9 % 100 mL IVPB  Status:  Discontinued     2 g 200 mL/hr over 30 Minutes Intravenous Every 24 hours 07/07/19 1644 07/11/19 1532   07/08/19 1400  azithromycin (ZITHROMAX) tablet 500 mg     500 mg Oral Daily 07/07/19 1644 07/11/19 1515   07/07/19 1700  cefTRIAXone (ROCEPHIN) 1 g in sodium chloride 0.9 % 100 mL IVPB     1 g 200 mL/hr over 30 Minutes Intravenous  Once 07/07/19 1656 07/07/19 1849   07/07/19 1200  cefTRIAXone (ROCEPHIN) 1 g in sodium chloride 0.9 % 100 mL IVPB     1 g 200 mL/hr over 30 Minutes Intravenous  Once 07/07/19 1155 07/07/19 1328   07/07/19 1200  azithromycin (ZITHROMAX) 500 mg in sodium chloride 0.9 % 250 mL IVPB     500 mg 250 mL/hr over 60 Minutes Intravenous  Once 07/07/19 1155 07/07/19 1507     Subjective  The patient is sitting up at bedside following HD. No new complaints.  Objective   Vitals:  Vitals:   07/25/19 1330 07/25/19  1400  BP: (!) 119/54 125/88  Pulse: 69 79  Resp:    Temp:    SpO2:  100%   Exam:  Constitutional:  . The patient is awake, alert, and oriented x 3. No acute distress. Respiratory:  . No increased work of breathing. . No wheezes, rales, or rhonchi . No tactile fremitus Cardiovascular:  . Regular rate and rhythm . No murmurs, ectopy, or gallups. . No lateral PMI. No thrills. Abdomen:  . Abdomen is soft, non-tender, non-distended . No hernias, masses, or organomegaly . Normoactive bowel sounds.  Musculoskeletal:  . No cyanosis,  clubbing, or edema Skin:  . No rashes, lesions, ulcers . palpation of skin: no induration or nodules Neurologic:  . CN 2-12 intact . Sensation all 4 extremities intact Psychiatric:  . Mental status o Mood, affect appropriate o Orientation to person, place, time  . judgment and insight appear intact  I have personally reviewed the following:   Today's Data  . Vitals, BMP, CBC   Micro Data  . Blood cultures x 2 (07/20/2019) no growth  Imaging  . CXR 12/17, 20, 22  Scheduled Meds: . aspirin EC  81 mg Oral QHS  . atorvastatin  20 mg Oral Daily  . calcium acetate  1,334 mg Oral TID WC  . carvedilol  25 mg Oral BID WC  . chlorhexidine      . Chlorhexidine Gluconate Cloth  6 each Topical Q0600  . cloNIDine  0.1 mg Oral BID  . darbepoetin (ARANESP) injection - DIALYSIS  150 mcg Intravenous Q Sat-HD  . finasteride  5 mg Oral QPM  . heparin      . hydrALAZINE  100 mg Oral TID  . insulin aspart  0-9 Units Subcutaneous TID WC  . insulin glargine  6 Units Subcutaneous Daily  . isosorbide dinitrate  30 mg Oral TID  . lidocaine      . midazolam      . multivitamin  1 tablet Oral QHS  . ranolazine  1,000 mg Oral BID  . tamsulosin  0.4 mg Oral QPC supper  . warfarin  7.5 mg Oral ONCE-1800  . Warfarin - Pharmacist Dosing Inpatient   Does not apply q1800   Continuous Infusions: .  ceFAZolin (ANCEF) IV      Principal Problem:   Sepsis ([place]) Active Problems:   CLL (chronic lymphocytic leukemia) (HCC)   Type 2 diabetes, HbA1c goal < 7% (HCC)   Acute renal failure superimposed on chronic kidney disease (HCC)   Acute on chronic combined systolic and diastolic CHF (congestive heart failure) (HCC)   Hypertensive urgency   Urinary tract infection   Abnormal EKG   Elevated troponin   Pressure injury of skin   LOS: 18 days   A & P  New ESRD with worsening CKD 5 on HD MWF: Awaiting tunneled catheter placement for dc. Nephrology consulted. Patient is set up for [facility] on  Monday Wednesday Friday schedule second shift at discharge. Pt had tunneled catheter placed today. He will have HD with it today. D/C to home tomorrow.   Leukocytosis: Due to E. Faecalis CAP. Treatment completed. Down to 14.1 today. Procalcitonin also trending down. IR to be consulted once elevated WBC has resolved. Not due to CLL. WBC has been normal with CLL per H/O.  Sepsis secondary to Enterococcous Faecalis community-acquired pneumonia, POA and Concurrent E. coli UTI, POA: Urine culture grew greater than 100,000 colonies of E. coli, pansensitive. Sputum culture grew abundant enterococous faecalis,  pansensitive. The patient was started on azithromycin and ceftriaxone and completed 6 days of this therapy. On 07/12/2019 she was converted to Unasyn. Infectious disease was consulted and antibiotics were dc'd on 07/20/2019.   Enterococcus faecalis community-acquired pneumonia: Resolved. Completed course of antibiotics Afebrile. WBC remains elevated, but is declining as is procalcitonin. O2 saturation 99% on room air  Anemia of chronic disease in the setting of ESRD complicated by iron deficiency: Received 3 unit irradiated PRBCs.  Nephrology managing with ESA and iron repletion. No sign of overt bleeding. Continue ESA and iron replacement as per nephrology.  Improving left knee effusion, recurrent: Reports left knee effusion, stating it has happened multiple times in the past and last arthrocentesis was many years ago.  Declined further evaluation.  Effusion resolving.  Nontender on palpation.  No erythema.  Hypervolemic hyponatremia: Volume managed by nephrology with hemodialysis. The p atient underwent HD today.  NSTEMI: Followed by cardiology.On aspirin and Lipitor. Continue cardiac medications as recommended by cardiology.  Paroxysmal A. Fib: CHA2DS2-VASc score 6. Anticoagulate with Coumadin per cardiology Continue warfarin, managed by pharmacy. INR is therapeutic 2.3.  BPH: Stable.  Still makes urine. Continue home medications: Tamsulosin and finasteride  Acute on chronic combined systolic and diastolic CHF: Continue strict I's and O's and daily weight Continue cardiac medications. Volume managed by hemodialysis and IV Lasix as needed for volume overload. Net I&O -14.24 L.  Essential hypertension: Blood pressure is well controlled on clonidine 0.1 mg bid, coreg 25 mg bid, hydralazine 100 mg tid, and isosorbid dinitrate 30 mg tid. Monitor.  CLL: Follow-up outpatient with oncology Dr. Earlie Server. Curbsided with Dr. Earlie Server.  Patient has a normal WBC with his CLL.  Last WBC 8K in August 2020 per oncology.  Physical debility, ambulatory dysfunction: PT assessment recommended SNF.  Patient desires to DC to home with family's assistance.  DME: Gilford Rile, hospital bed, wheelchair.Continue PT OT with assistance and fall precautions.  I have seen and examined this patient myself. I have spent 32 minutes in his evaluation and care.   DVT prophylaxis:Coumadin Code Status:DNR  Family Communication:None available. Disposition Plan:Plan discharge to home with home health services PT OT RN after [facility] placement and when patient is hemodynamically stable. Myliah Medel, DO [facility] Direct contact: see [email or web address]  7PM-7AM contact night coverage as above 07/25/2019, 2:36 PM  LOS: 16 days

## 2019-07-25 NOTE — Progress Notes (Signed)
Pt received from HD. VSS. Pt A/Ox4 and drowsy. Will continue to monitor.  Clyde Canterbury, RN

## 2019-07-25 NOTE — Procedures (Signed)
Pre-procedure Diagnosis: ESRD Post-procedure Diagnosis: Same  Successful conversion of existing non-tunneled HD catheter to a tunneled HD catheter with tips terminating within the superior aspect of the right atrium.    Complications: None Immediate EBL: Minimal   The catheter is ready for immediate use.   Ronny Bacon, MD Pager #: [phone]

## 2019-07-25 NOTE — H&P (Signed)
Chief Complaint: Patient was seen in consultation today for temporary to tunneled dialysis catheter Chief Complaint  Patient presents with  . Shortness of Breath   at the request of Dr Laurena Bering  Supervising Physician: Sandi Mariscal  Patient Status: [facility] - In-pt  History of Present Illness: Ray Merritt is a 84 y.o. male   HTN; CAD; CLL- Dr Inda Merlin IDDM-- worsening SOB and CP 07/07/19 NSTEMI Worsening enal function First dialysis 12/29-- unable to cannulate soon after Generalized weakness  CKD5 A/CKD Left AVF-- infiltrated Rt temp cath placed 12/24 in IR Request for transition to tunneled catheter for ongoing hemodialysis  BC neg afeb Wbc 15  Past Medical History:  Diagnosis Date  . Anemia   . Arthritis   . Cancer ([place]) 2015-present   CLL  . Chronic kidney disease   . Coronary artery disease 04/2016   1 stent  . Diabetes mellitus    insulin dependent   . Enlarged prostate   . Headache   . Hypertension   . Pneumonia   . Sepsis ([place]) 06/2019    Past Surgical History:  Procedure Laterality Date  . AV FISTULA PLACEMENT Left 04/28/2017   Procedure: Left arm ARTERIOVENOUS (AV) FISTULA CREATION;  Surgeon: Angelia Mould, MD;  Location: [place];  Service: Vascular;  Laterality: Left;  . CARDIAC CATHETERIZATION N/A 05/01/2015   Procedure: Left Heart Cath and Coronary Angiography;  Surgeon: Adrian Prows, MD;  Location: [place] [facility];  Service: Cardiovascular;  Laterality: N/A;  . CARDIAC CATHETERIZATION  05/01/2015   Procedure: Intravascular Pressure Wire/FFR Study;  Surgeon: Adrian Prows, MD;  Location: [place] [facility];  Service: Cardiovascular;;  . CARDIAC CATHETERIZATION N/A 05/22/2015   Procedure: Left Heart Cath and Coronary Angiography;  Surgeon: Adrian Prows, MD;  Location: [place] [facility];  Service: Cardiovascular;  Laterality: N/A;  . CARDIAC CATHETERIZATION N/A 05/22/2015   Procedure: Coronary Stent Intervention;  Surgeon: Adrian Prows, MD;   Location: [place] [facility];  Service: Cardiovascular;  Laterality: N/A;  . COLONOSCOPY W/ BIOPSIES AND POLYPECTOMY    . IR FLUORO GUIDE CV LINE RIGHT  07/14/2019  . IR US GUIDE VASC ACCESS RIGHT  07/14/2019  . SHOULDER SURGERY    . TONSILLECTOMY      Allergies: Patient has no known allergies.  Medications: Prior to Admission medications   Medication Sig Start Date End Date Taking? Authorizing Provider  ALPHAGAN P 0.1 % SOLN Place 2 drops 2 (two) times daily into both eyes.    Yes [provider]  Ascorbic Acid (VITAMIN C) 1000 MG tablet Take 1,000 mg by mouth daily.   Yes [provider]  aspirin EC 81 MG tablet Take 81 mg by mouth at bedtime.    Yes [provider]  carvedilol (COREG) 25 MG tablet Take 25 mg by mouth 2 (two) times daily.    Yes [provider]  cholecalciferol (VITAMIN D) 1000 units tablet Take 1,000 Units by mouth daily.    Yes [provider]  CONTOUR NEXT TEST test strip USE AS DIRECTED TO CHECK BLOOD SUGAR TWICE A DAY Patient taking differently: 1 each by Other route 2 (two) times daily.  06/01/19  Yes Elayne Snare, MD  diphenhydramine-acetaminophen (TYLENOL PM) 25-500 MG TABS tablet Take 2 tablets by mouth at bedtime.   Yes [provider]  ferrous sulfate 325 (65 FE) MG tablet Take 325 mg by mouth 2 (two) times daily.    Yes [provider]  finasteride (PROSCAR) 5 MG tablet Take 5 mg by mouth every evening.    Yes [provider]  furosemide (LASIX) 80 MG tablet Take 40 mg by mouth daily.    Yes [provider]  hydrALAZINE (APRESOLINE) 100 MG tablet Take 100 mg by mouth 3 (three) times daily.    Yes [provider]  insulin NPH Human (HUMULIN N,NOVOLIN N) 100 UNIT/ML injection Inject 22 Units into the skin at bedtime. Inject 22 units under the skin every night.   Yes [provider]  isosorbide dinitrate (ISORDIL) 30 MG tablet Take 1 tablet (30 mg total) by mouth 3  (three) times daily. 06/09/19  Yes Adrian Prows, MD  Multiple Vitamin (MULTIVITAMIN WITH MINERALS) TABS tablet Take 1 tablet by mouth daily.   Yes [provider]  nitroGLYCERIN (NITROSTAT) 0.4 MG SL tablet Place 1 tablet (0.4 mg total) under the tongue every 5 (five) minutes as needed for chest pain. 03/18/19 07/07/19 Yes Miquel Dunn, NP  Omega-3 Fatty Acids (FISH OIL) 1000 MG CAPS Take 1,000 mg by mouth 2 (two) times daily.   Yes [provider]  ranolazine (RANEXA) 500 MG 12 hr tablet Take 2 tablets (1,000 mg total) by mouth 2 (two) times daily. 05/11/19  Yes Miquel Dunn, NP  tamsulosin (FLOMAX) 0.4 MG CAPS capsule Take 0.4 mg by mouth daily after supper.    Yes [provider]  Travoprost, BAK Free, (TRAVATAN) 0.004 % SOLN ophthalmic solution Place 1 drop into both eyes at bedtime.   Yes [provider]  triamcinolone cream (KENALOG) 0.1 % Apply 1 application topically 2 (two) times daily as needed (for itching/irritation).    Yes [provider]  amLODipine (NORVASC) 5 MG tablet TAKE 1 TABLET BY MOUTH EVERY DAY 07/21/19   Miquel Dunn, NP  atorvastatin (LIPITOR) 20 MG tablet TAKE 1 TABLET BY MOUTH EVERY DAY 07/12/19   Miquel Dunn, NP  cloNIDine (CATAPRES) 0.2 MG tablet TAKE 1 TABLET BY MOUTH 2 TIMES DAILY 07/19/19   Adrian Prows, MD  NOVOLIN R 100 UNIT/ML injection INJECT 5 UNITS IN [facility] AND 7 UNITS AT [facility] 07/18/19   Elayne Snare, MD     Family History  Problem Relation Age of Onset  . Hypertension Mother   . Heart disease Sister   . Stroke Sister   . Diabetes Brother     Social History   Socioeconomic History  . Marital status: Married    Spouse name: Not on file  . Number of children: 3  . Years of education: Not on file  . Highest education level: Not on file  Occupational History  . Occupation: retired  Tobacco Use  . Smoking status: Former Smoker    Packs/day: 2.00    Types: Cigarettes  .  Smokeless tobacco: Never Used  . Tobacco comment: quit smoking in early 70's, started ate age 80  Substance and Sexual Activity  . Alcohol use: No  . Drug use: No  . Sexual activity: Not on file  Other Topics Concern  . Not on file  Social History Narrative  . Not on file   Social Determinants of Health   Financial Resource Strain:   . Difficulty of Paying Living Expenses: Not on file  Food Insecurity:   . Worried About Charity fundraiser in the Last Year: Not on file  . Ran Out of Food in the Last Year: Not on file  Transportation Needs:   . Lack of Transportation (  Medical): Not on file  . Lack of Transportation (Non-Medical): Not on file  Physical Activity:   . Days of Exercise per Week: Not on file  . Minutes of Exercise per Session: Not on file  Stress:   . Feeling of Stress : Not on file  Social Connections:   . Frequency of Communication with Friends and Family: Not on file  . Frequency of Social Gatherings with Friends and Family: Not on file  . Attends Religious Services: Not on file  . Active Member of Clubs or Organizations: Not on file  . Attends Archivist Meetings: Not on file  . Marital Status: Not on file    Review of Systems: A 12 point ROS discussed and pertinent positives are indicated in the HPI above.  All other systems are negative.  Review of Systems  Constitutional: Positive for activity change, appetite change and fatigue. Negative for fever.  Respiratory: Positive for shortness of breath. Negative for wheezing.   Cardiovascular: Negative for chest pain.  Neurological: Positive for weakness.  Psychiatric/Behavioral: Positive for decreased concentration. Negative for behavioral problems.    Vital Signs: BP (!) 151/48 (BP Location: Right Arm)   Pulse 74   Temp 98.9 F (37.2 C) (Axillary)   Resp 13   Ht 6\' 3"  (1.905 m)   Wt 209 lb 10.5 oz (95.1 kg)   SpO2 100%   BMI 26.21 kg/m   Physical Exam Vitals reviewed.  Cardiovascular:       Rate and Rhythm: Normal rate and regular rhythm.  Pulmonary:     Effort: Pulmonary effort is normal.  Abdominal:     Palpations: Abdomen is soft.  Musculoskeletal:        General: Normal range of motion.  Skin:    General: Skin is warm and dry.  Neurological:     General: No focal deficit present.     Mental Status: He is alert.  Psychiatric:        Behavior: Behavior normal.     Comments: Pt is able to answer questions Able to tell me dob and location Weak and unable to sign consent well Spoke to [facility]-- signed consent via phone      Imaging: IR Fluoro Guide CV Line Right  Result Date: 07/14/2019 CLINICAL DATA:  Renal failure, needs access for hemodialysis. On Coumadin. EXAM: EXAM RIGHT IJ CATHETER PLACEMENT UNDER ULTRASOUND AND FLUOROSCOPIC GUIDANCE TECHNIQUE: The procedure, risks (including but not limited to bleeding, infection, organ damage, pneumothorax), benefits, and alternatives were explained to the family. Questions regarding the procedure were encouraged and answered. The family understands and consents to the procedure. Patency of the right IJ vein was confirmed with ultrasound with image documentation. An appropriate skin site was determined. Skin site was marked. Region was prepped using maximum barrier technique including cap and mask, sterile gown, sterile gloves, large sterile sheet, and Chlorhexidine as cutaneous antisepsis. The region was infiltrated locally with 1% lidocaine. Under real-time ultrasound guidance, the right IJ vein was accessed with a 21 gauge needle; the needle tip within the vein was confirmed with ultrasound image documentation. The needle exchanged over a 018 guidewire for vascular dilator which allowed advancement of a 20 cm Mahurkar catheter. This was positioned with the tip at the cavoatrial junction. Spot chest radiograph shows good positioning and no pneumothorax. Catheter was flushed and sutured externally with 0-Prolene sutures.  Patient tolerated the procedure well. FLUOROSCOPY TIME:  0.2 minute; 23 uGym2 DAP COMPLICATIONS: COMPLICATIONS none IMPRESSION: 1. Technically successful  right IJ Mahurkar catheter placement. Electronically Signed   By: Lucrezia Europe M.D.   On: 07/14/2019 13:09   IR US Guide Vasc Access Right  Result Date: 07/14/2019 CLINICAL DATA:  Renal failure, needs access for hemodialysis. On Coumadin. EXAM: EXAM RIGHT IJ CATHETER PLACEMENT UNDER ULTRASOUND AND FLUOROSCOPIC GUIDANCE TECHNIQUE: The procedure, risks (including but not limited to bleeding, infection, organ damage, pneumothorax), benefits, and alternatives were explained to the family. Questions regarding the procedure were encouraged and answered. The family understands and consents to the procedure. Patency of the right IJ vein was confirmed with ultrasound with image documentation. An appropriate skin site was determined. Skin site was marked. Region was prepped using maximum barrier technique including cap and mask, sterile gown, sterile gloves, large sterile sheet, and Chlorhexidine as cutaneous antisepsis. The region was infiltrated locally with 1% lidocaine. Under real-time ultrasound guidance, the right IJ vein was accessed with a 21 gauge needle; the needle tip within the vein was confirmed with ultrasound image documentation. The needle exchanged over a 018 guidewire for vascular dilator which allowed advancement of a 20 cm Mahurkar catheter. This was positioned with the tip at the cavoatrial junction. Spot chest radiograph shows good positioning and no pneumothorax. Catheter was flushed and sutured externally with 0-Prolene sutures. Patient tolerated the procedure well. FLUOROSCOPY TIME:  0.2 minute; 23 uGym2 DAP COMPLICATIONS: COMPLICATIONS none IMPRESSION: 1. Technically successful right IJ Mahurkar catheter placement. Electronically Signed   By: Lucrezia Europe M.D.   On: 07/14/2019 13:09   DG CHEST PORT 1 VIEW  Result Date: 07/12/2019 CLINICAL  DATA:  Hypoxia and shortness of breath. EXAM: PORTABLE CHEST 1 VIEW COMPARISON:  07/10/2019 and earlier studies. FINDINGS: Masslike right perihilar opacity has developed since the prior chest radiograph. Lungs also demonstrate prominent bronchovascular markings and mild interstitial thickening, unchanged from the prior study. No convincing pleural effusion.  No pneumothorax. Cardiac silhouette is normal in size. No mediastinal or left hilar masses. IMPRESSION: 1. Masslike right perihilar opacity developing since the prior exam. This rapid change is consistent with infection or inflammation. Suspect pneumonia. Electronically Signed   By: Lajean Manes M.D.   On: 07/12/2019 09:10   DG CHEST PORT 1 VIEW  Result Date: 07/10/2019 CLINICAL DATA:  Hypoxia. Shortness of breath with productive cough for 1 week. Recent hospital admission for sepsis. EXAM: PORTABLE CHEST 1 VIEW COMPARISON:  Radiographs 07/07/2019 and 06/07/2017. CT 04/02/2016. FINDINGS: 1645 hours. Stable mild cardiomegaly and aortic atherosclerosis. There is persistent interstitial prominence suspicious for mild edema. Compared with the recent prior study, the aeration of the retrocardiac left lower lobe has improved. There is no new airspace disease, pneumothorax or significant pleural effusion. The bones appear unremarkable. Telemetry leads overlie the chest. IMPRESSION: 1. Persistent interstitial prominence suspicious for mild edema. 2. Interval improved aeration of the left lower lobe without residual focal airspace disease. 3. Stable mild cardiomegaly and aortic atherosclerosis. Electronically Signed   By: Richardean Sale M.D.   On: 07/10/2019 17:02   DG Chest Port 1 View  Result Date: 07/07/2019 CLINICAL DATA:  Shortness of breath, cough EXAM: PORTABLE CHEST 1 VIEW COMPARISON:  06/07/2017 FINDINGS: Heart size is mildly enlarged. Calcific aortic knob. Mildly prominent bibasilar interstitial markings. No pleural effusion or pneumothorax.  IMPRESSION: Mild cardiomegaly with mild bibasilar interstitial prominence, could represent atelectasis, mild interstitial edema or atypical infection. Electronically Signed   By: Davina Poke M.D.   On: 07/07/2019 11:39   VAS [place] LOWER EXTREMITY VENOUS (DVT)  Result Date: 07/09/2019  Lower Venous Study Indications: Swelling.  Comparison Study: no prior Performing Technologist: June Leap RDMS, RVT  Examination Guidelines: A complete evaluation includes B-mode imaging, spectral Doppler, color Doppler, and power Doppler as needed of all accessible portions of each vessel. Bilateral testing is considered an integral part of a complete examination. Limited examinations for reoccurring indications may be performed as noted.  +---------+---------------+---------+-----------+----------+--------------+ RIGHT    CompressibilityPhasicitySpontaneityPropertiesThrombus Aging +---------+---------------+---------+-----------+----------+--------------+ CFV      Full           Yes      Yes                                 +---------+---------------+---------+-----------+----------+--------------+ SFJ      Full                                                        +---------+---------------+---------+-----------+----------+--------------+ FV Prox  Full                                                        +---------+---------------+---------+-----------+----------+--------------+ FV Mid   Full                                                        +---------+---------------+---------+-----------+----------+--------------+ FV DistalFull                                                        +---------+---------------+---------+-----------+----------+--------------+ PFV      Full                                                        +---------+---------------+---------+-----------+----------+--------------+ POP      Full           Yes      Yes                                  +---------+---------------+---------+-----------+----------+--------------+ PTV      Full                                                        +---------+---------------+---------+-----------+----------+--------------+ PERO     Full                                                        +---------+---------------+---------+-----------+----------+--------------+   +---------+---------------+---------+-----------+----------+--------------+  LEFT     CompressibilityPhasicitySpontaneityPropertiesThrombus Aging +---------+---------------+---------+-----------+----------+--------------+ CFV      Full           Yes      Yes                                 +---------+---------------+---------+-----------+----------+--------------+ SFJ      Full                                                        +---------+---------------+---------+-----------+----------+--------------+ FV Prox  Full                                                        +---------+---------------+---------+-----------+----------+--------------+ FV Mid   Full                                                        +---------+---------------+---------+-----------+----------+--------------+ FV DistalFull                                                        +---------+---------------+---------+-----------+----------+--------------+ PFV      Full                                                        +---------+---------------+---------+-----------+----------+--------------+ POP      Full           Yes      Yes                                 +---------+---------------+---------+-----------+----------+--------------+ PTV      Full                                                        +---------+---------------+---------+-----------+----------+--------------+ PERO     Full                                                         +---------+---------------+---------+-----------+----------+--------------+     Summary: Right: There is no evidence of deep vein thrombosis in the lower extremity. No cystic structure found in the popliteal fossa. Ultrasound characteristics of enlarged lymph nodes are noted in the groin. Left: There is no evidence of deep vein thrombosis in  the lower extremity. No cystic structure found in the popliteal fossa.  *See table(s) above for measurements and observations. Electronically signed by Harold Barban MD on 07/09/2019 at 4:45:22 PM.    Final     Labs:  CBC: Recent Labs    07/22/19 0324 07/23/19 0428 07/24/19 0351 07/25/19 0321  WBC 16.1* 14.0* 14.1* 15.4*  HGB 8.2* 8.0* 8.5* 8.0*  HCT 24.3* 25.5* 27.0* 25.3*  PLT 449* 484* 512* 498*    COAGS: Recent Labs    07/22/19 0324 07/23/19 0428 07/24/19 0351 07/25/19 0321  INR 2.3* 1.8* 1.4* 1.5*    BMP: Recent Labs    07/22/19 0324 07/23/19 0428 07/24/19 0351 07/25/19 0321  NA 130* 128* 131* 131*  K 4.0 4.2 4.0 4.2  CL 93* 91* 94* 94*  CO2 25 23 26 24   GLUCOSE 173* 171* 166* 99  BUN 73* 90* 54* 72*  CALCIUM 8.7* 8.7* 8.7* 9.1  CREATININE 5.84* 6.33* 4.67* 5.82*  GFRNONAA 8* 8* 11* 8*  GFRAA 10* 9* 13* 10*    LIVER FUNCTION TESTS: Recent Labs    03/10/19 1032 07/07/19 1057 07/09/19 0318 07/10/19 0301 07/11/19 0319 07/16/19 0534  BILITOT 0.3 0.6  --   --   --   --   AST 13* 13*  --   --   --   --   ALT 15 14  --   --   --   --   ALKPHOS 64 61  --   --   --   --   PROT 6.0* 6.4*  --   --   --   --   ALBUMIN 3.4* 3.5 2.7* 2.6* 2.5* 2.0*    TUMOR MARKERS: No results for input(s): AFPTM, CEA, CA199, CHROMGRNA in the last 8760 hours.  Assessment and Plan:  CKD-- ESRD Fistula placed 2018--- used 2-3 x (first use 07/09/19) Fistula infiltration Temporary dialysis catheter placed in IR 12/24 Now for conversion to tunneled catheter per Nephrology Ongoing use Risks and benefits discussed with the patient and  daughter Melonie via phone including, but not limited to bleeding, infection, vascular injury, pneumothorax which may require chest tube placement, air embolism or even death  All of her  questions were answered, she is agreeable to proceed. Consent signed and in chart.   Thank you for this interesting consult.  I greatly enjoyed meeting MYKAL BATIZ and look forward to participating in their care.  A copy of this report was sent to the requesting provider on this date.  Electronically Signed: Lavonia Drafts, PA-C 07/25/2019, 8:39 AM   I spent a total of 20 Minutes    in face to face in clinical consultation, greater than 50% of which was counseling/coordinating care for tunneled dialysis catheter placement

## 2019-07-25 NOTE — Progress Notes (Signed)
  [facility] Progress Note    Assessment/ Plan:   1. New ESRD was CKD5; CLIP is [facility] MWF 2nd shift. HD 07/23/2019, next planned for today with healthy UF goal.   2. L RC AVF, infiltrated, with TEMP HD cath at current time not able to cannulate 12/28 or 12/30. pursuing TDC--> done today.    3. CAP, s/p antibiotics  4. HTN, stable--> expect to improve with UF  5. Anemia: Aranesp q Saturday; Hb in the 8s and stable.  6. CKD BMD: PhosLo, follow with HD.  PTH 144, stable  7. Dispo: Ok to d/c from renal perspective now that [facility] in place.  L FA AVF will need to be reevaluated outpt.  Subjective:    No complaints on interview.  Says 'woozy' after [facility] placement ? Conscious sedation effect.  Denies any complaints.    Objective:   BP (!) 147/47 (BP Location: Right Arm)   Pulse 67   Temp 99.1 F (37.3 C) (Oral)   Resp 14   Ht 6\' 3"  (1.905 m)   Wt 95.1 kg   SpO2 100%   BMI 26.21 kg/m   Intake/Output Summary (Last 24 hours) at 07/25/2019 1028 Last data filed at 07/24/2019 2300 Gross per 24 hour  Intake 120 ml  Output --  Net 120 ml   Weight change: -1.6 kg  Physical Exam: Gen: NAD CVS: RRR Resp: clear anteriorly Abd: nontender Ext: 2+ LE edema ACCESS: R IJ nontunneled HD cath, LUE AVF dressed  Imaging: No results found.  Labs: BMET Recent Labs  Lab 07/19/19 0303 07/20/19 0336 07/21/19 0354 07/22/19 0324 07/23/19 0428 07/24/19 0351 07/25/19 0321  NA 133* 130* 132* 130* 128* 131* 131*  K 4.0 3.8 3.7 4.0 4.2 4.0 4.2  CL 94* 93* 94* 93* 91* 94* 94*  CO2 25 24 25 25 23 26 24   GLUCOSE 251* 218* 219* 173* 171* 166* 99  BUN 52* 76* 48* 73* 90* 54* 72*  CREATININE 4.48* 5.68* 4.88* 5.84* 6.33* 4.67* 5.82*  CALCIUM 8.6* 8.6* 8.3* 8.7* 8.7* 8.7* 9.1   CBC Recent Labs  Lab 07/22/19 0324 07/23/19 0428 07/24/19 0351 07/25/19 0321  WBC 16.1* 14.0* 14.1* 15.4*  NEUTROABS 11.4* 9.6* 9.6* 11.0*  HGB 8.2* 8.0* 8.5* 8.0*  HCT 24.3* 25.5* 27.0* 25.3*  MCV  93.5 97.0 97.1 97.3  PLT 449* 484* 512* 498*    Medications:    . aspirin EC  81 mg Oral QHS  . atorvastatin  20 mg Oral Daily  . calcium acetate  1,334 mg Oral TID WC  . carvedilol  25 mg Oral BID WC  . chlorhexidine      . Chlorhexidine Gluconate Cloth  6 each Topical Q0600  . cloNIDine  0.1 mg Oral BID  . darbepoetin (ARANESP) injection - DIALYSIS  150 mcg Intravenous Q Sat-HD  . finasteride  5 mg Oral QPM  . hydrALAZINE  100 mg Oral TID  . insulin aspart  0-9 Units Subcutaneous TID WC  . insulin glargine  6 Units Subcutaneous Daily  . isosorbide dinitrate  30 mg Oral TID  . lidocaine      . midazolam      . multivitamin  1 tablet Oral QHS  . ranolazine  1,000 mg Oral BID  . tamsulosin  0.4 mg Oral QPC supper  . warfarin  7.5 mg Oral ONCE-1800  . Warfarin - Pharmacist Dosing Inpatient   Does not apply [phone]

## 2019-07-26 LAB — CBC WITH DIFFERENTIAL/PLATELET
Abs Immature Granulocytes: 0.1 10*3/uL — ABNORMAL HIGH (ref 0.00–0.07)
Basophils Absolute: 0.1 10*3/uL (ref 0.0–0.1)
Basophils Relative: 0 %
Eosinophils Absolute: 0.1 10*3/uL (ref 0.0–0.5)
Eosinophils Relative: 1 %
HCT: 27.5 % — ABNORMAL LOW (ref 39.0–52.0)
Hemoglobin: 8.7 g/dL — ABNORMAL LOW (ref 13.0–17.0)
Immature Granulocytes: 1 %
Lymphocytes Relative: 13 %
Lymphs Abs: 1.9 10*3/uL (ref 0.7–4.0)
MCH: 30.2 pg (ref 26.0–34.0)
MCHC: 31.6 g/dL (ref 30.0–36.0)
MCV: 95.5 fL (ref 80.0–100.0)
Monocytes Absolute: 1.9 10*3/uL — ABNORMAL HIGH (ref 0.1–1.0)
Monocytes Relative: 12 %
Neutro Abs: 11.1 10*3/uL — ABNORMAL HIGH (ref 1.7–7.7)
Neutrophils Relative %: 73 %
Platelets: 549 10*3/uL — ABNORMAL HIGH (ref 150–400)
RBC: 2.88 MIL/uL — ABNORMAL LOW (ref 4.22–5.81)
RDW: 14.8 % (ref 11.5–15.5)
WBC: 15.1 10*3/uL — ABNORMAL HIGH (ref 4.0–10.5)
nRBC: 0 % (ref 0.0–0.2)

## 2019-07-26 LAB — BASIC METABOLIC PANEL WITH GFR
Anion gap: 14 (ref 5–15)
BUN: 47 mg/dL — ABNORMAL HIGH (ref 8–23)
CO2: 24 mmol/L (ref 22–32)
Calcium: 8.8 mg/dL — ABNORMAL LOW (ref 8.9–10.3)
Chloride: 97 mmol/L — ABNORMAL LOW (ref 98–111)
Creatinine, Ser: 4.61 mg/dL — ABNORMAL HIGH (ref 0.61–1.24)
GFR calc Af Amer: 13 mL/min — ABNORMAL LOW
GFR calc non Af Amer: 11 mL/min — ABNORMAL LOW
Glucose, Bld: 131 mg/dL — ABNORMAL HIGH (ref 70–99)
Potassium: 3.8 mmol/L (ref 3.5–5.1)
Sodium: 135 mmol/L (ref 135–145)

## 2019-07-26 LAB — GLUCOSE, CAPILLARY
Glucose-Capillary: 113 mg/dL — ABNORMAL HIGH (ref 70–99)
Glucose-Capillary: 124 mg/dL — ABNORMAL HIGH (ref 70–99)
Glucose-Capillary: 108 mg/dL — ABNORMAL HIGH (ref 70–99)
Glucose-Capillary: 103 mg/dL — ABNORMAL HIGH (ref 70–99)

## 2019-07-26 LAB — PROTIME-INR
INR: 1.6 — ABNORMAL HIGH (ref 0.8–1.2)
Prothrombin Time: 18.5 s — ABNORMAL HIGH (ref 11.4–15.2)

## 2019-07-26 MED ORDER — WARFARIN SODIUM 7.5 MG PO TABS
7.5000 mg | ORAL_TABLET | Freq: Once | ORAL | Status: AC
  Administered 2019-07-26: 15:00:00 7.5 mg via ORAL
  Filled 2019-07-26: qty 1

## 2019-07-26 MED ORDER — SODIUM CHLORIDE 0.9 % IV SOLN: 125.0000 mg | INTRAVENOUS | Status: AC

## 2019-07-26 MED ORDER — SALINE SPRAY 0.65 % NA SOLN: 2.0000 | NASAL | 0 refills | Status: AC | PRN

## 2019-07-26 MED ORDER — ATORVASTATIN CALCIUM 20 MG PO TABS: 20.0000 mg | ORAL_TABLET | Freq: Every day | ORAL | 0 refills | Status: AC

## 2019-07-26 MED ORDER — INSULIN GLARGINE 100 UNIT/ML ~~LOC~~ SOLN: 6.0000 [IU] | Freq: Every day | SUBCUTANEOUS | 11 refills | Status: AC

## 2019-07-26 MED ORDER — CALCIUM ACETATE (PHOS BINDER) 667 MG PO CAPS: 1334.0000 mg | ORAL_CAPSULE | Freq: Three times a day (TID) | ORAL | 0 refills | Status: AC

## 2019-07-26 MED ORDER — SODIUM CHLORIDE 0.9 % IV SOLN: 125.0000 mg | INTRAVENOUS | Status: DC

## 2019-07-26 MED ORDER — RENA-VITE PO TABS: 1.0000 | ORAL_TABLET | Freq: Every day | ORAL | 0 refills | Status: AC

## 2019-07-26 MED ORDER — WARFARIN SODIUM 5 MG PO TABS: 5.0000 mg | ORAL_TABLET | Freq: Every day | ORAL | 0 refills | Status: AC

## 2019-07-26 NOTE — Discharge Summary (Addendum)
Physician Discharge Summary  SRIJAN GIVAN GNO:[id number] DOB: [birth year] DOA: 07/07/2019  PCP: [name], MD  Admit date: 07/07/2019 Discharge date: 07/26/2019  Recommendations for Outpatient Follow-up:  Have INR drawn on 07/28/2018 and reported to PCP. Follow up with PCP in 7-10 days. Keep dialysis appointments. Follow up with hematology/oncology as directed Follow up with cardiology as directed. Home with [facility] PT/OT  Follow-up Information     [facility] Follow up.   Specialty: [place] Why: HHPT/OT arranged- they will call you to set up home visits        [facility] Follow up.   Why: wheelchair and hospital bed arranged- to be delivered to home (rolling walker arranged and to be delivered to room for daughter to take home) Contact information: [place] [address] [phone]             Discharge Diagnoses: Principal diagnosis is #1 ESRD, CKD 5, Now on HD E. Faecalis community acquired pneumonia Sepsis Anemia of chronic disease Left knee effusion Paroxysmal atrial fibrillation Hyponatremia NSTEMI Acute on chronic combined systolic and diastolic heart failure Hypertension CLL Debility Stage II pressure injury of coccyx (POA)  Discharge Condition: Fair  Disposition: SNF  Diet recommendation: Carbohydrate controlled  Filed Weights   07/25/19 1240 07/25/19 1547 07/26/19 0420  Weight: 97 kg 95.8 kg 92 kg    History of present illness:  Ray Merritt is a 84 y.o. male with medical history significant of hypertension, chronic kidney disease stage IV, CAD, carotid artery stenosis, DM type II, and CLL.  He presents with complaints of shortness of breath and cough with blood-tinged sputum this morning around 3 AM.  Reports having associated symptoms of chest pain running across his chest, nausea, and vomiting.  Denies any known recent sick contacts to his knowledge.  He does report  associated symptoms of weight gain and lower extremity leg swelling, but reports taking his furosemide 40 mg daily.  He still makes urine.  He has a fistula on his left arm that was placed, but has not been started on hemodialysis at this time.     ED Course: On admission into the emergency department patient was seen to be febrile up to 100.6 F, blood pressure elevated up to 190/66, respirations 14-28, and O2 saturations maintained on 2 L nasal cannula oxygen placed for comfort.  Labs significant for WBC 16.8, hemoglobin 8.5, BUN 98, creatinine 6.61, and trop 32.  EKG revealed ST depressions in the inferior and lateral leads.  Dr. Einar Gip of cardiology was consulted and agree with placing patient on a heparin drip for now.  Chest x-ray concerning for cardiomegaly with atypical pneumonia versus edema.  COVID-19 screening was negative x2 and influenza screen negative.  Patient also received empiric antibiotics of Rocephin and azithromycin.  Hospital Course:  84 year old gentleman with history of hypertension, CKD4 progressing to CKD 5, coronary artery disease with recent abnormal stress test, CLL with normal WBC count per oncology Dr. Earlie Server, insulin-dependent type 2 diabetes, who presented to the ED with complaints of gradually worsening shortness of breath and chest pain.  In the ED found to be febrile, elevated blood pressure, troponin S, and worsening creatinine.  EKG showing ST depression in inferior lateral leads.  Chest x-ray showing mild bibasilar interstitial prominence.  COVID-19 negative.  Cardiology and nephrology consulted started on hemodialysis, first HD on 07/09/2019.  Seen by cardiology and managed for NSTEMI.  Hospital course complicated by malfunction of  AV fistula and slow to improve leukocytosis not on steroids.  Discussed with oncology, not related to his CLL since neutrophil is elevated.  Discussed with ID Dr. Tommy Medal who recommended to stop IV abx on 12/30.  Generalized weakness, PT  assessment recommended SNF.  Patient declines and prefers to go home.  Tunneled dialysis catheter planned prior to discharge. IR would like improvement of leukocytosis prior to placement.  Repeated blood cultures peripherally which are negative to date.  Procalcitonin trending down.  Afebrile and no respiratory complaints.  Lactic acid negative.   Tunneled catheter placed today. HD here today. Will dc to home with home health PT/OT today.    Today's assessment: S: The patient is resting comfortably. He is excited to be going home. O: Vitals:  Vitals:   07/26/19 1105 07/26/19 1109  BP: (!) 148/34 (!) 160/48  Pulse: 62   Resp: 20   Temp: 99.1 F (37.3 C)   SpO2: 97%    Constitutional:  The patient is awake, alert, and oriented x 3. No acute distress. Respiratory:  No increased work of breathing. No wheezes, rales, or rhonchi No tactile fremitus Cardiovascular:  Regular rate and rhythm No murmurs, ectopy, or gallups. No lateral PMI. No thrills. Abdomen:  Abdomen is soft, non-tender, non-distended No hernias, masses, or organomegaly Normoactive bowel sounds.  Musculoskeletal:  No cyanosis, clubbing, or edema Skin:  No rashes, lesions, ulcers palpation of skin: no induration or nodules Neurologic:  CN 2-12 intact Sensation all 4 extremities intact Psychiatric:  Mental status Mood, affect appropriate Orientation to person, place, time  judgment and insight appear intact  Discharge Instructions  Discharge Instructions     Activity as tolerated - No restrictions   Complete by: As directed    Call MD for:  persistant nausea and vomiting   Complete by: As directed    Call MD for:  severe uncontrolled pain   Complete by: As directed    Diet Carb Modified   Complete by: As directed    Discharge instructions   Complete by: As directed    Have INR drawn on 07/28/2018 and reported to PCP. Keep dialysis appointments.  Follow up with PCP in 7-10 days.   Increase activity  slowly   Complete by: As directed       Allergies as of 07/26/2019   No Known Allergies      Medication List     STOP taking these medications    diphenhydramine-acetaminophen 25-500 MG Tabs tablet Commonly known as: TYLENOL PM   ferrous sulfate 325 (65 FE) MG tablet   Fish Oil 1000 MG Caps   furosemide 80 MG tablet Commonly known as: LASIX   insulin NPH Human 100 UNIT/ML injection Commonly known as: NOVOLIN N   triamcinolone cream 0.1 % Commonly known as: KENALOG       TAKE these medications    Alphagan P 0.1 % Soln Generic drug: brimonidine Place 2 drops 2 (two) times daily into both eyes.   amLODipine 5 MG tablet Commonly known as: NORVASC TAKE 1 TABLET BY MOUTH EVERY DAY   aspirin EC 81 MG tablet Take 81 mg by mouth at bedtime.   atorvastatin 20 MG tablet Commonly known as: LIPITOR TAKE 1 TABLET BY MOUTH EVERY DAY What changed: Another medication with the same name was added. Make sure you understand how and when to take each.   atorvastatin 20 MG tablet Commonly known as: LIPITOR Take 1 tablet (20 mg total) by mouth daily. What  changed: You were already taking a medication with the same name, and this prescription was added. Make sure you understand how and when to take each.   calcium acetate 667 MG capsule Commonly known as: PHOSLO Take 2 capsules (1,334 mg total) by mouth 3 (three) times daily with meals.   carvedilol 25 MG tablet Commonly known as: COREG Take 25 mg by mouth 2 (two) times daily.   cholecalciferol 1000 units tablet Commonly known as: VITAMIN D Take 1,000 Units by mouth daily.   cloNIDine 0.2 MG tablet Commonly known as: CATAPRES TAKE 1 TABLET BY MOUTH 2 TIMES DAILY   Contour Next Test test strip Generic drug: glucose blood USE AS DIRECTED TO CHECK BLOOD SUGAR TWICE A DAY What changed: See the new instructions.   ferric gluconate 125 mg in sodium chloride 0.9 % 100 mL Inject 125 mg into the vein every Monday,  Wednesday, and Friday with hemodialysis. Start taking on: July 27, 2019   finasteride 5 MG tablet Commonly known as: PROSCAR Take 5 mg by mouth every evening.   hydrALAZINE 100 MG tablet Commonly known as: APRESOLINE Take 100 mg by mouth 3 (three) times daily.   insulin glargine 100 UNIT/ML injection Commonly known as: LANTUS Inject 0.06 mLs (6 Units total) into the skin daily. Start taking on: July 27, 2019   isosorbide dinitrate 30 MG tablet Commonly known as: ISORDIL Take 1 tablet (30 mg total) by mouth 3 (three) times daily.   multivitamin Tabs tablet Take 1 tablet by mouth at bedtime.   multivitamin with minerals Tabs tablet Take 1 tablet by mouth daily.   nitroGLYCERIN 0.4 MG SL tablet Commonly known as: NITROSTAT Place 1 tablet (0.4 mg total) under the tongue every 5 (five) minutes as needed for chest pain.   NovoLIN R 100 units/mL injection Generic drug: insulin regular INJECT 5 UNITS IN MORNING AND 7 UNITS AT DINNER What changed: See the new instructions.   ranolazine 500 MG 12 hr tablet Commonly known as: RANEXA Take 2 tablets (1,000 mg total) by mouth 2 (two) times daily.   sodium chloride 0.65 % Soln nasal spray Commonly known as: OCEAN Place 2 sprays into both nostrils as needed for congestion.   tamsulosin 0.4 MG Caps capsule Commonly known as: FLOMAX Take 0.4 mg by mouth daily after supper.   Travoprost (BAK Free) 0.004 % Soln ophthalmic solution Commonly known as: TRAVATAN Place 1 drop into both eyes at bedtime.   vitamin C 1000 MG tablet Take 1,000 mg by mouth daily.   warfarin 5 MG tablet Commonly known as: COUMADIN Take 1 tablet (5 mg total) by mouth daily at 6 PM. Start taking on: July 27, 2019               Durable Medical Equipment  (From admission, onward)           Start     Ordered   07/18/19 1205  For home use only DME Hospital bed  Once    Question Answer Comment  Length of Need 6 Months   Patient has (list  medical condition): new ESRD, CHF   The above medical condition requires: Patient requires the ability to reposition frequently   Head must be elevated greater than: 30 degrees   Bed type Semi-electric   Hoyer Lift Yes   Support Surface: Gel Overlay      07/18/19 1205   07/17/19 0655  For home use only DME Walker rolling  Once    Comments:  5" wheels  Question:  Patient needs a walker to treat with the following condition  Answer:  Ambulatory dysfunction   07/17/19 0655   07/17/19 0654  For home use only DME standard manual wheelchair with seat cushion  Once    Comments: Patient suffers from ambulatory dysfunction which impairs their ability to perform daily activities like walking in the home.  A waling aid will not resolve issue with performing activities of daily living. A wheelchair will allow patient to safely perform daily activities. Patient can safely propel the wheelchair in the home or has a caregiver who can provide assistance. Length of need 6 months. Accessories: elevating leg rests, wheel locks, extensions and anti-tippers.   07/17/19 0655           No Known Allergies  The results of significant diagnostics from this hospitalization (including imaging, microbiology, ancillary and laboratory) are listed below for reference.    Significant Diagnostic Studies: IR Fluoro Guide CV Line Right  Result Date: 07/25/2019 INDICATION: History of end-stage renal disease, post placement of a temporary dialysis catheter on 07/14/2019. In need of durable intravenous access for the continuation dialysis. EXAM: FLUOROSCOPIC GUIDED CONVERSION OF NON TUNNELED TO TUNNELED CENTRAL VENOUS HEMODIALYSIS CATHETER COMPARISON:  Image guided placement of temporary hemodialysis catheter-07/14/2019 MEDICATIONS: None ANESTHESIA/SEDATION: Moderate (conscious) sedation was employed during this procedure. A total of Versed 0.5 mg was administered intravenously. Moderate Sedation Time: 10 minutes. The patient's  level of consciousness and vital signs were monitored continuously by radiology nursing throughout the procedure under my direct supervision. FLUOROSCOPY TIME:  30 seconds (4 mGy) COMPLICATIONS: None immediate. PROCEDURE: Informed written consent was obtained from the patient the patient's family after a discussion of the risks, benefits, and alternatives to treatment. Questions regarding the procedure were encouraged and answered. The skin and external portion of the existing hemodialysis catheter was prepped with chlorhexidine in a sterile fashion, and a sterile drape was applied covering the operative field. Maximum barrier sterile technique with sterile gowns and gloves were used for the procedure. A timeout was performed prior to the initiation of the procedure. A lumen of the none tunneled temporary dialysis catheter was cannulated with a stiff Glidewire in utilized for measurement purposes. Next, the Glidewire was advanced to the level of the IVC. A 19 cm tip to cuff palindrome dialysis catheter was tunneled from a site along the anterior chest to the venotomy site. Under intermittent fluoroscopic guidance, the temporary dialysis catheter was exchanged for a peel-away sheath. The tunneled dialysis catheter was inserted into the peel-away sheath with tips open terminating within in the superior aspect the right atrium. Final catheter positioning was confirmed and documented with a spot fluoroscopic image. The catheter aspirates and flushes normally. The catheter was flushed with appropriate volume heparin dwells. The catheter exit site was secured with a 0-Prolene retention sutures. The venotomy site was apposed with derma bond Steri-Strips. Both lumens were heparinized. A dressing was placed. The patient tolerated the procedure well without immediate post procedural complication. IMPRESSION: Successful fluoroscopic guided conversion of a temporary to a permanent 19 cm tip to cuff tunneled hemodialysis  catheter with tips terminating within the right atrium. The catheter is ready for immediate use. Electronically Signed   By: Sandi Mariscal M.D.   On: 07/25/2019 13:07   IR Fluoro Guide CV Line Right  Result Date: 07/14/2019 CLINICAL DATA:  Renal failure, needs access for hemodialysis. On Coumadin. EXAM: EXAM RIGHT IJ CATHETER PLACEMENT UNDER ULTRASOUND AND FLUOROSCOPIC GUIDANCE TECHNIQUE: The procedure,  risks (including but not limited to bleeding, infection, organ damage, pneumothorax), benefits, and alternatives were explained to the family. Questions regarding the procedure were encouraged and answered. The family understands and consents to the procedure. Patency of the right IJ vein was confirmed with ultrasound with image documentation. An appropriate skin site was determined. Skin site was marked. Region was prepped using maximum barrier technique including cap and mask, sterile gown, sterile gloves, large sterile sheet, and Chlorhexidine as cutaneous antisepsis. The region was infiltrated locally with 1% lidocaine. Under real-time ultrasound guidance, the right IJ vein was accessed with a 21 gauge needle; the needle tip within the vein was confirmed with ultrasound image documentation. The needle exchanged over a 018 guidewire for vascular dilator which allowed advancement of a 20 cm Mahurkar catheter. This was positioned with the tip at the cavoatrial junction. Spot chest radiograph shows good positioning and no pneumothorax. Catheter was flushed and sutured externally with 0-Prolene sutures. Patient tolerated the procedure well. FLUOROSCOPY TIME:  0.2 minute; 23 uGym2 DAP COMPLICATIONS: COMPLICATIONS none IMPRESSION: 1. Technically successful right IJ Mahurkar catheter placement. Electronically Signed   By: Lucrezia Europe M.D.   On: 07/14/2019 13:09   IR US Guide Vasc Access Right  Result Date: 07/14/2019 CLINICAL DATA:  Renal failure, needs access for hemodialysis. On Coumadin. EXAM: EXAM RIGHT IJ  CATHETER PLACEMENT UNDER ULTRASOUND AND FLUOROSCOPIC GUIDANCE TECHNIQUE: The procedure, risks (including but not limited to bleeding, infection, organ damage, pneumothorax), benefits, and alternatives were explained to the family. Questions regarding the procedure were encouraged and answered. The family understands and consents to the procedure. Patency of the right IJ vein was confirmed with ultrasound with image documentation. An appropriate skin site was determined. Skin site was marked. Region was prepped using maximum barrier technique including cap and mask, sterile gown, sterile gloves, large sterile sheet, and Chlorhexidine as cutaneous antisepsis. The region was infiltrated locally with 1% lidocaine. Under real-time ultrasound guidance, the right IJ vein was accessed with a 21 gauge needle; the needle tip within the vein was confirmed with ultrasound image documentation. The needle exchanged over a 018 guidewire for vascular dilator which allowed advancement of a 20 cm Mahurkar catheter. This was positioned with the tip at the cavoatrial junction. Spot chest radiograph shows good positioning and no pneumothorax. Catheter was flushed and sutured externally with 0-Prolene sutures. Patient tolerated the procedure well. FLUOROSCOPY TIME:  0.2 minute; 23 uGym2 DAP COMPLICATIONS: COMPLICATIONS none IMPRESSION: 1. Technically successful right IJ Mahurkar catheter placement. Electronically Signed   By: Lucrezia Europe M.D.   On: 07/14/2019 13:09   DG CHEST PORT 1 VIEW  Result Date: 07/12/2019 CLINICAL DATA:  Hypoxia and shortness of breath. EXAM: PORTABLE CHEST 1 VIEW COMPARISON:  07/10/2019 and earlier studies. FINDINGS: Masslike right perihilar opacity has developed since the prior chest radiograph. Lungs also demonstrate prominent bronchovascular markings and mild interstitial thickening, unchanged from the prior study. No convincing pleural effusion.  No pneumothorax. Cardiac silhouette is normal in size. No  mediastinal or left hilar masses. IMPRESSION: 1. Masslike right perihilar opacity developing since the prior exam. This rapid change is consistent with infection or inflammation. Suspect pneumonia. Electronically Signed   By: Lajean Manes M.D.   On: 07/12/2019 09:10   DG CHEST PORT 1 VIEW  Result Date: 07/10/2019 CLINICAL DATA:  Hypoxia. Shortness of breath with productive cough for 1 week. Recent hospital admission for sepsis. EXAM: PORTABLE CHEST 1 VIEW COMPARISON:  Radiographs 07/07/2019 and 06/07/2017. CT 04/02/2016. FINDINGS: 1645 hours.  Stable mild cardiomegaly and aortic atherosclerosis. There is persistent interstitial prominence suspicious for mild edema. Compared with the recent prior study, the aeration of the retrocardiac left lower lobe has improved. There is no new airspace disease, pneumothorax or significant pleural effusion. The bones appear unremarkable. Telemetry leads overlie the chest. IMPRESSION: 1. Persistent interstitial prominence suspicious for mild edema. 2. Interval improved aeration of the left lower lobe without residual focal airspace disease. 3. Stable mild cardiomegaly and aortic atherosclerosis. Electronically Signed   By: Richardean Sale M.D.   On: 07/10/2019 17:02   DG Chest Port 1 View  Result Date: 07/07/2019 CLINICAL DATA:  Shortness of breath, cough EXAM: PORTABLE CHEST 1 VIEW COMPARISON:  06/07/2017 FINDINGS: Heart size is mildly enlarged. Calcific aortic knob. Mildly prominent bibasilar interstitial markings. No pleural effusion or pneumothorax. IMPRESSION: Mild cardiomegaly with mild bibasilar interstitial prominence, could represent atelectasis, mild interstitial edema or atypical infection. Electronically Signed   By: Davina Poke M.D.   On: 07/07/2019 11:39   VAS [place] LOWER EXTREMITY VENOUS (DVT)  Result Date: 07/09/2019  Lower Venous Study Indications: Swelling.  Comparison Study: no prior Performing Technologist: June Leap RDMS, RVT  Examination  Guidelines: A complete evaluation includes B-mode imaging, spectral Doppler, color Doppler, and power Doppler as needed of all accessible portions of each vessel. Bilateral testing is considered an integral part of a complete examination. Limited examinations for reoccurring indications may be performed as noted.  +---------+---------------+---------+-----------+----------+--------------+  RIGHT     Compressibility Phasicity Spontaneity Properties Thrombus Aging  +---------+---------------+---------+-----------+----------+--------------+  CFV       Full            Yes       Yes                                    +---------+---------------+---------+-----------+----------+--------------+  SFJ       Full                                                             +---------+---------------+---------+-----------+----------+--------------+  FV Prox   Full                                                             +---------+---------------+---------+-----------+----------+--------------+  FV Mid    Full                                                             +---------+---------------+---------+-----------+----------+--------------+  FV Distal Full                                                             +---------+---------------+---------+-----------+----------+--------------+  PFV  Full                                                             +---------+---------------+---------+-----------+----------+--------------+  POP       Full            Yes       Yes                                    +---------+---------------+---------+-----------+----------+--------------+  PTV       Full                                                             +---------+---------------+---------+-----------+----------+--------------+  PERO      Full                                                             +---------+---------------+---------+-----------+----------+--------------+    +---------+---------------+---------+-----------+----------+--------------+  LEFT      Compressibility Phasicity Spontaneity Properties Thrombus Aging  +---------+---------------+---------+-----------+----------+--------------+  CFV       Full            Yes       Yes                                    +---------+---------------+---------+-----------+----------+--------------+  SFJ       Full                                                             +---------+---------------+---------+-----------+----------+--------------+  FV Prox   Full                                                             +---------+---------------+---------+-----------+----------+--------------+  FV Mid    Full                                                             +---------+---------------+---------+-----------+----------+--------------+  FV Distal Full                                                             +---------+---------------+---------+-----------+----------+--------------+  PFV       Full                                                             +---------+---------------+---------+-----------+----------+--------------+  POP       Full            Yes       Yes                                    +---------+---------------+---------+-----------+----------+--------------+  PTV       Full                                                             +---------+---------------+---------+-----------+----------+--------------+  PERO      Full                                                             +---------+---------------+---------+-----------+----------+--------------+     Summary: Right: There is no evidence of deep vein thrombosis in the lower extremity. No cystic structure found in the popliteal fossa. Ultrasound characteristics of enlarged lymph nodes are noted in the groin. Left: There is no evidence of deep vein thrombosis in the lower extremity. No cystic structure found in the popliteal fossa.  *See table(s)  above for measurements and observations. Electronically signed by Harold Barban MD on 07/09/2019 at 4:45:22 PM.    Final     Microbiology: Recent Results (from the past 240 hour(s))  Culture, blood (routine x 2)     Status: None   Collection Time: 07/20/19  1:16 PM   Specimen: BLOOD RIGHT HAND  Result Value Ref Range Status   Specimen Description BLOOD RIGHT HAND  Final   Special Requests   Final    BOTTLES DRAWN AEROBIC ONLY Blood Culture adequate volume   Culture   Final    NO GROWTH 5 DAYS Performed at [facility], [address], [place], [place] [address]    Report Status 07/25/2019 FINAL  Final  Culture, blood (routine x 2)     Status: None   Collection Time: 07/20/19  1:16 PM   Specimen: BLOOD RIGHT HAND  Result Value Ref Range Status   Specimen Description BLOOD RIGHT HAND  Final   Special Requests   Final    BOTTLES DRAWN AEROBIC ONLY Blood Culture adequate volume   Culture   Final    NO GROWTH 5 DAYS Performed at [facility], [place] [address], [place], [place] [address]    Report Status 07/25/2019 FINAL  Final     Labs: Basic Metabolic Panel: Recent Labs  Lab 07/22/19 0324 07/23/19 0428 07/24/19 0351 07/25/19 0321 07/26/19 0239  NA 130* 128* 131* 131* 135  K 4.0 4.2 4.0 4.2 3.8  CL 93* 91* 94* 94* 97*  CO2 25  23 26 24 24   GLUCOSE 173* 171* 166* 99 131*  BUN 73* 90* 54* 72* 47*  CREATININE 5.84* 6.33* 4.67* 5.82* 4.61*  CALCIUM 8.7* 8.7* 8.7* 9.1 8.8*   Liver Function Tests: No results for input(s): AST, ALT, ALKPHOS, BILITOT, PROT, ALBUMIN in the last 168 hours. No results for input(s): LIPASE, AMYLASE in the last 168 hours. No results for input(s): AMMONIA in the last 168 hours. CBC: Recent Labs  Lab 07/22/19 0324 07/23/19 0428 07/24/19 0351 07/25/19 0321 07/26/19 0239  WBC 16.1* 14.0* 14.1* 15.4* 15.1*  NEUTROABS 11.4* 9.6* 9.6* 11.0* 11.1*  HGB 8.2* 8.0* 8.5* 8.0* 8.7*  HCT 24.3* 25.5* 27.0* 25.3* 27.5*  MCV 93.5 97.0 97.1 97.3  95.5  PLT 449* 484* 512* 498* 549*   Cardiac Enzymes: No results for input(s): CKTOTAL, CKMB, CKMBINDEX, TROPONINI in the last 168 hours. BNP: BNP (last 3 results) Recent Labs    07/07/19 1057 07/08/19 0336 07/10/19 0301  BNP 360.3* 935.9* 195.8*    ProBNP (last 3 results) No results for input(s): PROBNP in the last 8760 hours.  CBG: Recent Labs  Lab 07/25/19 1152 07/25/19 1640 07/25/19 2206 07/26/19 0611 07/26/19 1105  GLUCAP 97 137* 113* 124* 108*    Principal Problem:   Sepsis (HCC) Active Problems:   CLL (chronic lymphocytic leukemia) (HCC)   Type 2 diabetes, HbA1c goal < 7% (HCC)   Acute renal failure superimposed on chronic kidney disease (HCC)   Acute on chronic combined systolic and diastolic CHF (congestive heart failure) (HCC)   Hypertensive urgency   Urinary tract infection   Abnormal EKG   Elevated troponin   Pressure injury of skin   Time coordinating discharge: 38 minutes.  Signed:        Cree Kunert, DO [facility]  07/26/2019, 11:57 AM

## 2019-07-26 NOTE — TOC Transition Note (Signed)
Transition of Care [facility]) - CM/SW Discharge Note Marvetta Gibbons RN, BSN Transitions of Care Unit 4E- RN Case Manager [phone]   Patient Details  Name: Ray Merritt MRN: [id number] Date of Birth: 10-27-1935  Transition of Care [facility]) CM/SW Contact:  Dawayne Patricia, RN Phone Number: 07/26/2019, 2:08 PM   Clinical Narrative:    Pt stable for transition home today, outpt HD spot has been secured- per [facility] renal navigator pt will need to be there in am at 27- spoke with daughter Ray Merritt- to confirm plans to return home with [place] confirms plan for home with [facility] and states she has all needed DME and plenty of assistance at home. She does want to speak with Jaclyn Shaggy regarding transportation to HD- msg has been given to [place] who will reach out to daughter. Plan will be for pt to transport home today via [facility]- will need GOLD DNR Form signed for transport- MD has been notified for form. Spoke with Butch Penny at [facility] regarding [place] referral and [place] needs- [facility] will reach out to daughter for [facility] scheduling of visits.    Final next level of care: [place] Barriers to Discharge: Barriers Resolved   Patient Goals and CMS Choice Patient states their goals for this hospitalization and ongoing recovery are:: return home and [place] at home CMS Medicare.gov Compare Post Acute Care list provided to:: Patient Represenative (must comment)(daughter) Choice offered to / list presented to : Adult Children(daughter)  Discharge Placement             Home with [facility]     Name of family member notified: Ray Merritt Patient and family notified of of transfer: 07/26/19  Discharge Plan and Services   Discharge Planning Services: CM Consult Post Acute Care Choice: Durable Medical Equipment, Home Health          DME Arranged: Wheelchair manual, Walker rolling, Hospital bed DME Agency: [employer] Date DME Agency Contacted: 07/18/19 Time DME Agency Contacted: 1210 Representative spoke  with at DME Agency: [place]: RN, PT, OT [facility] Agency: [place] ([place]) Date [place]: 07/26/19 Time [place]: 1407 Representative spoke with at [place]: [place] ([place]) Interventions     Readmission Risk Interventions Readmission Risk Prevention Plan 07/26/2019  Transportation Screening Complete  PCP or Specialist Appt within 3-5 Days Complete  HRI or [place] Complete  Social Work Consult for [place] Planning/Counseling [place] Not Applicable  Medication Review Press photographer) Complete  Some recent data might be hidden

## 2019-07-26 NOTE — Progress Notes (Signed)
[facility] Progress Note    Assessment/ Plan:   1. New ESRD was CKD5; CLIP is [facility] MWF 2nd shift. HD 07/23/2019, 1/4, next planned 1/6.    2. L RC AVF, infiltrated, with TEMP HD cath at current time not able to cannulate 12/28 or 12/30. Kaplan placed 07/24/18.   3. CAP, s/p antibiotics  4. HTN: BPs quite labile but some in the 120s.  No change to meds given concern for low BP and falls.   5. Anemia: Aranesp q Saturday; Hb in the 8s and stable.  Iron sat 15% on admit, IV iron load ordered (1st dose would be 1/6 so will need to cont outpt).   6. CKD BMD: PhosLo, follow with HD.  PTH 144, stable  7. Dispo: Ok to d/c from renal perspective now that [facility] in place.  L FA AVF will need to be reevaluated outpt.   Subjective:    Confused this morning.  No complaints.   Objective:   BP (!) 160/48 Comment: 160/48 at rest, 138/47 supine after sitting EOB  Pulse 62   Temp 99.1 F (37.3 C) (Oral)   Resp 20   Ht 6\' 3"  (1.905 m)   Wt 92 kg   SpO2 97%   BMI 25.35 kg/m   Intake/Output Summary (Last 24 hours) at 07/26/2019 1113 Last data filed at 07/25/2019 1547 Gross per 24 hour  Intake --  Output 2749 ml  Net -2749 ml   Weight change: 1.9 kg  Physical Exam: Gen: NAD CVS: RRR Resp: clear anteriorly Abd: nontender Ext: 1+ LE edema, improved  ACCESS: R IJ nontunneled HD cath, LUE AVF dressed > removed dressing and AVF with thrill and bruit but pulsatile.  He has 2 denuded areas over AVF as well ? Tape or cleaner reaction when AVF used?  Neuro: mumbling and disoriented  Imaging: IR Fluoro Guide CV Line Right  Result Date: 07/25/2019 INDICATION: History of end-stage renal disease, post placement of a temporary dialysis catheter on 07/14/2019. In need of durable intravenous access for the continuation dialysis. EXAM: FLUOROSCOPIC GUIDED CONVERSION OF NON TUNNELED TO TUNNELED CENTRAL VENOUS HEMODIALYSIS CATHETER COMPARISON:  Image guided placement of temporary hemodialysis  catheter-07/14/2019 MEDICATIONS: None ANESTHESIA/SEDATION: Moderate (conscious) sedation was employed during this procedure. A total of Versed 0.5 mg was administered intravenously. Moderate Sedation Time: 10 minutes. The patient's level of consciousness and vital signs were monitored continuously by radiology nursing throughout the procedure under my direct supervision. FLUOROSCOPY TIME:  30 seconds (4 mGy) COMPLICATIONS: None immediate. PROCEDURE: Informed written consent was obtained from the patient the patient's family after a discussion of the risks, benefits, and alternatives to treatment. Questions regarding the procedure were encouraged and answered. The skin and external portion of the existing hemodialysis catheter was prepped with chlorhexidine in a sterile fashion, and a sterile drape was applied covering the operative field. Maximum barrier sterile technique with sterile gowns and gloves were used for the procedure. A timeout was performed prior to the initiation of the procedure. A lumen of the none tunneled temporary dialysis catheter was cannulated with a stiff Glidewire in utilized for measurement purposes. Next, the Glidewire was advanced to the level of the IVC. A 19 cm tip to cuff palindrome dialysis catheter was tunneled from a site along the anterior chest to the venotomy site. Under intermittent fluoroscopic guidance, the temporary dialysis catheter was exchanged for a peel-away sheath. The tunneled dialysis catheter was inserted into the peel-away sheath with tips open terminating within in  the superior aspect the right atrium. Final catheter positioning was confirmed and documented with a spot fluoroscopic image. The catheter aspirates and flushes normally. The catheter was flushed with appropriate volume heparin dwells. The catheter exit site was secured with a 0-Prolene retention sutures. The venotomy site was apposed with derma bond Steri-Strips. Both lumens were heparinized. A dressing  was placed. The patient tolerated the procedure well without immediate post procedural complication. IMPRESSION: Successful fluoroscopic guided conversion of a temporary to a permanent 19 cm tip to cuff tunneled hemodialysis catheter with tips terminating within the right atrium. The catheter is ready for immediate use. Electronically Signed   By: Sandi Mariscal M.D.   On: 07/25/2019 13:07    Labs: BMET Recent Labs  Lab 07/20/19 0336 07/21/19 0354 07/22/19 0324 07/23/19 0428 07/24/19 0351 07/25/19 0321 07/26/19 0239  NA 130* 132* 130* 128* 131* 131* 135  K 3.8 3.7 4.0 4.2 4.0 4.2 3.8  CL 93* 94* 93* 91* 94* 94* 97*  CO2 24 25 25 23 26 24 24   GLUCOSE 218* 219* 173* 171* 166* 99 131*  BUN 76* 48* 73* 90* 54* 72* 47*  CREATININE 5.68* 4.88* 5.84* 6.33* 4.67* 5.82* 4.61*  CALCIUM 8.6* 8.3* 8.7* 8.7* 8.7* 9.1 8.8*   CBC Recent Labs  Lab 07/23/19 0428 07/24/19 0351 07/25/19 0321 07/26/19 0239  WBC 14.0* 14.1* 15.4* 15.1*  NEUTROABS 9.6* 9.6* 11.0* 11.1*  HGB 8.0* 8.5* 8.0* 8.7*  HCT 25.5* 27.0* 25.3* 27.5*  MCV 97.0 97.1 97.3 95.5  PLT 484* 512* 498* 549*    Medications:    . aspirin EC  81 mg Oral QHS  . atorvastatin  20 mg Oral Daily  . calcium acetate  1,334 mg Oral TID WC  . carvedilol  25 mg Oral BID WC  . Chlorhexidine Gluconate Cloth  6 each Topical Q0600  . cloNIDine  0.1 mg Oral BID  . darbepoetin (ARANESP) injection - DIALYSIS  150 mcg Intravenous Q Sat-HD  . finasteride  5 mg Oral QPM  . hydrALAZINE  100 mg Oral TID  . insulin aspart  0-9 Units Subcutaneous TID WC  . insulin glargine  6 Units Subcutaneous Daily  . isosorbide dinitrate  30 mg Oral TID  . multivitamin  1 tablet Oral QHS  . ranolazine  1,000 mg Oral BID  . tamsulosin  0.4 mg Oral QPC supper  . warfarin  7.5 mg Oral Once  . Warfarin - Pharmacist Dosing Inpatient   Does not apply [phone]

## 2019-07-26 NOTE — Evaluation (Signed)
Occupational Therapy Evaluation Patient Details Name: Ray Merritt MRN: [id number] DOB: 10-11-1935 Today's Date: 07/26/2019    History of Present Illness 84yo male c/o SOB with blood tinged sputum, nausea/vomiting. Covid negative x2, flu panel negative. Admitted with sepsis secondary to CAP, acute on chronic CHF. PMH DM, CAD, CKD, CA, hx shoulder surgery, cardac cath   Clinical Impression   Patient supine in bed. Requires +2 max assist for bed mobility today, sitting EOB with heavy L lateral and posterior lean ranging from min-mod assist +1 to mod-max assist +2 to maintain static sitting.  Not progressing towards goals today due to impaired cognition and generalized weakness, impaired balance. Disoriented to place, situation and time today (reports he is in [place] in [place]) with limited ability to follow simple 1 step commands given increased time.  Recommend increased support at home, and if not available will require SNF dc. Updated goals today, and will continue to follow acutely.     Follow Up Recommendations  SNF;Home health OT;Supervision/Assistance - 24 hour(SNF if does not have 24/7 physical support)    Equipment Recommendations  3 in 1 bedside commode;Hospital bed;Other (comment)(Hoyer Lift )    Recommendations for Other Services       Precautions / Restrictions Precautions Precautions: Fall Precaution Comments: watch vitals Restrictions Weight Bearing Restrictions: No      Mobility Bed Mobility Overal bed mobility: Needs Assistance Bed Mobility: Supine to Sit;Sit to Supine Rolling: Mod assist   Supine to sit: Max assist;+2 for physical assistance Sit to supine: Max assist;+2 for physical assistance   General bed mobility comments: patient able to initate movement of BLEs, but overall requires max assist for LEs, trunk and scooting foward  Transfers                      Balance Overall balance assessment: Needs assistance Sitting-balance support:  Bilateral upper extremity supported;Feet supported Sitting balance-Leahy Scale: Poor Sitting balance - Comments: fluctating between min-mod x 1 and max x 2 with heavy L lateral and posterior lean, and inconsistent LOB throwing himself back towards the bed Postural control: Left lateral lean;Posterior lean                                 ADL either performed or assessed with clinical judgement   ADL Overall ADL's : Needs assistance/impaired     Grooming: Set up;Bed level;Wash/dry face               Lower Body Dressing: Total assistance Lower Body Dressing Details (indicate cue type and reason): requires total assist for socks today             Functional mobility during ADLs: Moderate assistance;Maximal assistance;+2 for physical assistance;+2 for safety/equipment General ADL Comments: limited to EOB only, pt limited by cognition, dizziness and weakness      Vision         Perception     Praxis      Pertinent Vitals/Pain Pain Assessment: No/denies pain     Hand Dominance     Extremity/Trunk Assessment Upper Extremity Assessment Upper Extremity Assessment: Generalized weakness           Communication     Cognition Arousal/Alertness: Awake/alert(somewhat lethargic but improves with engagement) Behavior During Therapy: Flat affect Overall Cognitive Status: Impaired/Different from baseline Area of Impairment: Orientation;Attention;Memory;Following commands;Safety/judgement;Awareness;Problem solving  Orientation Level: Disoriented to;Place;Time;Situation Current Attention Level: Focused Memory: Decreased recall of precautions;Decreased short-term memory Following Commands: Follows one step commands inconsistently;Follows one step commands with increased time Safety/Judgement: Decreased awareness of safety;Decreased awareness of deficits Awareness: Intellectual Problem Solving: Slow processing;Decreased  initiation;Difficulty sequencing;Requires verbal cues;Requires tactile cues General Comments: patient oriented to self today, reports in [place] at a club in 1921.  He follows 1 step commands inconsistently with increased time, poor awareness to situation and deficits    General Comments  Pt on RA, reports dizziness when asked at EOB with BP initally 160/47 supine and 114/48 after laying back down     Exercises     Shoulder Instructions      Home Living                                          Prior Functioning/Environment                   OT Problem List:        OT Treatment/Interventions:      OT Goals(Current goals can be found in the care plan section) Acute Rehab OT Goals Patient Stated Goal: get stronger, go home OT Goal Formulation: With patient Time For Goal Achievement: 08/09/19 Potential to Achieve Goals: Good  OT Frequency: Min 2X/week   Barriers to D/C:            Co-evaluation PT/OT/SLP Co-Evaluation/Treatment: Yes Reason for Co-Treatment: For patient/therapist safety;To address functional/ADL transfers;Necessary to address cognition/behavior during functional activity   OT goals addressed during session: ADL's and self-care      AM-PAC OT "6 Clicks" Daily Activity     Outcome Measure Help from another person eating meals?: A Little Help from another person taking care of personal grooming?: A Lot Help from another person toileting, which includes using toliet, bedpan, or urinal?: Total Help from another person bathing (including washing, rinsing, drying)?: Total Help from another person to put on and taking off regular upper body clothing?: A Lot Help from another person to put on and taking off regular lower body clothing?: Total 6 Click Score: 10   End of Session Nurse Communication: Mobility status;Other (comment)(cognition, status )  Activity Tolerance: Patient tolerated treatment well Patient left: in bed;with call  bell/phone within reach;with bed alarm set;Other (comment)(chair position in bed)  OT Visit Diagnosis: Unsteadiness on feet (R26.81);Other abnormalities of gait and mobility (R26.89);Muscle weakness (generalized) (M62.81)                Time: 6213-0865 OT Time Calculation (min): 26 min Charges:  OT General Charges $OT Visit: 1 Visit OT Treatments $Self Care/Home Management : 8-22 mins  [place] Pager [phone] Office [phone]   Delight Stare 07/26/2019, 10:24 AM

## 2019-07-26 NOTE — Progress Notes (Signed)
Spoke to [facility] and MD regarding discharge plan. Patient cleared for discharge today, per MD. Therefore, patient will start in the OP HD clinic/[facility] tomorrow, MWF 12:35pm. He needs to arrive to the clinic tomorrow at 11:30am to complete paperwork prior to his first treatment.  Renal Navigator notified OP HD clinic that patient will start in clinic tomorrow, 07/27/19.

## 2019-07-26 NOTE — Progress Notes (Signed)
Physical Therapy Treatment Patient Details Name: Ray Merritt AGE MRN: [id number] DOB: [birth year] Today's Date: 07/26/2019    History of Present Illness 84yo male c/o SOB with blood tinged sputum, nausea/vomiting. Covid negative x2, flu panel negative. Admitted with sepsis secondary to CAP, acute on chronic CHF. PMH DM, CAD, CKD, CA, hx shoulder surgery, cardac cath    PT Comments    Pt in bed upon arrival of PT/OT, agreeable to session this am, but demos sig increased confusion through session compared to what has been reported in prior notes. The pt was only able to intermittently follow commands, and was only oriented to person (see cognition section for more details). The pt was able to transfer to sitting EOB with maxA of 2, and then required min-maxA to maintain sitting balance as he was impulsively lying himself back down in bed throughout the session. The pt reported dizziness, so BP was taken in supine position after returning to bed. BP prior to therapy was 160/48 (supine) and BP after activity was 114/47 (supine). The pt will continue to benefit from skilled PT to maximize independence and functional mobility prior to d/c.      Follow Up Recommendations  SNF;Supervision/Assistance - 24 hour(pt and family prefer d/c home, but pt unsafe to do so at this time, especialy given worsening confusion)     Equipment Recommendations  Rolling walker with 5" wheels;Wheelchair (measurements PT);Wheelchair cushion (measurements PT);Hospital bed;Other (comment)(hoyer lift (if going home))    Recommendations for Other Services       Precautions / Restrictions Precautions Precautions: Fall Precaution Comments: watch vitals Restrictions Weight Bearing Restrictions: No    Mobility  Bed Mobility Overal bed mobility: Needs Assistance Bed Mobility: Supine to Sit;Sit to Supine Rolling: Mod assist   Supine to sit: Modified independent (Device/Increase time) Sit to supine: Max assist;+2 for  physical assistance   General bed mobility comments: patient able to initate movement of BLEs, but overall requires max assist for LEs, trunk and scooting foward  Transfers Overall transfer level: Needs assistance   Transfers: Lateral/Scoot Transfers(attempted today, but pt unable even with heavy max, not attempted due to added barrier of cognition)              Ambulation/Gait             General Gait Details: unable   Stairs             Wheelchair Mobility    Modified Rankin (Stroke Patients Only)       Balance Overall balance assessment: Needs assistance Sitting-balance support: Bilateral upper extremity supported;Feet supported Sitting balance-Leahy Scale: Poor Sitting balance - Comments: fluctating between min-mod x 1 and max x 2 with heavy L lateral and posterior lean, and inconsistent LOB throwing himself back towards the bed Postural control: Left lateral lean;Posterior lean   Standing balance-Leahy Scale: Zero Standing balance comment: unable                            Cognition Arousal/Alertness: Awake/alert(lethargic initially, improves with activity) Behavior During Therapy: Flat affect;Impulsive(pt impulsively laying back into bed without warning, reports arm is fatigued) Overall Cognitive Status: Impaired/Different from baseline Area of Impairment: Orientation;Attention;Memory;Following commands;Safety/judgement;Awareness;Problem solving                 Orientation Level: Disoriented to;Place;Time;Situation(pt reports he is at a club in [place] and it is december 1921) Current Attention Level: Focused Memory: Decreased recall of precautions;Decreased short-term memory Following  Commands: Follows one step commands inconsistently;Follows one step commands with increased time(sig difficulty with movement of BLE on command, able to follow general comands such as "sit up") Safety/Judgement: Decreased awareness of safety;Decreased  awareness of deficits Awareness: Intellectual Problem Solving: Slow processing;Decreased initiation;Difficulty sequencing;Requires verbal cues;Requires tactile cues General Comments: patient oriented to self today, reports in [place] at a club in 1921.  He follows 1 step commands inconsistently with increased time, poor awareness to situation and deficits       Exercises General Exercises - Lower Extremity Ankle Circles/Pumps: AROM;Both;10 reps Straight Leg Raises: AAROM;Both;5 reps    General Comments General comments (skin integrity, edema, etc.): Pt on RA, reports dizziness when asked at EOB with BP initally 160/47 supine and 114/48 after laying back down      Pertinent Vitals/Pain Pain Assessment: Faces Faces Pain Scale: Hurts a little bit Pain Location: L UE Pain Descriptors / Indicators: Guarding;Sore Pain Intervention(s): Limited activity within patient's tolerance;Monitored during session;Repositioned    Home Living                      Prior Function            PT Goals (current goals can now be found in the care plan section) Acute Rehab PT Goals Patient Stated Goal: get stronger, go home PT Goal Formulation: With patient Time For Goal Achievement: 08/09/19 Potential to Achieve Goals: Fair Progress towards PT goals: Progressing toward goals    Frequency           PT Plan Current plan remains appropriate    Co-evaluation PT/OT/SLP Co-Evaluation/Treatment: Yes Reason for Co-Treatment: Complexity of the patient's impairments (multi-system involvement);Necessary to address cognition/behavior during functional activity;To address functional/ADL transfers PT goals addressed during session: Mobility/safety with mobility;Balance OT goals addressed during session: ADL's and self-care      AM-PAC PT "6 Clicks" Mobility   Outcome Measure  Help needed turning from your back to your side while in a flat bed without using bedrails?: A Lot Help needed  moving from lying on your back to sitting on the side of a flat bed without using bedrails?: A Lot Help needed moving to and from a bed to a chair (including a wheelchair)?: Total Help needed standing up from a chair using your arms (e.g., wheelchair or bedside chair)?: Total Help needed to walk in hospital room?: Total Help needed climbing 3-5 steps with a railing? : Total 6 Click Score: 8    End of Session   Activity Tolerance: Other (comment)(limited by dizziness, confusion, no reports of change in pain) Patient left: in bed;with call bell/phone within reach;with bed alarm set(chair position) Nurse Communication: Mobility status;Other (comment)(cognition) PT Visit Diagnosis: Unsteadiness on feet (R26.81);Muscle weakness (generalized) (M62.81);Difficulty in walking, not elsewhere classified (R26.2)     Time: 0086-7619 PT Time Calculation (min) (ACUTE ONLY): 26 min  Charges:  $Therapeutic Activity: 8-22 mins                     Karma Ganja, PT, DPT   Acute Rehabilitation Department [phone]   Otho Bellows 07/26/2019, 11:22 AM

## 2019-07-26 NOTE — Progress Notes (Signed)
ANTICOAGULATION CONSULT NOTE - Follow Up Consult  Pharmacy Consult for Coumadin Indication: atrial fibrillation  No Known Allergies  Patient Measurements: Height: 6\' 3"  (190.5 cm) Weight: 202 lb 13.2 oz (92 kg) IBW/kg (Calculated) : 84.5  Vital Signs: Temp: 99 F (37.2 C) (01/05 0814) Temp Source: Oral (01/05 0814) BP: 160/47 (01/05 0814) Pulse Rate: 70 (01/05 0814)  Labs: Recent Labs    07/24/19 0351 07/25/19 0321 07/26/19 0239  HGB 8.5* 8.0* 8.7*  HCT 27.0* 25.3* 27.5*  PLT 512* 498* 549*  LABPROT 17.4* 18.0* 18.5*  INR 1.4* 1.5* 1.6*  CREATININE 4.67* 5.82* 4.61*    Estimated Creatinine Clearance: 15 mL/min (A) (by C-G formula based on SCr of 4.61 mg/dL (H)).  Assessment:  Anticoag: Warf for new onset AFib - no AC PTA.  INR 1.6 today. CBC stable. No reported bleeding. CHA2DS2-VASc score 6 - No desire to bridge per [place]. Not in Afib   Goal of Therapy:  INR 2-3 Monitor platelets by anticoagulation protocol: Yes   Plan:  Coumadin 7.5mg  po x 1 tonight. Would recommend home on 5mg  daily. Daily PT/INR   Orlando Devereux S. Alford Highland, PharmD, BCPS Clinical Staff Pharmacist [email or web address] Alford Highland, [employer] 07/26/2019,10:24 AM

## 2019-07-26 NOTE — Progress Notes (Signed)
[facility] called for transport, paperwork placed on shadow chart for transportation needs. Daughter called to update on timing of transport home. Bedside RN aware that [facility] has been called.

## 2019-07-26 NOTE — Progress Notes (Signed)
Renal Navigator contacted patient's daughter/Mellanie to discuss OP HD treatment and transportation. She is unsure if she will be able to get patient in to her vehicle on an ongoing basis due to his degree of debility at this point. She wants to be the one to transport patient to HD for his first day tomorrow, but asks for assistance moving forward. She states she is certain that she, her husband and two adult children will be able to get him in to the car tomorrow and Friday if needed. Navigator ensured that patient's daughter can get him in to a wheelchair and out of the house. She states she can use the ramp for the lawn mower at this point, but that she has already contacted a contractor to build a permanent ramp. Renal Navigator informed her of [facility] transportation and completed the application. Renal Navigator scanned and emailed the signature pages to her, but she did not sign and send back as instructed. Navigator will reach out to [facility] to assist with completing application and submitting to [employer].   Alphonzo Cruise, [place] Renal Navigator [phone]

## 2019-07-27 ENCOUNTER — Telehealth: Payer: Self-pay | Admitting: Nephrology

## 2019-07-27 DIAGNOSIS — N186 End stage renal disease: Secondary | ICD-10-CM | POA: Diagnosis not present

## 2019-07-27 DIAGNOSIS — A419 Sepsis, unspecified organism: Secondary | ICD-10-CM | POA: Diagnosis not present

## 2019-07-27 DIAGNOSIS — E118 Type 2 diabetes mellitus with unspecified complications: Secondary | ICD-10-CM | POA: Diagnosis not present

## 2019-07-27 DIAGNOSIS — Z992 Dependence on renal dialysis: Secondary | ICD-10-CM | POA: Diagnosis not present

## 2019-07-27 DIAGNOSIS — D631 Anemia in chronic kidney disease: Secondary | ICD-10-CM | POA: Diagnosis not present

## 2019-07-27 DIAGNOSIS — N2581 Secondary hyperparathyroidism of renal origin: Secondary | ICD-10-CM | POA: Diagnosis not present

## 2019-07-27 DIAGNOSIS — D509 Iron deficiency anemia, unspecified: Secondary | ICD-10-CM | POA: Diagnosis not present

## 2019-07-27 NOTE — Telephone Encounter (Signed)
Transition of care contact from inpatient facility  Date of Discharge: 07/26/2019  Date of Contact: 07/27/2019 - attempted Method of contact: Phone  Attempted to contact patient to discuss transition of carefrom inpatient admission. Patient did not answer the phone. Message was left on the patient's voicemail with call back number [phone].  Veneta Penton, PA-C [employer] Pager [phone]

## 2019-07-28 DIAGNOSIS — J189 Pneumonia, unspecified organism: Secondary | ICD-10-CM | POA: Diagnosis not present

## 2019-07-28 DIAGNOSIS — Z743 Need for continuous supervision: Secondary | ICD-10-CM | POA: Diagnosis not present

## 2019-07-28 DIAGNOSIS — I499 Cardiac arrhythmia, unspecified: Secondary | ICD-10-CM | POA: Diagnosis not present

## 2019-08-22 DEATH — deceased

## 2019-08-26 DIAGNOSIS — A419 Sepsis, unspecified organism: Secondary | ICD-10-CM | POA: Diagnosis not present

## 2019-11-10 ENCOUNTER — Other Ambulatory Visit: Payer: Medicare Other

## 2020-03-07 ENCOUNTER — Ambulatory Visit: Payer: Medicare Other | Admitting: Internal Medicine

## 2020-03-07 ENCOUNTER — Other Ambulatory Visit: Payer: Medicare Other

## 2020-07-04 ENCOUNTER — Other Ambulatory Visit: Payer: Medicare Other

## 2020-07-04 ENCOUNTER — Ambulatory Visit: Payer: Medicare Other | Admitting: Internal Medicine

## 2020-08-31 IMAGING — US IR FLUORO GUIDE CV LINE*R*
1 series · 1 of 1 positions shown · non-contrast
Comparison: none

CLINICAL DATA: Renal failure, needs access for hemodialysis. On
Coumadin.

EXAM:
EXAM
RIGHT IJ CATHETER PLACEMENT UNDER ULTRASOUND AND FLUOROSCOPIC
GUIDANCE
TECHNIQUE: The procedure, risks (including but not limited to bleeding,
infection, organ damage, pneumothorax), benefits, and alternatives
were explained to the family. Questions regarding the procedure were
encouraged and answered. The family understands and consents to the
procedure. Patency of the right IJ vein was confirmed with
ultrasound with image documentation. An appropriate skin site was
determined. Skin site was marked. Region was prepped using maximum
barrier technique including cap and mask, sterile gown, sterile
gloves, large sterile sheet, and Chlorhexidine as cutaneous
antisepsis. The region was infiltrated locally with 1% lidocaine.
Under real-time ultrasound guidance, the right IJ vein was accessed
with a 21 gauge needle; the needle tip within the vein was confirmed
with ultrasound image documentation. The needle exchanged over a 018
guidewire for vascular dilator which allowed advancement of a 20 cm
Mahurkar catheter. This was positioned with the tip at the
cavoatrial junction. Spot chest radiograph shows good positioning
and no pneumothorax. Catheter was flushed and sutured externally
with 0-Prolene sutures. Patient tolerated the procedure well.
FLUOROSCOPY TIME:  0.2 minute; 23 uCymT DAP
COMPLICATIONS:
COMPLICATIONS
none

[Series 1: ir fluoro guide cv line*right* · 1 of 1 slices shown]
[im 1/1]
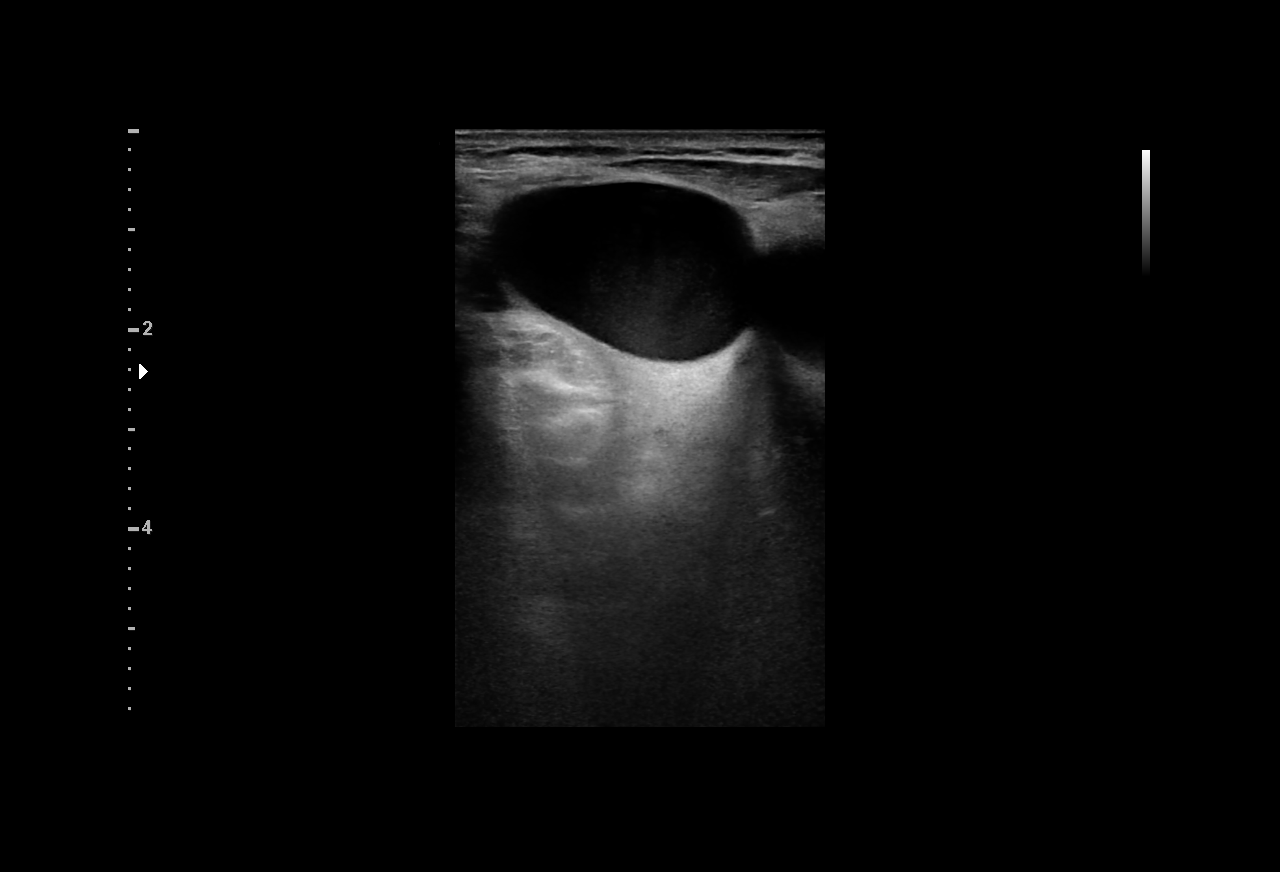

[1 of 1 positions shown; findings below may reference images not displayed]

IMPRESSION: 1. Technically successful right IJ Mahurkar catheter placement.
